# Patient Record
Sex: Female | Born: 1950
Health system: Southern US, Community
[De-identification: ages and names within clinical notes are randomized; demographics above are authoritative.]

## PROBLEM LIST (undated history)

## (undated) ENCOUNTER — Emergency Department (HOSPITAL_COMMUNITY): Admission: EM

## (undated) DIAGNOSIS — T7840XA Allergy, unspecified, initial encounter: Secondary | ICD-10-CM

## (undated) DIAGNOSIS — G43909 Migraine, unspecified, not intractable, without status migrainosus: Secondary | ICD-10-CM

## (undated) DIAGNOSIS — J45909 Unspecified asthma, uncomplicated: Secondary | ICD-10-CM

## (undated) DIAGNOSIS — I10 Essential (primary) hypertension: Secondary | ICD-10-CM

## (undated) DIAGNOSIS — M159 Polyosteoarthritis, unspecified: Secondary | ICD-10-CM

## (undated) DIAGNOSIS — H409 Unspecified glaucoma: Secondary | ICD-10-CM

## (undated) DIAGNOSIS — J479 Bronchiectasis, uncomplicated: Secondary | ICD-10-CM

## (undated) DIAGNOSIS — K219 Gastro-esophageal reflux disease without esophagitis: Secondary | ICD-10-CM

## (undated) DIAGNOSIS — D573 Sickle-cell trait: Secondary | ICD-10-CM

## (undated) DIAGNOSIS — H269 Unspecified cataract: Secondary | ICD-10-CM

## (undated) DIAGNOSIS — Z86010 Personal history of colonic polyps: Secondary | ICD-10-CM

## (undated) DIAGNOSIS — R809 Proteinuria, unspecified: Secondary | ICD-10-CM

## (undated) DIAGNOSIS — E663 Overweight: Secondary | ICD-10-CM

## (undated) DIAGNOSIS — J302 Other seasonal allergic rhinitis: Secondary | ICD-10-CM

## (undated) DIAGNOSIS — L509 Urticaria, unspecified: Secondary | ICD-10-CM

## (undated) DIAGNOSIS — N399 Disorder of urinary system, unspecified: Secondary | ICD-10-CM

## (undated) DIAGNOSIS — H35033 Hypertensive retinopathy, bilateral: Secondary | ICD-10-CM

## (undated) DIAGNOSIS — Z227 Latent tuberculosis: Secondary | ICD-10-CM

## (undated) DIAGNOSIS — E1129 Type 2 diabetes mellitus with other diabetic kidney complication: Secondary | ICD-10-CM

## (undated) DIAGNOSIS — L280 Lichen simplex chronicus: Secondary | ICD-10-CM

## (undated) DIAGNOSIS — F17200 Nicotine dependence, unspecified, uncomplicated: Secondary | ICD-10-CM

## (undated) DIAGNOSIS — Z720 Tobacco use: Secondary | ICD-10-CM

## (undated) DIAGNOSIS — D571 Sickle-cell disease without crisis: Secondary | ICD-10-CM

## (undated) DIAGNOSIS — J3089 Other allergic rhinitis: Secondary | ICD-10-CM

## (undated) DIAGNOSIS — D128 Benign neoplasm of rectum: Secondary | ICD-10-CM

## (undated) DIAGNOSIS — G5621 Lesion of ulnar nerve, right upper limb: Secondary | ICD-10-CM

## (undated) HISTORY — DX: Latent tuberculosis: Z22.7

## (undated) HISTORY — DX: Unspecified glaucoma: H40.9

## (undated) HISTORY — DX: Benign neoplasm of rectum: D12.8

## (undated) HISTORY — DX: Other allergic rhinitis: J30.89

## (undated) HISTORY — PX: CATARACT EXTRACTION, BILATERAL: SHX1313

## (undated) HISTORY — PX: TUBAL LIGATION: SHX77

## (undated) HISTORY — DX: Allergy, unspecified, initial encounter: T78.40XA

## (undated) HISTORY — PX: APPENDECTOMY: SHX54

## (undated) HISTORY — DX: Personal history of colonic polyps: Z86.010

## (undated) HISTORY — DX: Overweight: E66.3

## (undated) HISTORY — DX: Unspecified cataract: H26.9

## (undated) HISTORY — DX: Essential (primary) hypertension: I10

## (undated) HISTORY — DX: Urticaria, unspecified: L50.9

## (undated) HISTORY — DX: Sickle-cell disease without crisis: D57.1

## (undated) HISTORY — DX: Polyosteoarthritis, unspecified: M15.9

## (undated) HISTORY — DX: Unspecified asthma, uncomplicated: J45.909

## (undated) HISTORY — PX: TONSILLECTOMY: SUR1361

## (undated) HISTORY — DX: Gastro-esophageal reflux disease without esophagitis: K21.9

## (undated) HISTORY — DX: Hypertensive retinopathy, bilateral: H35.033

## (undated) HISTORY — DX: Lesion of ulnar nerve, right upper limb: G56.21

## (undated) HISTORY — DX: Bronchiectasis, uncomplicated: J47.9

## (undated) HISTORY — DX: Proteinuria, unspecified: R80.9

## (undated) HISTORY — DX: Tobacco use: Z72.0

## (undated) HISTORY — DX: Other seasonal allergic rhinitis: J30.2

## (undated) HISTORY — DX: Sickle-cell trait: D57.3

## (undated) HISTORY — DX: Type 2 diabetes mellitus with other diabetic kidney complication: E11.29

## (undated) HISTORY — DX: Disorder of urinary system, unspecified: N39.9

## (undated) HISTORY — DX: Migraine, unspecified, not intractable, without status migrainosus: G43.909

## (undated) HISTORY — DX: Lichen simplex chronicus: L28.0

---

## 1997-10-12 ENCOUNTER — Inpatient Hospital Stay (HOSPITAL_COMMUNITY): Admission: AD | Admit: 1997-10-12 | Discharge: 1997-10-12 | Payer: Self-pay | Admitting: Obstetrics & Gynecology

## 1998-08-28 ENCOUNTER — Ambulatory Visit (HOSPITAL_COMMUNITY): Admission: RE | Admit: 1998-08-28 | Discharge: 1998-08-28 | Payer: Self-pay | Admitting: Family Medicine

## 1998-10-11 ENCOUNTER — Ambulatory Visit (HOSPITAL_COMMUNITY): Admission: RE | Admit: 1998-10-11 | Discharge: 1998-10-11 | Payer: Self-pay | Admitting: Obstetrics and Gynecology

## 1999-04-11 ENCOUNTER — Emergency Department (HOSPITAL_COMMUNITY): Admission: EM | Admit: 1999-04-11 | Discharge: 1999-04-11 | Payer: Self-pay | Admitting: Emergency Medicine

## 1999-04-11 ENCOUNTER — Encounter: Payer: Self-pay | Admitting: Emergency Medicine

## 1999-10-10 ENCOUNTER — Emergency Department (HOSPITAL_COMMUNITY): Admission: EM | Admit: 1999-10-10 | Discharge: 1999-10-10 | Payer: Self-pay | Admitting: Emergency Medicine

## 1999-10-16 ENCOUNTER — Ambulatory Visit (HOSPITAL_COMMUNITY): Admission: RE | Admit: 1999-10-16 | Discharge: 1999-10-16 | Payer: Self-pay | Admitting: Family Medicine

## 1999-12-10 ENCOUNTER — Emergency Department (HOSPITAL_COMMUNITY): Admission: EM | Admit: 1999-12-10 | Discharge: 1999-12-10 | Payer: Self-pay

## 1999-12-10 ENCOUNTER — Encounter: Payer: Self-pay | Admitting: Emergency Medicine

## 2000-08-05 ENCOUNTER — Encounter: Payer: Self-pay | Admitting: Pulmonary Disease

## 2000-08-05 ENCOUNTER — Ambulatory Visit (HOSPITAL_COMMUNITY): Admission: RE | Admit: 2000-08-05 | Discharge: 2000-08-05 | Payer: Self-pay | Admitting: Pulmonary Disease

## 2001-02-02 ENCOUNTER — Emergency Department (HOSPITAL_COMMUNITY): Admission: EM | Admit: 2001-02-02 | Discharge: 2001-02-03 | Payer: Self-pay | Admitting: Emergency Medicine

## 2001-10-14 ENCOUNTER — Encounter: Admission: RE | Admit: 2001-10-14 | Discharge: 2001-10-14 | Payer: Self-pay | Admitting: Obstetrics and Gynecology

## 2001-10-14 ENCOUNTER — Encounter (INDEPENDENT_AMBULATORY_CARE_PROVIDER_SITE_OTHER): Payer: Self-pay | Admitting: *Deleted

## 2002-06-16 ENCOUNTER — Encounter: Admission: RE | Admit: 2002-06-16 | Discharge: 2002-06-16 | Payer: Self-pay | Admitting: Internal Medicine

## 2002-10-05 ENCOUNTER — Ambulatory Visit (HOSPITAL_COMMUNITY): Admission: RE | Admit: 2002-10-05 | Discharge: 2002-10-05 | Payer: Self-pay | Admitting: Internal Medicine

## 2002-10-05 ENCOUNTER — Encounter: Payer: Self-pay | Admitting: Internal Medicine

## 2002-10-05 ENCOUNTER — Encounter: Admission: RE | Admit: 2002-10-05 | Discharge: 2002-10-05 | Payer: Self-pay | Admitting: Internal Medicine

## 2002-10-17 ENCOUNTER — Encounter: Admission: RE | Admit: 2002-10-17 | Discharge: 2002-10-17 | Payer: Self-pay | Admitting: Internal Medicine

## 2002-10-31 ENCOUNTER — Encounter: Admission: RE | Admit: 2002-10-31 | Discharge: 2002-10-31 | Payer: Self-pay | Admitting: Internal Medicine

## 2003-01-04 ENCOUNTER — Encounter: Admission: RE | Admit: 2003-01-04 | Discharge: 2003-01-04 | Payer: Self-pay | Admitting: Infectious Diseases

## 2003-02-10 ENCOUNTER — Encounter: Admission: RE | Admit: 2003-02-10 | Discharge: 2003-02-10 | Payer: Self-pay | Admitting: Internal Medicine

## 2003-03-07 ENCOUNTER — Encounter: Admission: RE | Admit: 2003-03-07 | Discharge: 2003-06-05 | Payer: Self-pay | Admitting: Internal Medicine

## 2003-03-09 ENCOUNTER — Encounter: Admission: RE | Admit: 2003-03-09 | Discharge: 2003-03-09 | Payer: Self-pay | Admitting: Internal Medicine

## 2003-03-10 ENCOUNTER — Ambulatory Visit (HOSPITAL_COMMUNITY): Admission: RE | Admit: 2003-03-10 | Discharge: 2003-03-10 | Payer: Self-pay | Admitting: *Deleted

## 2003-03-10 ENCOUNTER — Encounter: Payer: Self-pay | Admitting: Nephrology

## 2003-04-11 ENCOUNTER — Emergency Department (HOSPITAL_COMMUNITY): Admission: EM | Admit: 2003-04-11 | Discharge: 2003-04-12 | Payer: Self-pay | Admitting: Emergency Medicine

## 2003-05-01 ENCOUNTER — Encounter: Admission: RE | Admit: 2003-05-01 | Discharge: 2003-05-01 | Payer: Self-pay | Admitting: Internal Medicine

## 2003-12-06 ENCOUNTER — Emergency Department (HOSPITAL_COMMUNITY): Admission: EM | Admit: 2003-12-06 | Discharge: 2003-12-06 | Payer: Self-pay | Admitting: Family Medicine

## 2003-12-14 ENCOUNTER — Encounter: Admission: RE | Admit: 2003-12-14 | Discharge: 2003-12-14 | Payer: Self-pay | Admitting: Internal Medicine

## 2003-12-22 ENCOUNTER — Encounter: Admission: RE | Admit: 2003-12-22 | Discharge: 2003-12-22 | Payer: Self-pay | Admitting: Internal Medicine

## 2004-01-01 ENCOUNTER — Emergency Department (HOSPITAL_COMMUNITY): Admission: EM | Admit: 2004-01-01 | Discharge: 2004-01-01 | Payer: Self-pay | Admitting: Family Medicine

## 2004-01-05 ENCOUNTER — Encounter: Admission: RE | Admit: 2004-01-05 | Discharge: 2004-01-05 | Payer: Self-pay | Admitting: Internal Medicine

## 2004-02-05 ENCOUNTER — Ambulatory Visit: Payer: Self-pay | Admitting: Internal Medicine

## 2004-02-21 ENCOUNTER — Ambulatory Visit: Payer: Self-pay | Admitting: Internal Medicine

## 2004-03-04 ENCOUNTER — Ambulatory Visit: Payer: Self-pay | Admitting: Internal Medicine

## 2004-03-11 ENCOUNTER — Ambulatory Visit (HOSPITAL_COMMUNITY): Admission: RE | Admit: 2004-03-11 | Discharge: 2004-03-11 | Payer: Self-pay | Admitting: Internal Medicine

## 2004-05-13 ENCOUNTER — Ambulatory Visit: Payer: Self-pay | Admitting: Internal Medicine

## 2004-07-11 ENCOUNTER — Ambulatory Visit: Payer: Self-pay | Admitting: Internal Medicine

## 2004-08-21 ENCOUNTER — Ambulatory Visit: Payer: Self-pay | Admitting: Internal Medicine

## 2004-08-23 ENCOUNTER — Ambulatory Visit (HOSPITAL_COMMUNITY): Admission: RE | Admit: 2004-08-23 | Discharge: 2004-08-23 | Payer: Self-pay | Admitting: Internal Medicine

## 2004-08-30 ENCOUNTER — Ambulatory Visit: Payer: Self-pay | Admitting: Sports Medicine

## 2004-09-11 ENCOUNTER — Ambulatory Visit: Payer: Self-pay | Admitting: Internal Medicine

## 2004-10-28 ENCOUNTER — Ambulatory Visit: Payer: Self-pay | Admitting: Internal Medicine

## 2004-11-07 ENCOUNTER — Ambulatory Visit: Payer: Self-pay | Admitting: Internal Medicine

## 2004-11-11 ENCOUNTER — Emergency Department (HOSPITAL_COMMUNITY): Admission: EM | Admit: 2004-11-11 | Discharge: 2004-11-11 | Payer: Self-pay | Admitting: Emergency Medicine

## 2005-02-20 ENCOUNTER — Ambulatory Visit: Payer: Self-pay | Admitting: Internal Medicine

## 2005-04-10 ENCOUNTER — Ambulatory Visit: Payer: Self-pay | Admitting: Internal Medicine

## 2005-06-16 ENCOUNTER — Ambulatory Visit: Payer: Self-pay | Admitting: Hospitalist

## 2005-06-16 ENCOUNTER — Encounter (INDEPENDENT_AMBULATORY_CARE_PROVIDER_SITE_OTHER): Payer: Self-pay | Admitting: Infectious Diseases

## 2005-06-23 ENCOUNTER — Ambulatory Visit: Payer: Self-pay | Admitting: Internal Medicine

## 2005-07-09 ENCOUNTER — Encounter: Admission: RE | Admit: 2005-07-09 | Discharge: 2005-08-01 | Payer: Self-pay | Admitting: Orthopedic Surgery

## 2005-07-21 ENCOUNTER — Ambulatory Visit: Payer: Self-pay | Admitting: Internal Medicine

## 2005-08-27 ENCOUNTER — Ambulatory Visit (HOSPITAL_COMMUNITY): Admission: RE | Admit: 2005-08-27 | Discharge: 2005-08-27 | Payer: Self-pay | Admitting: Emergency Medicine

## 2005-09-19 ENCOUNTER — Ambulatory Visit: Payer: Self-pay | Admitting: Internal Medicine

## 2005-09-19 ENCOUNTER — Ambulatory Visit (HOSPITAL_COMMUNITY): Admission: RE | Admit: 2005-09-19 | Discharge: 2005-09-19 | Payer: Self-pay | Admitting: Internal Medicine

## 2005-09-19 ENCOUNTER — Encounter (INDEPENDENT_AMBULATORY_CARE_PROVIDER_SITE_OTHER): Payer: Self-pay | Admitting: *Deleted

## 2005-11-07 ENCOUNTER — Ambulatory Visit: Payer: Self-pay | Admitting: Internal Medicine

## 2006-03-11 ENCOUNTER — Ambulatory Visit: Payer: Self-pay | Admitting: Internal Medicine

## 2006-03-18 ENCOUNTER — Ambulatory Visit: Payer: Self-pay | Admitting: Internal Medicine

## 2006-03-18 ENCOUNTER — Encounter (INDEPENDENT_AMBULATORY_CARE_PROVIDER_SITE_OTHER): Payer: Self-pay | Admitting: Infectious Diseases

## 2006-03-18 LAB — CONVERTED CEMR LAB
Chloride: 105 meq/L (ref 96–112)
Potassium: 4.2 meq/L (ref 3.5–5.3)
Sodium: 136 meq/L (ref 135–145)

## 2006-05-15 ENCOUNTER — Ambulatory Visit: Payer: Self-pay | Admitting: Internal Medicine

## 2006-05-21 ENCOUNTER — Encounter (INDEPENDENT_AMBULATORY_CARE_PROVIDER_SITE_OTHER): Payer: Self-pay | Admitting: Infectious Diseases

## 2006-05-21 DIAGNOSIS — E1129 Type 2 diabetes mellitus with other diabetic kidney complication: Secondary | ICD-10-CM

## 2006-05-21 DIAGNOSIS — M159 Polyosteoarthritis, unspecified: Secondary | ICD-10-CM

## 2006-05-21 DIAGNOSIS — IMO0002 Reserved for concepts with insufficient information to code with codable children: Secondary | ICD-10-CM | POA: Insufficient documentation

## 2006-05-21 DIAGNOSIS — R809 Proteinuria, unspecified: Secondary | ICD-10-CM

## 2006-05-21 DIAGNOSIS — M7710 Lateral epicondylitis, unspecified elbow: Secondary | ICD-10-CM | POA: Insufficient documentation

## 2006-05-21 DIAGNOSIS — E1165 Type 2 diabetes mellitus with hyperglycemia: Secondary | ICD-10-CM | POA: Insufficient documentation

## 2006-05-21 DIAGNOSIS — Z9189 Other specified personal risk factors, not elsewhere classified: Secondary | ICD-10-CM | POA: Insufficient documentation

## 2006-05-21 HISTORY — DX: Type 2 diabetes mellitus with other diabetic kidney complication: E11.29

## 2006-05-21 HISTORY — DX: Polyosteoarthritis, unspecified: M15.9

## 2006-06-09 DIAGNOSIS — J302 Other seasonal allergic rhinitis: Secondary | ICD-10-CM

## 2006-06-09 DIAGNOSIS — J45909 Unspecified asthma, uncomplicated: Secondary | ICD-10-CM

## 2006-06-09 DIAGNOSIS — Z720 Tobacco use: Secondary | ICD-10-CM | POA: Insufficient documentation

## 2006-06-09 DIAGNOSIS — J4520 Mild intermittent asthma, uncomplicated: Secondary | ICD-10-CM | POA: Insufficient documentation

## 2006-06-09 DIAGNOSIS — J3089 Other allergic rhinitis: Secondary | ICD-10-CM | POA: Insufficient documentation

## 2006-06-09 HISTORY — DX: Other allergic rhinitis: J30.2

## 2006-06-09 HISTORY — DX: Tobacco use: Z72.0

## 2006-06-09 HISTORY — DX: Unspecified asthma, uncomplicated: J45.909

## 2006-07-03 ENCOUNTER — Telehealth: Payer: Self-pay | Admitting: *Deleted

## 2006-07-22 ENCOUNTER — Telehealth: Payer: Self-pay | Admitting: *Deleted

## 2006-08-04 ENCOUNTER — Ambulatory Visit: Payer: Self-pay | Admitting: Internal Medicine

## 2006-08-04 ENCOUNTER — Encounter (INDEPENDENT_AMBULATORY_CARE_PROVIDER_SITE_OTHER): Payer: Self-pay | Admitting: Infectious Diseases

## 2006-08-04 LAB — CONVERTED CEMR LAB
ALT: 16 units/L (ref 0–35)
AST: 11 units/L (ref 0–37)
Alkaline Phosphatase: 45 units/L (ref 39–117)
Bilirubin, Direct: <0.1 mg/dL (ref 0.0–0.3)
Blood Glucose, Fingerstick: 193
Calcium: 9.5 mg/dL (ref 8.4–10.5)
Glucose, Bld: 173 mg/dL — ABNORMAL HIGH (ref 70–99)
Hgb A1c MFr Bld: 8.2 %
Potassium: 3.8 meq/L (ref 3.5–5.3)
Total Bilirubin: 0.6 mg/dL (ref 0.3–1.2)

## 2006-08-11 ENCOUNTER — Ambulatory Visit: Payer: Self-pay | Admitting: Internal Medicine

## 2007-01-11 ENCOUNTER — Encounter (INDEPENDENT_AMBULATORY_CARE_PROVIDER_SITE_OTHER): Payer: Self-pay | Admitting: Infectious Diseases

## 2007-01-11 ENCOUNTER — Ambulatory Visit: Payer: Self-pay | Admitting: Internal Medicine

## 2007-01-11 LAB — CONVERTED CEMR LAB: Hgb A1c MFr Bld: 7.2 %

## 2007-01-12 ENCOUNTER — Encounter (INDEPENDENT_AMBULATORY_CARE_PROVIDER_SITE_OTHER): Payer: Self-pay | Admitting: Infectious Diseases

## 2007-01-12 LAB — CONVERTED CEMR LAB
BUN: 14 mg/dL (ref 6–23)
CO2: 20 meq/L (ref 19–32)
Calcium: 9.9 mg/dL (ref 8.4–10.5)
Chloride: 106 meq/L (ref 96–112)
Creatinine, Ser: 0.56 mg/dL (ref 0.40–1.20)
Glucose, Bld: 112 mg/dL — ABNORMAL HIGH (ref 70–99)
Potassium: 4 meq/L (ref 3.5–5.3)
Sodium: 138 meq/L (ref 135–145)
Total Bilirubin: 0.3 mg/dL (ref 0.3–1.2)

## 2007-01-16 LAB — CONVERTED CEMR LAB: OCCULT 2: NEGATIVE

## 2007-01-18 ENCOUNTER — Encounter (INDEPENDENT_AMBULATORY_CARE_PROVIDER_SITE_OTHER): Payer: Self-pay | Admitting: Infectious Diseases

## 2007-01-18 ENCOUNTER — Ambulatory Visit: Payer: Self-pay | Admitting: Internal Medicine

## 2007-01-18 LAB — CONVERTED CEMR LAB
Cholesterol: 133 mg/dL (ref 0–200)
LDL Cholesterol: 71 mg/dL (ref 0–99)
Total CHOL/HDL Ratio: 3

## 2007-01-22 ENCOUNTER — Ambulatory Visit (HOSPITAL_COMMUNITY): Admission: RE | Admit: 2007-01-22 | Discharge: 2007-01-22 | Payer: Self-pay | Admitting: Infectious Diseases

## 2007-04-13 ENCOUNTER — Encounter (INDEPENDENT_AMBULATORY_CARE_PROVIDER_SITE_OTHER): Payer: Self-pay | Admitting: Infectious Diseases

## 2007-05-04 ENCOUNTER — Ambulatory Visit: Payer: Self-pay | Admitting: Internal Medicine

## 2007-05-04 LAB — CONVERTED CEMR LAB: Hgb A1c MFr Bld: 7 %

## 2007-05-13 ENCOUNTER — Telehealth (INDEPENDENT_AMBULATORY_CARE_PROVIDER_SITE_OTHER): Payer: Self-pay | Admitting: Infectious Diseases

## 2007-05-14 ENCOUNTER — Encounter (INDEPENDENT_AMBULATORY_CARE_PROVIDER_SITE_OTHER): Payer: Self-pay | Admitting: Infectious Diseases

## 2007-05-17 ENCOUNTER — Encounter (INDEPENDENT_AMBULATORY_CARE_PROVIDER_SITE_OTHER): Payer: Self-pay | Admitting: Infectious Diseases

## 2007-06-01 ENCOUNTER — Telehealth (INDEPENDENT_AMBULATORY_CARE_PROVIDER_SITE_OTHER): Payer: Self-pay | Admitting: Infectious Diseases

## 2007-06-01 ENCOUNTER — Encounter (INDEPENDENT_AMBULATORY_CARE_PROVIDER_SITE_OTHER): Payer: Self-pay | Admitting: Infectious Diseases

## 2007-06-10 ENCOUNTER — Encounter (INDEPENDENT_AMBULATORY_CARE_PROVIDER_SITE_OTHER): Payer: Self-pay | Admitting: Infectious Diseases

## 2007-07-07 ENCOUNTER — Ambulatory Visit: Payer: Self-pay | Admitting: Internal Medicine

## 2007-07-07 ENCOUNTER — Encounter (INDEPENDENT_AMBULATORY_CARE_PROVIDER_SITE_OTHER): Payer: Self-pay | Admitting: Infectious Diseases

## 2007-07-07 DIAGNOSIS — L989 Disorder of the skin and subcutaneous tissue, unspecified: Secondary | ICD-10-CM | POA: Insufficient documentation

## 2007-07-07 LAB — CONVERTED CEMR LAB
CO2: 21 meq/L (ref 19–32)
Chloride: 107 meq/L (ref 96–112)
Potassium: 4 meq/L (ref 3.5–5.3)

## 2007-07-14 ENCOUNTER — Encounter (INDEPENDENT_AMBULATORY_CARE_PROVIDER_SITE_OTHER): Payer: Self-pay | Admitting: Infectious Diseases

## 2008-01-13 ENCOUNTER — Encounter: Payer: Self-pay | Admitting: Internal Medicine

## 2008-01-13 ENCOUNTER — Telehealth (INDEPENDENT_AMBULATORY_CARE_PROVIDER_SITE_OTHER): Payer: Self-pay | Admitting: Pharmacy Technician

## 2008-01-13 ENCOUNTER — Ambulatory Visit: Payer: Self-pay | Admitting: *Deleted

## 2008-01-13 LAB — CONVERTED CEMR LAB
Anti Nuclear Antibody(ANA): NEGATIVE
Creatinine, Urine: 67.2 mg/dL
HDL: 47 mg/dL (ref >39)
Hgb A1c MFr Bld: 8.4 %
LDL Cholesterol: 60 mg/dL (ref 0–99)
Sed Rate: 14 mm/hr (ref 0–22)
VLDL: 26 mg/dL (ref 0–40)

## 2008-01-25 ENCOUNTER — Ambulatory Visit: Payer: Self-pay | Admitting: Internal Medicine

## 2008-01-25 ENCOUNTER — Encounter (INDEPENDENT_AMBULATORY_CARE_PROVIDER_SITE_OTHER): Payer: Self-pay | Admitting: *Deleted

## 2008-01-25 DIAGNOSIS — R079 Chest pain, unspecified: Secondary | ICD-10-CM | POA: Insufficient documentation

## 2008-01-25 DIAGNOSIS — N951 Menopausal and female climacteric states: Secondary | ICD-10-CM | POA: Insufficient documentation

## 2008-01-25 LAB — CONVERTED CEMR LAB
Blood Glucose, Fingerstick: 168
Chloride: 108 meq/L (ref 96–112)
Glucose, Bld: 91 mg/dL (ref 70–99)
Potassium: 4.4 meq/L (ref 3.5–5.3)

## 2008-01-28 ENCOUNTER — Ambulatory Visit: Payer: Self-pay | Admitting: Cardiology

## 2008-02-02 ENCOUNTER — Encounter (INDEPENDENT_AMBULATORY_CARE_PROVIDER_SITE_OTHER): Payer: Self-pay | Admitting: *Deleted

## 2008-02-03 ENCOUNTER — Ambulatory Visit (HOSPITAL_COMMUNITY): Admission: RE | Admit: 2008-02-03 | Discharge: 2008-02-03 | Payer: Self-pay | Admitting: *Deleted

## 2008-03-10 ENCOUNTER — Encounter (INDEPENDENT_AMBULATORY_CARE_PROVIDER_SITE_OTHER): Payer: Self-pay | Admitting: *Deleted

## 2008-03-14 ENCOUNTER — Ambulatory Visit: Payer: Self-pay | Admitting: Internal Medicine

## 2008-03-16 ENCOUNTER — Encounter (INDEPENDENT_AMBULATORY_CARE_PROVIDER_SITE_OTHER): Payer: Self-pay | Admitting: *Deleted

## 2008-03-17 ENCOUNTER — Ambulatory Visit (HOSPITAL_COMMUNITY): Admission: RE | Admit: 2008-03-17 | Discharge: 2008-03-17 | Payer: Self-pay | Admitting: Internal Medicine

## 2008-03-17 DIAGNOSIS — Z227 Latent tuberculosis: Secondary | ICD-10-CM | POA: Insufficient documentation

## 2008-03-17 DIAGNOSIS — A150 Tuberculosis of lung: Secondary | ICD-10-CM | POA: Insufficient documentation

## 2008-03-17 DIAGNOSIS — J984 Other disorders of lung: Secondary | ICD-10-CM | POA: Insufficient documentation

## 2008-03-20 ENCOUNTER — Encounter (INDEPENDENT_AMBULATORY_CARE_PROVIDER_SITE_OTHER): Payer: Self-pay | Admitting: *Deleted

## 2008-03-20 ENCOUNTER — Ambulatory Visit: Payer: Self-pay | Admitting: Internal Medicine

## 2008-03-21 ENCOUNTER — Ambulatory Visit (HOSPITAL_COMMUNITY): Admission: RE | Admit: 2008-03-21 | Discharge: 2008-03-21 | Payer: Self-pay | Admitting: Internal Medicine

## 2008-03-29 ENCOUNTER — Ambulatory Visit: Payer: Self-pay | Admitting: Infectious Diseases

## 2008-03-29 ENCOUNTER — Encounter (INDEPENDENT_AMBULATORY_CARE_PROVIDER_SITE_OTHER): Payer: Self-pay | Admitting: *Deleted

## 2008-03-29 DIAGNOSIS — D376 Neoplasm of uncertain behavior of liver, gallbladder and bile ducts: Secondary | ICD-10-CM | POA: Insufficient documentation

## 2008-03-29 LAB — CONVERTED CEMR LAB
AST: 13 units/L (ref 0–37)
Albumin: 4.3 g/dL (ref 3.5–5.2)
Blood Glucose, Fingerstick: 213
CO2: 24 meq/L (ref 19–32)
Calcium: 9.7 mg/dL (ref 8.4–10.5)
Creatinine, Ser: 0.57 mg/dL (ref 0.40–1.20)
Glucose, Bld: 105 mg/dL — ABNORMAL HIGH (ref 70–99)
Hep B S Ab: NEGATIVE
Hepatitis B Surface Ag: NEGATIVE
Hgb A1c MFr Bld: 7.6 %
Total Bilirubin: 0.3 mg/dL (ref 0.3–1.2)

## 2008-04-10 ENCOUNTER — Encounter (INDEPENDENT_AMBULATORY_CARE_PROVIDER_SITE_OTHER): Payer: Self-pay | Admitting: *Deleted

## 2008-04-10 ENCOUNTER — Ambulatory Visit: Payer: Self-pay

## 2008-04-10 ENCOUNTER — Encounter: Payer: Self-pay | Admitting: Cardiology

## 2008-05-10 ENCOUNTER — Encounter (INDEPENDENT_AMBULATORY_CARE_PROVIDER_SITE_OTHER): Payer: Self-pay | Admitting: *Deleted

## 2008-07-05 ENCOUNTER — Ambulatory Visit: Payer: Self-pay | Admitting: Internal Medicine

## 2008-07-12 ENCOUNTER — Emergency Department (HOSPITAL_COMMUNITY): Admission: EM | Admit: 2008-07-12 | Discharge: 2008-07-13 | Payer: Self-pay | Admitting: Emergency Medicine

## 2008-07-31 ENCOUNTER — Telehealth (INDEPENDENT_AMBULATORY_CARE_PROVIDER_SITE_OTHER): Payer: Self-pay | Admitting: *Deleted

## 2008-07-31 HISTORY — PX: FOOT SURGERY: SHX648

## 2008-08-01 ENCOUNTER — Encounter (INDEPENDENT_AMBULATORY_CARE_PROVIDER_SITE_OTHER): Payer: Self-pay | Admitting: *Deleted

## 2008-08-03 ENCOUNTER — Encounter (INDEPENDENT_AMBULATORY_CARE_PROVIDER_SITE_OTHER): Payer: Self-pay | Admitting: *Deleted

## 2008-09-11 ENCOUNTER — Telehealth: Payer: Self-pay | Admitting: Internal Medicine

## 2008-09-20 ENCOUNTER — Encounter (INDEPENDENT_AMBULATORY_CARE_PROVIDER_SITE_OTHER): Payer: Self-pay | Admitting: *Deleted

## 2008-09-20 ENCOUNTER — Ambulatory Visit (HOSPITAL_COMMUNITY): Admission: RE | Admit: 2008-09-20 | Discharge: 2008-09-20 | Payer: Self-pay | Admitting: Internal Medicine

## 2008-09-20 ENCOUNTER — Ambulatory Visit: Payer: Self-pay | Admitting: Internal Medicine

## 2008-09-22 DIAGNOSIS — E876 Hypokalemia: Secondary | ICD-10-CM | POA: Insufficient documentation

## 2008-09-22 LAB — CONVERTED CEMR LAB
Calcium: 9.2 mg/dL (ref 8.4–10.5)
Chloride: 106 meq/L (ref 96–112)
GFR calc non Af Amer: >60 mL/min (ref >60)
Potassium: 3.3 meq/L — ABNORMAL LOW (ref 3.5–5.3)

## 2008-10-11 ENCOUNTER — Encounter (INDEPENDENT_AMBULATORY_CARE_PROVIDER_SITE_OTHER): Payer: Self-pay | Admitting: *Deleted

## 2008-10-16 ENCOUNTER — Encounter (INDEPENDENT_AMBULATORY_CARE_PROVIDER_SITE_OTHER): Payer: Self-pay | Admitting: *Deleted

## 2008-10-16 ENCOUNTER — Ambulatory Visit: Payer: Self-pay | Admitting: *Deleted

## 2008-10-16 LAB — CONVERTED CEMR LAB: Hgb A1c MFr Bld: 7 %

## 2008-10-17 LAB — CONVERTED CEMR LAB
Barbiturate Quant, Ur: NEGATIVE
Cocaine Metabolites: NEGATIVE
Methadone: NEGATIVE
Phencyclidine (PCP): NEGATIVE
Propoxyphene: NEGATIVE

## 2008-11-01 ENCOUNTER — Encounter: Admission: RE | Admit: 2008-11-01 | Discharge: 2009-01-12 | Payer: Self-pay | Admitting: *Deleted

## 2008-11-03 ENCOUNTER — Ambulatory Visit: Payer: Self-pay | Admitting: Internal Medicine

## 2008-11-08 ENCOUNTER — Encounter (INDEPENDENT_AMBULATORY_CARE_PROVIDER_SITE_OTHER): Payer: Self-pay | Admitting: *Deleted

## 2008-11-13 ENCOUNTER — Encounter (INDEPENDENT_AMBULATORY_CARE_PROVIDER_SITE_OTHER): Payer: Self-pay | Admitting: *Deleted

## 2008-11-16 ENCOUNTER — Ambulatory Visit: Payer: Self-pay | Admitting: Internal Medicine

## 2008-11-16 ENCOUNTER — Encounter: Payer: Self-pay | Admitting: Internal Medicine

## 2008-11-16 DIAGNOSIS — D128 Benign neoplasm of rectum: Secondary | ICD-10-CM

## 2008-11-16 DIAGNOSIS — Z860101 Personal history of adenomatous and serrated colon polyps: Secondary | ICD-10-CM | POA: Insufficient documentation

## 2008-11-16 DIAGNOSIS — Z86010 Personal history of colonic polyps: Secondary | ICD-10-CM

## 2008-11-16 HISTORY — DX: Personal history of colonic polyps: Z86.010

## 2008-11-16 HISTORY — DX: Benign neoplasm of rectum: D12.8

## 2008-11-16 HISTORY — PX: COLONOSCOPY: SHX174

## 2008-11-16 HISTORY — DX: Personal history of adenomatous and serrated colon polyps: Z86.0101

## 2008-11-19 ENCOUNTER — Encounter: Payer: Self-pay | Admitting: Internal Medicine

## 2008-12-25 ENCOUNTER — Encounter: Payer: Self-pay | Admitting: Internal Medicine

## 2009-02-05 ENCOUNTER — Ambulatory Visit (HOSPITAL_COMMUNITY): Admission: RE | Admit: 2009-02-05 | Discharge: 2009-02-05 | Payer: Self-pay | Admitting: *Deleted

## 2009-03-01 ENCOUNTER — Telehealth: Payer: Self-pay | Admitting: Internal Medicine

## 2009-03-06 ENCOUNTER — Ambulatory Visit: Payer: Self-pay | Admitting: Internal Medicine

## 2009-03-06 DIAGNOSIS — B353 Tinea pedis: Secondary | ICD-10-CM | POA: Insufficient documentation

## 2009-03-06 LAB — CONVERTED CEMR LAB
Blood Glucose, Fingerstick: 224
Hgb A1c MFr Bld: 6.9 %

## 2009-03-15 ENCOUNTER — Encounter: Payer: Self-pay | Admitting: Internal Medicine

## 2009-07-04 ENCOUNTER — Telehealth (INDEPENDENT_AMBULATORY_CARE_PROVIDER_SITE_OTHER): Payer: Self-pay | Admitting: *Deleted

## 2009-07-13 ENCOUNTER — Encounter: Payer: Self-pay | Admitting: Internal Medicine

## 2009-08-02 ENCOUNTER — Telehealth: Payer: Self-pay | Admitting: Internal Medicine

## 2009-09-04 ENCOUNTER — Ambulatory Visit: Payer: Self-pay | Admitting: Internal Medicine

## 2009-09-04 LAB — CONVERTED CEMR LAB
ALT: 20 units/L (ref 0–35)
AST: 14 units/L (ref 0–37)
Alkaline Phosphatase: 55 units/L (ref 39–117)
CO2: 22 meq/L (ref 19–32)
Chloride: 106 meq/L (ref 96–112)
Cholesterol: 143 mg/dL (ref 0–200)
Glucose, Bld: 149 mg/dL — ABNORMAL HIGH (ref 70–99)
HDL: 49 mg/dL (ref >39)
Hgb A1c MFr Bld: 8 %
LDL Cholesterol: 77 mg/dL (ref 0–99)
Potassium: 4.1 meq/L (ref 3.5–5.3)
Total Bilirubin: 0.2 mg/dL — ABNORMAL LOW (ref 0.3–1.2)
Triglycerides: 84 mg/dL (ref <150)

## 2009-11-12 ENCOUNTER — Telehealth: Payer: Self-pay | Admitting: Internal Medicine

## 2010-02-28 ENCOUNTER — Telehealth: Payer: Self-pay | Admitting: Internal Medicine

## 2010-03-06 ENCOUNTER — Ambulatory Visit (HOSPITAL_COMMUNITY): Admission: RE | Admit: 2010-03-06 | Discharge: 2010-03-06 | Payer: Self-pay | Admitting: Family Medicine

## 2010-03-11 ENCOUNTER — Encounter: Payer: Self-pay | Admitting: Internal Medicine

## 2010-03-18 ENCOUNTER — Encounter: Payer: Self-pay | Admitting: Internal Medicine

## 2010-06-25 NOTE — Letter (Signed)
Summary: PHYSICIAN ORDER-DIABETIC SUPPLIES  PHYSICIAN ORDER-DIABETIC SUPPLIES   Imported By: Shon Hough 03/14/2010 14:44:36  _____________________________________________________________________  External Attachment:    Type:   Image     Comment:   External Document

## 2010-06-25 NOTE — Miscellaneous (Signed)
Summary: [facility]: Diabetic Testing [facility]: Diabetic Testing Supplies   Imported By: Florinda Marker 07/13/2009 14:47:31  _____________________________________________________________________  External Attachment:    Type:   Image     Comment:   External Document
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
## 2010-06-25 NOTE — Progress Notes (Signed)
Summary: refill/gg  Phone Note Refill Request  on August 02, 2009 3:51 PM  Refills Requested: Medication #1:  METFORMIN HCL 1000 MG TABS Take 1 tablet by mouth two times a day   Last Refilled: 12/23/2008  Method Requested: Electronic Initial call taken by: Merrie Roof RN,  August 02, 2009 3:51 PM  Follow-up for Phone Call       Follow-up by: Darnelle Maffucci MD,  August 03, 2009 6:31 AM    Prescriptions: METFORMIN HCL 1000 MG TABS (METFORMIN HCL) Take 1 tablet by mouth two times a day  #180 x 3   Entered and Authorized by:   Darnelle Maffucci MD   Signed by:   Darnelle Maffucci MD on 08/03/2009   Method used:   [facility]  [address] [id number]ail)       [address]       [place], [place]  [address]       Ph: [phone] or [phone]       Fax: [phone]   RxID:   [id number]

## 2010-06-25 NOTE — Letter (Signed)
Summary: Patient Notice- Polyp Results  [facility]  [address] [place], [place] [address]   Phone: [phone]  Fax: [phone]        November 19, 2008 MRN: [id number]    [name] [address] [place], [place]  [address]    Dear [name],  The polyp removed from your colon was adenomatous. This means that it was pre-cancerous or that  it had the potential to change into cancer over time.  I recommend that you have a repeat colonoscopy in 5 years to determine if you have developed any new polyps over time. If you develop any new rectal bleeding, abdominal pain or significant bowel habit changes, please contact us before then.     Sincerely,  Iva Boop MD  This letter has been electronically signed by your physician.

## 2010-06-25 NOTE — Progress Notes (Signed)
Summary: phone/gg  Phone Note Call from Patient   Caller: Patient Summary of Call: Pt called stating  she fell about 3 weeks ago , hurt left  leg and foot. [facility] went to [facility], told she had a sprained  ankle, and they informed her she needs to see her PCP for the leg injury. She is still having pain but is ambulatory. Our first appointment is   6/28. Please advise.  Pt # [id number] Initial call taken by: Merrie Roof RN,  November 12, 2009 5:01 PM  Follow-up for Phone Call        Can we get records?  It seems that a foot center would be able to handle a sprain.  I tried to call pt - got answering machine - to get further info.  Did they treat her?  Did they rec ice and NSAIDS and ACE bandage?  Did they want her seen emergently or just in a few weeks.  Is she improving?  Did they do X rays?   Follow-up by: Blanch Media MD,  November 12, 2009 5:04 PM  Additional Follow-up for Phone Call Additional follow up Details #1::        Pt called in today and states she saw Dr Duwaine Maxin @ [facility]. They  did treat her ankle, took xrays, she is using ice and taking IBU with some relief.    I called [facility] and asked for pt's record to be sent to [place].   Received a call from Dr Ralene Cork stating pt fell 3 weeks ago.  He treated her sprained ankle and gave her Rx for meloxicam, which he felt would also help her hip pain.  Pt was c/o hip pain with radiation to thigh and foot.  Pt is ambulatory and was told to f/u with [place] if not better. Will schedule appointment for 6/28  Additional Follow-up by: Merrie Roof RN,  November 13, 2009 9:43 AM    Additional Follow-up for Phone Call Additional follow up Details #2::    Thank you.  Appt 6/28 appropriate. Follow-up by: Blanch Media MD,  November 13, 2009 10:16 AM

## 2010-06-25 NOTE — Progress Notes (Signed)
Summary: refill/gg  Phone Note Refill Request  on February 28, 2010 4:47 PM  Refills Requested: Medication #1:  ACTOS 30 MG TABS 1 by mouth daily  Medication #2:  FLOVENT DISKUS 50 MCG/BLIST AEPB 1 puff inhaled two times a day  Medication #3:  FLUTICASONE PROPIONATE 50 MCG/ACT  SUSP 1-2 sprays each nostril once daily  Medication #4:  GLIPIZIDE XL 10 MG XR24H-TAB take 2 tablets by mouth daily  Method Requested: Electronic Initial call taken by: Merrie Roof RN,  February 28, 2010 4:47 PM  Follow-up for Phone Call       Follow-up by: Blanch Media MD,  March 01, 2010 10:52 AM    Prescriptions: FLOVENT DISKUS 50 MCG/BLIST AEPB (FLUTICASONE PROPIONATE (INHAL)) 1 puff inhaled two times a day  #1 MDI x 5   Entered and Authorized by:   Blanch Media MD   Signed by:   Blanch Media MD on 03/01/2010   Method used:   Faxed to ...       [facility] (mail-order)       [address]       [place], [place]  [address]       Ph: [phone]       Fax: [phone]   RxID:   [id number] FLUTICASONE PROPIONATE 50 MCG/ACT  SUSP (FLUTICASONE PROPIONATE) 1-2 sprays each nostril once daily  #1 vial x 5   Entered and Authorized by:   Blanch Media MD   Signed by:   Blanch Media MD on 03/01/2010   Method used:   Faxed to ...       [facility] (mail-order)       [address]       [place], [place]  [address]       Ph: [phone]       Fax: [phone]   RxID:   [id number] GLIPIZIDE XL 10 MG XR24H-TAB (GLIPIZIDE) take 2 tablets by mouth daily  #180 x 1   Entered and Authorized by:   Blanch Media MD   Signed by:   Blanch Media MD on 03/01/2010   Method used:   Faxed to ...       [facility] (mail-order)       [address]       [place], [place]  [address]       Ph: [phone]       Fax: [phone]   RxID:   [id number] ACTOS 30 MG TABS (PIOGLITAZONE HCL) 1 by mouth daily  #90 Tablet x 1  Entered and Authorized by:   Blanch Media MD   Signed by:   Blanch Media MD on 03/01/2010   Method used:   Faxed to ...       [facility] (mail-order)       [address]       [place], [place]  [address]       Ph: [phone]       Fax: [phone]   RxID:   [id number]

## 2010-06-25 NOTE — Progress Notes (Signed)
Summary: refill/gg  Phone Note Refill Request  on February 28, 2010 5:11 PM  Refills Requested: Medication #1:  METFORMIN HCL 1000 MG TABS Take 1 tablet by mouth two times a day  Medication #2:  PRODIGY AUTOCODE BLOOD GLUCOSE   STRP to test blood sugar once daily  Medication #3:  ALBUTEROL 90 MCG/ACT  AERS 1-2 puffs every 6 hrs as needed  Medication #4:  LANCETS   MISC to test blood sugar once daily  Method Requested: Electronic Initial call taken by: Merrie Roof RN,  February 28, 2010 5:12 PM  Follow-up for Phone Call        Last seen 4/11. Has appt later this month Follow-up by: Blanch Media MD,  March 01, 2010 9:51 AM    Prescriptions: PRODIGY AUTOCODE BLOOD GLUCOSE   STRP (GLUCOSE BLOOD) to test blood sugar once daily  #100 x 5   Entered and Authorized by:   Blanch Media MD   Signed by:   Blanch Media MD on 03/01/2010   Method used:   Faxed to ...       [facility] (mail-order)       [address]       [place], [place]  [address]       Ph: [phone]       Fax: [phone]   RxID:   [id number] ALBUTEROL 90 MCG/ACT  AERS (ALBUTEROL) 1-2 puffs every 6 hrs as needed  #1 MDI x 3   Entered and Authorized by:   Blanch Media MD   Signed by:   Blanch Media MD on 03/01/2010   Method used:   Faxed to ...       [facility] (mail-order)       [address]       [place], [place]  [address]       Ph: [phone]       Fax: [phone]   RxID:   [id number] LANCETS   MISC (LANCETS) to test blood sugar once daily  #100 x 5   Entered and Authorized by:   Blanch Media MD   Signed by:   Blanch Media MD on 03/01/2010   Method used:   Faxed to ...       [facility] (mail-order)       [address]       [place], [place]  [address]       Ph: [phone]       Fax: [phone]   RxID:   [id number] METFORMIN HCL 1000 MG TABS (METFORMIN HCL) Take 1 tablet by mouth two times a day   #180 x 1   Entered and Authorized by:   Blanch Media MD   Signed by:   Blanch Media MD on 03/01/2010   Method used:   Faxed to ...       [facility] (mail-order)       [address]       [place], [place]  [address]       Ph: [phone]       Fax: [phone]   RxID:   [id number]

## 2010-06-25 NOTE — Progress Notes (Signed)
Summary: diabetes testing supply company is [facility]-  Phone Note Outgoing Call   Call placed by: Jamison Neighbor RD,CDE,  July 04, 2009 2:04 PM Summary of Call: I spoke with Krista Sosa- she wants to use the [facility] for her diabetes testing supplies. She gave me verbal agreement to have [place] destroy other comnpanies' requests unless she notifies [place] that she wants to change mail order companies. Jamison Neighbor RD,CDE  July 04, 2009 2:04 PM

## 2010-06-25 NOTE — Letter (Signed)
Summary: [facility]   Imported By: Shon Hough 03/20/2010 11:02:48  _____________________________________________________________________  External Attachment:    Type:   Image     Comment:   External Document

## 2010-07-19 ENCOUNTER — Other Ambulatory Visit: Payer: Self-pay | Admitting: *Deleted

## 2010-07-19 DIAGNOSIS — J449 Chronic obstructive pulmonary disease, unspecified: Secondary | ICD-10-CM

## 2010-07-21 MED ORDER — ALBUTEROL SULFATE HFA 108 (90 BASE) MCG/ACT IN AERS: INHALATION_SPRAY | RESPIRATORY_TRACT | Status: DC

## 2010-07-30 ENCOUNTER — Encounter: Payer: Self-pay | Admitting: Internal Medicine

## 2010-08-14 LAB — GLUCOSE, CAPILLARY: Glucose-Capillary: 219 mg/dL — ABNORMAL HIGH (ref 70–99)

## 2010-08-15 ENCOUNTER — Other Ambulatory Visit: Payer: Self-pay | Admitting: *Deleted

## 2010-08-15 MED ORDER — METFORMIN HCL 1000 MG PO TABS: 1000.0000 mg | ORAL_TABLET | Freq: Two times a day (BID) | ORAL | Status: DC

## 2010-08-15 MED ORDER — GLIPIZIDE ER 10 MG PO TB24: 20.0000 mg | ORAL_TABLET | Freq: Every day | ORAL | Status: DC

## 2010-08-15 MED ORDER — PIOGLITAZONE HCL 30 MG PO TABS: 30.0000 mg | ORAL_TABLET | Freq: Every day | ORAL | Status: DC

## 2010-08-27 ENCOUNTER — Encounter: Payer: Self-pay | Admitting: Internal Medicine

## 2010-08-27 ENCOUNTER — Ambulatory Visit (INDEPENDENT_AMBULATORY_CARE_PROVIDER_SITE_OTHER): Payer: Medicaid Other | Admitting: Internal Medicine

## 2010-08-27 VITALS — BP 153/94 | HR 97 | Temp 98.7°F | Ht 67.0 in | Wt 174.9 lb

## 2010-08-27 DIAGNOSIS — G609 Hereditary and idiopathic neuropathy, unspecified: Secondary | ICD-10-CM

## 2010-08-27 DIAGNOSIS — F17200 Nicotine dependence, unspecified, uncomplicated: Secondary | ICD-10-CM

## 2010-08-27 DIAGNOSIS — E119 Type 2 diabetes mellitus without complications: Secondary | ICD-10-CM

## 2010-08-27 LAB — POCT GLYCOSYLATED HEMOGLOBIN (HGB A1C): Hemoglobin A1C: 10.4

## 2010-08-27 LAB — GLUCOSE, CAPILLARY: Glucose-Capillary: 311 mg/dL — ABNORMAL HIGH (ref 70–99)

## 2010-08-27 NOTE — Progress Notes (Signed)
  Subjective:    Patient ID: Krista Sosa, female    DOB: Oct 17, 1950, 60 y.o.   MRN: [id number]  HPI Patient is a 60 year old female with a past medical history listed below, presents to the outpatient clinic for diabetes followup and for forms related to her request to obtain power wheelchair. She claims that she is unable to walk for long distances and therefore would benefit from this power wheelchair. She was told about this device from the TV ad. Patient states that she has mild instability and she walks, and claims that last week she fell walking in her house. Patient is able to do daily activities by herself and is able to get up from the chair by herself does not require a walker.  Review of Systems  [all other systems reviewed and are negative       Objective:   Physical Exam  Constitutional: She is oriented to person, place, and time. She appears well-developed and well-nourished.  HENT:  Head: Normocephalic and atraumatic.  Eyes: EOM are normal. Pupils are equal, round, and reactive to light.  Neck: Normal range of motion. Neck supple. No JVD present. No thyromegaly present.  Cardiovascular: Normal rate, regular rhythm and normal heart sounds.  Exam reveals no gallop and no friction rub.   No murmur heard. Pulmonary/Chest: Effort normal and breath sounds normal. No respiratory distress. She has no wheezes. She has no rales.  Abdominal: Soft. Bowel sounds are normal. She exhibits no distension.  Musculoskeletal: Normal range of motion. She exhibits no edema and no tenderness.  Neurological: She is alert and oriented to person, place, and time. No cranial nerve deficit. Coordination normal.  Skin: No rash noted. No erythema.          Assessment & Plan:

## 2010-08-27 NOTE — Assessment & Plan Note (Signed)
Patient presents to the clinic today to fill out forms for power wheelchair, as she states her diabetic neuropathy is severe and sometimes she is barely able to walk. Based on my questioning and examination of this patient, I highly doubt that she would be eligible for powered wheelchair.

## 2010-08-27 NOTE — Assessment & Plan Note (Signed)
Today the patient was in her independence sufficient time to discuss her diabetes. Her A1c today is 10.4, this is a significant increase from last year were her A1c was 8. I'll see patient back in one week to discuss with her what we should do next to control her diabetes.

## 2010-08-27 NOTE — Assessment & Plan Note (Signed)
Counseling provided

## 2010-08-29 LAB — GLUCOSE, CAPILLARY: Glucose-Capillary: 224 mg/dL — ABNORMAL HIGH (ref 70–99)

## 2010-09-02 LAB — GLUCOSE, CAPILLARY
Glucose-Capillary: 132 mg/dL — ABNORMAL HIGH (ref 70–99)
Glucose-Capillary: 151 mg/dL — ABNORMAL HIGH (ref 70–99)

## 2010-09-06 ENCOUNTER — Ambulatory Visit: Payer: Medicare Other | Admitting: Internal Medicine

## 2010-09-12 ENCOUNTER — Other Ambulatory Visit (INDEPENDENT_AMBULATORY_CARE_PROVIDER_SITE_OTHER): Payer: Medicare Other | Admitting: *Deleted

## 2010-09-12 DIAGNOSIS — J309 Allergic rhinitis, unspecified: Secondary | ICD-10-CM

## 2010-09-12 DIAGNOSIS — J45909 Unspecified asthma, uncomplicated: Secondary | ICD-10-CM

## 2010-09-12 MED ORDER — FLUTICASONE PROPIONATE (INHAL) 50 MCG/BLIST IN AEPB: 1.0000 | INHALATION_SPRAY | Freq: Two times a day (BID) | RESPIRATORY_TRACT | Status: DC

## 2010-09-12 MED ORDER — FLUTICASONE PROPIONATE 50 MCG/ACT NA SUSP: 2.0000 | Freq: Every day | NASAL | Status: DC

## 2010-09-16 ENCOUNTER — Ambulatory Visit: Payer: Medicare Other | Admitting: Internal Medicine

## 2010-10-08 NOTE — Assessment & Plan Note (Signed)
[facility]                            CARDIOLOGY OFFICE NOTE   BRINLY, MAIETTA                    MRN:          [id number]  DATE:01/28/2008                            DOB:          1950-12-24    I was asked by Dr. Harriett Sine Phifer to evaluate Krista Sosa with chest  discomfort and shortness of breath.   Krista Sosa is a very pleasant 60 year old black female who has been  having sharp stabbing chest pain over her left breast particularly at  night associated with numbness and tingling in her left arm and down  into her hand.  She gets short of breath with it.  On one occasion, she  broke out into sweat.   Her history is significant for diabetes and tobacco use.  She smokes a  pack every 2 days.  She also drinks a fair amount of alcohol tin drinks  per day.   She denies any orthopnea, PND, or peripheral edema.  With exertion or  activity, she has no symptoms per se other than some shortness of  breath.   PAST MEDICAL HISTORY:  She has no history of dye allergy.  She has no  known drug allergies.   She is currently on glipizide 10 mg a day, hydroxyzine 10 mg 1-2  nightly, Actos 30 mg a day, Effexor XR 75 mg a day, metformin 1000 mg  p.o. b.i.d.   SURGICAL HISTORY:  She has had foot surgery in the past.   FAMILY HISTORY:  Negative for premature coronary artery disease.   SOCIAL HISTORY:  She has 5 children.  She has not listed marital status.  She lives in [place].   REVIEW OF SYSTEMS:  Other than some breathing problems, she is negative.  All review of systems were questioned.   PHYSICAL EXAMINATION:  GENERAL:  She is pleasant.  She looks older than  stated age.  VITAL SIGNS:  Her blood pressure is 126/70, her pulse 75 and regular,  her height is 5 feet 7 inches, she weighs 169 pounds.  HEENT:  Normocephalic, atraumatic.  PERRLA.  Extraocular movements  intact.  Sclerae are clear except injected.  Facial symmetry is normal.  Dentition is in need of repair.  NECK:  Carotid upstrokes were equal bilateral without bruits.  No JVD.  Thyroid is not enlarged.  Trachea is midline.  Thyroid is palpable, but  there is no nodules.  LUNGS:  Clear to auscultation and percussion.  HEART:  Nondisplaced PMI.  Normal S1 and S2.  ABDOMEN:  Soft, good bowel sounds.  No midline bruit.  No hepatomegaly.  EXTREMITIES:  No cyanosis, clubbing, or edema.  Pulses are intact.  NEURO:  Intact.   EKG is completely normal.   ASSESSMENT:  Krista Sosa symptoms are very atypical, although she  does have numbness in her arm, shortness of breath, and some  diaphoresis.  With her diabetes, tobacco use, and her age, I think she  needs to be screened for nonobstructive disease.  We will arrange for  her to have an exercise rest stress Myoview.  Of course, we talked about  secondary risk factor modification and following up with Dr. Harvie Junior.     Thomas C. Daleen Squibb, MD, [facility]  Electronically Signed    TCW/MedQ  DD: 01/28/2008  DT: 01/29/2008  Job #: [id number]

## 2010-10-10 ENCOUNTER — Other Ambulatory Visit (INDEPENDENT_AMBULATORY_CARE_PROVIDER_SITE_OTHER): Payer: Medicare Other | Admitting: *Deleted

## 2010-10-10 DIAGNOSIS — E119 Type 2 diabetes mellitus without complications: Secondary | ICD-10-CM

## 2010-10-10 NOTE — Telephone Encounter (Signed)
Please clarify dose.  Pt states she is only taking once a day.--per PPA

## 2010-10-11 MED ORDER — METFORMIN HCL 1000 MG PO TABS: 1000.0000 mg | ORAL_TABLET | Freq: Two times a day (BID) | ORAL | Status: DC

## 2010-10-14 NOTE — Telephone Encounter (Signed)
Pt called and she states her CBG's have been high, she did start taking metformin as directed and sees some improvement.  She will watch closely and call if any change in cbg's.

## 2010-11-07 ENCOUNTER — Other Ambulatory Visit: Payer: Self-pay | Admitting: Internal Medicine

## 2010-11-08 ENCOUNTER — Other Ambulatory Visit: Payer: Self-pay | Admitting: *Deleted

## 2010-11-08 NOTE — Telephone Encounter (Signed)
Needs Accu-chek multiclix (aviva)

## 2010-11-10 MED ORDER — ACCU-CHEK MULTICLIX LANCETS MISC: Status: DC

## 2010-12-06 ENCOUNTER — Other Ambulatory Visit: Payer: Self-pay | Admitting: Internal Medicine

## 2011-02-25 LAB — GLUCOSE, CAPILLARY: Glucose-Capillary: 213 — ABNORMAL HIGH

## 2011-03-19 ENCOUNTER — Other Ambulatory Visit: Payer: Self-pay | Admitting: Family Medicine

## 2011-03-19 DIAGNOSIS — Z1231 Encounter for screening mammogram for malignant neoplasm of breast: Secondary | ICD-10-CM

## 2011-03-25 ENCOUNTER — Encounter: Payer: Medicare Other | Admitting: Internal Medicine

## 2011-03-26 ENCOUNTER — Telehealth: Payer: Self-pay | Admitting: *Deleted

## 2011-03-26 ENCOUNTER — Other Ambulatory Visit: Payer: Self-pay | Admitting: Internal Medicine

## 2011-03-26 MED ORDER — GLUCOSE BLOOD VI STRP: ORAL_STRIP | Status: DC

## 2011-03-26 NOTE — Telephone Encounter (Signed)
ordered

## 2011-03-26 NOTE — Telephone Encounter (Signed)
Pt needs a refill on her Accu-chek Aviva Plus Strips.

## 2011-04-07 ENCOUNTER — Ambulatory Visit (HOSPITAL_COMMUNITY)
Admission: RE | Admit: 2011-04-07 | Discharge: 2011-04-07 | Disposition: A | Discharge status: Home / Self Care | Payer: Medicare Other | Source: Ambulatory Visit | Attending: Internal Medicine | Admitting: Internal Medicine

## 2011-04-07 ENCOUNTER — Ambulatory Visit (INDEPENDENT_AMBULATORY_CARE_PROVIDER_SITE_OTHER): Payer: Medicare Other | Admitting: Internal Medicine

## 2011-04-07 VITALS — BP 125/76 | HR 80 | Temp 97.0°F | Ht 67.0 in | Wt 176.0 lb

## 2011-04-07 DIAGNOSIS — M25539 Pain in unspecified wrist: Secondary | ICD-10-CM | POA: Insufficient documentation

## 2011-04-07 DIAGNOSIS — E119 Type 2 diabetes mellitus without complications: Secondary | ICD-10-CM

## 2011-04-07 DIAGNOSIS — M25532 Pain in left wrist: Secondary | ICD-10-CM

## 2011-04-07 DIAGNOSIS — Z23 Encounter for immunization: Secondary | ICD-10-CM

## 2011-04-07 DIAGNOSIS — F17200 Nicotine dependence, unspecified, uncomplicated: Secondary | ICD-10-CM

## 2011-04-07 LAB — BASIC METABOLIC PANEL
BUN: 12 mg/dL (ref 6–23)
Calcium: 9.5 mg/dL (ref 8.4–10.5)
Chloride: 106 mEq/L (ref 96–112)
Creat: 0.5 mg/dL (ref 0.50–1.10)
Glucose, Bld: 218 mg/dL — ABNORMAL HIGH (ref 70–99)
Potassium: 4.2 mEq/L (ref 3.5–5.3)
Sodium: 140 mEq/L (ref 135–145)

## 2011-04-07 LAB — LIPID PANEL
HDL: 51 mg/dL (ref >39)
LDL Cholesterol: 83 mg/dL (ref 0–99)
Total CHOL/HDL Ratio: 3.1 Ratio
Triglycerides: 125 mg/dL (ref <150)
VLDL: 25 mg/dL (ref 0–40)

## 2011-04-07 LAB — GLUCOSE, CAPILLARY: Glucose-Capillary: 228 mg/dL — ABNORMAL HIGH (ref 70–99)

## 2011-04-07 NOTE — Assessment & Plan Note (Signed)
Is been ongoing for several months post fall, range of motion is intact. We'll check a hand x-ray for signs of fracture. No other treatment at this time no sign of carpal tunnel syndrome given negative Tinel and no sign of muscle wasting

## 2011-04-07 NOTE — Progress Notes (Signed)
  Subjective:    Patient ID: Krista Sosa, female    DOB: Nov 09, 1950, 60 y.o.   MRN: [id number]  HPI  Patient is a 60 year old female with a past medical history listed below, presents to the outpatient clinic for diabetes followup, routine health maintenance and left wrist pain that has been ongoing for several months post fall.  Is otherwise feeling well denies any other complaints   Patient Active Problem List  Diagnoses  . LIVER MASS  . DIABETES MELLITUS, TYPE II  . TOBACCO ABUSE  . PERIPHERAL NEUROPATHY  . ALLERGIC RHINITIS  . ASTHMA  . OSTEOARTHRITIS  . SHOULDER PAIN, BILATERAL  . LOW BACK PAIN   Current Outpatient Prescriptions on File Prior to Visit  Medication Sig Dispense Refill  . ACTOS 30 MG tablet TAKE 1 TABLET BY MOUTH DAILY  30 tablet  PRN  . fluticasone (FLONASE) 50 MCG/ACT nasal spray 2 sprays by Nasal route daily.  48 g  2  . fluticasone (FLOVENT DISKUS) 50 MCG/BLIST diskus inhaler Inhale 1 puff into the lungs 2 (two) times daily.  3 Inhaler  2  . GLIPIZIDE XL 10 MG 24 hr tablet TAKE 2 TABLETS BY MOUTH EVERY DAY  60 tablet  PRN  . glucose blood test strip Use as instructed  100 each  11  . Lancets (ACCU-CHEK MULTICLIX) lancets Use as instructed  100 each  12  . metFORMIN (GLUCOPHAGE) 1000 MG tablet Take 1 tablet (1,000 mg total) by mouth 2 (two) times daily with a meal.  60 tablet  3  . metFORMIN (GLUMETZA) 1000 MG (MOD) 24 hr tablet Take 1,000 mg by mouth daily with breakfast.        . naproxen (NAPROSYN) 500 MG tablet Take 500 mg by mouth 4 (four) times daily as needed.        Marland Kitchen PROAIR HFA 108 (90 BASE) MCG/ACT inhaler INHALE ONE TO TWO PUFFS BY MOUTH EVERY SIX HOURS AS NEEDED  1 Inhaler  6  . venlafaxine (EFFEXOR-XR) 75 MG 24 hr capsule Take 75 mg by mouth daily.         No Known Allergies   Review of Systems  All other systems reviewed and are negative.       Objective:   Physical Exam  Nursing note and vitals reviewed. Constitutional: She is  oriented to person, place, and time. She appears well-developed and well-nourished.  HENT:  Head: Normocephalic and atraumatic.  Eyes: Pupils are equal, round, and reactive to light.  Neck: Normal range of motion. Neck supple. No JVD present. No thyromegaly present.  Cardiovascular: Normal rate, regular rhythm and normal heart sounds.   No murmur heard. Pulmonary/Chest: Effort normal and breath sounds normal. She has no wheezes. She has no rales.  Abdominal: Soft. Bowel sounds are normal.  Musculoskeletal: Normal range of motion. She exhibits no edema.  Neurological: She is alert and oriented to person, place, and time.  Skin: Skin is warm and dry.          Assessment & Plan:

## 2011-04-07 NOTE — Patient Instructions (Signed)
Please come back to see our diabetic educator to discuss starting insulin.

## 2011-04-07 NOTE — Assessment & Plan Note (Signed)
Her A1c today is 10, which is similar to her prior reading, however note that this is a significant increase from last year were her A1c was 8. I have discussed with the patient to start taking insulin today, she appears hesitant. I will bring the patient back to talk to our diabetic educator about starting insulin and have a simultaneous visits with physician to start insulin. She would likely need low-dose Lantus or insulin 7030 to start off with. Note also check the patient's urine microalbumin/creatinine ratio, if elevated will consider starting an ACE inhibitor. She's up-to-date on her other diabetic health maintenance.

## 2011-04-07 NOTE — Assessment & Plan Note (Signed)
Patient was counseled on smoking cessation strategies including medications and behavior modification options. Patient said she was not ready to stop smoking at this time.    

## 2011-04-08 LAB — MICROALBUMIN / CREATININE URINE RATIO: Microalb, Ur: 0.71 mg/dL (ref 0.00–1.89)

## 2011-04-09 ENCOUNTER — Ambulatory Visit (HOSPITAL_COMMUNITY)
Admission: RE | Admit: 2011-04-09 | Discharge: 2011-04-09 | Disposition: A | Discharge status: Home / Self Care | Payer: Medicare Other | Source: Ambulatory Visit | Attending: Family Medicine | Admitting: Family Medicine

## 2011-04-09 DIAGNOSIS — Z1231 Encounter for screening mammogram for malignant neoplasm of breast: Secondary | ICD-10-CM | POA: Insufficient documentation

## 2011-04-21 ENCOUNTER — Encounter: Payer: Self-pay | Admitting: Internal Medicine

## 2011-04-21 ENCOUNTER — Ambulatory Visit (INDEPENDENT_AMBULATORY_CARE_PROVIDER_SITE_OTHER): Payer: Medicare Other | Admitting: Internal Medicine

## 2011-04-21 ENCOUNTER — Other Ambulatory Visit: Payer: Self-pay | Admitting: Internal Medicine

## 2011-04-21 ENCOUNTER — Ambulatory Visit: Payer: Medicare Other | Admitting: Dietician

## 2011-04-21 VITALS — BP 130/82 | HR 102 | Temp 98.5°F | Ht 67.0 in | Wt 175.6 lb

## 2011-04-21 DIAGNOSIS — M25539 Pain in unspecified wrist: Secondary | ICD-10-CM

## 2011-04-21 DIAGNOSIS — M25532 Pain in left wrist: Secondary | ICD-10-CM

## 2011-04-21 DIAGNOSIS — E119 Type 2 diabetes mellitus without complications: Secondary | ICD-10-CM

## 2011-04-21 LAB — GLUCOSE, CAPILLARY: Glucose-Capillary: 202 mg/dL — ABNORMAL HIGH (ref 70–99)

## 2011-04-21 MED ORDER — MELOXICAM 7.5 MG PO TABS: 7.5000 mg | ORAL_TABLET | Freq: Every day | ORAL | Status: DC

## 2011-04-21 NOTE — Assessment & Plan Note (Signed)
Patient is ready to initiate insulin, will set up a meeting with Jamison Neighbor to discuss insulin usage, caution, and potential risks/benefits. Patient will likely need to be started on Lantus 10 units at bedtime or insulin 70/30 at 5 units twice a day.

## 2011-04-21 NOTE — Progress Notes (Signed)
E-prescribe not working; Mobic rx faxed to [facility].

## 2011-04-21 NOTE — Progress Notes (Signed)
  Subjective:    Patient ID: Krista Sosa, female    DOB: [birth year], 60 y.o.   MRN: [id number]  HPI  Patient is a 60 year old female with a past medical history listed below, presents to the outpatient clinic for diabetes followup, also today she complains of flulike symptoms, ongoing for the past several days not associated with coughing. In terms of her diabetes, last visit we discussed initiating insulin, however patient was hesitant. Today patient is ready to take the next step to start taking insulin. I have discussed with her what is needed for insulin and informed her that she would need to talk to a diabetic educator for more details. Denies any other complaints   Patient Active Problem List  Diagnoses  . LIVER MASS  . DIABETES MELLITUS, TYPE II  . TOBACCO ABUSE  . PERIPHERAL NEUROPATHY  . ALLERGIC RHINITIS  . ASTHMA  . OSTEOARTHRITIS  . SHOULDER PAIN, BILATERAL  . LOW BACK PAIN  . Left wrist pain   Current Outpatient Prescriptions on File Prior to Visit  Medication Sig Dispense Refill  . ACTOS 30 MG tablet TAKE 1 TABLET BY MOUTH DAILY  30 tablet  PRN  . fluticasone (FLONASE) 50 MCG/ACT nasal spray 2 sprays by Nasal route daily.  48 g  2  . fluticasone (FLOVENT DISKUS) 50 MCG/BLIST diskus inhaler Inhale 1 puff into the lungs 2 (two) times daily.  3 Inhaler  2  . GLIPIZIDE XL 10 MG 24 hr tablet TAKE 2 TABLETS BY MOUTH EVERY DAY  60 tablet  PRN  . glucose blood test strip Use as instructed  100 each  11  . Lancets (ACCU-CHEK MULTICLIX) lancets Use as instructed  100 each  12  . metFORMIN (GLUCOPHAGE) 1000 MG tablet Take 1 tablet (1,000 mg total) by mouth 2 (two) times daily with a meal.  60 tablet  3  . metFORMIN (GLUMETZA) 1000 MG (MOD) 24 hr tablet Take 1,000 mg by mouth daily with breakfast.        . PROAIR HFA 108 (90 BASE) MCG/ACT inhaler INHALE ONE TO TWO PUFFS BY MOUTH EVERY SIX HOURS AS NEEDED  1 Inhaler  6  . venlafaxine (EFFEXOR-XR) 75 MG 24 hr capsule Take 75 mg  by mouth daily.         No Known Allergies   Review of Systems  All other systems reviewed and are negative.       Objective:   Physical Exam  Vitals reviewed. Constitutional: She is oriented to person, place, and time. She appears well-developed and well-nourished.  HENT:  Head: Normocephalic and atraumatic.  Eyes: Pupils are equal, round, and reactive to light.  Neck: Normal range of motion. Neck supple. No JVD present. No thyromegaly present.  Cardiovascular: Normal rate, regular rhythm and normal heart sounds.   No murmur heard. Pulmonary/Chest: Effort normal and breath sounds normal. She has no wheezes. She has no rales.  Abdominal: Soft. Bowel sounds are normal.  Musculoskeletal: Normal range of motion. She exhibits no edema.  Neurological: She is alert and oriented to person, place, and time.  Skin: Skin is warm and dry.          Assessment & Plan:

## 2011-04-21 NOTE — Patient Instructions (Signed)
Please take mobic daily for your wrist pain.

## 2011-04-21 NOTE — Assessment & Plan Note (Signed)
X-ray obtained last visit was benign, gave short-term nonsteroidal anti-inflammatories for temporary pain relief also encouraged patient to use ice packs as needed for pain relief.

## 2011-04-22 NOTE — Progress Notes (Signed)
Addended by: Neomia Dear on: 04/22/2011 05:45 PM   Modules accepted: Orders

## 2011-05-16 ENCOUNTER — Encounter: Payer: Medicare Other | Admitting: Internal Medicine

## 2011-05-16 ENCOUNTER — Ambulatory Visit: Payer: Medicare Other | Admitting: Dietician

## 2011-05-29 ENCOUNTER — Other Ambulatory Visit: Payer: Self-pay | Admitting: Internal Medicine

## 2011-06-30 ENCOUNTER — Encounter: Payer: Medicare Other | Admitting: Internal Medicine

## 2011-07-24 ENCOUNTER — Other Ambulatory Visit: Payer: Self-pay | Admitting: Internal Medicine

## 2011-07-28 ENCOUNTER — Encounter: Payer: Medicare Other | Admitting: Internal Medicine

## 2011-08-04 ENCOUNTER — Telehealth: Payer: Self-pay | Admitting: Dietician

## 2011-08-04 NOTE — Telephone Encounter (Signed)
Called to follow up on no show- patient forgot about appointment. She was transferred to front office to reschedule it.

## 2011-08-08 ENCOUNTER — Encounter: Payer: Medicare Other | Admitting: Internal Medicine

## 2011-08-13 ENCOUNTER — Ambulatory Visit (INDEPENDENT_AMBULATORY_CARE_PROVIDER_SITE_OTHER): Payer: Medicare Other | Admitting: Internal Medicine

## 2011-08-13 ENCOUNTER — Encounter: Payer: Self-pay | Admitting: Internal Medicine

## 2011-08-13 VITALS — BP 120/76 | HR 91 | Temp 97.8°F | Ht 67.0 in | Wt 171.9 lb

## 2011-08-13 DIAGNOSIS — K219 Gastro-esophageal reflux disease without esophagitis: Secondary | ICD-10-CM

## 2011-08-13 DIAGNOSIS — F17200 Nicotine dependence, unspecified, uncomplicated: Secondary | ICD-10-CM

## 2011-08-13 DIAGNOSIS — R1310 Dysphagia, unspecified: Secondary | ICD-10-CM

## 2011-08-13 DIAGNOSIS — Z79899 Other long term (current) drug therapy: Secondary | ICD-10-CM

## 2011-08-13 DIAGNOSIS — E119 Type 2 diabetes mellitus without complications: Secondary | ICD-10-CM

## 2011-08-13 DIAGNOSIS — M1990 Unspecified osteoarthritis, unspecified site: Secondary | ICD-10-CM

## 2011-08-13 HISTORY — DX: Gastro-esophageal reflux disease without esophagitis: K21.9

## 2011-08-13 LAB — GLUCOSE, CAPILLARY: Glucose-Capillary: 104 mg/dL — ABNORMAL HIGH (ref 70–99)

## 2011-08-13 MED ORDER — GLIPIZIDE ER 10 MG PO TB24: 20.0000 mg | ORAL_TABLET | Freq: Every day | ORAL | Status: DC

## 2011-08-13 MED ORDER — ESOMEPRAZOLE MAGNESIUM 40 MG PO CPDR: 40.0000 mg | DELAYED_RELEASE_CAPSULE | Freq: Every day | ORAL | Status: DC

## 2011-08-13 MED ORDER — HYDROCODONE-ACETAMINOPHEN 5-500 MG PO TABS: 2.0000 | ORAL_TABLET | Freq: Four times a day (QID) | ORAL | Status: DC | PRN

## 2011-08-13 NOTE — Assessment & Plan Note (Signed)
Initiate proton pump inhibitors today and reassess at next visit
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
## 2011-08-13 NOTE — Assessment & Plan Note (Signed)
A1c today 7.8, this is improved from prior checks, increase glipizide to 20 mg daily and recheck again in 3 months. Patient is up-to-date on her health maintenance related to diabetes.

## 2011-08-13 NOTE — Patient Instructions (Addendum)
Increase your glipizide to 20mg  daily.  Please start taking Nexium daily for acid reflux

## 2011-08-13 NOTE — Assessment & Plan Note (Signed)
This is been ongoing for the past several weeks, dysphasias more related to solid foods rather than liquids, could be related to mass, stricture or diabetic gastroparesis. Will refer to GI for possible need of EGD. if negative will proceed with gastric emptying study to evaluate for gastroparesis .

## 2011-08-13 NOTE — Assessment & Plan Note (Signed)
Patient was counseled on smoking cessation strategies including medications and behavior modification options. Patient said she was not ready to stop smoking at this time.    

## 2011-08-13 NOTE — Assessment & Plan Note (Signed)
Pain is chronic and stable, prescribe Vicodin as needed for pain.

## 2011-08-14 ENCOUNTER — Encounter: Payer: Self-pay | Admitting: Internal Medicine

## 2011-08-14 ENCOUNTER — Telehealth: Payer: Self-pay | Admitting: Dietician

## 2011-08-14 DIAGNOSIS — E119 Type 2 diabetes mellitus without complications: Secondary | ICD-10-CM

## 2011-08-14 NOTE — Telephone Encounter (Signed)
Voicemail forwarded to CDE from triage nurse: from patient's case manager: Patient needs simple meter and she called [facility] and verified patient "due for new meter". Patient used Case manager's Accu check meter today with a fastclick with out problem.

## 2011-08-15 MED ORDER — ACCU-CHEK NANO SMARTVIEW W/DEVICE KIT: 1.0000 | PACK | Freq: Every morning | Status: DC

## 2011-08-15 MED ORDER — ACCU-CHEK FASTCLIX LANCETS MISC: 1.0000 | Freq: Every morning | Status: DC

## 2011-08-15 MED ORDER — GLUCOSE BLOOD VI STRP: ORAL_STRIP | Status: DC

## 2011-08-15 NOTE — Telephone Encounter (Signed)
approved

## 2011-08-21 ENCOUNTER — Other Ambulatory Visit: Payer: Self-pay | Admitting: Internal Medicine

## 2011-09-10 ENCOUNTER — Encounter: Payer: Self-pay | Admitting: *Deleted

## 2011-09-10 ENCOUNTER — Ambulatory Visit (INDEPENDENT_AMBULATORY_CARE_PROVIDER_SITE_OTHER): Payer: Medicare Other | Admitting: Dietician

## 2011-09-10 VITALS — Ht 67.0 in | Wt 173.2 lb

## 2011-09-10 DIAGNOSIS — F17200 Nicotine dependence, unspecified, uncomplicated: Secondary | ICD-10-CM

## 2011-09-10 DIAGNOSIS — E119 Type 2 diabetes mellitus without complications: Secondary | ICD-10-CM

## 2011-09-10 NOTE — Progress Notes (Signed)
Diabetes Self-Management Training (DSMT)  Follow-Up 1 Visit  09/10/2011 Ms. Laqueta Jean, identified by name and date of birth, is a 61 y.o. female with Type 2 Diabetes. Year of diabetes diagnosis: 2007 Other persons present: no  ASSESSMENT Patient concerns are Medication, Healthy Lifestyle and Glycemic control.  Height 5\' 7"  (1.702 m), weight 173 lb 3.2 oz (78.563 kg), last menstrual period 08/27/2007. Body mass index is 27.13 kg/(m^2). Lab Results  Component Value Date   LDLCALC 83 04/07/2011   Lab Results  Component Value Date   HGBA1C 7.8 08/13/2011    snapshot and  Labs reviewed.  DIABETES BUNDLE: A1C in past 6 months? Yes.  Less than 7%? No LDL in past year? Yes.  Less than 100 mg/dL? Yes Microalbumin ratio in past year? Yes. Blood pressure less than 130/80? Yes. Foot exam in last year? Yes. Eye exam in past year? Yes. Tobacco use? Yes.  Smoking cessation offered? Yes Pneumovax? Yes Flu vaccine? Yes Asprin? Yes  Family history of diabetes: Yes Support systems: friends Special needs: Simplified materials, Verbal instruction Prior DM Education: Yes   Medications See Medications list.  Not taking as prescribed  Patients belief/attitude about diabetes: Diabetes can be controlled. Self foot exams daily: Yes Diabetes Complications: Neuropathy   Exercise Plan Doing walking for 30 minutesa day.   Self-Monitoring Frequency of testing: 1-2 times/day- did not bring meter today Breakfast: 160-190 Hyperglycemia: No Hypoglycemia: No   Meal Planning Some knowledge and Interested in improving- eats moderate portions 3 times days, few snacks   Assessment comments: patient willing to begin insulin with insulin pens. Gave herself an injection with saline today and learned how to use insulin pen without problem.    INDIVIDUAL DIABETES EDUCATION PLAN:  Medication Goal  setting _______________________________________________________________________  Intervention TOPICS COVERED TODAY:  Medication  Taught/evaluated insulin injection, site rotation, insulin storage and needle disposal. Reviewed patients medication for diabetes, action, purpose, timing of dose and side effects. Chronic complications  importance of daily foot exams Goal setting  Review risk of smoking and offered smoking cessation Helped patient develop diabetes management plan for quitting smoking- she called quitline from office today  PATIENTS GOALS/PLAN (copy and paste in patient instructions so patient receives a copy): 1.  Learning Objective:       Know when to call office for questions 2.  Behavioral Objective:         Medications: To improve blood glucose levels, I will take my medication as prescribed Half of the time 75% Reducing Risk: To decrease the risk for complications, I will stop smoking  Sometimes 25%  Personalized Follow-Up Plan for Ongoing Self Management Support:  Doctor's Office, family and CDE visits ______________________________________________________________________   Outcomes Expected outcomes: Demonstrated interest in learning.Expect positive changes in lifestyle. Self-care Barriers: Lack of transportation Education material provided: yes on basic meal planning, insulin administration and  Patient to contact team via Phone if problems or questions. Time in: 1600     Time out: 1630 Future DSMT - 4-6 wks   Kip Cropp, Lupita Leash

## 2011-09-10 NOTE — Patient Instructions (Addendum)
Please try to get a medicine/pill box to help you remember to take all your medicines.  Check your blood sugar each day before breakfast for 7 days then before dinner for 7 days, then before bedtime for 7 days and write on sheet provided.  Please return in 3 weeks with sheet with blood sugar and meter.   Hope the SMOKING QUITLINE works for you! [phone]) Good job and good luck!!!!

## 2011-09-16 ENCOUNTER — Ambulatory Visit: Payer: Medicare Other | Admitting: Internal Medicine

## 2011-09-22 ENCOUNTER — Other Ambulatory Visit: Payer: Self-pay | Admitting: *Deleted

## 2011-09-22 DIAGNOSIS — M1990 Unspecified osteoarthritis, unspecified site: Secondary | ICD-10-CM

## 2011-09-22 MED ORDER — HYDROCODONE-ACETAMINOPHEN 5-500 MG PO TABS: 2.0000 | ORAL_TABLET | Freq: Four times a day (QID) | ORAL | Status: DC | PRN

## 2011-09-23 NOTE — Telephone Encounter (Signed)
Hydrocodone 5/500mg  rx called to [facility].

## 2011-10-01 ENCOUNTER — Encounter: Payer: Medicare Other | Admitting: Dietician

## 2011-10-14 ENCOUNTER — Encounter: Payer: Self-pay | Admitting: *Deleted

## 2011-10-14 ENCOUNTER — Encounter: Payer: Medicare Other | Admitting: Dietician

## 2011-10-15 ENCOUNTER — Ambulatory Visit: Payer: Medicare Other | Admitting: Internal Medicine

## 2011-10-15 NOTE — Progress Notes (Signed)
Addended by: Neomia Dear on: 10/15/2011 05:55 PM   Modules accepted: Orders

## 2011-11-14 ENCOUNTER — Other Ambulatory Visit: Payer: Self-pay | Admitting: Internal Medicine

## 2011-11-14 NOTE — Telephone Encounter (Signed)
Message sent to front desk to schedule pt an appt. 

## 2011-11-14 NOTE — Telephone Encounter (Signed)
Pls ask pt to make appt with new PCP within next 3 months. I have given her three months of med so that she has time to get in and be seen

## 2011-12-31 ENCOUNTER — Encounter: Payer: Medicare Other | Admitting: Internal Medicine

## 2012-02-11 ENCOUNTER — Other Ambulatory Visit: Payer: Self-pay | Admitting: Internal Medicine

## 2012-02-11 DIAGNOSIS — J45909 Unspecified asthma, uncomplicated: Secondary | ICD-10-CM

## 2012-02-13 ENCOUNTER — Ambulatory Visit (INDEPENDENT_AMBULATORY_CARE_PROVIDER_SITE_OTHER): Payer: Medicare Other | Admitting: Internal Medicine

## 2012-02-13 ENCOUNTER — Encounter: Payer: Self-pay | Admitting: Internal Medicine

## 2012-02-13 VITALS — BP 133/82 | HR 87 | Temp 98.6°F | Wt 175.1 lb

## 2012-02-13 DIAGNOSIS — E663 Overweight: Secondary | ICD-10-CM

## 2012-02-13 DIAGNOSIS — E1142 Type 2 diabetes mellitus with diabetic polyneuropathy: Secondary | ICD-10-CM

## 2012-02-13 DIAGNOSIS — D573 Sickle-cell trait: Secondary | ICD-10-CM

## 2012-02-13 DIAGNOSIS — E119 Type 2 diabetes mellitus without complications: Secondary | ICD-10-CM

## 2012-02-13 DIAGNOSIS — K219 Gastro-esophageal reflux disease without esophagitis: Secondary | ICD-10-CM

## 2012-02-13 DIAGNOSIS — J45909 Unspecified asthma, uncomplicated: Secondary | ICD-10-CM

## 2012-02-13 DIAGNOSIS — F17200 Nicotine dependence, unspecified, uncomplicated: Secondary | ICD-10-CM

## 2012-02-13 DIAGNOSIS — J309 Allergic rhinitis, unspecified: Secondary | ICD-10-CM

## 2012-02-13 DIAGNOSIS — E1140 Type 2 diabetes mellitus with diabetic neuropathy, unspecified: Secondary | ICD-10-CM

## 2012-02-13 DIAGNOSIS — L280 Lichen simplex chronicus: Secondary | ICD-10-CM

## 2012-02-13 DIAGNOSIS — Z23 Encounter for immunization: Secondary | ICD-10-CM

## 2012-02-13 DIAGNOSIS — M1990 Unspecified osteoarthritis, unspecified site: Secondary | ICD-10-CM

## 2012-02-13 DIAGNOSIS — E1149 Type 2 diabetes mellitus with other diabetic neurological complication: Secondary | ICD-10-CM

## 2012-02-13 DIAGNOSIS — D128 Benign neoplasm of rectum: Secondary | ICD-10-CM

## 2012-02-13 DIAGNOSIS — Z6825 Body mass index (BMI) 25.0-25.9, adult: Secondary | ICD-10-CM

## 2012-02-13 HISTORY — DX: Overweight: E66.3

## 2012-02-13 HISTORY — DX: Lichen simplex chronicus: L28.0

## 2012-02-13 HISTORY — DX: Sickle-cell trait: D57.3

## 2012-02-13 LAB — POCT GLYCOSYLATED HEMOGLOBIN (HGB A1C): Hemoglobin A1C: 10.1

## 2012-02-13 LAB — LIPID PANEL
Cholesterol: 176 mg/dL (ref 0–200)
Total CHOL/HDL Ratio: 4.3 Ratio

## 2012-02-13 MED ORDER — MELOXICAM 15 MG PO TABS: 15.0000 mg | ORAL_TABLET | Freq: Every day | ORAL | Status: DC

## 2012-02-13 NOTE — Patient Instructions (Addendum)
It was very nice to meet you.  I look forward to taking care of you for years to come.  1) Meloxicam 15 mg by mouth daily for the shoulder and neck pain  2) Work on exercise and weight loss in hopes of getting better control of your diabetes  3) Take the Actos regularly in hopes of improving your diabetes  4) Call me with the name of the cream for your skin itching\  5) Pap smear at next visit  6) I will call with lab results  I will see you in three months

## 2012-02-13 NOTE — Assessment & Plan Note (Signed)
Poorly controlled diabetes.  Several factors are playing a role.  She has not been as active lately and she has not been complaint with her rosiglitazone.  The issue of starting lantus 10 units QPM was brought up and she is not ready to start insulin as of yet.  She would like one more chance to get control of her diabetes with exercise, diet, and compliance with all oral medications.  She will continue the metformin 1000 mg PO BID, glipizide XL 20 mg PO QD, and restart the rosiglitizone 30 mg PO QD.  She will be referred to an Ophthalmologist for a diabetic eye examination.  Her lipids and microalbumin have been obtained.  She will return in 3 months for a repeat Hgb A1C.  If elevated at that time she would be willing to start insulin.

## 2012-02-13 NOTE — Assessment & Plan Note (Signed)
Well controlled on the Flonase.  Will continue.

## 2012-02-13 NOTE — Assessment & Plan Note (Signed)
Repeat colonoscopy is due in July 2015.

## 2012-02-13 NOTE — Assessment & Plan Note (Signed)
We reviewed the previous therapy she has tried for her OA.  She has not yet tried meloxicam even though it was previously prescribed.  She is willing to give it a try at this time.  She was also offered bilateral steroid injections as these were helpful in the past and exam is consistent with a bilateral bursitis.  She is not interested in this therapy at this time.  If the meloxicam 15 mg PO Q24H PRN pain is not helpful we will try sulindac/etodolac or tramadol in the future.

## 2012-02-13 NOTE — Assessment & Plan Note (Signed)
Well controlled on the nexium.  We will continue.

## 2012-02-13 NOTE — Progress Notes (Signed)
Subjective:     Patient ID: Krista Sosa, female   DOB: 1951/03/21, 61 y.o.   MRN: [id number]  Shoulder Pain  The pain is present in the neck, left shoulder and right shoulder. This is a chronic problem. The current episode started more than 1 year ago. There has been no history of extremity trauma. The problem occurs constantly. The problem has been gradually worsening. The quality of the pain is described as aching. The pain is at a severity of 8/10. The pain is severe. Associated symptoms include stiffness. Pertinent negatives include no fever, itching, joint locking, joint swelling, numbness or tingling. The symptoms are aggravated by activity and cold. She has tried acetaminophen, NSAIDS, oral narcotics and rest for the symptoms. The treatment provided no relief. Family history does not include gout or rheumatoid arthritis. Her past medical history is significant for diabetes and osteoarthritis. There is no history of gout or rheumatoid arthritis.   Diabetes Ms. Raymond admits to some non-compliance with the rosiglitazone secondary to the sensation that it makes her urinate frequently.  With the hot weather she has not been walking or riding her bike as often as she would like to.  She has been complaint with her metformin and glipizide.  Sugars have been in the 175 to 300 range at home.  Tobacco abuse Ms. Muller is not ready to quit.  When she becomes ready she plans on quitting "cold [place]".  Allergic rhinitis Ms. Bedwell continues to use [employer] with control of her symptoms.  Overweight BMI 27.  Ms. Heidler plans to do more biking and walking with the cooler weather.  She is not interested in a referral to a dietician at this time.  GERD Symptoms are well controlled on the nexium.  Asthma Symptoms are well controlled on the Flovent diskus and PRN albuterol.  She has no nocturnal cough or SOB.  Review of Systems  Constitutional: Negative for fever and unexpected  weight change.  HENT: Positive for neck pain and neck stiffness. Negative for facial swelling.   Respiratory: Negative for cough, chest tightness, shortness of breath and wheezing.   Cardiovascular: Negative for chest pain and leg swelling.  Gastrointestinal: Negative for abdominal distention.  Musculoskeletal: Positive for arthralgias and stiffness. Negative for back pain, joint swelling and gout.  Skin: Negative for itching.  Neurological: Negative for tingling and numbness.      Objective:   Physical Exam  Constitutional: She is oriented to person, place, and time. She appears well-developed and well-nourished. No distress.  HENT:  Head: Normocephalic and atraumatic.  Neck: Normal range of motion. Neck supple.  Cardiovascular: Normal rate and regular rhythm.  Exam reveals no gallop and no friction rub.   No murmur heard. Pulmonary/Chest: Effort normal and breath sounds normal. No stridor. No respiratory distress. She has no wheezes. She has no rales.  Abdominal: Soft. She exhibits no distension. There is no tenderness. There is no rebound and no guarding.  Musculoskeletal: Normal range of motion. She exhibits no edema and no tenderness.  Neurological: She is alert and oriented to person, place, and time.  Skin: Skin is warm. No rash noted. No erythema.  Psychiatric: She has a normal mood and affect. Her behavior is normal. Judgment and thought content normal.      Assessment/Plan:     Please see problem based charting

## 2012-02-13 NOTE — Assessment & Plan Note (Signed)
Ms. Mcdougall is not interested in dietary counseling at this time.  She plans on doing more exercise now that the weather is cooler and will do more walking and biking.  We will shoot for a goal of 10 pound weight loss over the next 3 months.

## 2012-02-13 NOTE — Assessment & Plan Note (Signed)
Well controlled on the fluticasone diskus and PRN albuterol.  Will continue.

## 2012-02-13 NOTE — Assessment & Plan Note (Signed)
Smoking cessation was discussed and its importance reviewed.  She is not mentally ready to quit smoking at this time.  When she is ready she plans on quitting cold [place].  She was reminded that we can provide some help if she would like.

## 2012-02-14 LAB — MICROALBUMIN / CREATININE URINE RATIO: Creatinine, Urine: 14.5 mg/dL

## 2012-02-18 ENCOUNTER — Other Ambulatory Visit: Payer: Self-pay | Admitting: Internal Medicine

## 2012-02-18 MED ORDER — HYDROCORTISONE 1 % EX CREA: TOPICAL_CREAM | CUTANEOUS | Status: DC

## 2012-02-18 NOTE — Addendum Note (Signed)
Addended by: Doneen Poisson D on: 02/18/2012 03:55 PM   Modules accepted: Orders

## 2012-02-18 NOTE — Progress Notes (Signed)
Spoke with Krista Sosa about her lab results.  Most concerning is the LDL of 114 which is above target for a diabetic.  We discussed diet, exercise and medications.  She wishes to try diet and exercise before starting any additional medications.  We will recheck her cholesterol at the next visit.  She was also able to give me the name of the medication that helped with her itching.  It was hydrocortisone 1%.  I will prescribe.

## 2012-02-19 ENCOUNTER — Telehealth: Payer: Self-pay | Admitting: *Deleted

## 2012-03-10 ENCOUNTER — Ambulatory Visit: Payer: Medicare Other | Admitting: Internal Medicine

## 2012-03-11 ENCOUNTER — Other Ambulatory Visit: Payer: Self-pay | Admitting: Internal Medicine

## 2012-03-11 DIAGNOSIS — E1140 Type 2 diabetes mellitus with diabetic neuropathy, unspecified: Secondary | ICD-10-CM

## 2012-05-14 ENCOUNTER — Encounter: Payer: Medicare Other | Admitting: Internal Medicine

## 2012-06-03 ENCOUNTER — Other Ambulatory Visit: Payer: Self-pay | Admitting: Internal Medicine

## 2012-06-03 DIAGNOSIS — J302 Other seasonal allergic rhinitis: Secondary | ICD-10-CM

## 2012-06-03 DIAGNOSIS — E1140 Type 2 diabetes mellitus with diabetic neuropathy, unspecified: Secondary | ICD-10-CM

## 2012-07-15 ENCOUNTER — Encounter: Payer: Self-pay | Admitting: Internal Medicine

## 2012-07-27 ENCOUNTER — Other Ambulatory Visit: Payer: Self-pay | Admitting: Internal Medicine

## 2012-07-27 DIAGNOSIS — K219 Gastro-esophageal reflux disease without esophagitis: Secondary | ICD-10-CM

## 2012-07-27 DIAGNOSIS — E1140 Type 2 diabetes mellitus with diabetic neuropathy, unspecified: Secondary | ICD-10-CM

## 2012-07-27 DIAGNOSIS — J45909 Unspecified asthma, uncomplicated: Secondary | ICD-10-CM

## 2012-09-03 ENCOUNTER — Encounter: Payer: Self-pay | Admitting: Internal Medicine

## 2012-09-03 ENCOUNTER — Ambulatory Visit (INDEPENDENT_AMBULATORY_CARE_PROVIDER_SITE_OTHER): Payer: Medicare Other | Admitting: Internal Medicine

## 2012-09-03 VITALS — BP 144/85 | HR 87 | Temp 97.0°F | Wt 172.1 lb

## 2012-09-03 DIAGNOSIS — Z0000 Encounter for general adult medical examination without abnormal findings: Secondary | ICD-10-CM

## 2012-09-03 DIAGNOSIS — Z720 Tobacco use: Secondary | ICD-10-CM

## 2012-09-03 DIAGNOSIS — E1165 Type 2 diabetes mellitus with hyperglycemia: Secondary | ICD-10-CM

## 2012-09-03 DIAGNOSIS — Z6825 Body mass index (BMI) 25.0-25.9, adult: Secondary | ICD-10-CM

## 2012-09-03 DIAGNOSIS — L280 Lichen simplex chronicus: Secondary | ICD-10-CM

## 2012-09-03 DIAGNOSIS — K219 Gastro-esophageal reflux disease without esophagitis: Secondary | ICD-10-CM

## 2012-09-03 DIAGNOSIS — J45909 Unspecified asthma, uncomplicated: Secondary | ICD-10-CM

## 2012-09-03 DIAGNOSIS — IMO0001 Reserved for inherently not codable concepts without codable children: Secondary | ICD-10-CM

## 2012-09-03 DIAGNOSIS — J3089 Other allergic rhinitis: Secondary | ICD-10-CM

## 2012-09-03 DIAGNOSIS — J302 Other seasonal allergic rhinitis: Secondary | ICD-10-CM

## 2012-09-03 DIAGNOSIS — IMO0002 Reserved for concepts with insufficient information to code with codable children: Secondary | ICD-10-CM

## 2012-09-03 DIAGNOSIS — J309 Allergic rhinitis, unspecified: Secondary | ICD-10-CM

## 2012-09-03 DIAGNOSIS — L259 Unspecified contact dermatitis, unspecified cause: Secondary | ICD-10-CM

## 2012-09-03 DIAGNOSIS — M1990 Unspecified osteoarthritis, unspecified site: Secondary | ICD-10-CM

## 2012-09-03 DIAGNOSIS — F17200 Nicotine dependence, unspecified, uncomplicated: Secondary | ICD-10-CM

## 2012-09-03 DIAGNOSIS — E663 Overweight: Secondary | ICD-10-CM

## 2012-09-03 MED ORDER — SITAGLIPTIN PHOSPHATE 50 MG PO TABS: 50.0000 mg | ORAL_TABLET | Freq: Every day | ORAL | Status: DC

## 2012-09-03 MED ORDER — ETODOLAC 500 MG PO TABS: 500.0000 mg | ORAL_TABLET | Freq: Two times a day (BID) | ORAL | Status: DC

## 2012-09-03 MED ORDER — TRIAMCINOLONE 0.1 % CREAM:EUCERIN CREAM 1:1: 1.0000 "application " | TOPICAL_CREAM | Freq: Two times a day (BID) | CUTANEOUS | Status: DC | PRN

## 2012-09-03 NOTE — Assessment & Plan Note (Signed)
The meloxicam was ineffective at controlling her pain from her osteoarthritis. She still has discomfort in her shoulders and knees bilaterally. She's failed many other classes of analgesics and anti-inflammatories. We therefore will try etodolac 500 mg by mouth twice a day as needed for her pain. If this is ineffective we may move to tramadol or Capsacian cream. We will reassess the control of her arthritic pain on the etodolac at the followup visit.

## 2012-09-03 NOTE — Assessment & Plan Note (Signed)
She's had a recurrence of her previously diagnosed lichenoid dermatitis. In the past she was treated with triamcinolone cream. We therefore decided to start triamcinolone 0.1% as needed every 12 hours to just the limbs and chest. She was warned to avoid the face as we did not want to thin the skin. We will reassess her response to this therapy at the followup visit.

## 2012-09-03 NOTE — Assessment & Plan Note (Signed)
We discussed the importance of tobacco cessation. She states she is not mentally ready to quit although she knows the importance of quitting. I told her we had medications that could help with the side effects associated with quitting and that we would bring this topic up at each visit.

## 2012-09-03 NOTE — Assessment & Plan Note (Signed)
She is due for a Pap smear. We will discuss this topic at the followup visit. If she is amenable, we will perform at that visit or reschedule a visit just for a Pap.

## 2012-09-03 NOTE — Patient Instructions (Signed)
It was good to see you again.  1) Please stop the actos and meloxicam as they were not helpful.  2) Start Triamcinolone 0.1% cream for your skin rash twice a day.  Do not put on your face.  3) Start Jenuvia (sitagliptan) 50 mg by mouth once daily for your diabetes.  4) Try to eat less sweets and breads/rice/pasta and be more active.  Your diabetes is very poorly controlled.  5) Start etodolac 500 mg by mouth twice a day as needed for arthritis pain.  I will see you back in 1-2 months to check up on your diabetes control and the pain control.

## 2012-09-03 NOTE — Assessment & Plan Note (Signed)
She was not interested in referral to our diabetic educator and nutritionist. She stated she will reflect upon what she needs to do in order to lose weight and improve her diabetic control. We will reassess her weight and diabetic control at the followup visit.

## 2012-09-03 NOTE — Progress Notes (Signed)
  Subjective:    Patient ID: Krista Sosa, female    DOB: 13-Sep-1950, 62 y.o.   MRN: [id number]  HPI  Please see the A&P for the status of the pt's chronic medical problems.  Review of Systems  Respiratory: Negative for cough, chest tightness, shortness of breath and wheezing.   Cardiovascular: Negative for chest pain and leg swelling.  Gastrointestinal: Negative for abdominal pain and abdominal distention.  Musculoskeletal: Positive for arthralgias. Negative for myalgias and joint swelling.  Skin: Positive for color change and rash. Negative for pallor and wound.  Allergic/Immunologic: Positive for environmental allergies.  Psychiatric/Behavioral: Negative for dysphoric mood. The patient is not nervous/anxious.       Objective:   Physical Exam  Nursing note and vitals reviewed. Constitutional: She is oriented to person, place, and time. She appears well-developed and well-nourished. No distress.  HENT:  Head: Normocephalic and atraumatic.  Eyes: Conjunctivae are normal. Right eye exhibits no discharge. Left eye exhibits no discharge. No scleral icterus.  Neck: Neck supple.  Cardiovascular: Normal rate, regular rhythm, normal heart sounds and intact distal pulses.  Exam reveals no gallop and no friction rub.   No murmur heard. Pulmonary/Chest: Effort normal and breath sounds normal. No respiratory distress. She has no wheezes. She has no rales.  Abdominal: Soft. Bowel sounds are normal. She exhibits no distension. There is no tenderness. There is no rebound and no guarding.  Musculoskeletal: Normal range of motion. She exhibits no edema and no tenderness.  Neurological: She is alert and oriented to person, place, and time.  Skin: Skin is warm and dry. Rash noted. She is not diaphoretic. No erythema. No pallor.  Psychiatric: She has a normal mood and affect. Her behavior is normal. Judgment and thought content normal.      Assessment & Plan:   Please see Problem Oriented  Charting.

## 2012-09-03 NOTE — Assessment & Plan Note (Signed)
She denied any problems with her allergic rhinitis on the fluticasone nasal spray. We will therefore continue this therapy.

## 2012-09-03 NOTE — Assessment & Plan Note (Signed)
Krista Sosa states that she's been compliant with her glipizide 20 mg by mouth daily and metformin 1000 mg by mouth twice a day. She admits to stopping the Actos after hearing possible side effects on a TV commercial. She is no longer interested in taking the Actos moving forward. Her hemoglobin A1c today was greater than 14 which suggests extremely poor control. Some of this may be related to the Actos discontinuance or there may be some inconsistent compliance with the other medications. I explained to her that it was unlikely that another oral tablet was going to get her into the target range for diabetic control. I stated it was time to begin insulin therapy if we were to gain control of her diabetes. She refused to start insulin at this time and was interested in starting sitagliptan. She also promised to self reflect upon her dietary and exercise habits in lieu of a referral to our diabetic educator, which she refused. Given the poor control of her diabetes and the addition of the sitagliptan we will bring her back for reassessment of diabetic control in one to 2 months and make further adjustments and continued education at that time. She is up-to-date on all her diabetic related screening.

## 2012-09-03 NOTE — Assessment & Plan Note (Signed)
She is without complaints on the use omeprazole 40 mg daily. We will therefore continue this therapy.

## 2012-09-03 NOTE — Assessment & Plan Note (Signed)
She states her asthma was well controlled on the Flovent discus one puff twice a day and the albuterol as needed. We will therefore continue this therapy, as well as aggressively treat her gastroesophageal reflux disease and allergic rhinitis in order to maintain control of her asthma.

## 2012-09-16 NOTE — Progress Notes (Signed)
At follow-up visit we will also discuss need for ACEI, ASA, and statin.

## 2012-11-12 ENCOUNTER — Ambulatory Visit: Payer: Medicare Other | Admitting: Internal Medicine

## 2012-12-24 ENCOUNTER — Other Ambulatory Visit: Payer: Self-pay | Admitting: Internal Medicine

## 2012-12-24 NOTE — Telephone Encounter (Signed)
Denied.She was to have seen Dr Josem Kaufmann in May or June to f/u her poorly controlled DM (A1C > 14 and new med started) but no showed the appt. He has no openings for sev months. So she will need [facility] Res appt long appt (skin issue and DM / continuity) ASAP and can get refill then.

## 2013-01-13 ENCOUNTER — Other Ambulatory Visit: Payer: Self-pay | Admitting: Internal Medicine

## 2013-01-13 DIAGNOSIS — L280 Lichen simplex chronicus: Secondary | ICD-10-CM

## 2013-01-20 ENCOUNTER — Other Ambulatory Visit: Payer: Self-pay | Admitting: Internal Medicine

## 2013-01-20 DIAGNOSIS — J45909 Unspecified asthma, uncomplicated: Secondary | ICD-10-CM

## 2013-02-25 ENCOUNTER — Encounter: Payer: Self-pay | Admitting: Internal Medicine

## 2013-02-25 ENCOUNTER — Ambulatory Visit (INDEPENDENT_AMBULATORY_CARE_PROVIDER_SITE_OTHER): Payer: Medicare Other | Admitting: Internal Medicine

## 2013-02-25 VITALS — BP 139/90 | HR 86 | Temp 98.2°F | Wt 171.3 lb

## 2013-02-25 DIAGNOSIS — IMO0001 Reserved for inherently not codable concepts without codable children: Secondary | ICD-10-CM

## 2013-02-25 DIAGNOSIS — Z720 Tobacco use: Secondary | ICD-10-CM

## 2013-02-25 DIAGNOSIS — M159 Polyosteoarthritis, unspecified: Secondary | ICD-10-CM

## 2013-02-25 DIAGNOSIS — F17200 Nicotine dependence, unspecified, uncomplicated: Secondary | ICD-10-CM

## 2013-02-25 DIAGNOSIS — IMO0002 Reserved for concepts with insufficient information to code with codable children: Secondary | ICD-10-CM

## 2013-02-25 DIAGNOSIS — Z0000 Encounter for general adult medical examination without abnormal findings: Secondary | ICD-10-CM

## 2013-02-25 DIAGNOSIS — E1165 Type 2 diabetes mellitus with hyperglycemia: Secondary | ICD-10-CM

## 2013-02-25 DIAGNOSIS — L280 Lichen simplex chronicus: Secondary | ICD-10-CM

## 2013-02-25 DIAGNOSIS — L259 Unspecified contact dermatitis, unspecified cause: Secondary | ICD-10-CM

## 2013-02-25 DIAGNOSIS — K219 Gastro-esophageal reflux disease without esophagitis: Secondary | ICD-10-CM

## 2013-02-25 DIAGNOSIS — J45909 Unspecified asthma, uncomplicated: Secondary | ICD-10-CM

## 2013-02-25 LAB — BASIC METABOLIC PANEL WITH GFR
BUN: 9 mg/dL (ref 6–23)
Chloride: 104 mEq/L (ref 96–112)
GFR, Est Non African American: >89 mL/min
Potassium: 4.6 mEq/L (ref 3.5–5.3)

## 2013-02-25 LAB — LIPID PANEL
Cholesterol: 165 mg/dL (ref 0–200)
VLDL: 23 mg/dL (ref 0–40)

## 2013-02-25 LAB — POCT GLYCOSYLATED HEMOGLOBIN (HGB A1C): Hemoglobin A1C: 8

## 2013-02-25 MED ORDER — ACCU-CHEK FASTCLIX LANCETS MISC: 1.0000 | Freq: Every morning | Status: DC

## 2013-02-25 MED ORDER — TRAMADOL HCL 50 MG PO TABS: 50.0000 mg | ORAL_TABLET | Freq: Four times a day (QID) | ORAL | Status: DC | PRN

## 2013-02-25 MED ORDER — GLUCOSE BLOOD VI STRP: ORAL_STRIP | Status: DC

## 2013-02-25 MED ORDER — ACCU-CHEK NANO SMARTVIEW W/DEVICE KIT: 1.0000 | PACK | Freq: Every morning | Status: DC

## 2013-02-25 MED ORDER — RANITIDINE HCL 300 MG PO TABS: 300.0000 mg | ORAL_TABLET | Freq: Every day | ORAL | Status: DC

## 2013-02-25 NOTE — Assessment & Plan Note (Signed)
Her gastroesophageal reflux disease symptoms have been well controlled on the Nexium. I received a notice from her insurance company to consider switching her to a much less expensive H2 blocker. I raised this issue with Krista Sosa and she was agreeable to switching to ranitidine 300 mg by mouth at night. The Nexium was discontinued and the ranitidine was written and sent to the pharmacy. We will assure that she remains asymptomatic at the followup visit.

## 2013-02-25 NOTE — Assessment & Plan Note (Addendum)
She states the etodolac was ineffective in managing her diffuse osteoarthritis. Having tried numerous other nonsteroidal anti-inflammatory classes we decided to give tramadol 50 mg by mouth every 6 hours as needed for pain a try to see if we can get better control of her diffuse osteoarthritic complaints. We will assess her symptomatic response at the return visit

## 2013-02-25 NOTE — Patient Instructions (Signed)
It was great to see you again.  You are doing much better with your diabetes and your weight!  You should be proud of yourself.  1) Remember to take the metformin twice a day.  2) Remember to only take the glipizide once a day.  3) Stop the nexium (esomeprazole) and start ranitidine (Zantac) 300 mg every night.  4) We are working on getting you a new meter today.  5) We gave you the flu shot today.  6) Tramadol 50 mg by mouth up to 4 times a day as needed for pain.  Stop the etodolac.  I will see you back on November 14th for a Pap Smear.

## 2013-02-25 NOTE — Assessment & Plan Note (Signed)
Her rash has bonded well to the triamcinolone cream. We will therefore continue this medication.

## 2013-02-25 NOTE — Assessment & Plan Note (Signed)
She states she is not mentally ready to stop smoking at this time. She feels that when she is ready she will be able to quit cold [place]. She's not interested in any pharmacologic agents to help her with her smoking cessation at this time. We will readdress this issue at the followup visit.

## 2013-02-25 NOTE — Assessment & Plan Note (Addendum)
She was willing to have the flu vaccination administered today and this was done. She is also willing to have her Pap smear done at the next visit and this was scheduled for November 14. She will think about whether or not she is interested in the zoster vaccination and I will re\re ask her at the followup visit.

## 2013-02-25 NOTE — Assessment & Plan Note (Signed)
She has been unable to check her blood sugars recently because her machine was broken. She states that she has been compliant with her medications and has been exercising daily by walking. Her weight is down 1 pound. She's been confused with her oral medications stating she's taken the glipizide twice a day and metformin only once a day. She is also only taking the Januvia every other day because it gives her headaches. Her hemoglobin A1c today was markedly improved from greater than 14 to 8.0. I congratulated her on her marked improvement in diabetic control over the last several months. I encouraged her to take the glipizide only once a day in the metformin twice a day. We will continue the Januvia every other day and treat her headaches, which I believe may also be musculoskeletal in nature. It is hoped if the headaches improve she may be able to take the Januvia on a daily basis. We will get her a new meter today and the lipids, BMP, and urine microalbumin are pending at the time of this dictation.

## 2013-02-25 NOTE — Progress Notes (Signed)
  Subjective:    Patient ID: Krista Sosa, female    DOB: Nov 26, 1950, 62 y.o.   MRN: [id number]  HPI Please see the A&P for the status of the pt's chronic medical problems.  Review of Systems  Constitutional: Positive for activity change. Negative for unexpected weight change.  HENT: Positive for neck pain and neck stiffness.   Musculoskeletal: Positive for myalgias, back pain and arthralgias. Negative for joint swelling.  Skin: Negative for rash.  Allergic/Immunologic: Negative for environmental allergies.  Neurological: Positive for headaches.      Objective:   Physical Exam  Nursing note and vitals reviewed. Constitutional: She is oriented to person, place, and time. She appears well-developed and well-nourished. No distress.  HENT:  Head: Normocephalic and atraumatic.  Eyes: Conjunctivae are normal. Right eye exhibits no discharge. Left eye exhibits no discharge. No scleral icterus.  Neck: Normal range of motion. Neck supple.  Cardiovascular: Normal rate, regular rhythm and normal heart sounds.  Exam reveals no gallop and no friction rub.   No murmur heard. Pulmonary/Chest: Effort normal and breath sounds normal. No respiratory distress. She has no wheezes. She has no rales.  Abdominal: Soft. Bowel sounds are normal. She exhibits no distension. There is no tenderness. There is no rebound and no guarding.  Musculoskeletal: Normal range of motion. She exhibits no edema and no tenderness.  Neurological: She is alert and oriented to person, place, and time. She exhibits normal muscle tone.  Skin: Skin is warm and dry. No rash noted. She is not diaphoretic. No erythema.  Psychiatric: She has a normal mood and affect. Her behavior is normal. Judgment and thought content normal.      Assessment & Plan:   Please see problem oriented charting.

## 2013-02-25 NOTE — Assessment & Plan Note (Addendum)
She states she is only required once daily albuterol recently. Her asthma is so well controlled that she has taken liberty to stop controler steroid inhaler. We will continue with the albuterol as needed and monitor if she should need to escalate the dose which would suggest she would require reinstitution of her inhaled steroid.

## 2013-02-26 LAB — MICROALBUMIN / CREATININE URINE RATIO
Microalb Creat Ratio: 45.3 mg/g — ABNORMAL HIGH (ref 0.0–30.0)
Microalb, Ur: 1.75 mg/dL (ref 0.00–1.89)

## 2013-02-28 ENCOUNTER — Encounter: Payer: Self-pay | Admitting: Internal Medicine

## 2013-02-28 NOTE — Progress Notes (Addendum)
Total cholesterol 165 Triglycerides 116 HDL 45 LDL 97  LDL is at goal.   eGFR > 89 Microalbumin/Creatinine 45.3  Persistent microalbuminuria that is unchanged from last year.  I called Ms. Page and we discussed the importance of blood pressure control to protect her kidneys.  We discussed the benefits of ACEI in diabetics with albumin in their urine.  She is not interested in ACEI therapy at this time because "she takes too much medication now", but is open to thinking about it and discussing it at the follow-up visit.

## 2013-03-23 NOTE — Telephone Encounter (Signed)
A user error has taken place: encounter opened in error, closed for administrative reasons.

## 2013-04-08 ENCOUNTER — Ambulatory Visit (INDEPENDENT_AMBULATORY_CARE_PROVIDER_SITE_OTHER): Payer: Medicare Other | Admitting: Internal Medicine

## 2013-04-08 ENCOUNTER — Encounter: Payer: Self-pay | Admitting: Internal Medicine

## 2013-04-08 ENCOUNTER — Other Ambulatory Visit (HOSPITAL_COMMUNITY)
Admission: RE | Admit: 2013-04-08 | Discharge: 2013-04-08 | Disposition: A | Discharge status: Home / Self Care | Payer: Medicare Other | Source: Ambulatory Visit | Attending: Internal Medicine | Admitting: Internal Medicine

## 2013-04-08 VITALS — BP 131/77 | HR 79 | Temp 98.1°F | Wt 176.6 lb

## 2013-04-08 DIAGNOSIS — L259 Unspecified contact dermatitis, unspecified cause: Secondary | ICD-10-CM

## 2013-04-08 DIAGNOSIS — L280 Lichen simplex chronicus: Secondary | ICD-10-CM

## 2013-04-08 DIAGNOSIS — Z124 Encounter for screening for malignant neoplasm of cervix: Secondary | ICD-10-CM | POA: Insufficient documentation

## 2013-04-08 DIAGNOSIS — E1129 Type 2 diabetes mellitus with other diabetic kidney complication: Secondary | ICD-10-CM

## 2013-04-08 DIAGNOSIS — K219 Gastro-esophageal reflux disease without esophagitis: Secondary | ICD-10-CM

## 2013-04-08 DIAGNOSIS — Z0000 Encounter for general adult medical examination without abnormal findings: Secondary | ICD-10-CM

## 2013-04-08 DIAGNOSIS — Z1151 Encounter for screening for human papillomavirus (HPV): Secondary | ICD-10-CM | POA: Insufficient documentation

## 2013-04-08 DIAGNOSIS — R809 Proteinuria, unspecified: Secondary | ICD-10-CM

## 2013-04-08 DIAGNOSIS — M159 Polyosteoarthritis, unspecified: Secondary | ICD-10-CM

## 2013-04-08 DIAGNOSIS — J45909 Unspecified asthma, uncomplicated: Secondary | ICD-10-CM

## 2013-04-08 DIAGNOSIS — F17200 Nicotine dependence, unspecified, uncomplicated: Secondary | ICD-10-CM

## 2013-04-08 DIAGNOSIS — Z720 Tobacco use: Secondary | ICD-10-CM

## 2013-04-08 MED ORDER — TRAMADOL HCL 50 MG PO TABS: 50.0000 mg | ORAL_TABLET | Freq: Four times a day (QID) | ORAL | Status: DC | PRN

## 2013-04-08 NOTE — Assessment & Plan Note (Signed)
A Pap smear was obtained today and is pending at the time of this dictation. She had a flu vaccination at the last visit. She is deferring the Zostavax at this time. A referral will be made to the [facility] for diabetic foot care.

## 2013-04-08 NOTE — Assessment & Plan Note (Signed)
Her lichenoid dermatitis is well-controlled on the triamcinolone 0.1% cream. She was asked to continue to apply this 2 times daily as necessary.

## 2013-04-08 NOTE — Assessment & Plan Note (Signed)
At the last visit her asthma was well controlled. She decided on her own to stop the steroid inhaler. We therefore continued the albuterol as needed. Since last clinic visit her breathing has markedly deteriorated. She states she has wheezing, shortness of breath, and chest tightness. She has restarted the inhaled steroid but not yet seen any improvement. She also mentions that she has found mold in her home. She is very concerned this may be contributing to her deterioration in her asthma. For this I have provided her a letter to give to the Housing Authority to ask that they address any mold in the apartment if possible given the concerns that this could be impacting upon her breathing, although I think stopping the inhaled steroid also contributed to her recent deterioration. She was encouraged to continue the Flovent one puff twice daily and the albuterol inhaler as needed.

## 2013-04-08 NOTE — Assessment & Plan Note (Signed)
She states she has been taking the medication as prescribed with the exception of the Januvia which she still takes every other day. This is an improvement over the last visit where there was some confusion over how she was to take the medication. Once this was clarified she has noticed a slight improvement in her blood sugars. We did not obtain a hemoglobin A1c at this visit as the last A1c was approximately one month ago. We will continue the glipizide XL 20 mg by mouth daily, metformin 1000 mg by mouth twice daily, and Januvia 50 mg by mouth every other day although I would prefer that she take it daily. She is up-to-date on all of her diabetic preventative care and we will recheck a hemoglobin A1c at the followup visit. As she does not have hypertension and only very mild microalbuminuria we will defer the addition of an ACE inhibitor at this time.

## 2013-04-08 NOTE — Assessment & Plan Note (Signed)
She states she's not mentally ready to quit smoking and is not interested in any pharmacologic aids. She understands this may be adversely affecting her breathing. I will readdress the issue of smoking cessation at the followup visit.

## 2013-04-08 NOTE — Patient Instructions (Signed)
It was good to see you again.  I am sorry your breathing has worsened.  1) Keep using your steroid inhaler and the as needed albuterol.  2) I wrote a letter for the housing authority to address the possible mold issue which could be worsening your asthma.  3) Please keep taking the other medications as prescribed.  4) I will place a referral to the [facility] for continuing foot care.  5) I will see you back in 3 months, sooner if necessary.

## 2013-04-08 NOTE — Progress Notes (Signed)
  Subjective:    Patient ID: Krista Sosa, female    DOB: September 18, 1950, 62 y.o.   MRN: [id number]  Gynecologic Exam The patient's pertinent negatives include no genital itching, genital lesions, genital odor, genital rash, missed menses, pelvic pain, vaginal bleeding or vaginal discharge. The patient is experiencing no pain. She is not pregnant. Associated symptoms include headaches and joint pain. Pertinent negatives include no abdominal pain, anorexia, back pain, chills, constipation, diarrhea, discolored urine, dysuria, fever, flank pain, frequency, hematuria, joint swelling, nausea, painful intercourse, rash, sore throat, urgency or vomiting. She is sexually active. It is unknown whether or not her partner has an STD. She uses tubal ligation for contraception. She is postmenopausal. There is no history of an abdominal surgery, a Cesarean section, an ectopic pregnancy, endometriosis, a gynecological surgery, menorrhagia, metrorrhagia, perineal abscess or PID.    Please see the A&P for the status of the pt's chronic medical problems.  Review of Systems  Constitutional: Negative for fever, chills, activity change, appetite change and unexpected weight change.  HENT: Positive for congestion and sinus pressure. Negative for sore throat.   Eyes: Negative for photophobia.  Respiratory: Positive for cough, chest tightness, shortness of breath and wheezing.   Cardiovascular: Positive for chest pain. Negative for leg swelling.       Sharp stabbing pain in sternal area that is intermittent, last for 1 second and occurs mainly at night  Gastrointestinal: Negative for nausea, vomiting, abdominal pain, diarrhea, constipation and anorexia.  Genitourinary: Negative for dysuria, urgency, frequency, hematuria, flank pain, vaginal discharge, pelvic pain, menorrhagia and missed menses.  Musculoskeletal: Positive for arthralgias, joint pain and myalgias. Negative for back pain, gait problem and joint swelling.   Skin: Negative for rash.  Allergic/Immunologic: Positive for environmental allergies.  Neurological: Positive for headaches.      Objective:   Physical Exam  Nursing note and vitals reviewed. Constitutional: She is oriented to person, place, and time. She appears well-developed and well-nourished. No distress.  HENT:  Head: Normocephalic and atraumatic.  Eyes: Conjunctivae are normal. No scleral icterus.  Abdominal: She exhibits no distension and no mass. There is no tenderness. There is no rebound and no guarding.  Genitourinary: Vagina normal and uterus normal. Pelvic exam was performed with patient supine. No labial fusion. There is no rash, tenderness, lesion or injury on the right labia. There is no rash, tenderness, lesion or injury on the left labia. Uterus is not deviated, not enlarged, not fixed and not tender. Cervix exhibits no motion tenderness. Right adnexum displays no mass, no tenderness and no fullness. Left adnexum displays no mass, no tenderness and no fullness. No erythema, tenderness or bleeding around the vagina. No foreign body around the vagina. No signs of injury around the vagina. No vaginal discharge found.  Musculoskeletal: Normal range of motion. She exhibits no edema and no tenderness.  Neurological: She is alert and oriented to person, place, and time. She exhibits normal muscle tone.  Skin: Skin is warm and dry. She is not diaphoretic. No erythema.  Psychiatric: She has a normal mood and affect. Her behavior is normal. Judgment and thought content normal.      Assessment & Plan:   Please see problem oriented charting.

## 2013-04-08 NOTE — Assessment & Plan Note (Signed)
She has yet to pick up the tramadol and requested that I rewrite them prescription. She was given another written prescription for tramadol 50 mg by mouth every 6 hours as needed. We will reassess the efficacy of this therapy at the followup visit.

## 2013-04-08 NOTE — Assessment & Plan Note (Signed)
At the last clinic visit she was switched from her PPI to Zantac 300 mg by mouth at night. She states this is done an excellent job at controlling her gastroesophageal reflux symptoms. We will therefore continue the Zantac at 300 mg by mouth each night.

## 2013-04-15 ENCOUNTER — Ambulatory Visit: Payer: Self-pay | Admitting: Podiatrist

## 2013-04-16 NOTE — Progress Notes (Signed)
Cervical Pap Smear: Endocervical/Transformation Zone Present.  Negative for intraepithelial lesions or malignancy.

## 2013-04-18 ENCOUNTER — Other Ambulatory Visit: Payer: Self-pay | Admitting: Internal Medicine

## 2013-04-18 DIAGNOSIS — L280 Lichen simplex chronicus: Secondary | ICD-10-CM

## 2013-04-25 ENCOUNTER — Other Ambulatory Visit: Payer: Self-pay | Admitting: *Deleted

## 2013-04-25 DIAGNOSIS — IMO0002 Reserved for concepts with insufficient information to code with codable children: Secondary | ICD-10-CM

## 2013-04-25 DIAGNOSIS — E1165 Type 2 diabetes mellitus with hyperglycemia: Secondary | ICD-10-CM

## 2013-04-25 DIAGNOSIS — E1129 Type 2 diabetes mellitus with other diabetic kidney complication: Secondary | ICD-10-CM

## 2013-04-25 NOTE — Telephone Encounter (Signed)
Rx were filled at [facility] but pt wants to get them at [facility].  Please send in new Rx including quantity, frequency and Dx. Code.

## 2013-04-26 MED ORDER — ACCU-CHEK FASTCLIX LANCETS MISC: 1.0000 | Freq: Every morning | Status: DC

## 2013-04-26 MED ORDER — GLUCOSE BLOOD VI STRP: ORAL_STRIP | Status: DC

## 2013-04-29 ENCOUNTER — Ambulatory Visit: Payer: Self-pay

## 2013-04-29 ENCOUNTER — Encounter: Payer: Self-pay | Admitting: Podiatrist

## 2013-04-29 ENCOUNTER — Ambulatory Visit (INDEPENDENT_AMBULATORY_CARE_PROVIDER_SITE_OTHER): Payer: Medicare Other | Admitting: Podiatrist

## 2013-04-29 VITALS — BP 136/79 | HR 83 | Resp 16 | Ht 67.0 in | Wt 172.0 lb

## 2013-04-29 DIAGNOSIS — R52 Pain, unspecified: Secondary | ICD-10-CM

## 2013-04-29 NOTE — Patient Instructions (Signed)
Pre-Operative Instructions  Congratulations, you have decided to take an important step to improving your quality of life.  You can be assured that the doctors of [facility] will be with you every step of the way.  1. Plan to be at the surgery center/hospital at least 1 (one) hour prior to your scheduled time unless otherwise directed by the surgical center/hospital staff.  You must have a responsible adult accompany you, remain during the surgery and drive you home.  Make sure you have directions to the surgical center/hospital and know how to get there on time. 2. For hospital based surgery you will need to obtain a history and physical form from your family physician within 1 month prior to the date of surgery- we will give you a form for you primary physician.  3. We make every effort to accommodate the date you request for surgery.  There are however, times where surgery dates or times have to be moved.  We will contact you as soon as possible if a change in schedule is required.   4. No Aspirin/Ibuprofen for one week before surgery.  If you are on aspirin, any non-steroidal anti-inflammatory medications (Mobic, Aleve, Ibuprofen) you should stop taking it 7 days prior to your surgery.  You make take Tylenol  For pain prior to surgery.  5. Medications- If you are taking daily heart and blood pressure medications, seizure, reflux, allergy, asthma, anxiety, pain or diabetes medications, make sure the surgery center/hospital is aware before the day of surgery so they may notify you which medications to take or avoid the day of surgery. 6. No food or drink after midnight the night before surgery unless directed otherwise by surgical center/hospital staff. 7. No alcoholic beverages 24 hours prior to surgery.  No smoking 24 hours prior to or 24 hours after surgery. 8. Wear loose pants or shorts- loose enough to fit over bandages, boots, and casts. 9. No slip on shoes, sneakers are best. 10. Bring  your boot with you to the surgery center/hospital.  Also bring crutches or a walker if your physician has prescribed it for you.  If you do not have this equipment, it will be provided for you after surgery. 11. If you have not been contracted by the surgery center/hospital by the day before your surgery, call to confirm the date and time of your surgery. 12. Leave-time from work may vary depending on the type of surgery you have.  Appropriate arrangements should be made prior to surgery with your employer. 13. Prescriptions will be provided immediately following surgery by your doctor.  Have these filled as soon as possible after surgery and take the medication as directed. 14. Remove nail polish on the operative foot. 15. Wash the night before surgery.  The night before surgery wash the foot and leg well with the antibacterial soap provided and water paying special attention to beneath the toenails and in between the toes.  Rinse thoroughly with water and dry well with a towel.  Perform this wash unless told not to do so by your physician.  Enclosed: 1 Ice pack (please put in freezer the night before surgery)   1 Hibiclens skin cleaner   Pre-op Instructions  If you have any questions regarding the instructions, do not hesitate to call our office.  [place]: [address] [place], [place] [address] [phone]  [place]: [address] [place], [place] [address] [phone]  [place]: [address]  [place], [place] [address] [phone]  Dr. Richard   Tuchman DPM, Dr. Norman Regal DPM Dr. Richard Sikora DPM, Dr. M. Todd Hyatt DPM, Dr. Phoenix Dresser DPM 

## 2013-04-29 NOTE — Progress Notes (Signed)
  Subjective: Sahalie presents today for a bump on the left great toe medial side. She states that it's uncomfortable and she's worried it's an ulcer. She denies any change in past medical history, medications or allergies. Previously I removed ingrown toenail from this toe several times in the toenail is finally gone however a cyst has returned it's place.  Objective: Neurovascular status is intact and unchanged. The patient has previously had symes amputation to the distal halluces to take care of ingrown toenails and they previously returned. I have cut them out and then finally not returned however there is a cyst present. X-rays are taken and reveal a bony exostosis has grown in this area as well.  Assessment: Exostosis with overlying cyst left great toe  Plan Recommended surgical excision and removal of the cyst and bony exostosis. This was discussed to be performed at [facility] on outpatient basis. The consent form was discussed with the patient and she had ample time to ask questions which I answered to the best of my ability. I will see her for the surgery and if she has any problems prior that visit she will call.  Marlowe Aschoff DPM

## 2013-05-04 ENCOUNTER — Encounter: Payer: Self-pay | Admitting: Podiatrist

## 2013-05-04 DIAGNOSIS — M898X9 Other specified disorders of bone, unspecified site: Secondary | ICD-10-CM

## 2013-05-12 ENCOUNTER — Ambulatory Visit (INDEPENDENT_AMBULATORY_CARE_PROVIDER_SITE_OTHER): Payer: Medicare Other | Admitting: Podiatrist

## 2013-05-12 ENCOUNTER — Encounter: Payer: Self-pay | Admitting: Podiatrist

## 2013-05-12 VITALS — BP 124/76 | HR 88 | Resp 12

## 2013-05-12 DIAGNOSIS — D492 Neoplasm of unspecified behavior of bone, soft tissue, and skin: Secondary | ICD-10-CM

## 2013-05-12 NOTE — Patient Instructions (Addendum)
Wear the bandaid on your toe to keep the tension off the skin across the tip where the incision was placed,  Call if you have any concerns over pain, redness or swelliing..  Today your toe looks great!

## 2013-05-12 NOTE — Progress Notes (Deleted)
   Subjective:    Patient ID: Krista Sosa, female    DOB: Jul 28, 1950, 62 y.o.   MRN: [id number]  HPI Comments: '' lt foot great toe feeling ok.''     Review of Systems     Objective:   Physical Exam        Assessment & Plan:

## 2013-05-16 ENCOUNTER — Other Ambulatory Visit: Payer: Self-pay | Admitting: Internal Medicine

## 2013-05-16 DIAGNOSIS — J302 Other seasonal allergic rhinitis: Secondary | ICD-10-CM

## 2013-05-16 DIAGNOSIS — E1129 Type 2 diabetes mellitus with other diabetic kidney complication: Secondary | ICD-10-CM

## 2013-05-17 NOTE — Progress Notes (Signed)
Subjective: Krista Sosa presents today for her 1 week followup status post excision of cyst and exostosis distal tip left hallux. Patient states it's been feeling okay. She has been keeping the dressing clean dry and intact and wearing the surgical shoe as instructed.  Objective: Excellent appearance of incision site is seen skin is well coapted with sutures in place. No redness, no swelling, no streaking, no signs of infection are present. Excellent healing noted. Cyst is excised.  Assessment: Status post excision of soft tissue cyst and bony exostosis distal tip left great toe  Plan: Sutures are removed at today's visit the patient is instructed on use of a Band-Aid on the tip of the toe. I will see her back for recheck and if she has any problems or concerns she will call. Otherwise she's able to wear whatever shoes she's comfortable.

## 2013-06-03 ENCOUNTER — Encounter: Payer: Medicare Other | Admitting: Podiatrist

## 2013-06-06 NOTE — Progress Notes (Signed)
1) Exostectomy with removal of cyst 1st toe left

## 2013-06-08 ENCOUNTER — Encounter: Payer: Medicare Other | Admitting: Podiatrist

## 2013-06-16 ENCOUNTER — Other Ambulatory Visit: Payer: Self-pay | Admitting: Internal Medicine

## 2013-06-16 ENCOUNTER — Encounter: Payer: Medicare Other | Admitting: Podiatrist

## 2013-06-16 DIAGNOSIS — R809 Proteinuria, unspecified: Principal | ICD-10-CM

## 2013-06-16 DIAGNOSIS — J45909 Unspecified asthma, uncomplicated: Secondary | ICD-10-CM

## 2013-06-16 DIAGNOSIS — E1129 Type 2 diabetes mellitus with other diabetic kidney complication: Secondary | ICD-10-CM

## 2013-06-23 ENCOUNTER — Telehealth: Payer: Self-pay | Admitting: *Deleted

## 2013-06-23 MED ORDER — KETOCONAZOLE 2 % EX CREA: 1.0000 "application " | TOPICAL_CREAM | Freq: Two times a day (BID) | CUTANEOUS | Status: DC

## 2013-06-23 NOTE — Telephone Encounter (Signed)
Refill request for Ketoconazole 2% Cream apply to itchy area of foot twice daily, okayed by Dr Valentina Lucks.  Sent electronically.

## 2013-06-24 ENCOUNTER — Encounter: Payer: Self-pay | Admitting: Podiatrist

## 2013-07-01 ENCOUNTER — Other Ambulatory Visit (HOSPITAL_COMMUNITY): Payer: Self-pay | Admitting: Internal Medicine

## 2013-07-01 DIAGNOSIS — Z1231 Encounter for screening mammogram for malignant neoplasm of breast: Secondary | ICD-10-CM

## 2013-07-05 ENCOUNTER — Ambulatory Visit (HOSPITAL_COMMUNITY): Payer: Medicare Other

## 2013-07-14 ENCOUNTER — Other Ambulatory Visit: Payer: Self-pay | Admitting: Internal Medicine

## 2013-07-14 DIAGNOSIS — E1129 Type 2 diabetes mellitus with other diabetic kidney complication: Secondary | ICD-10-CM

## 2013-07-14 DIAGNOSIS — R809 Proteinuria, unspecified: Principal | ICD-10-CM

## 2013-07-14 DIAGNOSIS — J45909 Unspecified asthma, uncomplicated: Secondary | ICD-10-CM

## 2013-07-29 ENCOUNTER — Ambulatory Visit: Payer: Medicare Other | Admitting: Internal Medicine

## 2013-08-22 ENCOUNTER — Encounter: Payer: Self-pay | Admitting: Internal Medicine

## 2013-08-22 ENCOUNTER — Ambulatory Visit (INDEPENDENT_AMBULATORY_CARE_PROVIDER_SITE_OTHER): Payer: Medicare Other | Admitting: Internal Medicine

## 2013-08-22 VITALS — BP 143/71 | HR 70 | Ht 67.0 in | Wt 166.7 lb

## 2013-08-22 DIAGNOSIS — E1129 Type 2 diabetes mellitus with other diabetic kidney complication: Secondary | ICD-10-CM

## 2013-08-22 DIAGNOSIS — R809 Proteinuria, unspecified: Secondary | ICD-10-CM

## 2013-08-22 DIAGNOSIS — J45909 Unspecified asthma, uncomplicated: Secondary | ICD-10-CM

## 2013-08-22 DIAGNOSIS — E119 Type 2 diabetes mellitus without complications: Secondary | ICD-10-CM

## 2013-08-22 LAB — POCT GLYCOSYLATED HEMOGLOBIN (HGB A1C): HEMOGLOBIN A1C: 9

## 2013-08-22 LAB — GLUCOSE, CAPILLARY: GLUCOSE-CAPILLARY: 116 mg/dL — AB (ref 70–99)

## 2013-08-22 NOTE — Patient Instructions (Signed)
Thank you for your visit today.    Please return to the internal medicine clinic in 3 month(s) or sooner if needed.      Your current medical regimen is effective;  continue present plan and take all medications as prescribed.     I have made the following additions/changes to your medications: Please make sure you take 1000mg  metformin twice daily with meals.     I have made the following referrals for you: None   Please be sure to bring all of your medications with you to every visit; this includes herbal supplements, vitamins, eye drops, and any over-the-counter medications.    Should you have any questions regarding your medications and/or any new or worsening symptoms, please be sure to call the clinic at [phone].    If you believe that you are suffering from a life threatening condition or one that may result in the loss of limb or function, then you should call 911 or proceed to the nearest Emergency Department.     A healthy lifestyle and preventative care can promote health and wellness.   Maintain regular health, dental, and eye exams.  Eat a healthy diet. Foods like vegetables, fruits, whole grains, low-fat dairy products, and lean protein foods contain the nutrients you need without too many calories. Decrease your intake of foods high in solid fats, added sugars, and salt. Get information about a proper diet from your caregiver, if necessary.  Regular physical exercise is one of the most important things you can do for your health. Most adults should get at least 150 minutes of moderate-intensity exercise (any activity that increases your heart rate and causes you to sweat) each week. In addition, most adults need muscle-strengthening exercises on 2 or more days a week.   Maintain a healthy weight. The body mass index (BMI) is a screening tool to identify possible weight problems. It provides an estimate of body fat based on height and weight. Your caregiver can  help determine your BMI, and can help you achieve or maintain a healthy weight. For adults 20 years and older:  A BMI below 18.5 is considered underweight.  A BMI of 18.5 to 24.9 is normal.  A BMI of 25 to 29.9 is considered overweight.  A BMI of 30 and above is considered obese.
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
## 2013-08-22 NOTE — Assessment & Plan Note (Addendum)
Pt reports using albuterol twice daily during allergy season and flovent twice daily.  She is not currently wheezing or SOB. -continue current regimen

## 2013-08-22 NOTE — Assessment & Plan Note (Addendum)
Lab Results  Component Value Date   HGBA1C 9.0 08/22/2013   HGBA1C 8.0 02/25/2013   HGBA1C >14.0 09/03/2012     Assessment: Diabetes control: fair control Progress toward A1C goal:  deteriorated Comments: pt was prescribed metformin 1000mg  twice daily but has only been taking 1000mg  daily due to misunderstanding that she was supposed to be taking 2000mg  total daily; she also reports eating more sweets recently and is willing to cut back and understands how to take her metformin correctly  Plan: Medications:  continue current medications: metformin 2000mg  daily, januvia 50mg  daily, glipizide XL 20mg  daily Instruction/counseling given: discussed diet Other plans: recheck HA1C in 3 months
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
## 2013-08-26 ENCOUNTER — Other Ambulatory Visit: Payer: Self-pay | Admitting: Internal Medicine

## 2013-08-29 NOTE — Progress Notes (Signed)
Case discussed with Dr. Gill at the time of the visit.  We reviewed the resident's history and exam and pertinent patient test results.  I agree with the assessment, diagnosis, and plan of care documented in the resident's note. 

## 2013-09-29 ENCOUNTER — Other Ambulatory Visit: Payer: Self-pay | Admitting: Internal Medicine

## 2013-09-29 DIAGNOSIS — J3089 Other allergic rhinitis: Principal | ICD-10-CM

## 2013-09-29 DIAGNOSIS — J302 Other seasonal allergic rhinitis: Secondary | ICD-10-CM

## 2013-10-07 ENCOUNTER — Encounter: Payer: Medicare Other | Admitting: Internal Medicine

## 2013-10-20 ENCOUNTER — Encounter: Payer: Self-pay | Admitting: Internal Medicine

## 2013-10-27 ENCOUNTER — Other Ambulatory Visit: Payer: Self-pay | Admitting: Internal Medicine

## 2013-10-27 DIAGNOSIS — L280 Lichen simplex chronicus: Secondary | ICD-10-CM

## 2013-10-28 ENCOUNTER — Encounter: Payer: Medicare Other | Admitting: Internal Medicine

## 2013-12-09 ENCOUNTER — Other Ambulatory Visit: Payer: Self-pay | Admitting: Internal Medicine

## 2013-12-09 DIAGNOSIS — E1129 Type 2 diabetes mellitus with other diabetic kidney complication: Secondary | ICD-10-CM

## 2013-12-09 DIAGNOSIS — R809 Proteinuria, unspecified: Principal | ICD-10-CM

## 2013-12-09 MED ORDER — GLIPIZIDE ER 10 MG PO TB24: 20.0000 mg | ORAL_TABLET | Freq: Every day | ORAL | Status: DC

## 2013-12-09 MED ORDER — SITAGLIPTIN PHOSPHATE 50 MG PO TABS: 50.0000 mg | ORAL_TABLET | Freq: Every day | ORAL | Status: DC

## 2013-12-09 MED ORDER — METFORMIN HCL 1000 MG PO TABS: 1000.0000 mg | ORAL_TABLET | Freq: Two times a day (BID) | ORAL | Status: DC

## 2013-12-31 ENCOUNTER — Other Ambulatory Visit: Payer: Self-pay | Admitting: Podiatrist

## 2013-12-31 ENCOUNTER — Other Ambulatory Visit: Payer: Self-pay | Admitting: Internal Medicine

## 2014-01-20 ENCOUNTER — Ambulatory Visit (INDEPENDENT_AMBULATORY_CARE_PROVIDER_SITE_OTHER): Payer: Medicare Other | Admitting: Internal Medicine

## 2014-01-20 ENCOUNTER — Encounter: Payer: Self-pay | Admitting: Internal Medicine

## 2014-01-20 VITALS — BP 137/74 | HR 76 | Temp 98.2°F | Wt 171.6 lb

## 2014-01-20 DIAGNOSIS — K219 Gastro-esophageal reflux disease without esophagitis: Secondary | ICD-10-CM

## 2014-01-20 DIAGNOSIS — J4530 Mild persistent asthma, uncomplicated: Secondary | ICD-10-CM

## 2014-01-20 DIAGNOSIS — R809 Proteinuria, unspecified: Secondary | ICD-10-CM

## 2014-01-20 DIAGNOSIS — M159 Polyosteoarthritis, unspecified: Secondary | ICD-10-CM

## 2014-01-20 DIAGNOSIS — L280 Lichen simplex chronicus: Secondary | ICD-10-CM

## 2014-01-20 DIAGNOSIS — J45909 Unspecified asthma, uncomplicated: Secondary | ICD-10-CM

## 2014-01-20 DIAGNOSIS — Z23 Encounter for immunization: Secondary | ICD-10-CM

## 2014-01-20 DIAGNOSIS — E1169 Type 2 diabetes mellitus with other specified complication: Secondary | ICD-10-CM

## 2014-01-20 DIAGNOSIS — Z1231 Encounter for screening mammogram for malignant neoplasm of breast: Secondary | ICD-10-CM

## 2014-01-20 DIAGNOSIS — E1129 Type 2 diabetes mellitus with other diabetic kidney complication: Secondary | ICD-10-CM

## 2014-01-20 DIAGNOSIS — F17200 Nicotine dependence, unspecified, uncomplicated: Secondary | ICD-10-CM

## 2014-01-20 DIAGNOSIS — L940 Localized scleroderma [morphea]: Secondary | ICD-10-CM

## 2014-01-20 DIAGNOSIS — Z720 Tobacco use: Secondary | ICD-10-CM

## 2014-01-20 LAB — GLUCOSE, CAPILLARY: Glucose-Capillary: 274 mg/dL — ABNORMAL HIGH (ref 70–99)

## 2014-01-20 LAB — POCT GLYCOSYLATED HEMOGLOBIN (HGB A1C): Hemoglobin A1C: 9.2

## 2014-01-20 MED ORDER — CYCLOBENZAPRINE HCL 5 MG PO TABS: 5.0000 mg | ORAL_TABLET | Freq: Three times a day (TID) | ORAL | Status: DC | PRN

## 2014-01-20 MED ORDER — TRAMADOL HCL 50 MG PO TABS: 50.0000 mg | ORAL_TABLET | Freq: Four times a day (QID) | ORAL | Status: DC | PRN

## 2014-01-20 NOTE — Assessment & Plan Note (Signed)
Her asthma control is much improved since the last clinic visit. She states she's been compliant with her steroid inhaler and is using the albuterol as needed. She also notes that she moved from her previous apartment, where she was concerned about mold, to another apartment. She feels that this move had more to do with the improvement in her asthma symptoms then did restarting the steroid inhaler. I encouraged her to continue with the steroid inhaler and as needed albuterol and to consider stopping smoking.

## 2014-01-20 NOTE — Assessment & Plan Note (Signed)
She is continuing the triamcinolone 0.1% cream every 12 hours as needed with good control of her dermatitis. We will therefore continue with this therapy.

## 2014-01-20 NOTE — Assessment & Plan Note (Signed)
Her hemoglobin A1c today was 9.2 which is above target and slightly higher than the 9.0 at the last visit. She admits to not following her diet and eating a lot of breads recently. She also admits to not taking her medications as prescribed. She currently is taking glipizide XL 20 mg by mouth daily, Januvia 50 mg by mouth daily, and metformin 1000 mg by mouth daily. I was glad to hear that she was taking the Januvia on a daily basis, but reminded her that it was important to take the metformin twice a day. She was therefore asked to take glipizide XL 20 mg by mouth daily, Januvia 50 mg by mouth daily, and metformin 1000 mg by mouth twice daily. She was also encouraged to decrease the breads in her diet and to exercise more now that the weather was cooling off. As her microalbuminuria is mild and she has no hypertension we decided to defer initiation of an ACE inhibitor, especially in light of her difficulty with compliance to those medications which she could truly benefit from. She is otherwise up-to-date on her preventative health maintenance with regards to her diabetes.

## 2014-01-20 NOTE — Assessment & Plan Note (Signed)
She states the tramadol has been effective in controlling some of her arthritic complaints. She does note some myalgias which she says are like muscle spasms and is interested in a muscle relaxant. She was therefore started on Flexeril 5 mg by mouth every 8 hours as needed for muscle spasms. She was warned about the sedative effects of the medication and asked to take the first dose at night in order to assess how she tolerates. We will continue the tramadol and reassess her myalgias and muscle spasms on the as needed Flexeril at the followup visit.

## 2014-01-20 NOTE — Assessment & Plan Note (Signed)
She is taking the ranitidine 300 mg by mouth on an as-needed basis with good control of her acid reflux symptoms and apparently good control of any secondary asthma it may be causing. The ranitidine will be continued at this dose.

## 2014-01-20 NOTE — Assessment & Plan Note (Signed)
She continues to smoke cigarettes despite her diabetes and asthma. We discussed the importance of smoking cessation but she states she is not mentally ready to do so at this time. I asked her to contact me when she is mentally ready as I may have interventions that may help the success of her quitting. This will be discussed at the followup visit.

## 2014-01-20 NOTE — Patient Instructions (Signed)
It was great to see you again!  1) I gave you a letter for social security to help get control of your finances back to you again.  2) Keep taking all of the medications like you have been except for the metformin (see below).  3) Your diabetes is still out of control.  Take the glipizide XL 20 mg once a day, januvia 50 mg once a day, and metformin 1000 mg twice a day.  Please note that you were only taking the metformin once a day so this is a change to twice a day.  4) Cut back some on the breads and use artificial sweetener for your coffee.  5) I gave you a flu shot today.  You will be due for a pneumonia shot when you are 67.  6) I started flexeril 5 mg every 8 hours as needed for muscle spasms.  Take the first dose at night as it may make you sleepy.  7) Keep walking.  This is good for your heart, sugar, and weight.  8) Keep thinking about quitting smoking.  When you are ready let me know as I may have some ways to help.  9) I ordered a mammogram today since it has been over 2 years and you are due.  I will see you back in 3 months, sooner if necessary.

## 2014-01-20 NOTE — Assessment & Plan Note (Signed)
She was given the seasonal influenza vaccination today and an order was placed for mammography now that she is due.

## 2014-01-20 NOTE — Progress Notes (Signed)
   Subjective:    Patient ID: Krista Sosa, female    DOB: 1951-03-23, 63 y.o.   MRN: [id number]  HPI  Please see the A&P for the status of the pt's chronic medical problems.  Review of Systems  Constitutional: Positive for activity change. Negative for appetite change.  Respiratory: Negative for cough, chest tightness, shortness of breath and wheezing.   Cardiovascular: Negative for chest pain, palpitations and leg swelling.  Gastrointestinal: Negative for nausea, vomiting, abdominal pain, diarrhea, constipation and abdominal distention.  Musculoskeletal: Positive for arthralgias and myalgias.  Skin: Negative for wound.      Objective:   Physical Exam  Nursing note and vitals reviewed. Constitutional: She is oriented to person, place, and time. She appears well-developed and well-nourished. No distress.  HENT:  Head: Normocephalic and atraumatic.  Eyes: Conjunctivae are normal. Right eye exhibits no discharge. Left eye exhibits no discharge. No scleral icterus.  Cardiovascular: Normal rate, regular rhythm and normal heart sounds.  Exam reveals no gallop and no friction rub.   No murmur heard. Pulmonary/Chest: Effort normal and breath sounds normal. No respiratory distress. She has no wheezes. She has no rales.  Abdominal: Soft. Bowel sounds are normal. She exhibits no distension. There is no tenderness. There is no rebound and no guarding.  Musculoskeletal: Normal range of motion. She exhibits no edema and no tenderness.  Neurological: She is alert and oriented to person, place, and time. She exhibits normal muscle tone.  Skin: Skin is warm and dry. No rash noted. She is not diaphoretic. No erythema.  Psychiatric: She has a normal mood and affect. Her behavior is normal. Judgment and thought content normal.      Assessment & Plan:   Please see problem oriented charting.

## 2014-01-31 ENCOUNTER — Other Ambulatory Visit: Payer: Self-pay | Admitting: Internal Medicine

## 2014-01-31 DIAGNOSIS — K219 Gastro-esophageal reflux disease without esophagitis: Secondary | ICD-10-CM

## 2014-02-08 ENCOUNTER — Ambulatory Visit (INDEPENDENT_AMBULATORY_CARE_PROVIDER_SITE_OTHER): Payer: Medicare Other | Admitting: Podiatrist

## 2014-02-08 ENCOUNTER — Ambulatory Visit (INDEPENDENT_AMBULATORY_CARE_PROVIDER_SITE_OTHER): Payer: Medicare Other

## 2014-02-08 ENCOUNTER — Encounter: Payer: Self-pay | Admitting: Podiatrist

## 2014-02-08 VITALS — BP 158/94 | HR 70 | Resp 12

## 2014-02-08 DIAGNOSIS — M898X9 Other specified disorders of bone, unspecified site: Secondary | ICD-10-CM

## 2014-02-08 DIAGNOSIS — D492 Neoplasm of unspecified behavior of bone, soft tissue, and skin: Secondary | ICD-10-CM

## 2014-02-08 DIAGNOSIS — R52 Pain, unspecified: Secondary | ICD-10-CM

## 2014-02-08 NOTE — Patient Instructions (Signed)
Pre-Operative Instructions  Congratulations, you have decided to take an important step to improving your quality of life.  You can be assured that the doctors of [facility] will be with you every step of the way.  1. Plan to be at the surgery center/hospital at least 1 (one) hour prior to your scheduled time unless otherwise directed by the surgical center/hospital staff.  You must have a responsible adult accompany you, remain during the surgery and drive you home.  Make sure you have directions to the surgical center/hospital and know how to get there on time. 2. For hospital based surgery you will need to obtain a history and physical form from your family physician within 1 month prior to the date of surgery- we will give you a form for you primary physician.  3. We make every effort to accommodate the date you request for surgery.  There are however, times where surgery dates or times have to be moved.  We will contact you as soon as possible if a change in schedule is required.   4. No Aspirin/Ibuprofen for one week before surgery.  If you are on aspirin, any non-steroidal anti-inflammatory medications (Mobic, Aleve, Ibuprofen) you should stop taking it 7 days prior to your surgery.  You make take Tylenol  For pain prior to surgery.  5. Medications- If you are taking daily heart and blood pressure medications, seizure, reflux, allergy, asthma, anxiety, pain or diabetes medications, make sure the surgery center/hospital is aware before the day of surgery so they may notify you which medications to take or avoid the day of surgery. 6. No food or drink after midnight the night before surgery unless directed otherwise by surgical center/hospital staff. 7. No alcoholic beverages 24 hours prior to surgery.  No smoking 24 hours prior to or 24 hours after surgery. 8. Wear loose pants or shorts- loose enough to fit over bandages, boots, and casts. 9. No slip on shoes, sneakers are best. 10. Bring  your boot with you to the surgery center/hospital.  Also bring crutches or a walker if your physician has prescribed it for you.  If you do not have this equipment, it will be provided for you after surgery. 11. If you have not been contracted by the surgery center/hospital by the day before your surgery, call to confirm the date and time of your surgery. 12. Leave-time from work may vary depending on the type of surgery you have.  Appropriate arrangements should be made prior to surgery with your employer. 13. Prescriptions will be provided immediately following surgery by your doctor.  Have these filled as soon as possible after surgery and take the medication as directed. 14. Remove nail polish on the operative foot. 15. Wash the night before surgery.  The night before surgery wash the foot and leg well with the antibacterial soap provided and water paying special attention to beneath the toenails and in between the toes.  Rinse thoroughly with water and dry well with a towel.  Perform this wash unless told not to do so by your physician.  Enclosed: 1 Ice pack (please put in freezer the night before surgery)   1 Hibiclens skin cleaner   Pre-op Instructions  If you have any questions regarding the instructions, do not hesitate to call our office.  [place]: [address] [place], [place] [address] [phone]  [place]: [address] [place], [place] [address] [phone]  [place]: [address]  [place], [place] [address] [phone]  Dr. Richard   Tuchman DPM, Dr. Norman Regal DPM Dr. Richard Sikora DPM, Dr. M. Todd Hyatt DPM, Dr. Tyeshia Cornforth DPM 

## 2014-02-10 ENCOUNTER — Ambulatory Visit (HOSPITAL_COMMUNITY): Payer: Medicare Other

## 2014-02-15 DIAGNOSIS — M898X9 Other specified disorders of bone, unspecified site: Secondary | ICD-10-CM

## 2014-02-17 ENCOUNTER — Telehealth: Payer: Self-pay

## 2014-02-17 NOTE — Telephone Encounter (Signed)
Spoke with pt regarding post operative status, she states that she is doing well and is managing her pain effectively. Advised to elevate and keep sterile dressing dry until her next appointment

## 2014-02-20 NOTE — Progress Notes (Addendum)
Subjective: Patient presents today for recurring cyst on the distal tip of the left second toe. We removed the cyst deeply in the office last visit and it has since returned. She states it severely painful and she wants to have this fixed.  Objective: Neurovascular status intact and unchanged. 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 Pain with direct pressure is noted the skin is very tense in this area as well.  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 The patient would like to have this done and she's been scheduled a [facility] specialty surgery center on outpatient basis. Risks benefits and alternatives to surgery were explained she accepts and the consent form was signed. She'll be seen again especially surgery center and if she has any problems or concerns she will call.

## 2014-02-22 ENCOUNTER — Ambulatory Visit (INDEPENDENT_AMBULATORY_CARE_PROVIDER_SITE_OTHER): Payer: Medicare Other | Admitting: Podiatrist

## 2014-02-22 ENCOUNTER — Ambulatory Visit (INDEPENDENT_AMBULATORY_CARE_PROVIDER_SITE_OTHER): Payer: Medicare Other

## 2014-02-22 ENCOUNTER — Encounter: Payer: Self-pay | Admitting: Podiatrist

## 2014-02-22 VITALS — BP 151/83 | HR 83 | Resp 16

## 2014-02-22 DIAGNOSIS — M898X9 Other specified disorders of bone, unspecified site: Secondary | ICD-10-CM

## 2014-02-22 MED ORDER — HYDROCODONE-ACETAMINOPHEN 5-325 MG PO TABS: 1.0000 | ORAL_TABLET | ORAL | Status: DC | PRN

## 2014-02-22 NOTE — Progress Notes (Signed)
Subjective: Patient presents today1 week status post foot surgery of the left foot. boney exostectomy and removal of cyst left distal great toe. Patient denies nausea, vomiting, fevers, chills or night sweats. Denies calf pain or tenderness to the operative side.  Objective: Neurovascular status is intact with palpable pedal pulses DP and PT bilateral at 2+ out of 4. Neurological sensation is intact and unchanged as per prior to surgery. Excellent appearance of the postoperative foot is noted. Sutures are in place. No redness, swelling or sign of infection present x-rays show removal of distal portion of the distal phalanx. Assessment: Status post exostectomy, cyst excision left great toe  Plan: Toe was redressed with a dry sterile dressing. She will be seen back in a week for suture removal and will call if any concerns or questions arise prior to that visit.

## 2014-03-02 ENCOUNTER — Encounter: Payer: Medicare Other | Admitting: Podiatrist

## 2014-03-02 ENCOUNTER — Encounter: Payer: Self-pay | Admitting: Podiatrist

## 2014-03-02 ENCOUNTER — Ambulatory Visit (INDEPENDENT_AMBULATORY_CARE_PROVIDER_SITE_OTHER): Payer: Medicare Other | Admitting: Podiatrist

## 2014-03-02 VITALS — BP 128/75 | HR 83 | Resp 14

## 2014-03-02 DIAGNOSIS — M898X9 Other specified disorders of bone, unspecified site: Secondary | ICD-10-CM

## 2014-03-02 DIAGNOSIS — Z9889 Other specified postprocedural states: Secondary | ICD-10-CM

## 2014-03-02 MED ORDER — HYDROCODONE-ACETAMINOPHEN 5-325 MG PO TABS: 1.0000 | ORAL_TABLET | ORAL | Status: DC | PRN

## 2014-03-02 NOTE — Progress Notes (Signed)
Subjective: Patient presents today 2 weeks status post foot surgery of the left foot. boney exostectomy and removal of cyst left distal great toe. Patient denies nausea, vomiting, fevers, chills or night sweats. Denies calf pain or tenderness to the operative side. States the sutures are starting the be uncomfortable.   Objective: Neurovascular status is intact with palpable pedal pulses DP and PT bilateral at 2+ out of 4. Neurological sensation is intact and unchanged as per prior to surgery. Excellent appearance of the postoperative foot is noted. Sutures are in place. No redness, swelling or sign of infection present x-rays show removal of distal portion of the distal phalanx.   Assessment: Status post exostectomy, cyst excision left great toe   Plan: removed sutures today. Toe was redressed with a bandaid and she was given instructions for aftercare. She may get the foot wet. She may wear a normal shoe.  She will be seen back in 1 months for her post op follow up.  rx for hydrocodone refilled today. She will be seen back earlier if requested.

## 2014-03-03 ENCOUNTER — Encounter: Payer: Self-pay | Admitting: Podiatrist

## 2014-03-13 ENCOUNTER — Other Ambulatory Visit: Payer: Self-pay | Admitting: Internal Medicine

## 2014-03-13 DIAGNOSIS — L280 Lichen simplex chronicus: Secondary | ICD-10-CM

## 2014-03-15 ENCOUNTER — Ambulatory Visit: Payer: Medicare Other | Admitting: Internal Medicine

## 2014-03-16 ENCOUNTER — Emergency Department (HOSPITAL_COMMUNITY)
Admission: EM | Admit: 2014-03-16 | Discharge: 2014-03-16 | Disposition: A | Discharge status: Home / Self Care | Payer: Medicare Other | Attending: Emergency Medicine | Admitting: Emergency Medicine

## 2014-03-16 ENCOUNTER — Encounter (HOSPITAL_COMMUNITY): Payer: Self-pay | Admitting: Emergency Medicine

## 2014-03-16 DIAGNOSIS — Z79899 Other long term (current) drug therapy: Secondary | ICD-10-CM | POA: Diagnosis not present

## 2014-03-16 DIAGNOSIS — Z720 Tobacco use: Secondary | ICD-10-CM | POA: Diagnosis not present

## 2014-03-16 DIAGNOSIS — J45909 Unspecified asthma, uncomplicated: Secondary | ICD-10-CM | POA: Diagnosis not present

## 2014-03-16 DIAGNOSIS — N12 Tubulo-interstitial nephritis, not specified as acute or chronic: Secondary | ICD-10-CM

## 2014-03-16 DIAGNOSIS — Z872 Personal history of diseases of the skin and subcutaneous tissue: Secondary | ICD-10-CM | POA: Insufficient documentation

## 2014-03-16 DIAGNOSIS — R110 Nausea: Secondary | ICD-10-CM | POA: Diagnosis not present

## 2014-03-16 DIAGNOSIS — Z87440 Personal history of urinary (tract) infections: Secondary | ICD-10-CM | POA: Diagnosis not present

## 2014-03-16 DIAGNOSIS — Z7951 Long term (current) use of inhaled steroids: Secondary | ICD-10-CM | POA: Insufficient documentation

## 2014-03-16 DIAGNOSIS — E663 Overweight: Secondary | ICD-10-CM | POA: Insufficient documentation

## 2014-03-16 DIAGNOSIS — Z8739 Personal history of other diseases of the musculoskeletal system and connective tissue: Secondary | ICD-10-CM | POA: Insufficient documentation

## 2014-03-16 DIAGNOSIS — Z862 Personal history of diseases of the blood and blood-forming organs and certain disorders involving the immune mechanism: Secondary | ICD-10-CM | POA: Diagnosis not present

## 2014-03-16 DIAGNOSIS — E119 Type 2 diabetes mellitus without complications: Secondary | ICD-10-CM | POA: Insufficient documentation

## 2014-03-16 DIAGNOSIS — K219 Gastro-esophageal reflux disease without esophagitis: Secondary | ICD-10-CM | POA: Diagnosis not present

## 2014-03-16 DIAGNOSIS — R109 Unspecified abdominal pain: Secondary | ICD-10-CM | POA: Diagnosis present

## 2014-03-16 DIAGNOSIS — Z8669 Personal history of other diseases of the nervous system and sense organs: Secondary | ICD-10-CM | POA: Diagnosis not present

## 2014-03-16 LAB — CBC WITH DIFFERENTIAL/PLATELET
Basophils Absolute: 0 10*3/uL (ref 0.0–0.1)
Basophils Relative: 0 % (ref 0–1)
Eosinophils Absolute: 0.2 10*3/uL (ref 0.0–0.7)
Eosinophils Relative: 2 % (ref 0–5)
HCT: 36.3 % (ref 36.0–46.0)
HEMOGLOBIN: 12.9 g/dL (ref 12.0–15.0)
LYMPHS PCT: 22 % (ref 12–46)
Lymphs Abs: 1.7 10*3/uL (ref 0.7–4.0)
MCH: 28.5 pg (ref 26.0–34.0)
MCHC: 35.5 g/dL (ref 30.0–36.0)
MCV: 80.1 fL (ref 78.0–100.0)
Monocytes Absolute: 0.6 10*3/uL (ref 0.1–1.0)
Monocytes Relative: 8 % (ref 3–12)
Neutro Abs: 5.4 10*3/uL (ref 1.7–7.7)
Neutrophils Relative %: 68 % (ref 43–77)
PLATELETS: 230 10*3/uL (ref 150–400)
RBC: 4.53 MIL/uL (ref 3.87–5.11)
RDW: 14.5 % (ref 11.5–15.5)
WBC: 7.9 10*3/uL (ref 4.0–10.5)

## 2014-03-16 LAB — URINALYSIS, ROUTINE W REFLEX MICROSCOPIC
BILIRUBIN URINE: NEGATIVE
Glucose, UA: 250 mg/dL — AB
KETONES UR: 15 mg/dL — AB
Nitrite: POSITIVE — AB
Protein, ur: 100 mg/dL — AB
SPECIFIC GRAVITY, URINE: 1.021 (ref 1.005–1.030)
UROBILINOGEN UA: 0.2 mg/dL (ref 0.0–1.0)
pH: 5.5 (ref 5.0–8.0)

## 2014-03-16 LAB — COMPREHENSIVE METABOLIC PANEL
ALK PHOS: 57 U/L (ref 39–117)
ALT: 11 U/L (ref 0–35)
AST: 10 U/L (ref 0–37)
Albumin: 3.5 g/dL (ref 3.5–5.2)
Anion gap: 14 (ref 5–15)
BILIRUBIN TOTAL: 0.4 mg/dL (ref 0.3–1.2)
BUN: 8 mg/dL (ref 6–23)
CHLORIDE: 99 meq/L (ref 96–112)
CO2: 24 meq/L (ref 19–32)
Calcium: 9.3 mg/dL (ref 8.4–10.5)
Creatinine, Ser: 0.54 mg/dL (ref 0.50–1.10)
GFR calc non Af Amer: >90 mL/min (ref >90)
GLUCOSE: 227 mg/dL — AB (ref 70–99)
POTASSIUM: 3.8 meq/L (ref 3.7–5.3)
SODIUM: 137 meq/L (ref 137–147)
Total Protein: 7.7 g/dL (ref 6.0–8.3)

## 2014-03-16 LAB — URINE MICROSCOPIC-ADD ON

## 2014-03-16 MED ORDER — CEPHALEXIN 250 MG PO CAPS
500.0000 mg | ORAL_CAPSULE | Freq: Once | ORAL | Status: AC
  Administered 2014-03-16: 500 mg via ORAL
  Filled 2014-03-16: qty 2

## 2014-03-16 MED ORDER — CEPHALEXIN 500 MG PO CAPS: 500.0000 mg | ORAL_CAPSULE | Freq: Three times a day (TID) | ORAL | Status: DC

## 2014-03-16 NOTE — Discharge Instructions (Signed)
Pyelonephritis, Adult °Pyelonephritis is a kidney infection. In general, there are 2 main types of pyelonephritis: °· Infections that come on quickly without any warning (acute pyelonephritis). °· Infections that persist for a long period of time (chronic pyelonephritis). °CAUSES  °Two main causes of pyelonephritis are: °· Bacteria traveling from the bladder to the kidney. This is a problem especially in pregnant women. The urine in the bladder can become filled with bacteria from multiple causes, including: °· Inflammation of the prostate gland (prostatitis). °· Sexual intercourse in females. °· Bladder infection (cystitis). °· Bacteria traveling from the bloodstream to the tissue part of the kidney. °Problems that may increase your risk of getting a kidney infection include: °· Diabetes. °· Kidney stones or bladder stones. °· Cancer. °· Catheters placed in the bladder. °· Other abnormalities of the kidney or ureter. °SYMPTOMS  °· Abdominal pain. °· Pain in the side or flank area. °· Fever. °· Chills. °· Upset stomach. °· Blood in the urine (dark urine). °· Frequent urination. °· Strong or persistent urge to urinate. °· Burning or stinging when urinating. °DIAGNOSIS  °Your caregiver may diagnose your kidney infection based on your symptoms. A urine sample may also be taken. °TREATMENT  °In general, treatment depends on how severe the infection is.  °· If the infection is mild and caught early, your caregiver may treat you with oral antibiotics and send you home. °· If the infection is more severe, the bacteria may have gotten into the bloodstream. This will require intravenous (IV) antibiotics and a hospital stay. Symptoms may include: °¨ High fever. °¨ Severe flank pain. °¨ Shaking chills. °· Even after a hospital stay, your caregiver may require you to be on oral antibiotics for a period of time. °· Other treatments may be required depending upon the cause of the infection. °HOME CARE INSTRUCTIONS  °· Take your  antibiotics as directed. Finish them even if you start to feel better. °· Make an appointment to have your urine checked to make sure the infection is gone. °· Drink enough fluids to keep your urine clear or pale yellow. °· Take medicines for the bladder if you have urgency and frequency of urination as directed by your caregiver. °SEEK IMMEDIATE MEDICAL CARE IF:  °· You have a fever or persistent symptoms for more than 2-3 days. °· You have a fever and your symptoms suddenly get worse. °· You are unable to take your antibiotics or fluids. °· You develop shaking chills. °· You experience extreme weakness or fainting. °· There is no improvement after 2 days of treatment. °MAKE SURE YOU: °· Understand these instructions. °· Will watch your condition. °· Will get help right away if you are not doing well or get worse. °Document Released: 05/12/2005 Document Revised: 11/11/2011 Document Reviewed: 10/16/2010 °ExitCare® Patient Information ©2015 [employer]. This information is not intended to replace advice given to you by your health care provider. Make sure you discuss any questions you have with your health care provider. ° °Urinary Tract Infection °Urinary tract infections (UTIs) can develop anywhere along your urinary tract. Your urinary tract is your body's drainage system for removing wastes and extra water. Your urinary tract includes two kidneys, two ureters, a bladder, and a urethra. Your kidneys are a pair of bean-shaped organs. Each kidney is about the size of your fist. They are located below your ribs, one on each side of your spine. °CAUSES °Infections are caused by microbes, which are microscopic organisms, including fungi, viruses, and bacteria. These   organisms are so small that they can only be seen through a microscope. Bacteria are the microbes that most commonly cause UTIs. °SYMPTOMS  °Symptoms of UTIs may vary by age and gender of the patient and by the location of the infection. Symptoms in young  women typically include a frequent and intense urge to urinate and a painful, burning feeling in the bladder or urethra during urination. Older women and men are more likely to be tired, shaky, and weak and have muscle aches and abdominal pain. A fever may mean the infection is in your kidneys. Other symptoms of a kidney infection include pain in your back or sides below the ribs, nausea, and vomiting. °DIAGNOSIS °To diagnose a UTI, your caregiver will ask you about your symptoms. Your caregiver also will ask to provide a urine sample. The urine sample will be tested for bacteria and white blood cells. White blood cells are made by your body to help fight infection. °TREATMENT  °Typically, UTIs can be treated with medication. Because most UTIs are caused by a bacterial infection, they usually can be treated with the use of antibiotics. The choice of antibiotic and length of treatment depend on your symptoms and the type of bacteria causing your infection. °HOME CARE INSTRUCTIONS °· If you were prescribed antibiotics, take them exactly as your caregiver instructs you. Finish the medication even if you feel better after you have only taken some of the medication. °· Drink enough water and fluids to keep your urine clear or pale yellow. °· Avoid caffeine, tea, and carbonated beverages. They tend to irritate your bladder. °· Empty your bladder often. Avoid holding urine for long periods of time. °· Empty your bladder before and after sexual intercourse. °· After a bowel movement, women should cleanse from front to back. Use each tissue only once. °SEEK MEDICAL CARE IF:  °· You have back pain. °· You develop a fever. °· Your symptoms do not begin to resolve within 3 days. °SEEK IMMEDIATE MEDICAL CARE IF:  °· You have severe back pain or lower abdominal pain. °· You develop chills. °· You have nausea or vomiting. °· You have continued burning or discomfort with urination. °MAKE SURE YOU:  °· Understand these  instructions. °· Will watch your condition. °· Will get help right away if you are not doing well or get worse. °Document Released: 02/19/2005 Document Revised: 11/11/2011 Document Reviewed: 06/20/2011 °ExitCare® Patient Information ©2015 [employer]. This information is not intended to replace advice given to you by your health care provider. Make sure you discuss any questions you have with your health care provider. ° °

## 2014-03-16 NOTE — ED Provider Notes (Signed)
CSN: [id number]     Arrival date & time 03/16/14  1252 History   First MD Initiated Contact with Patient 03/16/14 1551     Chief Complaint  Patient presents with  . Abdominal Pain  . Back Pain  . Vaginal Bleeding     (Consider location/radiation/quality/duration/timing/severity/associated sxs/prior Treatment) HPI 63 year old female presents with abdominal pain, dysuria, urinary frequency, and hematuria over the last couple days. She's also feeling pain in her back bilaterally. Denies a weakness or numbness. Has felt subjective fevers and chills but has not taken her temperature. Has felt intermittent nausea but no vomiting. Has been able to eat and drink. Patient rates the pain as moderate. She is taking oxycodone with good relief. After urination when she wipes she noticed some blood on the toilet paper. She's concerned as is coming from her vagina but she's not seen actual vaginal bleeding. No rectal bleeding or diarrhea. The patient has never had a UTI before.  Past Medical History  Diagnosis Date  . Persistent microalbuminuria associated with type II diabetes mellitus 05/21/2006  . Asthma, chronic 06/09/2006  . Perennial allergic rhinitis with seasonal variation 06/09/2006  . Tobacco abuse disorder 06/09/2006  . Gastroesophageal reflux disease 08/13/2011  . Osteoarthritis, generalized 05/21/2006    Diffuse: cervical spine, right shoulder, left wrist Ineffective medications: Tylenol, ASA, Naprosyn, Ibuprofen, Meloxicam    . Tubular adenoma of rectum 11/16/2008    5 mm, excised endoscopically 11/16/2008, repeat colonoscopy due July 2015   . Lichenoid dermatitis 02/13/2012    Previously treated with Triamcinolone 0.025% cream Q8-12H PRN   . Sickle cell trait 02/13/2012  . Overweight (BMI 25.0-29.9) 02/13/2012  . Latent tuberculosis     dx 03/17/08 - tx with INH X 9 months  . Urinary tract disease     JICP  . Glaucoma     Per patient report  . Cataract     Per patient report   Past  Surgical History  Procedure Laterality Date  . Foot surgery  07/31/2008    Exostectomy right hallus  . Colonoscopy  11/16/08  . Appendectomy    . Tubal ligation     Family History  Problem Relation Age of Onset  . Diabetes Mother   . Heart disease Mother 38  . Cancer Father   . Sickle cell anemia Sister   . Sickle cell anemia Brother   . Diabetes Sister   . Brain cancer Sister   . Lung cancer Brother   . Healthy Sister   . Healthy Sister   . Healthy Sister   . Healthy Daughter   . Healthy Son   . Healthy Daughter   . Healthy Daughter   . Healthy Son    History  Substance Use Topics  . Smoking status: Current Every Day Smoker -- 0.30 packs/day for 25 years    Types: Cigarettes  . Smokeless tobacco: Never Used  . Alcohol Use: Yes     Comment: occasionally   OB History   Grav Para Term Preterm Abortions TAB SAB Ect Mult Living                 Review of Systems  Constitutional: Positive for fever and chills.  Gastrointestinal: Positive for nausea and abdominal pain. Negative for vomiting, diarrhea, constipation and blood in stool.  Genitourinary: Positive for dysuria, frequency and hematuria. Negative for decreased urine volume.  Musculoskeletal: Positive for back pain.  All other systems reviewed and are negative.     Allergies  Review  of patient's allergies indicates no known allergies.  Home Medications   Prior to Admission medications   Medication Sig Start Date End Date Taking? Authorizing Provider  ACCU-CHEK FASTCLIX LANCETS MISC 1 each by Does not apply route every morning. 04/25/13   Karren Cobble, MD  albuterol (PROAIR HFA) 108 (90 BASE) MCG/ACT inhaler Inhale 2 puffs into the lungs every 6 (six) hours as needed for wheezing or shortness of breath. 07/14/13   Karren Cobble, MD  Blood Glucose Monitoring Suppl (ACCU-CHEK NANO SMARTVIEW) W/DEVICE KIT 1 each by Does not apply route every morning. 02/25/13   Karren Cobble, MD  cephALEXin (KEFLEX) 500  MG capsule Take 1 capsule (500 mg total) by mouth 3 (three) times daily. X 10 days 03/16/14   Ephraim Hamburger, MD  cyclobenzaprine (FLEXERIL) 5 MG tablet Take 1 tablet (5 mg total) by mouth every 8 (eight) hours as needed for muscle spasms. 01/20/14 01/20/15  Karren Cobble, MD  FLOVENT DISKUS 50 MCG/BLIST diskus inhaler TAKE 1 INHALATION BY MOUTH TWICE DAILY    Bartholomew Crews, MD  fluticasone (FLONASE) 50 MCG/ACT nasal spray Place 2 sprays into both nostrils daily.    Karren Cobble, MD  glipiZIDE (GLIPIZIDE XL) 10 MG 24 hr tablet Take 2 tablets (20 mg total) by mouth daily with breakfast. 12/09/13   Karren Cobble, MD  glucose blood (ACCU-CHEK SMARTVIEW) test strip Use to check blood sugar 1 time a day. 04/25/13   Karren Cobble, MD  HYDROcodone-acetaminophen (NORCO/VICODIN) 5-325 MG per tablet Take 1 tablet by mouth every 4 (four) hours as needed. 03/02/14   Bronson Ing, DPM  ketoconazole (NIZORAL) 2 % cream APPLY TO ITCHY AREA OF FOOT TWICE DAILY 01/04/14   Bronson Ing, DPM  metFORMIN (GLUCOPHAGE) 1000 MG tablet Take 1 tablet (1,000 mg total) by mouth 2 (two) times daily with a meal. 12/09/13   Karren Cobble, MD  ranitidine (ZANTAC) 300 MG tablet Take 1 tablet (300 mg total) by mouth daily as needed for heartburn. 01/31/14   Karren Cobble, MD  sitaGLIPtin (JANUVIA) 50 MG tablet Take 1 tablet (50 mg total) by mouth daily. 12/09/13   Karren Cobble, MD  traMADol (ULTRAM) 50 MG tablet Take 1 tablet (50 mg total) by mouth every 6 (six) hours as needed. 01/20/14 01/20/15  Karren Cobble, MD  triamcinolone cream (KENALOG) 0.1 % Apply topically 2 (two) times daily as needed. 03/13/14   Karren Cobble, MD   BP 135/64  Pulse 101  Temp(Src) 98.4 F (36.9 C) (Oral)  Resp 16  Ht '5\' 7"'  (1.702 m)  Wt 170 lb (77.111 kg)  BMI 26.62 kg/m2  SpO2 93%  LMP 08/27/2007 Physical Exam  Nursing note and vitals reviewed. Constitutional: She is oriented to person, place, and time. She  appears well-developed and well-nourished. No distress.  HENT:  Head: Normocephalic and atraumatic.  Right Ear: External ear normal.  Left Ear: External ear normal.  Nose: Nose normal.  Mouth/Throat: Oropharynx is clear and moist.  Eyes: Right eye exhibits no discharge. Left eye exhibits no discharge.  Cardiovascular: Normal rate, regular rhythm and normal heart sounds.   Pulmonary/Chest: Effort normal and breath sounds normal.  Abdominal: Soft. She exhibits no distension. There is no tenderness.  Mild bilateral CVA tenderness  Neurological: She is alert and oriented to person, place, and time.  Skin: Skin is warm and dry.    ED Course  Procedures (including critical care time)  Labs Review Labs Reviewed  COMPREHENSIVE METABOLIC PANEL - Abnormal; Notable for the following:    Glucose, Bld 227 (*)    All other components within normal limits  URINALYSIS, ROUTINE W REFLEX MICROSCOPIC - Abnormal; Notable for the following:    APPearance TURBID (*)    Glucose, UA 250 (*)    Hgb urine dipstick LARGE (*)    Ketones, ur 15 (*)    Protein, ur 100 (*)    Nitrite POSITIVE (*)    Leukocytes, UA MODERATE (*)    All other components within normal limits  URINE MICROSCOPIC-ADD ON - Abnormal; Notable for the following:    Squamous Epithelial / LPF MANY (*)    Bacteria, UA MANY (*)    All other components within normal limits  CBC WITH DIFFERENTIAL    Imaging Review No results found.   EKG Interpretation None      MDM   Final diagnoses:  Pyelonephritis    Patient with UTI, likely early pyelo given back pain and intermittent nausea. Here she appears well and has no elevated WBC, significant dehydration or vomiting. Her blood on toilet paper is likely from hematuria. I offered to do a pelvic exam but she declines. She at this time is stable and will try oral antibiotics and close PCP f/u. Discussed strict return precautions, including fevers, vomiting, worse pain or no improvement  in symptoms with antibiotics. She and family verbalized understanding. Able to take PO in ED prior to discharge.    Ephraim Hamburger, MD 03/16/14 [phone]

## 2014-03-16 NOTE — ED Notes (Signed)
Pt c/o lower abd pain and lower back pain with some urinary frequency and vaginal bleeding x 3 days

## 2014-03-17 ENCOUNTER — Ambulatory Visit: Payer: Medicare Other | Admitting: Internal Medicine

## 2014-03-17 ENCOUNTER — Encounter: Payer: Self-pay | Admitting: Internal Medicine

## 2014-03-21 ENCOUNTER — Encounter: Payer: Self-pay | Admitting: Podiatrist

## 2014-03-31 ENCOUNTER — Encounter: Payer: Self-pay | Admitting: Internal Medicine

## 2014-03-31 ENCOUNTER — Encounter: Payer: Medicare Other | Admitting: Podiatrist

## 2014-03-31 ENCOUNTER — Ambulatory Visit (INDEPENDENT_AMBULATORY_CARE_PROVIDER_SITE_OTHER): Payer: Medicare Other | Admitting: Internal Medicine

## 2014-03-31 VITALS — BP 142/70 | HR 86 | Temp 98.0°F | Wt 170.5 lb

## 2014-03-31 DIAGNOSIS — E119 Type 2 diabetes mellitus without complications: Secondary | ICD-10-CM

## 2014-03-31 DIAGNOSIS — E1169 Type 2 diabetes mellitus with other specified complication: Secondary | ICD-10-CM

## 2014-03-31 DIAGNOSIS — R809 Proteinuria, unspecified: Secondary | ICD-10-CM

## 2014-03-31 DIAGNOSIS — E1129 Type 2 diabetes mellitus with other diabetic kidney complication: Secondary | ICD-10-CM

## 2014-03-31 DIAGNOSIS — Z1231 Encounter for screening mammogram for malignant neoplasm of breast: Secondary | ICD-10-CM

## 2014-03-31 DIAGNOSIS — E663 Overweight: Secondary | ICD-10-CM

## 2014-03-31 DIAGNOSIS — Z0000 Encounter for general adult medical examination without abnormal findings: Secondary | ICD-10-CM

## 2014-03-31 DIAGNOSIS — N3001 Acute cystitis with hematuria: Secondary | ICD-10-CM

## 2014-03-31 DIAGNOSIS — K219 Gastro-esophageal reflux disease without esophagitis: Secondary | ICD-10-CM

## 2014-03-31 DIAGNOSIS — L280 Lichen simplex chronicus: Secondary | ICD-10-CM

## 2014-03-31 DIAGNOSIS — Z720 Tobacco use: Secondary | ICD-10-CM

## 2014-03-31 DIAGNOSIS — J4530 Mild persistent asthma, uncomplicated: Secondary | ICD-10-CM

## 2014-03-31 LAB — GLUCOSE, CAPILLARY: Glucose-Capillary: 226 mg/dL — ABNORMAL HIGH (ref 70–99)

## 2014-03-31 LAB — POCT GLYCOSYLATED HEMOGLOBIN (HGB A1C): Hemoglobin A1C: 9.8

## 2014-03-31 MED ORDER — SULFAMETHOXAZOLE-TRIMETHOPRIM 800-160 MG PO TABS: 1.0000 | ORAL_TABLET | Freq: Two times a day (BID) | ORAL | Status: DC

## 2014-03-31 NOTE — Assessment & Plan Note (Signed)
Despite continuing to smoke and intermittently taking her inhaled steroid her asthma is well controlled symptomatically. She notes only needing to occasionally take her rescue albuterol. We will continue the inhaled steroid and rescue albuterol. If her control remains excellent we may stop the inhaled steroid and go with as needed albuterol in an attempt to decrease her medication list and encourage her to be compliant with her diabetes medication as well as preventative ACE inhibition and statin as noted above. Smoking cessation was also addressed as above.

## 2014-03-31 NOTE — Assessment & Plan Note (Signed)
Her diabetes remains poorly controlled. In fact, her control is worse today than previously. Her hemoglobin A1c today was 9.8. This is up from 9.2 from the previous evaluation. Her current regimen is metformin 1000 mg once daily although it is prescribed twice daily, glipizide XL 20 mg by mouth daily, and Januvia 50 mg by mouth daily. At the last clinic visit she was encouraged to take the metformin twice a day but has failed to do so.  We had a long discussion about the risks of poorly controlled diabetes on her vision, kidneys, and heart. I am hopeful with better understanding of her disease she will be more compliant and take the metformin twice daily. I have also asked her to bring in her glucose meter logs for review as this may also help [place] manage her diabetes. She states that she checks her sugars before breakfast daily and gets readings in the 200-240 range. Given her hemoglobin A1c I am suspicious about the accuracy of this data. A referral was made for an eye examination as she missed the previous appointment. A urine microalbumin was ordered during this visit and is pending at this time. As she is a diabetic the recommendations suggest she should be on statin therapy. We discussed this and she is against adding any new medications at this time, feeling she is already taking too many. I also discussed the recommendations for ACE inhibition in diabetics in order to protect the kidney function. Her response was the same, deferring additional pharmacotherapy. At the follow-up visit we will take a close look at her medication list in hopes of removing medications that may have less indication than the statin and the ACE inhibitor. If I can pair this list down I may be able to convince her to start the statin and the ACE inhibitor. We will follow-up with a hemoglobin A1c in 3 months while she is hopefully on twice daily metformin.

## 2014-03-31 NOTE — Assessment & Plan Note (Signed)
She has not had any symptoms of heartburn or exacerbation of her asthma despite only intermittently taking her ranitidine. She was interested in a trial completely off the ranitidine and this was removed from her medication list. Her symptoms will be reassessed at the follow-up visit.

## 2014-03-31 NOTE — Assessment & Plan Note (Signed)
She has lost 1 pound since the last clinic visit. She was praised for this success and was encouraged to continue to be more active and watch her diet. I stressed the importance of weight loss and getting better control of her diabetes. We will reassess her weight at the follow-up visit.

## 2014-03-31 NOTE — Patient Instructions (Signed)
It was good to see you again.  I am sorry your urinary tract infection continues.  1) We started Bactrim DS for your urinary tract infection.  Take 1 tablet twice a day for 1 week to kill the urinary infection.  2) Try to take your metformin 1000 mg twice a day along with the daily glipizide XL and Januvia.  This is really important as your diabetes keeps getting worse and I am afraid you will start having eye, heart, and kidney complications if we do not get better control of it.  3) Bring your glucose meter in to the next appointment so we can review it.  It may help me make adjustments to your diabetes medications.  3) Take all of the other medications as you are doing.  4) I will make a referral for a mammogram.  5) Please don't forget to schedule the eye appointment.  6) When you are mentally ready to quit smoking please let me know as I may be able to help with medications for the cravings.  I will see you back in 3 months, sooner if necessary.  If the urinary symptoms do not get better after the new antibiotic, please give me a call to let me know.

## 2014-03-31 NOTE — Assessment & Plan Note (Signed)
In late October she developed frequency, gross hematuria, dysuria, and CVA tenderness. She was evaluated in the emergency department and felt to have a pyelonephritis. She was treated with Keflex for 10 days. She states that she took the full course of the Keflex but continues to have frequency and urgency. She denies CVA tenderness, dysuria, or fevers but does note continued fatigue and some headaches since developing the infection initially. We obtained a urine dip today and it demonstrated moderate blood with large leukocyte esterase consistent with persistent cystitis. She had no CVA tenderness on examination hence why do not believe it is pyelonephritis. She is a Keflex failure, therefore we started trimethoprim sulfamethoxazole double strength 1 tablet by mouth twice daily for 1 week. She was asked to call if her symptoms did not resolve. She may require a more extensive evaluation if her urinary tract infections fail to resolve or become recurrent, which is been unusual for her.

## 2014-03-31 NOTE — Assessment & Plan Note (Signed)
At the next clinic visit we will discuss her surveillance colonoscopy and order it if she has agreeable. She missed her last appointment for her mammogram because of the toe surgery she had received. This will be rescheduled. She is otherwise up-to-date on her preventative health maintenance.

## 2014-03-31 NOTE — Assessment & Plan Note (Signed)
I again asked her about smoking sensation and whether she was ready to quit. She remains in the pre-contemplative stage. I encouraged her to contact me as soon as she was ready to quit as I may be able to assist her with pharmacotherapy. I again stressed the importance of quitting with regards to her health. This will be readdressed at the follow-up visit.

## 2014-03-31 NOTE — Progress Notes (Signed)
   Subjective:    Patient ID: Krista Sosa, female    DOB: 24-Nov-1950, 63 y.o.   MRN: [id number]  HPI  Please see the A&P for the status of the pt's chronic medical problems.  Review of Systems  Constitutional: Positive for fatigue. Negative for fever, chills, diaphoresis, activity change, appetite change and unexpected weight change.  Eyes: Negative for photophobia.  Respiratory: Negative for cough, chest tightness, shortness of breath, wheezing and stridor.   Cardiovascular: Negative for chest pain, palpitations and leg swelling.  Gastrointestinal: Negative for nausea, vomiting, abdominal pain, diarrhea and constipation.  Genitourinary: Positive for urgency and frequency. Negative for dysuria, hematuria, flank pain, vaginal bleeding and difficulty urinating.  Musculoskeletal: Positive for myalgias. Negative for back pain, joint swelling, arthralgias, gait problem, neck pain and neck stiffness.  Skin: Negative for color change, rash and wound.  Neurological: Positive for headaches.      Objective:   Physical Exam  Constitutional: She is oriented to person, place, and time. She appears well-developed and well-nourished. No distress.  HENT:  Head: Normocephalic and atraumatic.  Eyes: Conjunctivae are normal. Right eye exhibits no discharge. Left eye exhibits no discharge. No scleral icterus.  Cardiovascular: Normal rate, regular rhythm and normal heart sounds.  Exam reveals no gallop and no friction rub.   No murmur heard. Pulmonary/Chest: Effort normal and breath sounds normal. No respiratory distress. She has no wheezes. She has no rales.  Abdominal: Soft. Bowel sounds are normal. She exhibits no distension. There is no tenderness. There is no rebound and no guarding.  Musculoskeletal: Normal range of motion. She exhibits no edema or tenderness.  Neurological: She is alert and oriented to person, place, and time. She exhibits normal muscle tone.  Skin: Skin is warm and dry. No  rash noted. She is not diaphoretic. No erythema.  Psychiatric: She has a normal mood and affect. Her behavior is normal. Judgment and thought content normal.  Nursing note and vitals reviewed.     Assessment & Plan:   Please see problem oriented charting.

## 2014-03-31 NOTE — Assessment & Plan Note (Signed)
Her lichenoid dermatitis is well controlled on the as needed triamcinolone cream. We will therefore continue this therapy.

## 2014-04-01 LAB — MICROALBUMIN / CREATININE URINE RATIO
Creatinine, Urine: 19.9 mg/dL
Microalb Creat Ratio: 346.7 mg/g — ABNORMAL HIGH (ref 0.0–30.0)
Microalb, Ur: 6.9 mg/dL — ABNORMAL HIGH (ref <2.0)

## 2014-04-10 MED ORDER — LISINOPRIL 10 MG PO TABS: 10.0000 mg | ORAL_TABLET | Freq: Every day | ORAL | Status: DC

## 2014-04-10 NOTE — Addendum Note (Signed)
Addended by: Oval Linsey D on: 04/10/2014 09:32 AM   Modules accepted: Orders

## 2014-04-10 NOTE — Progress Notes (Addendum)
Urine microalbumin 6.9 Urine microalbumin ratio 346.7  I notified Ms. Waren of the fact that her kidneys are starting to show some damage related to the diabetes.  I again recommended starting a medication to help protect the kidneys.  She was willing to start the lisinopril at 10 mg daily given this result.  A prescription was sent to [facility] on [address].  We will check a BMP and urine microalbumin at the follow-up visit.

## 2014-04-24 ENCOUNTER — Ambulatory Visit: Payer: Medicare Other | Admitting: Internal Medicine

## 2014-04-28 ENCOUNTER — Ambulatory Visit: Payer: Medicare Other | Admitting: Internal Medicine

## 2014-05-10 LAB — HM DIABETES EYE EXAM

## 2014-05-13 LAB — HM DIABETES EYE EXAM

## 2014-05-15 ENCOUNTER — Encounter: Payer: Self-pay | Admitting: *Deleted

## 2014-06-12 ENCOUNTER — Other Ambulatory Visit: Payer: Self-pay | Admitting: Internal Medicine

## 2014-06-14 ENCOUNTER — Other Ambulatory Visit: Payer: Self-pay | Admitting: Internal Medicine

## 2014-06-14 DIAGNOSIS — J4530 Mild persistent asthma, uncomplicated: Secondary | ICD-10-CM

## 2014-07-13 ENCOUNTER — Other Ambulatory Visit: Payer: Self-pay | Admitting: Internal Medicine

## 2014-07-13 DIAGNOSIS — L280 Lichen simplex chronicus: Secondary | ICD-10-CM

## 2014-07-25 ENCOUNTER — Ambulatory Visit (INDEPENDENT_AMBULATORY_CARE_PROVIDER_SITE_OTHER): Payer: Medicare Other | Admitting: *Deleted

## 2014-07-25 DIAGNOSIS — Z0000 Encounter for general adult medical examination without abnormal findings: Secondary | ICD-10-CM

## 2014-07-25 DIAGNOSIS — Z23 Encounter for immunization: Secondary | ICD-10-CM | POA: Diagnosis present

## 2014-07-25 DIAGNOSIS — Z111 Encounter for screening for respiratory tuberculosis: Secondary | ICD-10-CM | POA: Diagnosis not present

## 2014-07-27 LAB — TB SKIN TEST
INDURATION: 30 mm
TB SKIN TEST: POSITIVE

## 2014-07-28 ENCOUNTER — Encounter: Payer: Self-pay | Admitting: Internal Medicine

## 2014-07-28 ENCOUNTER — Ambulatory Visit (INDEPENDENT_AMBULATORY_CARE_PROVIDER_SITE_OTHER): Payer: Medicare Other | Admitting: Internal Medicine

## 2014-07-28 VITALS — BP 137/78 | HR 86 | Temp 98.7°F | Wt 166.8 lb

## 2014-07-28 DIAGNOSIS — Z23 Encounter for immunization: Secondary | ICD-10-CM | POA: Diagnosis not present

## 2014-07-28 DIAGNOSIS — J309 Allergic rhinitis, unspecified: Secondary | ICD-10-CM

## 2014-07-28 DIAGNOSIS — E119 Type 2 diabetes mellitus without complications: Secondary | ICD-10-CM

## 2014-07-28 DIAGNOSIS — E1129 Type 2 diabetes mellitus with other diabetic kidney complication: Secondary | ICD-10-CM

## 2014-07-28 DIAGNOSIS — L280 Lichen simplex chronicus: Secondary | ICD-10-CM | POA: Diagnosis not present

## 2014-07-28 DIAGNOSIS — K219 Gastro-esophageal reflux disease without esophagitis: Secondary | ICD-10-CM

## 2014-07-28 DIAGNOSIS — D128 Benign neoplasm of rectum: Secondary | ICD-10-CM

## 2014-07-28 DIAGNOSIS — R809 Proteinuria, unspecified: Secondary | ICD-10-CM

## 2014-07-28 DIAGNOSIS — J302 Other seasonal allergic rhinitis: Secondary | ICD-10-CM

## 2014-07-28 DIAGNOSIS — Z720 Tobacco use: Secondary | ICD-10-CM | POA: Diagnosis not present

## 2014-07-28 DIAGNOSIS — E663 Overweight: Secondary | ICD-10-CM

## 2014-07-28 DIAGNOSIS — J4530 Mild persistent asthma, uncomplicated: Secondary | ICD-10-CM

## 2014-07-28 DIAGNOSIS — Z0000 Encounter for general adult medical examination without abnormal findings: Secondary | ICD-10-CM

## 2014-07-28 DIAGNOSIS — J3089 Other allergic rhinitis: Secondary | ICD-10-CM

## 2014-07-28 LAB — POCT GLYCOSYLATED HEMOGLOBIN (HGB A1C): Hemoglobin A1C: 10

## 2014-07-28 LAB — GLUCOSE, CAPILLARY: Glucose-Capillary: 203 mg/dL — ABNORMAL HIGH (ref 70–99)

## 2014-07-28 MED ORDER — GLUCOSE BLOOD VI STRP: ORAL_STRIP | Status: DC

## 2014-07-28 MED ORDER — ALBUTEROL SULFATE (2.5 MG/3ML) 0.083% IN NEBU: 2.5000 mg | INHALATION_SOLUTION | Freq: Four times a day (QID) | RESPIRATORY_TRACT | Status: DC | PRN

## 2014-07-28 MED ORDER — SIMVASTATIN 20 MG PO TABS: 20.0000 mg | ORAL_TABLET | Freq: Every evening | ORAL | Status: DC

## 2014-07-28 MED ORDER — SITAGLIPTIN PHOSPHATE 100 MG PO TABS: 100.0000 mg | ORAL_TABLET | Freq: Every day | ORAL | Status: DC

## 2014-07-28 NOTE — Progress Notes (Signed)
   Subjective:    Patient ID: Krista Sosa, female    DOB: October 25, 1950, 64 y.o.   MRN: [id number]  HPI  Please see the A&P for the status of the pt's chronic medical problems.  Review of Systems  Constitutional: Negative for activity change, appetite change, fatigue and unexpected weight change.  Respiratory: Positive for cough. Negative for shortness of breath and wheezing.   Cardiovascular: Negative for leg swelling.  Gastrointestinal: Negative for nausea, vomiting, abdominal pain, diarrhea, constipation and abdominal distention.  Skin: Positive for rash.  Neurological: Negative for weakness.      Objective:   Physical Exam  Constitutional: She is oriented to person, place, and time. She appears well-developed and well-nourished. No distress.  HENT:  Head: Normocephalic and atraumatic.  Eyes: Conjunctivae are normal. Right eye exhibits no discharge. Left eye exhibits no discharge. No scleral icterus.  Cardiovascular: Normal rate, regular rhythm, normal heart sounds and intact distal pulses.  Exam reveals no gallop and no friction rub.   No murmur heard. Pulmonary/Chest: Effort normal and breath sounds normal. No respiratory distress. She has no wheezes. She has no rales. She exhibits no tenderness.  Abdominal: Soft. Bowel sounds are normal. She exhibits no distension. There is no tenderness. There is no rebound and no guarding.  Musculoskeletal: Normal range of motion. She exhibits no edema or tenderness.  Neurological: She is alert and oriented to person, place, and time. She exhibits normal muscle tone.  Skin: Skin is warm and dry. Rash noted. She is not diaphoretic. No erythema.  Psychiatric: She has a normal mood and affect. Her behavior is normal. Judgment and thought content normal.  Nursing note and vitals reviewed.     Assessment & Plan:   Please see problem oriented charting.

## 2014-07-28 NOTE — Patient Instructions (Signed)
It was good to see you again.  We still need to do some work on your diabetes.  You are doing a great job with your exercise and weight!  1) Keep taking the glipizide XL 20 mg daily and Metformin 1000 mg twice daily for your diabetes.  2) I increased your januvia to 100 mg daily to help with your sugars.  3) Keep staying active.  This will help you conitue to loose weight and get your sugars better.  4) We started simvastatin 20 mg each night for your cholesterol.  This will protect your heart.  5) We stopped your Advair today and gave you a machine for your asthma.  6) Keep taking all of the other medications like you are.  Your blood pressure looks great which will help protect your kidneys.  7) We gave you the pneumonia shot today.  8) We are setting you up for the mammogram again.  9) We will talk about the colonoscopy at the next visit.  I will see you back in 3 months for a blood sugar check.  Sooner if necessary.

## 2014-07-28 NOTE — Assessment & Plan Note (Signed)
Her lichenoid dermatitis has worsened slightly despite the triamcinolone 0.1% cream. We decided to watch this over the next 3 months before making any changes as we were concentrating on improving her diabetic control first and foremost.

## 2014-07-28 NOTE — Assessment & Plan Note (Signed)
She is not quite mentally ready to quit smoking at this time. We will readdress this issue at the follow-up visit.

## 2014-07-28 NOTE — Addendum Note (Signed)
Addended by: Marcelino Duster on: 07/28/2014 04:38 PM   Modules accepted: Orders

## 2014-07-28 NOTE — Assessment & Plan Note (Signed)
Her allergic rhinitis is well controlled on her nasal steroid therapy.

## 2014-07-28 NOTE — Assessment & Plan Note (Signed)
She received the pneumococcal 13 vaccination today. As noted above, colonoscopy will be readdressed at the follow-up visit. She missed her mammography appointment secondary to the snow and this will be rescheduled.

## 2014-07-28 NOTE — Assessment & Plan Note (Signed)
She continues to remain active and has lost an additional 4 pounds since last visit. This means that over the last 6 months she has lost a total of 5 pounds. She was praised on this excellent work and told this will benefit her overall health. She continues to seem motivated and states she we'll continue to be active.

## 2014-07-28 NOTE — Assessment & Plan Note (Signed)
She has stopped using her Advair and been using the albuterol MDI as needed. She did ask for a nebulizer machine for her treatments at home. This was ordered. She was also prescribed albuterol for the nebulizer. We will again monitor her symptoms off the maintenance steroid therapy. It was noted that should her asthma worsen she will require her maintenance inhaled steroids, which she has also stopped in the past unsuccessfully.

## 2014-07-28 NOTE — Assessment & Plan Note (Signed)
She is due for a surveillance colonoscopy to follow-up on the 5 mm tubular adenoma of the rectum excised in June 2010. I brought this issue up for discussion today and she would like to defer the decision on colonoscopy until the next visit.

## 2014-07-28 NOTE — Assessment & Plan Note (Signed)
At the last clinic visit she asked to stop the ranitidine. We did this, and despite the cessation of the H2 blocker she denies any recurrent signs or symptoms consistent with her acid reflux. We will therefore continue to monitor symptomatically.

## 2014-07-28 NOTE — Assessment & Plan Note (Signed)
Ms. Shimabukuro hemoglobin A1c was 10.0 today. This is up from 9.8 at the last visit. She states she's been compliant with the glipizide XL 20 mg by mouth daily, metformin 1000 mg by mouth twice daily, and Januvia 50 mg by mouth daily. Of note, I did receive a note from her insurance provider/pharmacy that they were concerned she was not being compliant with the glipizide XL. Nonetheless, she seemed much more interested in gathering control of her diabetes at this visit than in the previous several years. She even went as far as saying that she would now consider insulin should it be required. This was something she would have never agreed to previously. I stressed the importance of maintaining her compliance with the glipizide XL 20 mg by mouth daily and metformin 1000 mg by mouth twice daily. She is willing to increase the Januvia to 100 mg by mouth daily. She will also continue to watch her diet, remain active, and try to maintain the weight loss she is achieved over the last 6 months which is 5 pounds. If her hemoglobin A1c remains much above target despite compliance with her oral medications, diet, and activity we will likely initiate insulin therapy at the next visit. She is tolerating the lisinopril well for her microalbuminuria, she was willing to start a statin today and was placed on simvastatin 20 mg by mouth at night, and a diabetic foot examination was completed. She is otherwise up-to-date on her diabetes healthcare maintenance.

## 2014-08-08 ENCOUNTER — Telehealth: Payer: Self-pay | Admitting: Internal Medicine

## 2014-08-08 NOTE — Telephone Encounter (Signed)
Follow up with DME ORDER.  Nebulizer paperwork has already been forwarded to [facility].  If patient has any questions please have her to call [phone] and press the prompt for a Representative.

## 2014-08-14 ENCOUNTER — Other Ambulatory Visit: Payer: Self-pay | Admitting: Internal Medicine

## 2014-08-14 DIAGNOSIS — J4530 Mild persistent asthma, uncomplicated: Secondary | ICD-10-CM

## 2014-08-14 MED ORDER — ALBUTEROL SULFATE HFA 108 (90 BASE) MCG/ACT IN AERS: 1.0000 | INHALATION_SPRAY | Freq: Four times a day (QID) | RESPIRATORY_TRACT | Status: DC | PRN

## 2014-08-24 ENCOUNTER — Other Ambulatory Visit: Payer: Self-pay | Admitting: Internal Medicine

## 2014-08-24 DIAGNOSIS — Z1231 Encounter for screening mammogram for malignant neoplasm of breast: Secondary | ICD-10-CM

## 2014-08-31 ENCOUNTER — Ambulatory Visit (HOSPITAL_COMMUNITY): Payer: Medicare Other | Attending: Internal Medicine

## 2014-09-06 ENCOUNTER — Other Ambulatory Visit: Payer: Self-pay | Admitting: Internal Medicine

## 2014-09-06 DIAGNOSIS — J302 Other seasonal allergic rhinitis: Secondary | ICD-10-CM

## 2014-09-06 DIAGNOSIS — J3089 Other allergic rhinitis: Principal | ICD-10-CM

## 2014-09-08 ENCOUNTER — Ambulatory Visit (HOSPITAL_COMMUNITY): Payer: Medicare Other | Attending: Internal Medicine

## 2014-09-15 ENCOUNTER — Encounter: Payer: Self-pay | Admitting: Internal Medicine

## 2014-11-17 ENCOUNTER — Other Ambulatory Visit: Payer: Self-pay | Admitting: Internal Medicine

## 2014-11-17 DIAGNOSIS — E1129 Type 2 diabetes mellitus with other diabetic kidney complication: Secondary | ICD-10-CM

## 2014-11-17 DIAGNOSIS — R809 Proteinuria, unspecified: Principal | ICD-10-CM

## 2014-12-29 ENCOUNTER — Other Ambulatory Visit: Payer: Self-pay | Admitting: Internal Medicine

## 2014-12-29 DIAGNOSIS — J3089 Other allergic rhinitis: Secondary | ICD-10-CM

## 2014-12-29 DIAGNOSIS — J4530 Mild persistent asthma, uncomplicated: Secondary | ICD-10-CM

## 2014-12-29 DIAGNOSIS — E1129 Type 2 diabetes mellitus with other diabetic kidney complication: Secondary | ICD-10-CM

## 2014-12-29 DIAGNOSIS — R809 Proteinuria, unspecified: Principal | ICD-10-CM

## 2014-12-29 DIAGNOSIS — J302 Other seasonal allergic rhinitis: Secondary | ICD-10-CM

## 2015-01-01 ENCOUNTER — Other Ambulatory Visit: Payer: Self-pay | Admitting: Internal Medicine

## 2015-01-01 DIAGNOSIS — J4530 Mild persistent asthma, uncomplicated: Secondary | ICD-10-CM

## 2015-01-11 ENCOUNTER — Encounter: Payer: Self-pay | Admitting: Internal Medicine

## 2015-02-20 ENCOUNTER — Other Ambulatory Visit: Payer: Self-pay | Admitting: Internal Medicine

## 2015-03-30 ENCOUNTER — Other Ambulatory Visit: Payer: Self-pay | Admitting: Internal Medicine

## 2015-03-30 DIAGNOSIS — M159 Polyosteoarthritis, unspecified: Secondary | ICD-10-CM

## 2015-03-30 DIAGNOSIS — R809 Proteinuria, unspecified: Principal | ICD-10-CM

## 2015-03-30 DIAGNOSIS — L280 Lichen simplex chronicus: Secondary | ICD-10-CM

## 2015-03-30 DIAGNOSIS — E1129 Type 2 diabetes mellitus with other diabetic kidney complication: Secondary | ICD-10-CM

## 2015-04-10 ENCOUNTER — Encounter: Payer: Self-pay | Admitting: Student

## 2015-05-14 ENCOUNTER — Other Ambulatory Visit: Payer: Self-pay | Admitting: Podiatrist

## 2015-06-14 ENCOUNTER — Telehealth: Payer: Self-pay | Admitting: Internal Medicine

## 2015-06-14 NOTE — Telephone Encounter (Signed)
Call to patient to confirm appointment for 06/14/14 at 10:45 lmtcb

## 2015-06-15 ENCOUNTER — Ambulatory Visit: Payer: Medicare Other | Admitting: Internal Medicine

## 2015-07-06 ENCOUNTER — Other Ambulatory Visit: Payer: Self-pay | Admitting: Internal Medicine

## 2015-07-06 ENCOUNTER — Other Ambulatory Visit: Payer: Self-pay | Admitting: Podiatrist

## 2015-07-06 DIAGNOSIS — E1129 Type 2 diabetes mellitus with other diabetic kidney complication: Secondary | ICD-10-CM

## 2015-07-06 DIAGNOSIS — R809 Proteinuria, unspecified: Principal | ICD-10-CM

## 2015-07-09 NOTE — Telephone Encounter (Signed)
Last visit 07/28/14 Message sent to front office to schedule visit.

## 2015-07-13 ENCOUNTER — Encounter: Payer: Self-pay | Admitting: Internal Medicine

## 2015-08-03 ENCOUNTER — Other Ambulatory Visit: Payer: Self-pay | Admitting: Internal Medicine

## 2015-08-03 DIAGNOSIS — L280 Lichen simplex chronicus: Secondary | ICD-10-CM

## 2015-08-06 NOTE — Telephone Encounter (Signed)
Has not been seen in clinic in 1 year.  Needs to be seen before jar is refilled.  Prescribed an 80 gm tube to hold her until 10/19/15 appointment.  If shows at that time, and continued prescription appropriate, will represcribe jar at that time.

## 2015-09-07 ENCOUNTER — Other Ambulatory Visit: Payer: Self-pay | Admitting: Internal Medicine

## 2015-09-10 NOTE — Telephone Encounter (Signed)
Last office visit: 07/28/2014   Next appt: 10/19/2015 w/pcp

## 2015-09-25 ENCOUNTER — Other Ambulatory Visit: Payer: Self-pay | Admitting: Internal Medicine

## 2015-09-26 NOTE — Telephone Encounter (Signed)
Last appointment 07/28/2014. Future appointment 10/19/2015.

## 2015-10-19 ENCOUNTER — Other Ambulatory Visit: Payer: Self-pay | Admitting: Internal Medicine

## 2015-10-19 ENCOUNTER — Encounter: Payer: Self-pay | Admitting: Internal Medicine

## 2015-10-19 ENCOUNTER — Ambulatory Visit (INDEPENDENT_AMBULATORY_CARE_PROVIDER_SITE_OTHER): Payer: Medicare Other | Admitting: Internal Medicine

## 2015-10-19 VITALS — BP 152/70 | HR 81 | Temp 98.2°F | Wt 160.3 lb

## 2015-10-19 DIAGNOSIS — E1129 Type 2 diabetes mellitus with other diabetic kidney complication: Secondary | ICD-10-CM | POA: Diagnosis not present

## 2015-10-19 DIAGNOSIS — F17200 Nicotine dependence, unspecified, uncomplicated: Secondary | ICD-10-CM

## 2015-10-19 DIAGNOSIS — M19012 Primary osteoarthritis, left shoulder: Secondary | ICD-10-CM

## 2015-10-19 DIAGNOSIS — Z7984 Long term (current) use of oral hypoglycemic drugs: Secondary | ICD-10-CM

## 2015-10-19 DIAGNOSIS — E1165 Type 2 diabetes mellitus with hyperglycemia: Secondary | ICD-10-CM | POA: Diagnosis not present

## 2015-10-19 DIAGNOSIS — K219 Gastro-esophageal reflux disease without esophagitis: Secondary | ICD-10-CM | POA: Diagnosis not present

## 2015-10-19 DIAGNOSIS — Z1239 Encounter for other screening for malignant neoplasm of breast: Secondary | ICD-10-CM

## 2015-10-19 DIAGNOSIS — E663 Overweight: Secondary | ICD-10-CM | POA: Diagnosis not present

## 2015-10-19 DIAGNOSIS — Z0000 Encounter for general adult medical examination without abnormal findings: Secondary | ICD-10-CM

## 2015-10-19 DIAGNOSIS — M159 Polyosteoarthritis, unspecified: Secondary | ICD-10-CM

## 2015-10-19 DIAGNOSIS — Z720 Tobacco use: Secondary | ICD-10-CM

## 2015-10-19 DIAGNOSIS — Z6825 Body mass index (BMI) 25.0-25.9, adult: Secondary | ICD-10-CM

## 2015-10-19 DIAGNOSIS — Z86010 Personal history of colonic polyps: Secondary | ICD-10-CM | POA: Diagnosis not present

## 2015-10-19 DIAGNOSIS — R801 Persistent proteinuria, unspecified: Secondary | ICD-10-CM | POA: Diagnosis not present

## 2015-10-19 DIAGNOSIS — D128 Benign neoplasm of rectum: Secondary | ICD-10-CM

## 2015-10-19 DIAGNOSIS — Z23 Encounter for immunization: Secondary | ICD-10-CM | POA: Diagnosis not present

## 2015-10-19 DIAGNOSIS — M19011 Primary osteoarthritis, right shoulder: Secondary | ICD-10-CM

## 2015-10-19 DIAGNOSIS — J4530 Mild persistent asthma, uncomplicated: Secondary | ICD-10-CM | POA: Diagnosis not present

## 2015-10-19 DIAGNOSIS — Z1231 Encounter for screening mammogram for malignant neoplasm of breast: Secondary | ICD-10-CM

## 2015-10-19 DIAGNOSIS — L280 Lichen simplex chronicus: Secondary | ICD-10-CM | POA: Diagnosis not present

## 2015-10-19 DIAGNOSIS — R809 Proteinuria, unspecified: Principal | ICD-10-CM

## 2015-10-19 DIAGNOSIS — J3089 Other allergic rhinitis: Secondary | ICD-10-CM

## 2015-10-19 DIAGNOSIS — J302 Other seasonal allergic rhinitis: Secondary | ICD-10-CM

## 2015-10-19 LAB — POCT GLYCOSYLATED HEMOGLOBIN (HGB A1C): Hemoglobin A1C: 11

## 2015-10-19 LAB — GLUCOSE, CAPILLARY: GLUCOSE-CAPILLARY: 346 mg/dL — AB (ref 65–99)

## 2015-10-19 MED ORDER — ALBUTEROL SULFATE (2.5 MG/3ML) 0.083% IN NEBU: 2.5000 mg | INHALATION_SOLUTION | Freq: Four times a day (QID) | RESPIRATORY_TRACT | Status: DC | PRN

## 2015-10-19 MED ORDER — METFORMIN HCL 1000 MG PO TABS: 1000.0000 mg | ORAL_TABLET | Freq: Two times a day (BID) | ORAL | Status: DC

## 2015-10-19 MED ORDER — CYCLOBENZAPRINE HCL 5 MG PO TABS: 5.0000 mg | ORAL_TABLET | Freq: Three times a day (TID) | ORAL | Status: DC | PRN

## 2015-10-19 MED ORDER — LISINOPRIL 20 MG PO TABS: 20.0000 mg | ORAL_TABLET | Freq: Every day | ORAL | Status: DC

## 2015-10-19 MED ORDER — GLIPIZIDE ER 10 MG PO TB24: 20.0000 mg | ORAL_TABLET | Freq: Every day | ORAL | Status: DC

## 2015-10-19 MED ORDER — TRIAMCINOLONE ACETONIDE 0.025 % EX OINT: 1.0000 "application " | TOPICAL_OINTMENT | Freq: Two times a day (BID) | CUTANEOUS | Status: DC

## 2015-10-19 MED ORDER — GLUCOSE BLOOD VI STRP: ORAL_STRIP | Status: DC

## 2015-10-19 MED ORDER — SIMVASTATIN 20 MG PO TABS: 20.0000 mg | ORAL_TABLET | Freq: Every evening | ORAL | Status: DC

## 2015-10-19 MED ORDER — BECLOMETHASONE DIPROPIONATE 40 MCG/ACT IN AERS: 2.0000 | INHALATION_SPRAY | Freq: Two times a day (BID) | RESPIRATORY_TRACT | Status: DC

## 2015-10-19 MED ORDER — RANITIDINE HCL 300 MG PO TABS: 300.0000 mg | ORAL_TABLET | Freq: Every day | ORAL | Status: DC

## 2015-10-19 MED ORDER — ACCU-CHEK FASTCLIX LANCETS MISC: 1.0000 | Freq: Every morning | Status: DC

## 2015-10-19 MED ORDER — ALBUTEROL SULFATE HFA 108 (90 BASE) MCG/ACT IN AERS: 1.0000 | INHALATION_SPRAY | Freq: Four times a day (QID) | RESPIRATORY_TRACT | Status: DC | PRN

## 2015-10-19 MED ORDER — SITAGLIPTIN PHOSPHATE 100 MG PO TABS: 100.0000 mg | ORAL_TABLET | Freq: Every day | ORAL | Status: DC

## 2015-10-19 MED ORDER — FLUTICASONE PROPIONATE 50 MCG/ACT NA SUSP: 2.0000 | Freq: Every day | NASAL | Status: DC

## 2015-10-19 MED ORDER — ACETAMINOPHEN-CODEINE #3 300-30 MG PO TABS: 1.0000 | ORAL_TABLET | Freq: Three times a day (TID) | ORAL | Status: DC | PRN

## 2015-10-19 NOTE — Assessment & Plan Note (Signed)
Assessment  She has had a several month history of increased nonproductive cough and dyspnea. She also notes occasional wheezing. Over a year ago she stopped her inhaled steroid, but when seen last was doing well on as needed albuterol. She has followed this pattern before where she has stopped her inhaled steroid when her asthma is well-controlled and then subsequently re-exacerbated her asthma. I believe this is what is occurring at this time as well.   Plan  Once again I educated her on the importance of continuing with the inhaled steroid to keep her asthma under better control. She was also educated on the importance of continuing to manage her allergic rhinitis with the steroid nasal spray as this could exacerbate her asthma. She has noted acid reflux symptoms as of late, particularly with lying down and she was restarted on her ranitidine 300 mg by mouth daily which was effective in controlling her symptoms in the past. She was recently seen in urgent care clinic and was started on Qvar 40 g 2 puffs twice daily. This was renewed as was her albuterol rescue inhaler and albuterol per nebulizer. Finally, the importance of smoking cessation was discussed. We will reassess her asthma control on the inhaled steroid and as needed albuterol as well as continued therapy for her allergic rhinitis and reinitiation of therapy for her reflux.

## 2015-10-19 NOTE — Progress Notes (Signed)
   Subjective:    Patient ID: Krista Sosa, female    DOB: Feb 06, 1951, 65 y.o.   MRN: [id number]  HPI  Krista Sosa is here for follow-up of her diabetes, asthma, rhinitis, reflux, and tobacco abuse. Please see the A&P for the status of the pt's chronic medical problems.  Review of Systems  Constitutional: Negative for activity change and unexpected weight change.  HENT: Positive for congestion and postnasal drip.   Respiratory: Positive for cough, chest tightness, shortness of breath and wheezing.   Cardiovascular: Negative for chest pain, palpitations and leg swelling.  Gastrointestinal: Negative for nausea, vomiting, abdominal pain, diarrhea and constipation.  Musculoskeletal: Positive for arthralgias. Negative for myalgias and joint swelling.       Bilateral shoulder pain.  Skin: Negative for color change, rash and wound.  Allergic/Immunologic: Positive for environmental allergies.      Objective:   Physical Exam  Constitutional: She is oriented to person, place, and time. She appears well-developed and well-nourished. No distress.  HENT:  Head: Normocephalic and atraumatic.  Eyes: Conjunctivae are normal. Right eye exhibits no discharge. Left eye exhibits no discharge. No scleral icterus.  Cardiovascular: Normal rate, regular rhythm and normal heart sounds.  Exam reveals no gallop and no friction rub.   No murmur heard. Pulmonary/Chest: Effort normal and breath sounds normal. No respiratory distress. She has no wheezes. She has no rales.  Abdominal: Soft. Bowel sounds are normal. She exhibits no distension. There is no tenderness. There is no rebound and no guarding.  Musculoskeletal: Normal range of motion. She exhibits no edema or tenderness.  Neurological: She is alert and oriented to person, place, and time. She exhibits normal muscle tone.  Skin: Skin is warm and dry. No rash noted. She is not diaphoretic. No erythema.  Psychiatric: She has a normal mood and  affect. Her behavior is normal. Judgment and thought content normal.  Nursing note and vitals reviewed.     Assessment & Plan:   Please see problem oriented charting.

## 2015-10-19 NOTE — Assessment & Plan Note (Signed)
Assessment  Her diabetes continues to deteriorate. Today her hemoglobin A1c was 11.0. Her weight is down 6 pounds in the last 14 months. That being said, she admits to not watching what she eats. She also states she's been compliant with her medications including the glipizide XL 20 mg by mouth daily, metformin 1000 mg by mouth twice daily, and Januvia 100 mg by mouth daily. I have some suspicions she has not been completely compliant with these medications as she has not been seen in clinic for over a year and hinted that she has been out of town for that time.  Plan  I talked about the need to initiate basal insulin therapy given the degree of her glycemic control. She stated that she would try to do much better with her diet and oral therapy prior to starting the insulin. If at the follow-up visit her glycemic control remains out of control she stated she would consider the initiation of insulin therapy at that time but she would only take 1 shot a day. Therefore, we will continue the glipizide XL 20 mg by mouth daily, metformin 1000 mg by mouth twice daily, and Januvia 100 mg by mouth daily. She will become more active now that summer is here with walking and watch specifically what she is eating. I offered her an opportunity to meet with our diabetic nutrition educator but she states she has already done so in the past multiple times and is not interested in doing so at this time. We will reassess her diabetic control with a hemoglobin A1c at the follow-up visit in 3 months. A urine for microalbumin was obtained today and is pending at the time of this dictation. That being said, her lisinopril dose was increased to 20 mg by mouth daily.

## 2015-10-19 NOTE — Assessment & Plan Note (Signed)
Assessment  She is 2 years overdue for her surveillance colonoscopy to follow-up on a tubular adenoma which was excised from the rectum endoscopically in 2010. She is now ready to pursue surveillance colonoscopy once again.  Plan  A referral was placed for surveillance colonoscopy to follow-up on the tubular adenoma excised from the rectum in 2010.

## 2015-10-19 NOTE — Patient Instructions (Signed)
It is good to see you again!  We have a lot of work to do.  1) Keep taking the medications as you are.  2) I started ranitidine 300 mg daily for your acid reflux.  I would take this at night before you lay down.  3) I started triamcinolone ointment 0.025% to your rash twice daily.  Do not put on your face.  4) I will send a referral for a colonoscopy and mammogram.  5) Make an appointment with the eye doctor as you are due for an examination.  6) I started tylenol #3 1-2 tablets for your shoulder arthritis.  7) Your diabetes is out of control.  Work on [employer] and keep taking your diabetes medications.  8) Keep taking your other medications as you are.  9) We drew your blood and urine tests today.  10) We gave you a pneumonia shot today.  I will see you back in 3 months, sooner if necessary.

## 2015-10-19 NOTE — Assessment & Plan Note (Signed)
She was given her pneumococcal 23 vaccination today, and is interested in a screening mammography. This referral was placed. Finally, she is due for an eye exam and stated that she would get this scheduled with her ophthalmologist. Other than the Zostavax the rest of her health maintenance is up-to-date.

## 2015-10-19 NOTE — Assessment & Plan Note (Signed)
Assessment  She has lost another 6 pounds since the last visit 14 months ago and her BMI is now just over 25. She was congratulated on the work she is put into weight loss.  Plan  She was encouraged to modify her diet to avoid more carbohydrates and to continue to be active so that she can lower her BMI to less than 25. This should help with her diabetic control.

## 2015-10-19 NOTE — Assessment & Plan Note (Signed)
Assessment  She states that her lichenoid dermatitis is no longer responding to the triamcinolone 0.1% cream twice daily as needed.  Plan  We have switched to a slightly higher potency topical steroid. She will start taking triamcinolone 0.025% ointment twice daily as needed. We will reassess the efficacy of this intervention at the follow-up visit.

## 2015-10-19 NOTE — Assessment & Plan Note (Signed)
Assessment  She continues to smoke. The importance of smoking cessation was discussed in the setting of asthma for greater than 3 minutes. She states she is mentally not ready to quit smoking at this time.  Plan  We will reassess her readiness for smoking cessation at the follow-up visit and continue to offer her pharmacologic aids in smoking cessation at each visit.

## 2015-10-19 NOTE — Assessment & Plan Note (Signed)
Assessment  She states she's had some exacerbation of her allergic rhinitis. She does note that she has been using her nasal steroid although I have some concern this is not completely accurate.  Plan  The importance of continuing her Flonase 100 g daily was stressed and the allergic rhinitis was tied to her poorly controlled asthma so she can better understand the reason to control her allergic rhinitis.

## 2015-10-19 NOTE — Assessment & Plan Note (Signed)
Assessment  As noted above she has had a recurrence in her acid reflux symptoms that also coincide with the exacerbation of her asthma. She has not been on any antiacid therapy.  Plan  We will start ranitidine 300 mg by mouth each night which has been effective in controlling her acid reflux symptoms in the past. We will reassess the efficacy of this intervention at the follow-up visit.

## 2015-10-19 NOTE — Assessment & Plan Note (Signed)
Assessment  She continues to have bilateral shoulder pain. Several medications have been ineffective in the past. What was effective was hydrocodone.  Plan  We will begin Tylenol # 3 300-30 mg 1-2 tablets by mouth every 6 hours as needed for pain dispense #75 per month. We will reassess her pain control at the follow-up visit. A urine drug screen is a baseline was obtained today. If this medication is effective we will reinitiate a formal pain contract at the follow-up visit.

## 2015-10-20 LAB — BMP8+ANION GAP
Anion Gap: 18 mmol/L (ref 10.0–18.0)
BUN / CREAT RATIO: 20 (ref 12–28)
BUN: 12 mg/dL (ref 8–27)
CALCIUM: 9.6 mg/dL (ref 8.7–10.3)
CO2: 21 mmol/L (ref 18–29)
CREATININE: 0.61 mg/dL (ref 0.57–1.00)
Chloride: 97 mmol/L (ref 96–106)
GFR, EST AFRICAN AMERICAN: 112 mL/min/{1.73_m2} (ref >59)
GFR, EST NON AFRICAN AMERICAN: 97 mL/min/{1.73_m2} (ref >59)
GLUCOSE: 355 mg/dL — AB (ref 65–99)
Potassium: 4.5 mmol/L (ref 3.5–5.2)
Sodium: 136 mmol/L (ref 134–144)

## 2015-10-24 ENCOUNTER — Encounter: Payer: Self-pay | Admitting: Internal Medicine

## 2015-10-24 NOTE — Progress Notes (Signed)
BMP unremarkable except for the non-fasting glucose of 355  Urine creatinine 24.5 Urine microalbumin 28.8 Microalbumin/Creatinine ratio: 117.6  UDS pending  Persistent microalbuminuria, ACEI dose recently increased.  Will follow-up on UDS when resulted.

## 2015-10-27 LAB — MICROALBUMIN / CREATININE URINE RATIO
Creatinine, Urine: 24.5 mg/dL
MICROALB/CREAT RATIO: 117.6 mg/g{creat} — AB (ref 0.0–30.0)
Microalbumin, Urine: 28.8 ug/mL

## 2015-10-27 LAB — TOXASSURE SELECT,+ANTIDEPR,UR

## 2015-10-30 NOTE — Progress Notes (Signed)
UDS unremarkable

## 2015-11-06 ENCOUNTER — Ambulatory Visit
Admission: RE | Admit: 2015-11-06 | Discharge: 2015-11-06 | Disposition: A | Discharge status: Home / Self Care | Payer: Medicare Other | Source: Ambulatory Visit | Attending: Internal Medicine | Admitting: Internal Medicine

## 2015-11-06 DIAGNOSIS — Z1231 Encounter for screening mammogram for malignant neoplasm of breast: Secondary | ICD-10-CM

## 2015-12-06 ENCOUNTER — Ambulatory Visit (AMBULATORY_SURGERY_CENTER): Payer: Self-pay

## 2015-12-06 VITALS — Ht 65.0 in | Wt 156.4 lb

## 2015-12-06 DIAGNOSIS — Z860100 Personal history of colon polyps, unspecified: Secondary | ICD-10-CM

## 2015-12-06 NOTE — Progress Notes (Signed)
No allergies to eggs or soy No past problems with anesthesia No diet meds No home oxygen  No internet 

## 2015-12-20 ENCOUNTER — Ambulatory Visit (AMBULATORY_SURGERY_CENTER): Payer: Medicare Other | Admitting: Internal Medicine

## 2015-12-20 ENCOUNTER — Encounter: Payer: Self-pay | Admitting: Internal Medicine

## 2015-12-20 VITALS — BP 131/77 | HR 69 | Temp 96.9°F | Resp 16 | Ht 65.0 in | Wt 156.0 lb

## 2015-12-20 DIAGNOSIS — D124 Benign neoplasm of descending colon: Secondary | ICD-10-CM

## 2015-12-20 DIAGNOSIS — Z86010 Personal history of colonic polyps: Secondary | ICD-10-CM

## 2015-12-20 LAB — GLUCOSE, CAPILLARY
GLUCOSE-CAPILLARY: 246 mg/dL — AB (ref 65–99)
Glucose-Capillary: 250 mg/dL — ABNORMAL HIGH (ref 65–99)

## 2015-12-20 NOTE — Progress Notes (Signed)
Report to PACU, RN, vss, BBS= Clear.  

## 2015-12-20 NOTE — Op Note (Signed)
[facility] Patient Name: Krista Sosa Procedure Date: 12/20/2015 10:45 AM MRN: [id number] Endoscopist: Gatha Mayer , MD Age: 65 Referring MD:  Date of Birth: 1950/07/20 Gender: Female Account #: [id number] Procedure:                Colonoscopy Indications:              Surveillance: Personal history of adenomatous                            polyps on last colonoscopy 5 years ago Medicines:                Propofol per Anesthesia, Monitored Anesthesia Care Procedure:                Pre-Anesthesia Assessment:                           - Prior to the procedure, a History and Physical                            was performed, and patient medications and                            allergies were reviewed. The patient's tolerance of                            previous anesthesia was also reviewed. The risks                            and benefits of the procedure and the sedation                            options and risks were discussed with the patient.                            All questions were answered, and informed consent                            was obtained. Prior Anticoagulants: The patient has                            taken no previous anticoagulant or antiplatelet                            agents. ASA Grade Assessment: III - A patient with                            severe systemic disease. After reviewing the risks                            and benefits, the patient was deemed in                            satisfactory condition to undergo the procedure.  After obtaining informed consent, the colonoscope                            was passed under direct vision. Throughout the                            procedure, the patient's blood pressure, pulse, and                            oxygen saturations were monitored continuously. The                            Model PCF-H190L [id number]) scope was introduced        through the anus and advanced to the the cecum,                            identified by appendiceal orifice and ileocecal                            valve. The colonoscopy was performed without                            difficulty. The patient tolerated the procedure                            well. The quality of the bowel preparation was                            good. The bowel preparation used was Miralax. The                            ileocecal valve, appendiceal orifice, and rectum                            were photographed. Scope In: 10:49:13 AM Scope Out: 11:03:53 AM Scope Withdrawal Time: 0 hours 11 minutes 43 seconds  Total Procedure Duration: 0 hours 14 minutes 40 seconds  Findings:                 The perianal and digital rectal examinations were                            normal.                           Two sessile polyps were found in the descending                            colon. The polyps were 3 to 4 mm in size. These                            polyps were removed with a cold snare. Resection  and retrieval were complete. Verification of                            patient identification for the specimen was done.                            Estimated blood loss was minimal.                           Multiple diverticula were found in the sigmoid                            colon.                           The exam was otherwise without abnormality on                            direct and retroflexion views. Complications:            No immediate complications. Estimated Blood Loss:     Estimated blood loss was minimal. Impression:               - Two 3 to 4 mm polyps in the descending colon,                            removed with a cold snare. Resected and retrieved.                           - Moderate diverticulosis in the sigmoid colon.                           - The examination was otherwise normal on direct                             and retroflexion views.                           - Personal history of colonic polyps. Recommendation:           - Patient has a contact number available for                            emergencies. The signs and symptoms of potential                            delayed complications were discussed with the                            patient. Return to normal activities tomorrow.                            Written discharge instructions were provided to the                            patient.                           -  Resume previous diet.                           - Continue present medications.                           - Repeat colonoscopy is recommended. The                            colonoscopy date will be determined after pathology                            results from today's exam become available for                            review. Gatha Mayer, MD 12/20/2015 11:25:17 AM This report has been signed electronically.

## 2015-12-20 NOTE — Patient Instructions (Addendum)
I found and removed 2 tiny polyps. I will let you know pathology results and when to have another routine colonoscopy by mail.  I appreciate the opportunity to care for you. Gatha Mayer, MD, [facility]   Discharge instructions given. Handout on polyps and diverticulosis. Resume previous medications. YOU HAD AN ENDOSCOPIC PROCEDURE TODAY AT [facility]:   Refer to the procedure report that was given to you for any specific questions about what was found during the examination.  If the procedure report does not answer your questions, please call your gastroenterologist to clarify.  If you requested that your care partner not be given the details of your procedure findings, then the procedure report has been included in a sealed envelope for you to review at your convenience later.  YOU SHOULD EXPECT: Some feelings of bloating in the abdomen. Passage of more gas than usual.  Walking can help get rid of the air that was put into your GI tract during the procedure and reduce the bloating. If you had a lower endoscopy (such as a colonoscopy or flexible sigmoidoscopy) you may notice spotting of blood in your stool or on the toilet paper. If you underwent a bowel prep for your procedure, you may not have a normal bowel movement for a few days.  Please Note:  You might notice some irritation and congestion in your nose or some drainage.  This is from the oxygen used during your procedure.  There is no need for concern and it should clear up in a day or so.  SYMPTOMS TO REPORT IMMEDIATELY:   Following lower endoscopy (colonoscopy or flexible sigmoidoscopy):  Excessive amounts of blood in the stool  Significant tenderness or worsening of abdominal pains  Swelling of the abdomen that is new, acute  Fever of 100F or higher   For urgent or emergent issues, a gastroenterologist can be reached at any hour by calling [phone].   DIET: Your first meal following the procedure  should be a small meal and then it is ok to progress to your normal diet. Heavy or fried foods are harder to digest and may make you feel nauseous or bloated.  Likewise, meals heavy in dairy and vegetables can increase bloating.  Drink plenty of fluids but you should avoid alcoholic beverages for 24 hours.  ACTIVITY:  You should plan to take it easy for the rest of today and you should NOT DRIVE or use heavy machinery until tomorrow (because of the sedation medicines used during the test).    FOLLOW UP: Our staff will call the number listed on your records the next business day following your procedure to check on you and address any questions or concerns that you may have regarding the information given to you following your procedure. If we do not reach you, we will leave a message.  However, if you are feeling well and you are not experiencing any problems, there is no need to return our call.  We will assume that you have returned to your regular daily activities without incident.  If any biopsies were taken you will be contacted by phone or by letter within the next 1-3 weeks.  Please call us at [phone] if you have not heard about the biopsies in 3 weeks.    SIGNATURES/CONFIDENTIALITY: You and/or your care partner have signed paperwork which will be entered into your electronic medical record.  These signatures attest to the fact that that the information above on  your After Visit Summary has been reviewed and is understood.  Full responsibility of the confidentiality of this discharge information lies with you and/or your care-partner. 

## 2015-12-20 NOTE — Progress Notes (Signed)
Pt ate chicken noodle soup yesterday at 17:00 pt reported.  Josh Monday, CRNA was made aware of. maw

## 2015-12-20 NOTE — Progress Notes (Signed)
Called to room to assist during endoscopic procedure.  Patient ID and intended procedure confirmed with present staff. Received instructions for my participation in the procedure from the performing physician.  

## 2015-12-21 ENCOUNTER — Telehealth: Payer: Self-pay | Admitting: *Deleted

## 2015-12-21 NOTE — Telephone Encounter (Signed)
  Follow up Call-  Call back number 12/20/2015  Post procedure Call Back phone  # [phone] cell  Permission to leave phone message Yes  Some recent data might be hidden     Patient questions:  Do you have a fever, pain , or abdominal swelling? No. Pain Score  0 *  Have you tolerated food without any problems? Yes.    Have you been able to return to your normal activities? Yes.    Do you have any questions about your discharge instructions: Diet   No. Medications  No. Follow up visit  No.  Do you have questions or concerns about your Care? No.  Actions: * If pain score is 4 or above: No action needed, pain <4.

## 2015-12-28 ENCOUNTER — Encounter: Payer: Self-pay | Admitting: Internal Medicine

## 2015-12-28 DIAGNOSIS — Z86010 Personal history of colonic polyps: Secondary | ICD-10-CM

## 2015-12-28 NOTE — Progress Notes (Signed)
One dimin adenoma one mucosal polyp Recall colon 2022

## 2016-01-03 ENCOUNTER — Other Ambulatory Visit: Payer: Self-pay | Admitting: Podiatry

## 2016-01-03 ENCOUNTER — Other Ambulatory Visit: Payer: Self-pay | Admitting: Internal Medicine

## 2016-01-31 ENCOUNTER — Encounter: Payer: Medicare Other | Admitting: Internal Medicine

## 2016-02-14 ENCOUNTER — Other Ambulatory Visit: Payer: Self-pay | Admitting: Internal Medicine

## 2016-02-14 ENCOUNTER — Encounter: Payer: Self-pay | Admitting: Internal Medicine

## 2016-02-14 ENCOUNTER — Ambulatory Visit (INDEPENDENT_AMBULATORY_CARE_PROVIDER_SITE_OTHER): Payer: Commercial Managed Care - HMO | Admitting: Internal Medicine

## 2016-02-14 ENCOUNTER — Other Ambulatory Visit: Payer: Self-pay | Admitting: Podiatry

## 2016-02-14 VITALS — BP 130/67 | HR 73 | Temp 98.4°F | Wt 158.6 lb

## 2016-02-14 DIAGNOSIS — R809 Proteinuria, unspecified: Secondary | ICD-10-CM | POA: Diagnosis not present

## 2016-02-14 DIAGNOSIS — G5621 Lesion of ulnar nerve, right upper limb: Secondary | ICD-10-CM | POA: Insufficient documentation

## 2016-02-14 DIAGNOSIS — Z6826 Body mass index (BMI) 26.0-26.9, adult: Secondary | ICD-10-CM

## 2016-02-14 DIAGNOSIS — Z0000 Encounter for general adult medical examination without abnormal findings: Secondary | ICD-10-CM

## 2016-02-14 DIAGNOSIS — F17210 Nicotine dependence, cigarettes, uncomplicated: Secondary | ICD-10-CM

## 2016-02-14 DIAGNOSIS — Z720 Tobacco use: Secondary | ICD-10-CM

## 2016-02-14 DIAGNOSIS — Z23 Encounter for immunization: Secondary | ICD-10-CM

## 2016-02-14 DIAGNOSIS — J4530 Mild persistent asthma, uncomplicated: Secondary | ICD-10-CM

## 2016-02-14 DIAGNOSIS — L280 Lichen simplex chronicus: Secondary | ICD-10-CM

## 2016-02-14 DIAGNOSIS — E1129 Type 2 diabetes mellitus with other diabetic kidney complication: Secondary | ICD-10-CM

## 2016-02-14 DIAGNOSIS — Z7951 Long term (current) use of inhaled steroids: Secondary | ICD-10-CM

## 2016-02-14 DIAGNOSIS — Z7984 Long term (current) use of oral hypoglycemic drugs: Secondary | ICD-10-CM

## 2016-02-14 DIAGNOSIS — M159 Polyosteoarthritis, unspecified: Secondary | ICD-10-CM

## 2016-02-14 DIAGNOSIS — E1165 Type 2 diabetes mellitus with hyperglycemia: Secondary | ICD-10-CM

## 2016-02-14 DIAGNOSIS — K219 Gastro-esophageal reflux disease without esophagitis: Secondary | ICD-10-CM

## 2016-02-14 DIAGNOSIS — E663 Overweight: Secondary | ICD-10-CM

## 2016-02-14 HISTORY — DX: Lesion of ulnar nerve, right upper limb: G56.21

## 2016-02-14 LAB — GLUCOSE, CAPILLARY: GLUCOSE-CAPILLARY: 362 mg/dL — AB (ref 65–99)

## 2016-02-14 LAB — POCT GLYCOSYLATED HEMOGLOBIN (HGB A1C): HEMOGLOBIN A1C: 11

## 2016-02-14 MED ORDER — IBUPROFEN 800 MG PO TABS: 800.0000 mg | ORAL_TABLET | Freq: Three times a day (TID) | ORAL | 3 refills | Status: DC | PRN

## 2016-02-14 MED ORDER — CANAGLIFLOZIN 100 MG PO TABS: 100.0000 mg | ORAL_TABLET | Freq: Every day | ORAL | 11 refills | Status: DC

## 2016-02-14 MED ORDER — GABAPENTIN 300 MG PO CAPS: 300.0000 mg | ORAL_CAPSULE | Freq: Every day | ORAL | 3 refills | Status: DC

## 2016-02-14 NOTE — Assessment & Plan Note (Signed)
Assessment  She states her asthma is symptomatically well controlled on the Qvar 2 puffs twice daily, monteleukast 10 mg by mouth daily, and as needed albuterol.  Plan  We will continue the Qvar, monteleukast, and albuterol at the current doses. We will reassess her symptomatic control of her asthma at the follow-up visit.

## 2016-02-14 NOTE — Assessment & Plan Note (Signed)
Assessment  She notes over the last couple of months she is developing right sided hand pain that is characterized as a numbness and tingling and located along the pinky side of the hand. She denies any pain at the elbow or neck and states she's not had any recent trauma to these areas. The sensation is keeping her up at night and she is interested in some kind of therapy. My suspicion from the distribution of her complaints is that this is consistent with a right upper extremity ulnar neuropathy.  Plan  We will start gabapentin 300 mg by mouth at night and reassess her symptoms on this therapy at the follow-up visit. It is possible that if she has some response to this medication she may require titration upwards.

## 2016-02-14 NOTE — Progress Notes (Signed)
   Subjective:    Patient ID: Krista Sosa, female    DOB: Nov 22, 1950, 65 y.o.   MRN: [id number]  HPI  KELAIA INKS is here for follow-up of her diabetes, asthma, osteoarthritis, and tobacco use. Please see the A&P for the status of the pt's chronic medical problems.  Review of Systems  Constitutional: Negative for activity change, appetite change and unexpected weight change.  Respiratory: Negative for cough, chest tightness, shortness of breath and wheezing.   Cardiovascular: Negative for chest pain, palpitations and leg swelling.  Gastrointestinal: Negative for abdominal distention, abdominal pain, constipation and diarrhea.  Musculoskeletal: Positive for arthralgias. Negative for joint swelling and myalgias.  Skin: Negative for color change and wound.      Objective:   Physical Exam  Constitutional: She is oriented to person, place, and time. She appears well-developed and well-nourished. No distress.  HENT:  Head: Normocephalic and atraumatic.  Eyes: Conjunctivae are normal. Right eye exhibits no discharge. Left eye exhibits no discharge. No scleral icterus.  Cardiovascular: Normal rate, regular rhythm and normal heart sounds.  Exam reveals no gallop and no friction rub.   No murmur heard. Pulmonary/Chest: Effort normal and breath sounds normal. No respiratory distress. She has no wheezes. She has no rales.  Abdominal: Soft. Bowel sounds are normal. She exhibits no distension. There is no tenderness. There is no rebound and no guarding.  Musculoskeletal: Normal range of motion. She exhibits no edema, tenderness or deformity.  Neurological: She is alert and oriented to person, place, and time. She exhibits normal muscle tone.  Skin: Skin is warm and dry. No rash noted. She is not diaphoretic. No erythema.  Psychiatric: She has a normal mood and affect. Her behavior is normal. Judgment and thought content normal.  Nursing note and vitals reviewed.     Assessment & Plan:     Please see problem oriented charting.

## 2016-02-14 NOTE — Assessment & Plan Note (Signed)
Assessment  Her gastroesophageal reflux symptoms responded well to the ranitidine 300 mg by mouth at night. She stopped taking this medication and has not had a recurrence of her symptoms.  Plan  We discontinue the ranitidine from her medication list and will monitor her for recurrence of symptoms of her esophageal reflux disease. If it does recur at least we know she is responsive to ranitidine 300 milligrams by mouth at night.

## 2016-02-14 NOTE — Assessment & Plan Note (Signed)
Assessment  Her generalized osteoarthritis did not respond to the Tylenol 3 and she did not like the way it made her feel. She was interested in returning back to the ibuprofen 800 mg by mouth every 8 hours as needed for pain as this was effective in the past.  Plan  We started ibuprofen 800 mg by mouth every 8 hours as needed for pain for her osteoarthritis. We will reassess the efficacy of this therapy at the follow-up visit.

## 2016-02-14 NOTE — Assessment & Plan Note (Signed)
Assessment  She put on 3 pounds since the last visit. This did not help with her diabetic control. We discussed the importance of further weight loss given the diabetes that is poorly controlled at this time.  Plan  She will try to continue to watch her diet and hopefully lose some weight by the next visit. We will reassess her weight at follow-up.

## 2016-02-14 NOTE — Patient Instructions (Signed)
It was great to see you again.  1) Your diabetes is out of control.  This is likely because you are not taking metformin anymore.  2) We are starting canagliflozin 100 mg every morning.  Also keep taking the glipizide and the [place].  3) Start the gabapentin 300 mg nightly for the right hand pain.  4) Start the ibuprofen 800 mg every 8 hours as needed for pain.  Stop the tylenol #3 with codeine.  5) Do not forget to get your eye examination.  6) Keep taking your other medications as you are.  7) We gave you the flu shot today.  I will see you back in 3 months, sooner if necessary.

## 2016-02-14 NOTE — Assessment & Plan Note (Signed)
Assessment  Her diabetes remains very poorly controlled with a hemoglobin A1c today of 11.0 which is unchanged from 4 months ago. She states she is taking her glipizide XL 20 mg daily and Januvia 100 mg by mouth daily but stopped taking her metformin several weeks ago because she heard it could cause side effects. I asked her if she was experiencing any side effects, and what were the side effects that she heard about. She was unable to answer the question about the side effects she heard about and did not complain of any particular side effects from the metformin. We discussed the importance of diabetic control and the fact that metformin is a very safe medication with very few side effects and people who have been tolerating it well she had. I advised her that poor control of her diabetes was more likely to result in side effects than the metformin which she had previously been tolerating well. She wanted to try a different class of oral medication.  Plan  We will continue the glipizide XL 20 mg by mouth daily and Januvia 100 mg by mouth daily. We will add Canagliflozin 100 mg by mouth in the morning and reassess her diabetic control at the follow-up visit. I very much expect that we will have to titrate up the dose of the Canagliflozin at the follow-up visit. She was reminded to schedule an ophthalmologic examination for a diabetic retinal exam for which she is due. She stated she would do this. With regards to her persistent microalbuminuria we will continue the lisinopril at 20 mg by mouth daily. Her glycemic control will be reassessed by hemoglobin A1c at the follow-up visit.

## 2016-02-14 NOTE — Assessment & Plan Note (Signed)
Assessment  She continues to be uninterested in quitting smoking at this time. We spent at least 3 minutes discussing the importance of smoking cessation given her underlying diabetes and asthma.  Plan  I asked her to contact me if she changed her mind and was ready to quit as I may be able to provide her with some pharmacologic help. We will reassess her interest in smoking cessation at the follow-up visit.

## 2016-02-14 NOTE — Assessment & Plan Note (Signed)
Assessment  Her lichenoid dermatitis has improved with the switch to triamcinolone 0.025% ointment twice daily as needed.  Plan  We will continue the triamcinolone 0.025% ointment twice daily as needed and reassess her lichenoid dermatitis at the follow-up visit.

## 2016-02-14 NOTE — Assessment & Plan Note (Signed)
She received the flu vaccination today. We will discuss the Zostavax at the follow-up visit. She is otherwise up-to-date on her health care maintenance.

## 2016-02-22 ENCOUNTER — Emergency Department (HOSPITAL_COMMUNITY): Payer: Commercial Managed Care - HMO

## 2016-02-22 ENCOUNTER — Encounter (HOSPITAL_COMMUNITY): Payer: Self-pay | Admitting: Emergency Medicine

## 2016-02-22 ENCOUNTER — Emergency Department (HOSPITAL_COMMUNITY)
Admission: EM | Admit: 2016-02-22 | Discharge: 2016-02-23 | Disposition: A | Discharge status: Home / Self Care | Payer: Commercial Managed Care - HMO | Attending: Emergency Medicine | Admitting: Emergency Medicine

## 2016-02-22 DIAGNOSIS — I10 Essential (primary) hypertension: Secondary | ICD-10-CM | POA: Diagnosis not present

## 2016-02-22 DIAGNOSIS — W228XXA Striking against or struck by other objects, initial encounter: Secondary | ICD-10-CM | POA: Diagnosis not present

## 2016-02-22 DIAGNOSIS — M79672 Pain in left foot: Secondary | ICD-10-CM | POA: Diagnosis not present

## 2016-02-22 DIAGNOSIS — S99922A Unspecified injury of left foot, initial encounter: Secondary | ICD-10-CM | POA: Diagnosis present

## 2016-02-22 DIAGNOSIS — J45909 Unspecified asthma, uncomplicated: Secondary | ICD-10-CM | POA: Diagnosis not present

## 2016-02-22 DIAGNOSIS — Y929 Unspecified place or not applicable: Secondary | ICD-10-CM | POA: Insufficient documentation

## 2016-02-22 DIAGNOSIS — Y999 Unspecified external cause status: Secondary | ICD-10-CM | POA: Insufficient documentation

## 2016-02-22 DIAGNOSIS — F17210 Nicotine dependence, cigarettes, uncomplicated: Secondary | ICD-10-CM | POA: Diagnosis not present

## 2016-02-22 DIAGNOSIS — S9032XA Contusion of left foot, initial encounter: Secondary | ICD-10-CM | POA: Diagnosis not present

## 2016-02-22 DIAGNOSIS — Y939 Activity, unspecified: Secondary | ICD-10-CM | POA: Insufficient documentation

## 2016-02-22 MED ORDER — TRAMADOL HCL 50 MG PO TABS
50.0000 mg | ORAL_TABLET | Freq: Once | ORAL | Status: AC
  Administered 2016-02-22: 50 mg via ORAL
  Filled 2016-02-22: qty 1

## 2016-02-22 MED ORDER — TRAMADOL HCL 50 MG PO TABS: 50.0000 mg | ORAL_TABLET | Freq: Four times a day (QID) | ORAL | 0 refills | Status: DC | PRN

## 2016-02-22 MED ORDER — ACETAMINOPHEN 325 MG PO TABS
650.0000 mg | ORAL_TABLET | Freq: Once | ORAL | Status: DC
  Filled 2016-02-22: qty 2

## 2016-02-22 NOTE — ED Provider Notes (Signed)
[facility] DEPT Provider Note   CSN: [id number] Arrival date & time: 02/22/16  2200  By signing my name below, I, Higinio Plan, attest that this documentation has been prepared under the direction and in the presence of non-physician practitioner, Domenic Moras, PA-C. Electronically Signed: Higinio Plan, Scribe. 02/22/2016. 10:50 PM.  History   Chief Complaint Chief Complaint  Patient presents with  . Foot Pain   The history is provided by the patient. No language interpreter was used.   HPI Comments: Krista Sosa is a 65 y.o. female with PMHx of osteoarthritis, who presents to the Emergency Department complaining of gradually worsening, foot pain s/p an injury that occurred at ~6:00 PM this evening. Pt reports she was attempting to set up curtains in her home this evening when she laid a hammer down onto the curtains on the floor. She states she picked up the curtains but forgot that the hammer was lying on top and the hammer fell onto her left foot. She states she has been unable to ambulate since this incident due to pain. Pt states she has applied goodies powder with no relief.   Past Medical History:  Diagnosis Date  . Allergy   . Asthma, chronic 06/09/2006  . Cataract    bil eyes  . Gastroesophageal reflux disease 08/13/2011  . Glaucoma    Per patient report  . Hx of adenomatous colonic polyps 11/16/2008  . Hypertension    pt said she is supposed to take med but she is not taking it.  . Latent tuberculosis    dx 03/17/08 - tx with INH X 9 months  . Lichenoid dermatitis 02/13/2012   Previously treated with Triamcinolone 0.025% cream Q8-12H PRN   . Osteoarthritis, generalized 05/21/2006   Diffuse: cervical spine, right shoulder, left wrist Ineffective medications: Tylenol, ASA, Naprosyn, Ibuprofen, Meloxicam    . Overweight (BMI 25.0-29.9) 02/13/2012  . Perennial allergic rhinitis with seasonal variation 06/09/2006  . Persistent microalbuminuria associated with type II diabetes  mellitus ([place]) 05/21/2006  . Sickle cell anemia (HCC)    pt has sickle cell trait per pt  . Sickle cell trait ([place]) 02/13/2012  . Tobacco abuse disorder 06/09/2006  . Tubular adenoma of rectum 11/16/2008   5 mm, excised endoscopically 11/16/2008, repeat colonoscopy due July 2015   . Urinary tract disease    JICP    Patient Active Problem List   Diagnosis Date Noted  . Ulnar neuropathy of right upper extremity 02/14/2016  . Healthcare maintenance 09/03/2012  . Lichenoid dermatitis 02/13/2012  . Sickle cell trait ([place]) 02/13/2012  . Overweight (BMI 25.0-29.9) 02/13/2012  . Gastroesophageal reflux disease without esophagitis 08/13/2011  . Hx of adenomatous colonic polyps 11/16/2008  . Tobacco abuse disorder 06/09/2006  . Perennial allergic rhinitis with seasonal variation 06/09/2006  . Mild persistent chronic asthma without complication 02/54/2706  . Persistent microalbuminuria associated with type II diabetes mellitus ([place]) 05/21/2006  . Osteoarthritis, generalized 05/21/2006    Past Surgical History:  Procedure Laterality Date  . APPENDECTOMY    . COLONOSCOPY  11/16/08  . FOOT SURGERY  07/31/2008   Exostectomy right hallus  . TUBAL LIGATION      OB History    No data available       Home Medications    Prior to Admission medications   Medication Sig Start Date End Date Taking? Authorizing Provider  ACCU-CHEK FASTCLIX LANCETS MISC 1 each by Does not apply route every morning. 10/19/15   Oval Linsey, MD  albuterol (  PROVENTIL) (2.5 MG/3ML) 0.083% nebulizer solution Take 3 mLs (2.5 mg total) by nebulization every 6 (six) hours as needed for shortness of breath. 10/19/15   Oval Linsey, MD  albuterol (VENTOLIN HFA) 108 (90 Base) MCG/ACT inhaler Inhale 1-2 puffs into the lungs every 6 (six) hours as needed for shortness of breath. 10/19/15   Oval Linsey, MD  beclomethasone (QVAR) 40 MCG/ACT inhaler Inhale 2 puffs into the lungs 2 (two) times daily. 10/19/15   Oval Linsey,  MD  Blood Glucose Monitoring Suppl (ACCU-CHEK NANO SMARTVIEW) W/DEVICE KIT 1 each by Does not apply route every morning. 02/25/13   Oval Linsey, MD  canagliflozin [facility]) 100 MG TABS tablet Take 1 tablet (100 mg total) by mouth daily before breakfast. 02/14/16   Oval Linsey, MD  Cyanocobalamin (VITAMIN B 12 PO) Take 1 tablet by mouth daily.    Historical Provider, MD  cyclobenzaprine (FLEXERIL) 5 MG tablet Take 1 tablet (5 mg total) by mouth 3 (three) times daily as needed for muscle spasms. 10/19/15   Oval Linsey, MD  fluticasone (FLONASE) 50 MCG/ACT nasal spray Place 2 sprays into both nostrils daily. 10/19/15   Oval Linsey, MD  gabapentin (NEURONTIN) 300 MG capsule Take 1 capsule (300 mg total) by mouth at bedtime. 02/14/16   Oval Linsey, MD  glipiZIDE (GLUCOTROL XL) 10 MG 24 hr tablet Take 2 tablets (20 mg total) by mouth daily with breakfast. 10/19/15   Oval Linsey, MD  glucose blood (ONE TOUCH ULTRA TEST) test strip USE TO TEST BLOOD GLUCOSE 1-2 TIMES A DAY 10/19/15   Oval Linsey, MD  ibuprofen (ADVIL,MOTRIN) 800 MG tablet Take 1 tablet (800 mg total) by mouth every 8 (eight) hours as needed. 02/14/16   Oval Linsey, MD  ketoconazole (NIZORAL) 2 % cream APPLY TOPICALLY TWICE DAILY 02/14/16   Wallene Huh, DPM  lisinopril (PRINIVIL,ZESTRIL) 20 MG tablet Take 1 tablet (20 mg total) by mouth daily. 10/19/15   Oval Linsey, MD  montelukast (SINGULAIR) 10 MG tablet Take 10 mg by mouth daily. 10/10/15   Historical Provider, MD  PATANOL 0.1 % ophthalmic solution Place 1 drop into both eyes 2 (two) times daily. Reported on 10/19/2015 05/12/14   Historical Provider, MD  simvastatin (ZOCOR) 20 MG tablet Take 1 tablet (20 mg total) by mouth every evening. 10/19/15   Oval Linsey, MD  sitaGLIPtin (JANUVIA) 100 MG tablet Take 1 tablet (100 mg total) by mouth daily. 10/19/15   Oval Linsey, MD  triamcinolone (KENALOG) 0.025 % ointment Apply 1 application topically 2 (two) times daily.  10/19/15   Oval Linsey, MD    Family History Family History  Problem Relation Age of Onset  . Diabetes Mother   . Heart disease Mother 66  . Colon cancer Mother   . Cancer Father   . Sickle cell anemia Sister   . Sickle cell anemia Brother   . Diabetes Sister   . Brain cancer Sister   . Lung cancer Brother   . Healthy Sister   . Healthy Sister   . Healthy Sister   . Healthy Daughter   . Healthy Son   . Healthy Daughter   . Healthy Daughter   . Healthy Son   . Wilson's disease Maternal Uncle   . Esophageal cancer Neg Hx   . Rectal cancer Neg Hx   . Stomach cancer Neg Hx     Social History Social History  Substance Use Topics  . Smoking status: Current Every Day Smoker    Packs/day:  0.50    Years: 25.00    Types: Cigarettes  . Smokeless tobacco: Never Used  . Alcohol use 0.0 oz/week     Comment: occasionally     Allergies   Review of patient's allergies indicates no known allergies.   Review of Systems Review of Systems  Constitutional: Negative for fever.  Musculoskeletal: Positive for arthralgias.   Physical Exam Updated Vital Signs BP 141/86 (BP Location: Right Arm)   Pulse 96   Temp 98.5 F (36.9 C) (Oral)   Resp 16   Ht _0  (1.676 m)   Wt 153 lb (69.4 kg)   LMP 08/27/2007   SpO2 100%   BMI 24.69 kg/m   Physical Exam  Constitutional: She is oriented to person, place, and time. She appears well-developed and well-nourished.  HENT:  Head: Normocephalic.  Eyes: EOM are normal.  Neck: Normal range of motion.  Pulmonary/Chest: Effort normal.  Abdominal: She exhibits no distension.  Musculoskeletal: Normal range of motion. She exhibits tenderness.  Dorsum of the midfoot with faint bruising but no crepitus or deformity. Brisk cap refill. DP pulse is palpable.   Neurological: She is alert and oriented to person, place, and time.  Psychiatric: She has a normal mood and affect.  Nursing note and vitals reviewed.  ED Treatments / Results   Labs (all labs ordered are listed, but only abnormal results are displayed) Labs Reviewed - No data to display  EKG  EKG Interpretation None       Radiology Dg Foot Complete Left  Result Date: 02/22/2016 CLINICAL DATA:  Patient dropped a hammer onto the left foot today. Pain over the second and third metatarsals and ankle. 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 Mild degenerative changes of the first metatarsal-phalangeal joint. No evidence of acute fracture or dislocation. No focal bone lesion or bone destruction. Soft tissues are unremarkable. IMPRESSION: No acute bony abnormalities. Electronically Signed   By: Lucienne Capers M.D.   On: 02/22/2016 23:45    Procedures Procedures (including critical care time)  Medications Ordered in ED Medications - No data to display  DIAGNOSTIC STUDIES:  Oxygen Saturation is 100% on RA, normal by my interpretation.    COORDINATION OF CARE:  10:47 PM Discussed treatment plan with pt at bedside and pt agreed to plan.  Initial Impression / Assessment and Plan / ED Course  I have reviewed the triage vital signs and the nursing notes.  Pertinent labs & imaging results that were available during my care of the patient were reviewed by me and considered in my medical decision making (see chart for details).  Clinical Course    BP 141/86 (BP Location: Right Arm)   Pulse 96   Temp 98.5 F (36.9 C) (Oral)   Resp 16   Ht _1  (1.676 m)   Wt 69.4 kg   LMP 08/27/2007   SpO2 100%   BMI 24.69 kg/m    I personally performed the services described in this documentation, which was scribed in my presence. The recorded information has been reviewed and is accurate.   Final Clinical Impressions(s) / ED Diagnoses   Final diagnoses:  Foot contusion, left, initial encounter    New Prescriptions New Prescriptions   TRAMADOL  (ULTRAM) 50 MG TABLET    Take 1 tablet (50 mg total) by mouth every 6 (six) hours as needed.   11:58 PM  Hammer dropped on her L foot.  Xray negative. Likely a contusion, no break in skin.  RICE therapy discussed.  Tramadol as needed for pain.     Domenic Moras, PA-C 02/22/16 [place], DO 02/23/16 [phone]

## 2016-02-22 NOTE — ED Triage Notes (Signed)
Reports hitting her foot with a hammer tonight, states occurred about 30 minutes ago. CMS intact, able to wiggle toes.

## 2016-02-23 NOTE — ED Notes (Signed)
Pt verbalized understanding of d/c instructions and has no further questions. Pt stable and NAD. Pt d/c home with family driving.  

## 2016-04-21 DIAGNOSIS — H25043 Posterior subcapsular polar age-related cataract, bilateral: Secondary | ICD-10-CM | POA: Diagnosis not present

## 2016-04-21 DIAGNOSIS — H25013 Cortical age-related cataract, bilateral: Secondary | ICD-10-CM | POA: Diagnosis not present

## 2016-04-21 DIAGNOSIS — E119 Type 2 diabetes mellitus without complications: Secondary | ICD-10-CM | POA: Diagnosis not present

## 2016-04-21 DIAGNOSIS — H40023 Open angle with borderline findings, high risk, bilateral: Secondary | ICD-10-CM | POA: Diagnosis not present

## 2016-04-21 DIAGNOSIS — H2513 Age-related nuclear cataract, bilateral: Secondary | ICD-10-CM | POA: Diagnosis not present

## 2016-04-21 DIAGNOSIS — H35033 Hypertensive retinopathy, bilateral: Secondary | ICD-10-CM | POA: Diagnosis not present

## 2016-04-21 DIAGNOSIS — H04123 Dry eye syndrome of bilateral lacrimal glands: Secondary | ICD-10-CM | POA: Diagnosis not present

## 2016-04-21 DIAGNOSIS — H1045 Other chronic allergic conjunctivitis: Secondary | ICD-10-CM | POA: Diagnosis not present

## 2016-04-21 DIAGNOSIS — Z7984 Long term (current) use of oral hypoglycemic drugs: Secondary | ICD-10-CM | POA: Diagnosis not present

## 2016-04-21 LAB — HM DIABETES EYE EXAM

## 2016-05-02 DIAGNOSIS — E119 Type 2 diabetes mellitus without complications: Secondary | ICD-10-CM | POA: Diagnosis not present

## 2016-05-02 DIAGNOSIS — H25813 Combined forms of age-related cataract, bilateral: Secondary | ICD-10-CM | POA: Diagnosis not present

## 2016-05-02 LAB — HM DIABETES EYE EXAM

## 2016-05-06 ENCOUNTER — Encounter: Payer: Self-pay | Admitting: *Deleted

## 2016-05-07 ENCOUNTER — Encounter: Payer: Self-pay | Admitting: Internal Medicine

## 2016-05-07 DIAGNOSIS — H269 Unspecified cataract: Secondary | ICD-10-CM

## 2016-05-07 DIAGNOSIS — H35033 Hypertensive retinopathy, bilateral: Secondary | ICD-10-CM

## 2016-05-07 HISTORY — DX: Unspecified cataract: H26.9

## 2016-05-07 HISTORY — DX: Hypertensive retinopathy, bilateral: H35.033

## 2016-05-30 ENCOUNTER — Ambulatory Visit: Payer: Commercial Managed Care - HMO | Admitting: Internal Medicine

## 2016-05-30 ENCOUNTER — Encounter: Payer: Self-pay | Admitting: *Deleted

## 2016-06-13 ENCOUNTER — Ambulatory Visit: Payer: Commercial Managed Care - HMO | Admitting: Internal Medicine

## 2016-06-19 ENCOUNTER — Telehealth: Payer: Self-pay | Admitting: Internal Medicine

## 2016-06-19 NOTE — Telephone Encounter (Signed)
APT. REMINDER CALL, LMTCB °

## 2016-06-20 ENCOUNTER — Ambulatory Visit (INDEPENDENT_AMBULATORY_CARE_PROVIDER_SITE_OTHER): Payer: Medicare HMO | Admitting: Internal Medicine

## 2016-06-20 ENCOUNTER — Encounter: Payer: Self-pay | Admitting: Internal Medicine

## 2016-06-20 VITALS — BP 140/63 | HR 76 | Temp 98.2°F | Wt 158.6 lb

## 2016-06-20 DIAGNOSIS — E1136 Type 2 diabetes mellitus with diabetic cataract: Secondary | ICD-10-CM | POA: Diagnosis not present

## 2016-06-20 DIAGNOSIS — Z6825 Body mass index (BMI) 25.0-25.9, adult: Secondary | ICD-10-CM

## 2016-06-20 DIAGNOSIS — M159 Polyosteoarthritis, unspecified: Secondary | ICD-10-CM

## 2016-06-20 DIAGNOSIS — E1129 Type 2 diabetes mellitus with other diabetic kidney complication: Secondary | ICD-10-CM

## 2016-06-20 DIAGNOSIS — F17210 Nicotine dependence, cigarettes, uncomplicated: Secondary | ICD-10-CM | POA: Diagnosis not present

## 2016-06-20 DIAGNOSIS — R801 Persistent proteinuria, unspecified: Secondary | ICD-10-CM | POA: Diagnosis not present

## 2016-06-20 DIAGNOSIS — H269 Unspecified cataract: Secondary | ICD-10-CM

## 2016-06-20 DIAGNOSIS — Z7951 Long term (current) use of inhaled steroids: Secondary | ICD-10-CM

## 2016-06-20 DIAGNOSIS — L280 Lichen simplex chronicus: Secondary | ICD-10-CM

## 2016-06-20 DIAGNOSIS — Z0000 Encounter for general adult medical examination without abnormal findings: Secondary | ICD-10-CM

## 2016-06-20 DIAGNOSIS — G5621 Lesion of ulnar nerve, right upper limb: Secondary | ICD-10-CM

## 2016-06-20 DIAGNOSIS — Z7984 Long term (current) use of oral hypoglycemic drugs: Secondary | ICD-10-CM | POA: Diagnosis not present

## 2016-06-20 DIAGNOSIS — K219 Gastro-esophageal reflux disease without esophagitis: Secondary | ICD-10-CM | POA: Diagnosis not present

## 2016-06-20 DIAGNOSIS — E1165 Type 2 diabetes mellitus with hyperglycemia: Secondary | ICD-10-CM

## 2016-06-20 DIAGNOSIS — R809 Proteinuria, unspecified: Principal | ICD-10-CM

## 2016-06-20 DIAGNOSIS — E663 Overweight: Secondary | ICD-10-CM

## 2016-06-20 DIAGNOSIS — J302 Other seasonal allergic rhinitis: Secondary | ICD-10-CM

## 2016-06-20 DIAGNOSIS — J4530 Mild persistent asthma, uncomplicated: Secondary | ICD-10-CM

## 2016-06-20 DIAGNOSIS — J3089 Other allergic rhinitis: Secondary | ICD-10-CM

## 2016-06-20 DIAGNOSIS — Z720 Tobacco use: Secondary | ICD-10-CM

## 2016-06-20 DIAGNOSIS — Z79899 Other long term (current) drug therapy: Secondary | ICD-10-CM

## 2016-06-20 LAB — POCT GLYCOSYLATED HEMOGLOBIN (HGB A1C): HEMOGLOBIN A1C: 9

## 2016-06-20 LAB — GLUCOSE, CAPILLARY: GLUCOSE-CAPILLARY: 134 mg/dL — AB (ref 65–99)

## 2016-06-20 MED ORDER — ACETAMINOPHEN 500 MG PO TABS: 1000.0000 mg | ORAL_TABLET | Freq: Three times a day (TID) | ORAL | 3 refills | Status: DC | PRN

## 2016-06-20 MED ORDER — METFORMIN HCL 500 MG PO TABS: 1000.0000 mg | ORAL_TABLET | Freq: Two times a day (BID) | ORAL | Status: DC

## 2016-06-20 MED ORDER — METFORMIN HCL 1000 MG PO TABS: 1000.0000 mg | ORAL_TABLET | Freq: Two times a day (BID) | ORAL | 3 refills | Status: DC

## 2016-06-20 MED ORDER — GUAIFENESIN ER 600 MG PO TB12: 600.0000 mg | ORAL_TABLET | Freq: Two times a day (BID) | ORAL | 0 refills | Status: DC

## 2016-06-20 NOTE — Assessment & Plan Note (Signed)
There was no time during the visit to perform the Pap smear. She was interested in having this done at the follow-up visit. She will therefore be scheduled for a Pap smear at my next available slot. Otherwise she is up-to-date on her health care maintenance.

## 2016-06-20 NOTE — Assessment & Plan Note (Signed)
Assessment  She continues to have generalized osteoarthritis pain that is not responsive to the ibuprofen. She states she has never tried the acetaminophen before although it was in the Tylenol 3 and we had discussed this in the past as being ineffective. Nonetheless, she was interested in giving acetaminophen another try.  Plan  She was prescribed acetaminophen 1000 mg by mouth every 8 hours as needed for pain. She was advised not to take it more than 3 times daily. We will reassess the efficacy of this therapy for her generalized osteoarthritic pain at the follow-up visit.

## 2016-06-20 NOTE — Assessment & Plan Note (Signed)
Assessment  Her perennial allergic rhinitis with seasonal variation is well-controlled on the Flonase 2 puffs daily.  Plan  We will continue the Flonase at 2 puffs daily and reassess her symptoms at the follow-up visit.

## 2016-06-20 NOTE — Progress Notes (Signed)
   Subjective:    Patient ID: Krista Sosa, female    DOB: [birth year], 66 y.o.   MRN: [id number]  HPI  Krista Sosa is here for follow-up of her diabetes with persistent microalbuminuria, hypertension, hyperlipidemia, asthma, degenerative joint disease, and tobacco use disorder. Please see the A&P for the status of the pt's chronic medical problems.  Review of Systems  Constitutional: Negative for activity change, appetite change and unexpected weight change.  Respiratory: Positive for wheezing. Negative for chest tightness and shortness of breath.        Several weeks ago she had a viral like illness that was consistent with the flu.  At that time she had an increased cough, wheezing, and chest tightness.  Those symptoms have resolved although she feels she still has mucous in the chest that she is unable to mobilize.  Cardiovascular: Negative for chest pain, palpitations and leg swelling.  Gastrointestinal: Negative for abdominal distention, abdominal pain, constipation, diarrhea, nausea and vomiting.  Musculoskeletal: Positive for arthralgias and back pain. Negative for gait problem, joint swelling and myalgias.  Psychiatric/Behavioral: Positive for sleep disturbance.       Difficulty falling asleep.      Objective:   Physical Exam  Constitutional: She is oriented to person, place, and time. She appears well-developed and well-nourished. No distress.  HENT:  Head: Normocephalic and atraumatic.  Eyes: Conjunctivae are normal. Right eye exhibits no discharge. Left eye exhibits no discharge. No scleral icterus.  Cardiovascular: Normal rate, regular rhythm and normal heart sounds.  Exam reveals no gallop and no friction rub.   No murmur heard. Pulmonary/Chest: Effort normal and breath sounds normal. No respiratory distress. She has no wheezes. She has no rales.  Abdominal: Soft. Bowel sounds are normal. She exhibits no distension. There is no tenderness. There is no rebound and no  guarding.  Musculoskeletal: Normal range of motion. She exhibits no edema, tenderness or deformity.  Neurological: She is alert and oriented to person, place, and time. She exhibits normal muscle tone.  Skin: Skin is warm and dry. No rash noted. She is not diaphoretic. No erythema.  Psychiatric: She has a normal mood and affect. Her behavior is normal. Judgment and thought content normal.  Nursing note and vitals reviewed.     Assessment & Plan:   Please see problem based charting.

## 2016-06-20 NOTE — Assessment & Plan Note (Signed)
Assessment  Her lichenoid dermatitis remains well controlled on ttriamcinolone 0.025% ointment twice daily as needed.  Plan  We will continue triamcinolone 0.025% ointment twice daily as needed and reassess the efficacy of this therapy at the follow-up visit

## 2016-06-20 NOTE — Patient Instructions (Signed)
It was good to see you.  1)  Keep taking the medications as you are.  2)  I started guaifenesin 600 mg twice a day for your mucous.  3) I started the acetaminophen 1000 mg every 8 hours as needed for pain.  Stop the ibuprofen.  4) I sent the metformin prescription to [facility].  5) Keep taking the other medications as you are.  Please see the list below because I updated it.  6) Let me know when you are ready to quit smoking.  7) Stop drinking things with a lot of sugar in them.  This may help better control your diabetes.  8) I will schedule you for a Pap Smear.  9) I will send a referral for an eye appointment.

## 2016-06-20 NOTE — Assessment & Plan Note (Signed)
Assessment  Her bilateral cataracts are becoming more visually significant. She is very interested in having them extracted. She was scheduled to have surgery at the surgical center but this was canceled because of a outstanding bill from 12 years ago. She therefore cannot have surgery at that surgical center because of this outstanding bill which she cannot afford to pay at this time.  Plan  We will refer her to an ophthalmologist who does not perform surgery at the surgical center in hopes that they would perform the cataract surgery she requires.

## 2016-06-20 NOTE — Assessment & Plan Note (Signed)
Assessment  The symptoms associated with her ulnar neuropathy of the right upper extremity have resolved on the gabapentin 300 mg by mouth nightly.  Plan  We will continue the gabapentin at 300 mg by mouth nightly for her ulnar neuropathy of the right upper extremity and reassess for symptoms at the follow-up visit on this therapy.

## 2016-06-20 NOTE — Assessment & Plan Note (Signed)
Assessment  Unfortunately her weight is up 5 pounds since the last visit 4 months ago. This is likely contributing to the less than optimal control of her diabetes. We discussed the importance of avoiding sugary drinks.  Plan  She states she will adjust her diet to avoid sugary drinks. We will reassess the efficacy of stopping this therapy on both her diabetes and weight at the follow-up visit.

## 2016-06-20 NOTE — Assessment & Plan Note (Signed)
Assessment  She continues to smoke and remains in the pre-contemplative stage. We spent at least 3 minutes discussing the importance of smoking cessation, particularly given her underlying asthma. She was asked to notify me should she change her mind and want pharmacologic help in quitting.  Plan  We will continue to discuss the importance and the benefits of smoking cessation at each visit.

## 2016-06-20 NOTE — Assessment & Plan Note (Signed)
Assessment  Her diabetes remains poorly controlled with a hemoglobin A1c today of 9.0. That being said, it is improved compared to 4 months ago when it was 11.0. She saw another commercial on TV discussing warnings associated with Canagliflozin, and therefore stopped taking that medication as well. Unfortunately, a similar situation occurred prior to the last clinic visit regarding metformin and she requested that be stopped as well. Thankfully, she decided to go back to metformin. Therefore, her current medications include metformin 1000 mg by mouth twice daily, glipizide XL 20 mg by mouth daily, and Januvia 100 mg by mouth daily. We discussed the importance of getting better control of her diabetes. I mentioned that the next step was likely to begin insulin therapy. She asked that we give a another 3 months before taking that step.  Plan  The importance of limiting carbohydrates and sugars was discussed. I advised her to avoid any sugary drinks, this is in the setting of her drinking nondietary sodas. We will also continue the metformin at 1000 mg by mouth twice daily, glipizide XL 20 mg by mouth daily, and Januvia 100 mg by mouth daily. We will reassess the hemoglobin A1c with the above dietary changes and continuance of the oral regimen. If her hemoglobin A1c is not improved at the follow-up visit we will begin insulin therapy.

## 2016-06-20 NOTE — Assessment & Plan Note (Signed)
Assessment  She states she does not have any symptomatic gastroesophageal reflux disease and is on no H2 blocker at this time.  Plan  We will reassess for recurrence of her symptomatic gastroesophageal reflux disease at the follow-up visit. If present we will restart the ranitidine 300 mg by mouth nightly which was effective at controlling her symptoms previously.

## 2016-06-20 NOTE — Assessment & Plan Note (Signed)
Assessment  Her asthma is symptomatically well controlled on the Qvar 40 g 2 puffs twice daily and albuterol 2 puffs every 6 hours as needed or albuterol nebs every 6 hours as needed. When she had the flu 2 weeks ago she noted an increase in her wheezing and has persistent mucus that she is unable to mobilize. Also of note, she is no longer taking the Singulair.  Plan  We will continue the Qvar 40 g 2 puffs twice daily and albuterol every 6 hours as needed for her asthma. She was given a 2 week supply of guaifenesin 600 mg by mouth twice daily in hopes of loosening her mucus. The importance of smoking cessation in the setting of her asthma was again discussed. We will reassess the control of her asthma at the return visit.

## 2016-07-29 ENCOUNTER — Other Ambulatory Visit: Payer: Self-pay | Admitting: Podiatry

## 2016-07-29 ENCOUNTER — Other Ambulatory Visit: Payer: Self-pay | Admitting: Internal Medicine

## 2016-07-29 DIAGNOSIS — L280 Lichen simplex chronicus: Secondary | ICD-10-CM

## 2016-07-30 DIAGNOSIS — H2513 Age-related nuclear cataract, bilateral: Secondary | ICD-10-CM | POA: Diagnosis not present

## 2016-08-26 ENCOUNTER — Other Ambulatory Visit: Payer: Self-pay | Admitting: Podiatry

## 2016-08-27 NOTE — Telephone Encounter (Signed)
Pt needs an appt prior to future refills. 

## 2016-09-19 ENCOUNTER — Ambulatory Visit: Payer: Medicare HMO | Admitting: Internal Medicine

## 2016-09-23 DIAGNOSIS — Z79899 Other long term (current) drug therapy: Secondary | ICD-10-CM | POA: Diagnosis not present

## 2016-09-23 DIAGNOSIS — E1136 Type 2 diabetes mellitus with diabetic cataract: Secondary | ICD-10-CM | POA: Diagnosis not present

## 2016-09-23 DIAGNOSIS — H2512 Age-related nuclear cataract, left eye: Secondary | ICD-10-CM | POA: Diagnosis not present

## 2016-09-23 DIAGNOSIS — H2513 Age-related nuclear cataract, bilateral: Secondary | ICD-10-CM | POA: Diagnosis not present

## 2016-09-23 DIAGNOSIS — Z7951 Long term (current) use of inhaled steroids: Secondary | ICD-10-CM | POA: Diagnosis not present

## 2016-09-23 DIAGNOSIS — Z7984 Long term (current) use of oral hypoglycemic drugs: Secondary | ICD-10-CM | POA: Diagnosis not present

## 2016-09-23 DIAGNOSIS — F17200 Nicotine dependence, unspecified, uncomplicated: Secondary | ICD-10-CM | POA: Diagnosis not present

## 2016-09-23 DIAGNOSIS — J45909 Unspecified asthma, uncomplicated: Secondary | ICD-10-CM | POA: Diagnosis not present

## 2016-10-02 DIAGNOSIS — H25812 Combined forms of age-related cataract, left eye: Secondary | ICD-10-CM | POA: Diagnosis not present

## 2016-10-02 DIAGNOSIS — J45998 Other asthma: Secondary | ICD-10-CM | POA: Diagnosis not present

## 2016-10-02 DIAGNOSIS — H2511 Age-related nuclear cataract, right eye: Secondary | ICD-10-CM | POA: Diagnosis not present

## 2016-10-16 ENCOUNTER — Encounter: Payer: Self-pay | Admitting: Internal Medicine

## 2016-10-16 DIAGNOSIS — J45998 Other asthma: Secondary | ICD-10-CM | POA: Diagnosis not present

## 2016-10-16 DIAGNOSIS — H25811 Combined forms of age-related cataract, right eye: Secondary | ICD-10-CM | POA: Diagnosis not present

## 2016-10-17 ENCOUNTER — Ambulatory Visit: Payer: Medicare HMO | Admitting: Internal Medicine

## 2016-10-27 ENCOUNTER — Encounter: Payer: Self-pay | Admitting: *Deleted

## 2016-10-30 ENCOUNTER — Other Ambulatory Visit: Payer: Self-pay | Admitting: Internal Medicine

## 2016-10-30 ENCOUNTER — Other Ambulatory Visit: Payer: Self-pay | Admitting: Podiatry

## 2016-10-30 DIAGNOSIS — J4530 Mild persistent asthma, uncomplicated: Secondary | ICD-10-CM

## 2016-10-30 DIAGNOSIS — M159 Polyosteoarthritis, unspecified: Secondary | ICD-10-CM

## 2016-10-30 DIAGNOSIS — R809 Proteinuria, unspecified: Principal | ICD-10-CM

## 2016-10-30 DIAGNOSIS — J302 Other seasonal allergic rhinitis: Secondary | ICD-10-CM

## 2016-10-30 DIAGNOSIS — E1129 Type 2 diabetes mellitus with other diabetic kidney complication: Secondary | ICD-10-CM

## 2016-10-30 DIAGNOSIS — J3089 Other allergic rhinitis: Secondary | ICD-10-CM

## 2016-10-31 ENCOUNTER — Other Ambulatory Visit: Payer: Self-pay | Admitting: Internal Medicine

## 2016-11-05 ENCOUNTER — Telehealth: Payer: Self-pay | Admitting: Internal Medicine

## 2016-11-05 NOTE — Telephone Encounter (Signed)
Call from patient stating for medication refills.  The medication she was requesting were sent to [facility] (mail order) on 10/30/2016.  Pt instructed to give them a call-pt agreed and will contact [facility] if she has any additional problems.  No further action needed at this time, phone call complete.Regenia Skeeter, Darlene Cassady6/13/20189:50 AM

## 2016-11-07 ENCOUNTER — Ambulatory Visit: Payer: Medicare HMO | Admitting: Internal Medicine

## 2016-11-25 ENCOUNTER — Other Ambulatory Visit: Payer: Self-pay | Admitting: Podiatry

## 2017-01-06 ENCOUNTER — Other Ambulatory Visit: Payer: Self-pay | Admitting: Internal Medicine

## 2017-01-06 DIAGNOSIS — Z1231 Encounter for screening mammogram for malignant neoplasm of breast: Secondary | ICD-10-CM

## 2017-01-15 ENCOUNTER — Ambulatory Visit
Admission: RE | Admit: 2017-01-15 | Discharge: 2017-01-15 | Disposition: A | Discharge status: Home / Self Care | Payer: Medicare HMO | Source: Ambulatory Visit | Attending: Internal Medicine | Admitting: Internal Medicine

## 2017-01-15 DIAGNOSIS — Z1231 Encounter for screening mammogram for malignant neoplasm of breast: Secondary | ICD-10-CM | POA: Diagnosis not present

## 2017-01-16 ENCOUNTER — Other Ambulatory Visit: Payer: Self-pay | Admitting: Podiatry

## 2017-01-16 ENCOUNTER — Other Ambulatory Visit: Payer: Self-pay | Admitting: Internal Medicine

## 2017-01-16 DIAGNOSIS — G5621 Lesion of ulnar nerve, right upper limb: Secondary | ICD-10-CM

## 2017-01-19 ENCOUNTER — Other Ambulatory Visit: Payer: Self-pay | Admitting: Internal Medicine

## 2017-01-19 DIAGNOSIS — R928 Other abnormal and inconclusive findings on diagnostic imaging of breast: Secondary | ICD-10-CM

## 2017-01-21 ENCOUNTER — Ambulatory Visit
Admission: RE | Admit: 2017-01-21 | Discharge: 2017-01-21 | Disposition: A | Discharge status: Home / Self Care | Payer: Medicare HMO | Source: Ambulatory Visit | Attending: Internal Medicine | Admitting: Internal Medicine

## 2017-01-21 ENCOUNTER — Ambulatory Visit: Admission: RE | Admit: 2017-01-21 | Payer: Medicare HMO | Source: Ambulatory Visit

## 2017-01-21 DIAGNOSIS — R922 Inconclusive mammogram: Secondary | ICD-10-CM | POA: Diagnosis not present

## 2017-01-21 DIAGNOSIS — R928 Other abnormal and inconclusive findings on diagnostic imaging of breast: Secondary | ICD-10-CM

## 2017-01-29 ENCOUNTER — Encounter: Payer: Self-pay | Admitting: Internal Medicine

## 2017-01-29 ENCOUNTER — Ambulatory Visit (INDEPENDENT_AMBULATORY_CARE_PROVIDER_SITE_OTHER): Payer: Medicare HMO | Admitting: Internal Medicine

## 2017-01-29 VITALS — BP 127/64 | HR 77 | Temp 98.4°F | Wt 155.8 lb

## 2017-01-29 DIAGNOSIS — E663 Overweight: Secondary | ICD-10-CM | POA: Diagnosis not present

## 2017-01-29 DIAGNOSIS — E1129 Type 2 diabetes mellitus with other diabetic kidney complication: Secondary | ICD-10-CM | POA: Diagnosis not present

## 2017-01-29 DIAGNOSIS — F17210 Nicotine dependence, cigarettes, uncomplicated: Secondary | ICD-10-CM

## 2017-01-29 DIAGNOSIS — L280 Lichen simplex chronicus: Secondary | ICD-10-CM | POA: Diagnosis not present

## 2017-01-29 DIAGNOSIS — J4520 Mild intermittent asthma, uncomplicated: Secondary | ICD-10-CM | POA: Diagnosis not present

## 2017-01-29 DIAGNOSIS — Z23 Encounter for immunization: Secondary | ICD-10-CM

## 2017-01-29 DIAGNOSIS — J3089 Other allergic rhinitis: Secondary | ICD-10-CM | POA: Diagnosis not present

## 2017-01-29 DIAGNOSIS — D573 Sickle-cell trait: Secondary | ICD-10-CM | POA: Diagnosis not present

## 2017-01-29 DIAGNOSIS — Z6825 Body mass index (BMI) 25.0-25.9, adult: Secondary | ICD-10-CM

## 2017-01-29 DIAGNOSIS — M159 Polyosteoarthritis, unspecified: Secondary | ICD-10-CM

## 2017-01-29 DIAGNOSIS — R809 Proteinuria, unspecified: Secondary | ICD-10-CM | POA: Diagnosis not present

## 2017-01-29 DIAGNOSIS — Z720 Tobacco use: Secondary | ICD-10-CM

## 2017-01-29 DIAGNOSIS — J302 Other seasonal allergic rhinitis: Secondary | ICD-10-CM

## 2017-01-29 DIAGNOSIS — Z0000 Encounter for general adult medical examination without abnormal findings: Secondary | ICD-10-CM

## 2017-01-29 LAB — POCT GLYCOSYLATED HEMOGLOBIN (HGB A1C): HEMOGLOBIN A1C: 7.4

## 2017-01-29 LAB — GLUCOSE, CAPILLARY: Glucose-Capillary: 170 mg/dL — ABNORMAL HIGH (ref 65–99)

## 2017-01-29 MED ORDER — IBUPROFEN 800 MG PO TABS: 800.0000 mg | ORAL_TABLET | Freq: Three times a day (TID) | ORAL | 0 refills | Status: DC | PRN

## 2017-01-29 MED ORDER — ASPIRIN EC 81 MG PO TBEC: 81.0000 mg | DELAYED_RELEASE_TABLET | Freq: Every day | ORAL | 3 refills | Status: DC

## 2017-01-29 NOTE — Assessment & Plan Note (Signed)
She received the flu shot today. She is not interested in a Pap smear at this time and this will be readdressed at the follow-up visit. She is otherwise up-to-date on both her diabetic and general health maintenance.

## 2017-01-29 NOTE — Patient Instructions (Signed)
It was great to see you again!  It has been a long time.  You are doing a good job with your diabetes and blood pressure!   I am very proud of you.  1) Start taking a baby aspirin (81 mg) daily.  2) Stop the mucinex, flexeril, gabapentin, and ketoconozole.  Keep taking all of the other medications as you are.  3) We gave you the flu shot today.  4) I check the kidney and liver function today.  I will call you early next week with the results.  5) Keep working on weight loss!  6) Change back your soap as this new one is likely causing the itching.  7) Follow-up with the eye doctor for the laser therapy.  This should help protect your eyesight.  8) I referred you to the foot doctor for the left little toe callous.  9) Let me know if you change your mind and are ready to quit smoking.  I will see you back in 3 months, sooner if necessary.

## 2017-01-29 NOTE — Assessment & Plan Note (Signed)
Assessment  Her right upper extremity ulnar neuropathy has resolved. She stopped taking the gabapentin and has had no return of her previous symptoms of numbness. Therefore, I am currently dubious of the diagnosis of a right upper extremity ulnar neuropathy as she is asymptomatic on no medications.  Plan  The gabapentin has been discontinued. I will remove the ulnar neuropathy at the right upper extremity from her problem list.

## 2017-01-29 NOTE — Assessment & Plan Note (Signed)
Assessment  She is continue to do well with weight loss. She has lost an additional 3 pounds over the last 7 months and was praised for this success. Her BMI is now between 25 and 26 and is very close to our target of less than 25. I have little doubt this has contributed to the improved control of her diabetes.  Plan  She was encouraged to continue with her dietary and lifestyle modifications with the hopes of further weight loss and improvement in both her diabetic control and achieving the target weight with a BMI less than 25.

## 2017-01-29 NOTE — Progress Notes (Signed)
   Subjective:    Patient ID: Krista Sosa, female    DOB: February 01, 1951, 66 y.o.   MRN: [id number]  HPI  Krista Sosa is here for follow-up of her diabetes with persistent microalbuminuria, mild intermittent asthma, tobacco abuse, generalized osteoarthritis, overweight status, and sickle cell trait. Please see the A&P for the status of the pt's chronic medical problems.  ROS : per ROS section and in problem oriented charting. All other systems are negative.  Her only new complaint today is that she's had generalized itching without a rash for about one month. When I asked her if she had changed any soaps she said that she had at the time the itching and started. As she thought about it she feels this is the most likely cause of her itching. She denies any decreased appetite or jaundice. She is without other acute complaints.  Review of Systems  Constitutional: Negative for activity change, appetite change and unexpected weight change.  HENT: Negative for congestion, sinus pain, sinus pressure and sneezing.   Eyes: Positive for visual disturbance. Negative for photophobia, pain, discharge, redness and itching.       She feels her vision has not improved with the cataract surgery bilaterally.  She states she is scheduled for laser surgery in the near future.  Respiratory: Negative for cough, chest tightness, shortness of breath and wheezing.   Cardiovascular: Negative for chest pain, palpitations and leg swelling.  Musculoskeletal: Positive for arthralgias. Negative for gait problem, joint swelling and myalgias.       Her feet have been problematic lately and hurt to the point she has to stop after about 200 feet.  At rest they are not painful.  Her arthritis pain has responded well to ibuprofen PRN.  Skin: Negative for color change and rash.  Allergic/Immunologic: Positive for environmental allergies.       May have itching secondary to a change in soaps  Neurological: Negative for  dizziness, syncope, weakness and numbness.      Objective:   Physical Exam  Constitutional: She is oriented to person, place, and time. She appears well-developed and well-nourished. No distress.  HENT:  Head: Normocephalic and atraumatic.  Eyes: Conjunctivae are normal. Right eye exhibits no discharge. Left eye exhibits no discharge. No scleral icterus.  Pulmonary/Chest: Effort normal and breath sounds normal. No respiratory distress. She has no wheezes. She has no rales.  Musculoskeletal: Normal range of motion. She exhibits no edema, tenderness or deformity.  Neurological: She is alert and oriented to person, place, and time. She exhibits normal muscle tone.  Skin: Skin is warm and dry. No rash noted. She is not diaphoretic. No erythema.  Psychiatric: She has a normal mood and affect. Her behavior is normal. Judgment and thought content normal.  Nursing note and vitals reviewed.     Assessment & Plan:   Please see problem oriented charting.

## 2017-01-29 NOTE — Assessment & Plan Note (Signed)
Assessment  She states she has not been using the Flonase recently. She actually asked me to remove it from her medication list as she has not had symptoms requiring its use.  Plan  The Flonase has been removed from her medication list. I asked her to call should she have recurrent symptoms and require me to rewrite the prescription.

## 2017-01-29 NOTE — Assessment & Plan Note (Signed)
Assessment  Although she carries a diagnosis of sickle cell trait she continues to not have any difficulty with this not unsurprisingly.  Plan  We will continue to monitor periodically for symptoms suggesting symptomatic sickle cell.

## 2017-01-29 NOTE — Assessment & Plan Note (Signed)
Assessment  She continues to smoke and is not mentally ready to quit at this time. We again spent 3 minutes discussing the importance of smoking cessation given her diabetes and her asthma.  Plan  She was asked to contact me should she change her mind's eye could provide pharmacologic help to achieve tobacco cessation.

## 2017-01-29 NOTE — Assessment & Plan Note (Addendum)
Assessment  Krista Sosa has marked improvement in her diabetes over the last half a year. Her hemoglobin A1c today is 7.4. This is improved from 9.0 at the last visit. Her current medications include Glucotrol XL 20 mg by mouth daily, metformin 1000 mg by mouth twice daily, and Januvia 100 mg by mouth daily. She is tolerating these medications well. She is up-to-date on all of her diabetic screening, and a repeat urinalysis for microalbuminuria was obtained today and is pending at the time of this dictation. She tells me she is scheduled for laser therapy in the eye. I am assuming this is for a diabetic proliferative retinopathy although I do not currently have records confirming this.  Plan  We will continue the Glucotrol XL 20 mg by mouth daily, metformin 1000 mg by mouth twice daily, and Januvia 100 mg by mouth daily. We will try to obtain the ophthalmology records to get the specifics as to her current retinal disease requiring laser therapy. She was praised on her marked improvement in control and advised to continue with the lifestyle modifications that she has undertaken. This includes further weight loss as she is very near being less than a BMI of 25 which is our target. Given the diabetes, it was recommended that she start aspirin 81 mg by mouth daily. For her microalbuminuria, pending the results of today's exam, we well continue the lisinopril at 20 mg by mouth daily. We will repeat the hemoglobin A1c in 3 months. She was also referred to podiatry to address the left fifth toe lateral callus.

## 2017-01-29 NOTE — Assessment & Plan Note (Signed)
Assessment  Her generalized osteoarthritis is well-controlled with the initiation of ibuprofen 800 mg by mouth every 8 hours as needed. She is tolerating this medication well without side effects.  Plan  We will continue the ibuprofen 800 mg by mouth every 8 hours. The prescription was sent to her preferred pharmacy. We will assess the efficacy of this therapy at the follow-up visit.

## 2017-01-29 NOTE — Assessment & Plan Note (Signed)
Assessment  She has had excellent control of her asthma and has stopped taking the Qvar. She states she rarely needs the as needed albuterol, and when she does it is effective in managing her symptoms.  Plan  We will continue the as needed albuterol and reassess the use as well as the control of her asthma at the follow-up visit. I have removed Qvar from her medication list since she is not taking it. I am changing the diagnosis from mild persistent chronic asthma to mild intermittent chronic asthma.

## 2017-01-29 NOTE — Assessment & Plan Note (Signed)
Assessment  Her lichenoid dermatitis is well controlled on triamcinolone 0.025% ointment twice daily as needed.  Plan  We will continue the triamcinolone 0.025% ointment twice daily as needed and reassess the efficacy of this therapy at the follow-up visit.

## 2017-01-30 ENCOUNTER — Encounter: Payer: Medicare HMO | Admitting: Internal Medicine

## 2017-01-30 LAB — CMP14 + ANION GAP
A/G RATIO: 1.4 (ref 1.2–2.2)
ALT: 13 IU/L (ref 0–32)
ANION GAP: 13 mmol/L (ref 10.0–18.0)
AST: 11 IU/L (ref 0–40)
Albumin: 4.2 g/dL (ref 3.6–4.8)
Alkaline Phosphatase: 49 IU/L (ref 39–117)
BUN/Creatinine Ratio: 23 (ref 12–28)
BUN: 11 mg/dL (ref 8–27)
Bilirubin Total: 0.2 mg/dL (ref 0.0–1.2)
CALCIUM: 9.7 mg/dL (ref 8.7–10.3)
CO2: 22 mmol/L (ref 20–29)
CREATININE: 0.47 mg/dL — AB (ref 0.57–1.00)
Chloride: 105 mmol/L (ref 96–106)
GFR, EST AFRICAN AMERICAN: 121 mL/min/{1.73_m2} (ref >59)
GFR, EST NON AFRICAN AMERICAN: 105 mL/min/{1.73_m2} (ref >59)
Globulin, Total: 3.1 g/dL (ref 1.5–4.5)
Glucose: 113 mg/dL — ABNORMAL HIGH (ref 65–99)
POTASSIUM: 4.3 mmol/L (ref 3.5–5.2)
Sodium: 140 mmol/L (ref 134–144)
TOTAL PROTEIN: 7.3 g/dL (ref 6.0–8.5)

## 2017-01-30 LAB — MICROALBUMIN / CREATININE URINE RATIO
Creatinine, Urine: 44.3 mg/dL
MICROALB/CREAT RATIO: 104.1 mg/g{creat} — AB (ref 0.0–30.0)
MICROALBUM., U, RANDOM: 46.1 ug/mL

## 2017-02-03 NOTE — Progress Notes (Signed)
LFTs: Unremarkable  BMP K 4.3, Cr 0.47, eGFR 121  Renal function has remained stable on current regimen.  Urine creatinine 44.3 Urine albumin 46.1 Microalbumin/Creatinine 104.1  Ratio remains elevated but improved since starting lisinopril.  Will continue the lisinopril at this time and consider increasing the dose to 40 mg daily at the follow-up visit if the blood pressure would require it or tolerate it.  Krista Sosa was called with the results.

## 2017-02-20 ENCOUNTER — Other Ambulatory Visit: Payer: Self-pay | Admitting: Podiatry

## 2017-02-26 ENCOUNTER — Encounter: Payer: Self-pay | Admitting: Podiatry

## 2017-02-26 ENCOUNTER — Ambulatory Visit (INDEPENDENT_AMBULATORY_CARE_PROVIDER_SITE_OTHER): Payer: Medicare HMO | Admitting: Podiatry

## 2017-02-26 ENCOUNTER — Ambulatory Visit (INDEPENDENT_AMBULATORY_CARE_PROVIDER_SITE_OTHER): Payer: Medicare HMO

## 2017-02-26 DIAGNOSIS — M2042 Other hammer toe(s) (acquired), left foot: Secondary | ICD-10-CM | POA: Diagnosis not present

## 2017-02-26 NOTE — Patient Instructions (Signed)
Pre-Operative Instructions  Congratulations, you have decided to take an important step towards improving your quality of life.  You can be assured that the doctors and staff at [facility] will be with you every step of the way.  Here are some important things you should know:  1. Plan to be at the surgery center/hospital at least 1 (one) hour prior to your scheduled time, unless otherwise directed by the surgical center/hospital staff.  You must have a responsible adult accompany you, remain during the surgery and drive you home.  Make sure you have directions to the surgical center/hospital to ensure you arrive on time. 2. If you are having surgery at [facility] or [facility], you will need a copy of your medical history and physical form from your family physician within one month prior to the date of surgery. We will give you a form for your primary physician to complete.  3. We make every effort to accommodate the date you request for surgery.  However, there are times where surgery dates or times have to be moved.  We will contact you as soon as possible if a change in schedule is required.   4. No aspirin/ibuprofen for one week before surgery.  If you are on aspirin, any non-steroidal anti-inflammatory medications (Mobic, Aleve, Ibuprofen) should not be taken seven (7) days prior to your surgery.  You make take Tylenol for pain prior to surgery.  5. Medications - If you are taking daily heart and blood pressure medications, seizure, reflux, allergy, asthma, anxiety, pain or diabetes medications, make sure you notify the surgery center/hospital before the day of surgery so they can tell you which medications you should take or avoid the day of surgery. 6. No food or drink after midnight the night before surgery unless directed otherwise by surgical center/hospital staff. 7. No alcoholic beverages 24-hours prior to surgery.  No smoking 24-hours prior or 24-hours after  surgery. 8. Wear loose pants or shorts. They should be loose enough to fit over bandages, boots, and casts. 9. Don't wear slip-on shoes. Sneakers are preferred. 10. Bring your boot with you to the surgery center/hospital.  Also bring crutches or a walker if your physician has prescribed it for you.  If you do not have this equipment, it will be provided for you after surgery. 11. If you have not been contacted by the surgery center/hospital by the day before your surgery, call to confirm the date and time of your surgery. 12. Leave-time from work may vary depending on the type of surgery you have.  Appropriate arrangements should be made prior to surgery with your employer. 13. Prescriptions will be provided immediately following surgery by your doctor.  Fill these as soon as possible after surgery and take the medication as directed. Pain medications will not be refilled on weekends and must be approved by the doctor. 14. Remove nail polish on the operative foot and avoid getting pedicures prior to surgery. 15. Wash the night before surgery.  The night before surgery wash the foot and leg well with water and the antibacterial soap provided. Be sure to pay special attention to beneath the toenails and in between the toes.  Wash for at least three (3) minutes. Rinse thoroughly with water and dry well with a towel.  Perform this wash unless told not to do so by your physician.  Enclosed: 1 Ice pack (please put in freezer the night before surgery)   1 Hibiclens skin cleaner     Pre-op instructions  If you have any questions regarding the instructions, please do not hesitate to call our office.  [place]: [address], [place], [place] [address] -- [phone]  [place]: [address] [place], [place] [address] -- [phone]  [place]: [address]  [place], [place] [address] -- [phone]  [place]: [address] [place] [address], [address], [place], [place] [address] -- [phone]  Website:  [email or web address] 

## 2017-03-02 NOTE — Progress Notes (Addendum)
Subjective:  Patient ID: Krista Sosa, female    DOB: 1951-05-21,  MRN: [id number]  CC: L 5th toe pain.   66 y.o. female presents with the above complaint. Reports pain to left fifth toe. Reports painful callus left fifth toe. Has tried paring the lesion herself. Reports diabetes that is well-controlled. Reports lesion is so painful it hurts her ability to walk. Has been present for years.  Past Medical History:  Diagnosis Date  . Allergy   . Asthma, chronic 06/09/2006  . Bilateral cataracts 05/07/2016   s/p left cataract extraction 10/02/2016 and right cataract extraction 10/16/2016  . Cataract    bil eyes  . Gastroesophageal reflux disease 08/13/2011  . Glaucoma    Per patient report  . Hx of adenomatous colonic polyps 11/16/2008  . Hypertension    pt said she is supposed to take med but she is not taking it.  . Hypertensive retinopathy, grade 2, bilateral 05/07/2016  . Latent tuberculosis    dx 03/17/08 - tx with INH X 9 months  . Lichenoid dermatitis 02/13/2012   Previously treated with Triamcinolone 0.025% cream Q8-12H PRN   . Osteoarthritis, generalized 05/21/2006   Diffuse: cervical spine, right shoulder, left wrist Ineffective medications: Tylenol, ASA, Naprosyn, Ibuprofen, Meloxicam    . Overweight (BMI 25.0-29.9) 02/13/2012  . Perennial allergic rhinitis with seasonal variation 06/09/2006  . Persistent microalbuminuria associated with type II diabetes mellitus ([place]) 05/21/2006  . Sickle cell anemia ([facility])    pt has sickle cell trait per pt  . Sickle cell trait ([place]) 02/13/2012  . Tobacco abuse disorder 06/09/2006  . Tubular adenoma of rectum 11/16/2008   5 mm, excised endoscopically 11/16/2008, repeat colonoscopy due July 2015   . Ulnar neuropathy of right upper extremity 02/14/2016  . Urinary tract disease    JICP   Past Surgical History:  Procedure Laterality Date  . APPENDECTOMY    . COLONOSCOPY  11/16/08  . FOOT SURGERY  07/31/2008   Exostectomy right hallus  .  TUBAL LIGATION      Current Outpatient Prescriptions:  .  ACCU-CHEK FASTCLIX LANCETS MISC, 1 each by Does not apply route every morning., Disp: 102 each, Rfl: 11 .  albuterol (PROVENTIL) (2.5 MG/3ML) 0.083% nebulizer solution, Take 3 mLs (2.5 mg total) by nebulization every 6 (six) hours as needed for shortness of breath., Disp: 75 vial, Rfl: 11 .  albuterol (VENTOLIN HFA) 108 (90 Base) MCG/ACT inhaler, Inhale 1-2 puffs into the lungs every 6 (six) hours as needed for wheezing or shortness of breath., Disp: 18 g, Rfl: 11 .  aspirin EC 81 MG tablet, Take 1 tablet (81 mg total) by mouth daily., Disp: 90 tablet, Rfl: 3 .  Blood Glucose Monitoring Suppl (ACCU-CHEK NANO SMARTVIEW) W/DEVICE KIT, 1 each by Does not apply route every morning., Disp: 1 kit, Rfl: 0 .  Cyanocobalamin (VITAMIN B 12 PO), Take 1 tablet by mouth daily., Disp: , Rfl:  .  glipiZIDE (GLUCOTROL XL) 10 MG 24 hr tablet, Take 2 tablets (20 mg total) by mouth daily with breakfast., Disp: 180 tablet, Rfl: 3 .  glucose blood (ONE TOUCH ULTRA TEST) test strip, USE TO TEST BLOOD GLUCOSE 1-2 TIMES A DAY, Disp: 100 each, Rfl: 11 .  ibuprofen (ADVIL,MOTRIN) 800 MG tablet, Take 1 tablet (800 mg total) by mouth every 8 (eight) hours as needed., Disp: 270 tablet, Rfl: 0 .  lisinopril (PRINIVIL,ZESTRIL) 20 MG tablet, Take 1 tablet (20 mg total) by mouth daily., Disp: 90 tablet, Rfl:  3 .  metFORMIN (GLUCOPHAGE) 1000 MG tablet, Take 1 tablet (1,000 mg total) by mouth 2 (two) times daily with a meal., Disp: 180 tablet, Rfl: 3 .  simvastatin (ZOCOR) 20 MG tablet, Take 1 tablet (20 mg total) by mouth every evening., Disp: 90 tablet, Rfl: 3 .  sitaGLIPtin (JANUVIA) 100 MG tablet, Take 1 tablet (100 mg total) by mouth daily., Disp: 90 tablet, Rfl: 3 .  triamcinolone (KENALOG) 0.025 % ointment, APPLY TOPICALLY TWICE DAILY AS DIRECTED, Disp: 30 g, Rfl: 4  No Known Allergies Review of Systems   Objective:  There were no vitals filed for this  visit. General AA&O x3. Normal mood and affect.  Vascular Dorsalis pedis and posterior tibial pulses  present 2+ bilaterally  Capillary refill normal to all digits. Pedal hair growth normal.  Neurologic Epicritic sensation grossly present.  Dermatologic No open lesions. Interspaces clear of maceration. Nails well groomed and normal in appearance.  Orthopedic: MMT 5/5 in dorsiflexion, plantarflexion, inversion, and eversion. Normal joint ROM without pain or crepitus. L 5th adductovarus digit with pain to palpation and associated soft corn.   Radiographs: Three views of a skeletally mature L foot. No fractures or dislocations. Lesser digital contractures. Adductovarus left fifth toe deformity. Lateral bowing of the 5th metatarsal noted. Prior resection L distal phalanx of the hallux noted.  Assessment & Plan:  Patient was evaluated and treated and all questions answered.  L 5th Toe Hammertoe, Adductovarus -XR reviewed as above. See above report. -Patient has failed all conservative therapy and wishes to proceed with surgical intervention. -Discussed surgical correction of the painful 5th digit hammertoe. All r/b/a discussed with patient. -Post-op course discussed at length. Patient wishes to proceed.  25 minutes of face to face time were spent with the patient. >50% of this was spent on counseling and coordination of care. Specifically discussed with patient surgical plan postop course. Patient wishes to proceed with above surgery.   F/u after surgery

## 2017-03-04 DIAGNOSIS — M2042 Other hammer toe(s) (acquired), left foot: Secondary | ICD-10-CM | POA: Diagnosis not present

## 2017-03-04 DIAGNOSIS — J45909 Unspecified asthma, uncomplicated: Secondary | ICD-10-CM | POA: Diagnosis not present

## 2017-03-05 ENCOUNTER — Other Ambulatory Visit: Payer: Self-pay | Admitting: Podiatry

## 2017-03-11 ENCOUNTER — Encounter: Payer: Self-pay | Admitting: Podiatry

## 2017-03-11 ENCOUNTER — Ambulatory Visit (INDEPENDENT_AMBULATORY_CARE_PROVIDER_SITE_OTHER): Payer: Medicare HMO

## 2017-03-11 ENCOUNTER — Ambulatory Visit (INDEPENDENT_AMBULATORY_CARE_PROVIDER_SITE_OTHER): Payer: Medicare HMO | Admitting: Podiatry

## 2017-03-11 VITALS — BP 124/73 | HR 77 | Resp 18

## 2017-03-11 DIAGNOSIS — M2042 Other hammer toe(s) (acquired), left foot: Secondary | ICD-10-CM

## 2017-03-11 DIAGNOSIS — Z9889 Other specified postprocedural states: Secondary | ICD-10-CM

## 2017-03-11 MED ORDER — OXYCODONE-ACETAMINOPHEN 5-325 MG PO TABS: 1.0000 | ORAL_TABLET | ORAL | 0 refills | Status: DC | PRN

## 2017-03-11 NOTE — Progress Notes (Signed)
  Subjective:  Patient ID: Krista Sosa, female    DOB: April 06, 1951,  MRN: [id number]  Chief Complaint  Patient presents with  . Routine Post Op    surgery on 03/04/17 on my left 5th toe    DOS: 03/04/17 PROCEDURE: L 5th Toe Arthroplasty  66 y.o. female returns for the above complaint. Pain controlled with Percocet. No N/V/F/Ch. No issues today.  Objective:   Vitals:   03/11/17 0912  BP: 124/73  Pulse: 77  Resp: 18   General AA&O x3. Normal mood and affect.  Vascular Foot warm and well perfused.  Neurologic Gross sensation intact.  Dermatologic Skin healing well without signs of infection. Sutures intact.  Orthopedic: Tenderness to palpation noted about the surgical site.   Radiographs: Taken and reviewed. Interval resection of the proximal phalanx noted L 5th toe. Assessment & Plan:  Patient was evaluated and treated and all questions answered.  S/p L 5th Toe Arthroplasty -Surgical incision healing well.  -Percocet refilled. -F/u in 1 week for wound check, suture removal.

## 2017-03-13 DIAGNOSIS — H26492 Other secondary cataract, left eye: Secondary | ICD-10-CM | POA: Diagnosis not present

## 2017-03-18 ENCOUNTER — Ambulatory Visit (INDEPENDENT_AMBULATORY_CARE_PROVIDER_SITE_OTHER): Payer: Medicare HMO | Admitting: Podiatry

## 2017-03-18 DIAGNOSIS — Z9889 Other specified postprocedural states: Secondary | ICD-10-CM

## 2017-03-18 NOTE — Progress Notes (Signed)
  Subjective:  Patient ID: Krista Sosa, female    DOB: Sep 18, 1950,  MRN: [id number]  Chief Complaint  Patient presents with  . Routine Post Op    pov #2-  suture removal   DOS: 03/04/17 PROCEDURE: L 5th Toe Arthroplasty  66 y.o. female returns for the above complaint.  Reporting minimal pain.  Denies nausea vomiting fever chills.  No complaints.  Presents wearing her surgical shoe  Objective:   There were no vitals filed for this visit. General AA&O x3. Normal mood and affect.  Vascular Foot warm and well perfused.  Neurologic Gross sensation intact.  Dermatologic Skin well-healed with intact suture material  Orthopedic: Mild tenderness to palpation noted about the surgical site.   Assessment & Plan:  Patient was evaluated and treated and all questions answered.  S/p L 5th Toe Arthroplasty -Sutures removed. Steri strips applied . Advised it is okay to shower but not soak. -Continue surgical shoe for another week. Patient to then transition to regular shoe -Follow-up in 2 weeks

## 2017-04-01 ENCOUNTER — Encounter: Payer: Medicare HMO | Admitting: Podiatry

## 2017-04-08 ENCOUNTER — Encounter: Payer: Medicare HMO | Admitting: Podiatry

## 2017-04-15 ENCOUNTER — Telehealth: Payer: Self-pay | Admitting: Podiatry

## 2017-04-15 NOTE — Telephone Encounter (Signed)
This is Patent attorney. We are calling concerning the prescription we have been faxing over for pt's Tramadol 50 mg tablet. If you could please fax that back or call it in so we can get this to the pt as quickly as possible. Our phone number is [phone] and our fax number is [phone]. Thanks and have a great day.

## 2017-04-22 ENCOUNTER — Ambulatory Visit (INDEPENDENT_AMBULATORY_CARE_PROVIDER_SITE_OTHER): Payer: Medicare HMO

## 2017-04-22 ENCOUNTER — Ambulatory Visit (INDEPENDENT_AMBULATORY_CARE_PROVIDER_SITE_OTHER): Payer: Medicare HMO | Admitting: Podiatry

## 2017-04-22 DIAGNOSIS — M2042 Other hammer toe(s) (acquired), left foot: Secondary | ICD-10-CM | POA: Diagnosis not present

## 2017-04-22 DIAGNOSIS — Z9889 Other specified postprocedural states: Secondary | ICD-10-CM

## 2017-04-23 NOTE — Progress Notes (Signed)
  Subjective:  Patient ID: Krista Sosa, female    DOB: 1950-12-28,  MRN: [id number]  No chief complaint on file.   DOS: 03/04/2017 Procedure: Left fifth toe arthroplasty  66 y.o. female returns for post-op check.   Patient is doing well.  Ambulating in normal shoe gear without pain.  States the area is doing well.  Objective:   General AA&O x3. Normal mood and affect.  Vascular Foot warm and well perfused.  Neurologic Gross sensation intact.  Dermatologic Skin well-healed with overlying scab  Orthopedic:  No tenderness to palpation noted about the surgical site.   Assessment & Plan:  Patient was evaluated and treated and all questions answered.  S/p left fifth toe arthroplasty -Doing well without issue.  Inability normal shoe gear.  Pleased with results.   -Small scab over incision noted advised to let it fall off on its own  Return in about 2 months (around 06/22/2017).

## 2017-05-08 ENCOUNTER — Ambulatory Visit: Payer: Medicare HMO | Admitting: Internal Medicine

## 2017-05-29 ENCOUNTER — Other Ambulatory Visit: Payer: Self-pay | Admitting: Internal Medicine

## 2017-05-29 DIAGNOSIS — R809 Proteinuria, unspecified: Principal | ICD-10-CM

## 2017-05-29 DIAGNOSIS — L280 Lichen simplex chronicus: Secondary | ICD-10-CM

## 2017-05-29 DIAGNOSIS — E1129 Type 2 diabetes mellitus with other diabetic kidney complication: Secondary | ICD-10-CM

## 2017-06-05 ENCOUNTER — Ambulatory Visit (INDEPENDENT_AMBULATORY_CARE_PROVIDER_SITE_OTHER): Payer: Medicare HMO | Admitting: Internal Medicine

## 2017-06-05 ENCOUNTER — Encounter: Payer: Self-pay | Admitting: Internal Medicine

## 2017-06-05 VITALS — BP 122/64 | HR 82 | Temp 98.2°F | Wt 158.7 lb

## 2017-06-05 DIAGNOSIS — G4700 Insomnia, unspecified: Secondary | ICD-10-CM | POA: Diagnosis not present

## 2017-06-05 DIAGNOSIS — M159 Polyosteoarthritis, unspecified: Secondary | ICD-10-CM | POA: Diagnosis not present

## 2017-06-05 DIAGNOSIS — F17210 Nicotine dependence, cigarettes, uncomplicated: Secondary | ICD-10-CM | POA: Diagnosis not present

## 2017-06-05 DIAGNOSIS — E1169 Type 2 diabetes mellitus with other specified complication: Secondary | ICD-10-CM

## 2017-06-05 DIAGNOSIS — J4520 Mild intermittent asthma, uncomplicated: Secondary | ICD-10-CM

## 2017-06-05 DIAGNOSIS — F4329 Adjustment disorder with other symptoms: Secondary | ICD-10-CM

## 2017-06-05 DIAGNOSIS — L280 Lichen simplex chronicus: Secondary | ICD-10-CM | POA: Diagnosis not present

## 2017-06-05 DIAGNOSIS — Z720 Tobacco use: Secondary | ICD-10-CM

## 2017-06-05 DIAGNOSIS — Z7984 Long term (current) use of oral hypoglycemic drugs: Secondary | ICD-10-CM

## 2017-06-05 DIAGNOSIS — F3289 Other specified depressive episodes: Secondary | ICD-10-CM | POA: Diagnosis not present

## 2017-06-05 DIAGNOSIS — R809 Proteinuria, unspecified: Principal | ICD-10-CM

## 2017-06-05 DIAGNOSIS — R801 Persistent proteinuria, unspecified: Secondary | ICD-10-CM

## 2017-06-05 DIAGNOSIS — J302 Other seasonal allergic rhinitis: Secondary | ICD-10-CM

## 2017-06-05 DIAGNOSIS — F329 Major depressive disorder, single episode, unspecified: Secondary | ICD-10-CM

## 2017-06-05 DIAGNOSIS — E1129 Type 2 diabetes mellitus with other diabetic kidney complication: Secondary | ICD-10-CM

## 2017-06-05 DIAGNOSIS — Z79899 Other long term (current) drug therapy: Secondary | ICD-10-CM

## 2017-06-05 DIAGNOSIS — F330 Major depressive disorder, recurrent, mild: Secondary | ICD-10-CM | POA: Insufficient documentation

## 2017-06-05 DIAGNOSIS — Z791 Long term (current) use of non-steroidal anti-inflammatories (NSAID): Secondary | ICD-10-CM

## 2017-06-05 DIAGNOSIS — H538 Other visual disturbances: Secondary | ICD-10-CM | POA: Diagnosis not present

## 2017-06-05 DIAGNOSIS — J3089 Other allergic rhinitis: Secondary | ICD-10-CM

## 2017-06-05 LAB — GLUCOSE, CAPILLARY: GLUCOSE-CAPILLARY: 148 mg/dL — AB (ref 65–99)

## 2017-06-05 LAB — POCT GLYCOSYLATED HEMOGLOBIN (HGB A1C): Hemoglobin A1C: 8.5

## 2017-06-05 MED ORDER — SERTRALINE HCL 50 MG PO TABS: 50.0000 mg | ORAL_TABLET | Freq: Every day | ORAL | 3 refills | Status: DC

## 2017-06-05 MED ORDER — TRAZODONE HCL 100 MG PO TABS: 100.0000 mg | ORAL_TABLET | Freq: Every evening | ORAL | 3 refills | Status: DC | PRN

## 2017-06-05 NOTE — Progress Notes (Signed)
   Subjective:    Patient ID: Krista Sosa, female    DOB: [birth year], 67 y.o.   MRN: [id number]  HPI  Krista Sosa is here for follow-up of her type 2 diabetes with persistent microalbuminuria, mild intermittent asthma, generalized osteoarthritis, lichenoid dermatitis, and tobacco abuse. Please see the A&P for the status of the pt's chronic medical problems.  Her only new complaint today was insomnia. She states she has difficulty falling asleep but once asleep was able to maintain sleep. When I asked her why she may have difficulty falling asleep she was unsure. As I pressed further she did admit to some depression and associated anxiety. She was interested in treatment for her depression as well as receiving a sleep aid. She was without other acute complaints.  Review of Systems  Constitutional: Negative for activity change, appetite change and unexpected weight change.  Eyes: Positive for visual disturbance.       Vision was not improved post-operatively  Respiratory: Negative for cough, chest tightness, shortness of breath and wheezing.   Cardiovascular: Negative for chest pain, palpitations and leg swelling.  Gastrointestinal: Negative for abdominal pain, constipation, diarrhea, nausea and vomiting.  Genitourinary: Negative for difficulty urinating.  Musculoskeletal: Positive for arthralgias. Negative for joint swelling and myalgias.  Skin:       Dry skin  Psychiatric/Behavioral: Positive for dysphoric mood and sleep disturbance. The patient is nervous/anxious.       Objective:   Physical Exam  Constitutional: She is oriented to person, place, and time. She appears well-developed and well-nourished. No distress.  HENT:  Head: Normocephalic and atraumatic.  Eyes: Conjunctivae are normal. Right eye exhibits no discharge. Left eye exhibits no discharge. No scleral icterus.  Cardiovascular: Normal rate, regular rhythm and normal heart sounds. Exam reveals no gallop and no  friction rub.  No murmur heard. Pulmonary/Chest: Effort normal and breath sounds normal. No respiratory distress. She has no wheezes. She has no rales.  Abdominal: Soft. Bowel sounds are normal. She exhibits no distension. There is no tenderness. There is no rebound and no guarding.  Musculoskeletal: Normal range of motion. She exhibits no edema, tenderness or deformity.  Neurological: She is alert and oriented to person, place, and time. She exhibits normal muscle tone.  Skin: Skin is warm and dry. No rash noted. She is not diaphoretic. No erythema.  Psychiatric: She has a normal mood and affect. Her behavior is normal. Judgment and thought content normal.  Nursing note and vitals reviewed.     Assessment & Plan:   Please see problem oriented charting.

## 2017-06-05 NOTE — Progress Notes (Signed)
85

## 2017-06-05 NOTE — Assessment & Plan Note (Signed)
Assessment  Her generalized osteoarthritis pain has been well controlled on the as needed ibuprofen. She is tolerating this medicine well.  Plan  We will continue the ibuprofen 800 mg by mouth every 8 hours as needed for pain. We will reassess the efficacy of this therapy in controlling her pain at the follow-up visit.

## 2017-06-05 NOTE — Assessment & Plan Note (Addendum)
Assessment  Her hemoglobin A1c today was elevated at 8.5. This is up from 7.4 four months ago. This is on glipizide 20 mg by mouth twice daily, Januvia 100 mg by mouth daily, and metformin 1000 mg by mouth twice daily. She relates this deterioration to drinking more coffee with regular sugar in it. Her weight is up 3 pounds but unlikely to cause this degree of increase in her hemoglobin A1c. In the past there has been some noncompliance with her oral hypoglycemics and this may be playing a role as well. We did not assess for persistent microalbuminuria at this visit.  Plan  We discussed the importance of limiting sugar including that in coffee and sweets. She would like to try to modify her diet prior to making any additional changes to her regimen. Therefore, we will continue glipizide 20 mg by mouth twice daily, Januvia 100 mg by mouth daily, metformin 1000 mg by mouth twice daily. We will reassess the efficacy of this regimen in addition to her lifestyle modifications with a repeat hemoglobin A1c at the follow-up visit. For her persistent microalbuminuria we will continue the lisinopril 20 mg by mouth daily.

## 2017-06-05 NOTE — Assessment & Plan Note (Signed)
Assessment  We spent at least 4 minutes discussing the importance of smoking cessation given her asthma. At this point she states that she is not ready to quit mentally.  Plan  I asked her to call us if she changed her mind, especially if she wanted some pharmacologic assistance in quitting smoking given her underlying asthma. At the follow-up visit we will reexplore her readiness to quit smoking and provide any pharmacologic help we can.

## 2017-06-05 NOTE — Assessment & Plan Note (Signed)
She is due for a Pap smear and was offered one today. She declined stating she would get it at the next visit. She is otherwise up-to-date on her health care maintenance.

## 2017-06-05 NOTE — Patient Instructions (Addendum)
It was good to see you again.  I am sorry you are having trouble sleeping.  1) I started sertraline 50 mg daily for feeling down.  This should kick in in 3 weeks.  2) I started trazadone 100 mg at night as needed for sleep.  This should work quicker.  3) Keep taking the medications as you are.  4) I will see you back in 3 months, sooner if necessary.

## 2017-06-05 NOTE — Assessment & Plan Note (Signed)
Assessment  She states that over the last couple of months she has become progressively more dysphoric and has had difficulty falling asleep. She also notes some anxiety. The holidays have been difficult for her as she has been alone. She is interested in pharmacotherapy for her depression and insomnia.  Plan  We will start sertraline 50 mg by mouth daily. She was told it may take up to 3 weeks for this medication to kick in. She was also started on trazodone 100 mg by mouth daily at bedtime as needed for insomnia. We will reassess the efficacy of this therapy with a repeat PHQ 9, as well as assessment of improvement in her associated insomnia.

## 2017-06-05 NOTE — Assessment & Plan Note (Signed)
Assessment  Her lichenoid dermatitis is well controlled on the triamcinolone 0.025% ointment twice daily as needed.  Plan  We will continue the triamcinolone 0.025% ointment twice daily as needed and reassess the efficacy of this therapy at the follow-up visit.

## 2017-06-05 NOTE — Assessment & Plan Note (Signed)
Assessment  She states that her asthma has been reasonably well-controlled on the as needed albuterol. She's not interested in returning back to the inhaled steroid. The albuterol is controlling her symptoms at twice a day as needed. She is aware that the cold dry weather associated with winter has been a precipitant of her asthma in the past and will prophylactically use the albuterol when she knows she'll be out in the cold.  Plan  We will continue the as needed albuterol and provide prophylactic albuterol when she knows she'll be out in the cold which has been a previous precipitator of her disease. She is not interested in smoking cessation at this time.

## 2017-06-24 ENCOUNTER — Ambulatory Visit (INDEPENDENT_AMBULATORY_CARE_PROVIDER_SITE_OTHER): Payer: Medicare HMO | Admitting: Podiatry

## 2017-06-24 ENCOUNTER — Ambulatory Visit (INDEPENDENT_AMBULATORY_CARE_PROVIDER_SITE_OTHER): Payer: Medicaid Other

## 2017-06-24 ENCOUNTER — Encounter: Payer: Self-pay | Admitting: Podiatry

## 2017-06-24 DIAGNOSIS — E1151 Type 2 diabetes mellitus with diabetic peripheral angiopathy without gangrene: Secondary | ICD-10-CM

## 2017-06-24 DIAGNOSIS — M2042 Other hammer toe(s) (acquired), left foot: Secondary | ICD-10-CM | POA: Diagnosis not present

## 2017-06-24 DIAGNOSIS — L84 Corns and callosities: Secondary | ICD-10-CM

## 2017-06-24 NOTE — Progress Notes (Signed)
  Subjective:  Patient ID: Krista Sosa, female    DOB: July 05, 1950,  MRN: [id number]  Chief Complaint  Patient presents with  . Routine Post Op    i have a hard spot on the 5th toe on my left foot    67 y.o. female returns for the above complaint.  Underwent left fifth toe arthroplasty 03/04/2017.  States that she has pain at the fifth toe due to significant callus.  Pain to palpation  Objective:  There were no vitals filed for this visit. General AA&O x3. Normal mood and affect.  Vascular  DP pulse 1 out of 4 bilateral PT pulse nonpalpable will make appointment with orthotist for possible diabetic shoes fabrication.  Neurologic Epicritic sensation grossly intact.  Dermatologic No open lesions. Skin normal texture and turgor.  Hyperkeratotic tissue overlying the proximal interphalangeal joint left fifth toe  Orthopedic:  Pain to palpation left fifth toe.  No pain on attempted range of motion of the fifth toe PIPJ   Assessment & Plan:  Patient was evaluated and treated and all questions answered.  Left fifth toe hammertoe -Lesion palliatively debrided see below. -X-rays reviewed suggestive of incomplete arthroplasty of the proximal phalangeal joint -Recommended diabetic shoes for patient to help to offload pre-ulcerative lesion the area which is technically pre-ulcerative due to her diabetes. -We will make appointment for possible diabetic shoe fabrication -Recommend urea cream for softening of the callus.  Return in about 6 weeks (around 08/05/2017) for hammertoe, DM Footcare.

## 2017-07-02 ENCOUNTER — Other Ambulatory Visit: Payer: Self-pay | Admitting: Internal Medicine

## 2017-07-02 DIAGNOSIS — M159 Polyosteoarthritis, unspecified: Secondary | ICD-10-CM

## 2017-08-06 ENCOUNTER — Ambulatory Visit: Payer: Medicaid Other | Admitting: Podiatry

## 2017-08-06 ENCOUNTER — Other Ambulatory Visit: Payer: Medicaid Other | Admitting: Orthotics

## 2017-08-20 ENCOUNTER — Ambulatory Visit (INDEPENDENT_AMBULATORY_CARE_PROVIDER_SITE_OTHER): Payer: Medicare HMO | Admitting: Podiatry

## 2017-08-20 ENCOUNTER — Ambulatory Visit: Payer: Medicaid Other | Admitting: Orthotics

## 2017-08-20 DIAGNOSIS — M7750 Other enthesopathy of unspecified foot: Secondary | ICD-10-CM

## 2017-08-20 DIAGNOSIS — E1151 Type 2 diabetes mellitus with diabetic peripheral angiopathy without gangrene: Secondary | ICD-10-CM

## 2017-08-20 DIAGNOSIS — M2042 Other hammer toe(s) (acquired), left foot: Secondary | ICD-10-CM

## 2017-08-20 DIAGNOSIS — L84 Corns and callosities: Secondary | ICD-10-CM

## 2017-08-20 DIAGNOSIS — M779 Enthesopathy, unspecified: Secondary | ICD-10-CM

## 2017-08-20 NOTE — Progress Notes (Signed)
Patient seen and measured for diabetic shoes under care of Dr. March Rummage.  Patient PCP managing DM2 is Lurena Joiner has DM2, PN, and Hammertoes.  Patient chose ORthoceet shoe we had in stock..8.5 M

## 2017-08-20 NOTE — Progress Notes (Signed)
  Subjective:  Patient ID: Krista Sosa, female    DOB: 24-May-1951,  MRN: [id number]  No chief complaint on file.  67 y.o. female returns for the above complaint.  Denies nausea vomiting fever chills.  Still significant pain at the left fifth toe.  Objective:  There were no vitals filed for this visit. General AA&O x3. Normal mood and affect.  Vascular  DP pulse 1 out of 4 bilateral PT pulse nonpalpable will make appointment with orthotist for possible diabetic shoes fabrication.  Neurologic Epicritic sensation grossly intact.  Dermatologic No open lesions. Skin normal texture and turgor.  Hyperkeratotic tissue overlying the proximal interphalangeal joint left fifth toe  Orthopedic: Pain to palpation left fifth toe.  No pain on attempted range of motion of the fifth toe PIPJ   Assessment & Plan:  Patient was evaluated and treated and all questions answered.  Left fifth toe hammertoe -Lesion palliatively debrided. -Continue diabetic shoes -Offered injection to the left fifth toe.  Patient agreed.  Patient withdrew her foot during injection he did not want to further proceed. -Should pain persist would consider possible further resection of the PIPJ.  Procedure: Joint Injection Location: Left 5th PIPJ joint Skin Prep: Alcohol. Injectate: 0.5 cc 1% lidocaine plain, 0.5 cc dexamethasone phosphate. Disposition: Patient tolerated procedure well. Injection site dressed with a band-aid.  Return in about 6 weeks (around 10/01/2017) for Hammertoe f/u.

## 2017-09-04 ENCOUNTER — Ambulatory Visit (INDEPENDENT_AMBULATORY_CARE_PROVIDER_SITE_OTHER): Payer: Medicare HMO | Admitting: Internal Medicine

## 2017-09-04 ENCOUNTER — Encounter: Payer: Self-pay | Admitting: Internal Medicine

## 2017-09-04 VITALS — BP 138/70 | HR 80 | Temp 98.6°F | Wt 156.7 lb

## 2017-09-04 DIAGNOSIS — F329 Major depressive disorder, single episode, unspecified: Secondary | ICD-10-CM

## 2017-09-04 DIAGNOSIS — E663 Overweight: Secondary | ICD-10-CM | POA: Diagnosis not present

## 2017-09-04 DIAGNOSIS — Z791 Long term (current) use of non-steroidal anti-inflammatories (NSAID): Secondary | ICD-10-CM | POA: Diagnosis not present

## 2017-09-04 DIAGNOSIS — L280 Lichen simplex chronicus: Secondary | ICD-10-CM | POA: Diagnosis not present

## 2017-09-04 DIAGNOSIS — R809 Proteinuria, unspecified: Secondary | ICD-10-CM

## 2017-09-04 DIAGNOSIS — J4520 Mild intermittent asthma, uncomplicated: Secondary | ICD-10-CM

## 2017-09-04 DIAGNOSIS — Z79899 Other long term (current) drug therapy: Secondary | ICD-10-CM | POA: Diagnosis not present

## 2017-09-04 DIAGNOSIS — Z7984 Long term (current) use of oral hypoglycemic drugs: Secondary | ICD-10-CM | POA: Diagnosis not present

## 2017-09-04 DIAGNOSIS — Z0000 Encounter for general adult medical examination without abnormal findings: Secondary | ICD-10-CM

## 2017-09-04 DIAGNOSIS — M159 Polyosteoarthritis, unspecified: Secondary | ICD-10-CM

## 2017-09-04 DIAGNOSIS — F17200 Nicotine dependence, unspecified, uncomplicated: Secondary | ICD-10-CM

## 2017-09-04 DIAGNOSIS — Z6825 Body mass index (BMI) 25.0-25.9, adult: Secondary | ICD-10-CM

## 2017-09-04 DIAGNOSIS — J302 Other seasonal allergic rhinitis: Secondary | ICD-10-CM

## 2017-09-04 DIAGNOSIS — E1129 Type 2 diabetes mellitus with other diabetic kidney complication: Secondary | ICD-10-CM | POA: Diagnosis not present

## 2017-09-04 DIAGNOSIS — J3089 Other allergic rhinitis: Secondary | ICD-10-CM

## 2017-09-04 DIAGNOSIS — Z720 Tobacco use: Secondary | ICD-10-CM

## 2017-09-04 LAB — GLUCOSE, CAPILLARY: GLUCOSE-CAPILLARY: 125 mg/dL — AB (ref 65–99)

## 2017-09-04 LAB — POCT GLYCOSYLATED HEMOGLOBIN (HGB A1C): Hemoglobin A1C: 8.2

## 2017-09-04 MED ORDER — SERTRALINE HCL 100 MG PO TABS
100.0000 mg | ORAL_TABLET | Freq: Every day | ORAL | 3 refills | Status: DC
Start: 2017-09-04 — End: 2017-10-09

## 2017-09-04 MED ORDER — VITAMIN B-12 1000 MCG PO TABS: 1000.0000 ug | ORAL_TABLET | Freq: Every day | ORAL | 3 refills | Status: DC

## 2017-09-04 NOTE — Assessment & Plan Note (Signed)
Assessment  Her dermatitis is well controlled on the triamcinolone 0.025% ointment twice daily as needed.  Plan  We will continue with the triamcinolone 0.025% ointment twice daily as needed and reassess the efficacy of this therapy at the follow-up visit.

## 2017-09-04 NOTE — Assessment & Plan Note (Signed)
Assessment  Although her diabetes is not optimally controlled she did have an improvement in her hemoglobin A1c over the last 3 months from 8.5 to 8.2.  She states she's been compliant with her regimen, which includes metformin 1000 mg by mouth twice daily, glipizide XL 20 mg by mouth daily, and Januvia 100 mg by mouth daily.  Unfortunately, she did not make the switch to artificial sweeteners in her coffee and has continued to use sugar in her coffee. She has lost 2 pounds over the last 3 months and this likely has helped with the improvement in her glycemic control.  We obtained a urine for microalbumin and it is pending at the time of this dictation.  Plan  We will continue with the metformin at 1000 mg by mouth twice daily, glipizide XL 20 mg by mouth daily, and Januvia 100 mg by mouth daily. We again discussed the importance of switching out sugar in her coffee with a sugar substitute and I gave her some ideas on where to purchase it. I encouraged her to continue to work on weight loss as she has done in order to further improve her sugar control. We will reassess her glycemic control at the follow-up visit.we will follow-up on the results of the urine microalbumin and continue the lisinopril 20 mg by mouth daily for the persistent microalbuminuria.

## 2017-09-04 NOTE — Progress Notes (Signed)
   Subjective:    Patient ID: Krista Sosa, female    DOB: [birth year], 67 y.o.   MRN: [id number]  HPI  Krista Sosa is here for follow-up of her type 2 diabetes with microalbuminuria, tobacco use disorder, allergic rhinitis, mild intermittent asthma, generalized osteoarthritis, lichenoid dermatitis, major depressive disorder, and overweight status. Please see the A&P for the status of the pt's chronic medical problems.  Review of Systems  Constitutional: Positive for fatigue. Negative for activity change, appetite change and unexpected weight change.  HENT: Positive for congestion and rhinorrhea.   Respiratory: Negative for chest tightness, shortness of breath and wheezing.   Cardiovascular: Negative for chest pain, palpitations and leg swelling.  Gastrointestinal: Negative for abdominal distention, abdominal pain, constipation, diarrhea, nausea and vomiting.  Genitourinary: Negative for difficulty urinating.  Musculoskeletal: Positive for arthralgias. Negative for joint swelling and myalgias.  Skin: Negative for rash.  Allergic/Immunologic: Positive for environmental allergies.  Psychiatric/Behavioral: Positive for dysphoric mood and sleep disturbance.      Objective:   Physical Exam  Constitutional: She is oriented to person, place, and time. She appears well-developed and well-nourished. No distress.  HENT:  Head: Normocephalic and atraumatic.  Eyes: Conjunctivae are normal. Right eye exhibits no discharge. Left eye exhibits no discharge. No scleral icterus.  Cardiovascular: Normal rate, regular rhythm and normal heart sounds. Exam reveals no gallop and no friction rub.  No murmur heard. Pulmonary/Chest: Effort normal and breath sounds normal. No respiratory distress. She has no wheezes. She has no rales.  Abdominal: Soft. Bowel sounds are normal. She exhibits no distension. There is no tenderness. There is no rebound and no guarding.  Musculoskeletal: Normal range of  motion. She exhibits no edema, tenderness or deformity.  Neurological: She is alert and oriented to person, place, and time. She exhibits normal muscle tone.  Skin: Skin is warm and dry. No rash noted. She is not diaphoretic.  Psychiatric: She has a normal mood and affect. Her behavior is normal. Judgment and thought content normal.  Nursing note and vitals reviewed.     Assessment & Plan:   Please see problem oriented charting.

## 2017-09-04 NOTE — Assessment & Plan Note (Signed)
Assessment  Her arthritic pain is well controlled on ibuprofen 800 mg by mouth every 8 hours as needed.  Plan  We will continue the ibuprofen at 800 mg by mouth every 8 hours and reassess the efficacy of this therapy in managing her symptoms at the follow-up visit.

## 2017-09-04 NOTE — Assessment & Plan Note (Signed)
Assessment  She lost another 2 pounds since the last clinic visit and her BMI is 25.2. She was praised on her weight loss and it is approaching a normal BMI.  Plan  We talked about sugar in her coffee and I am hopeful that if we can eliminate this we may be able to slowly reduce her weight further and achieve a normal BMI. We will reassess her weight at the follow-up visit.

## 2017-09-04 NOTE — Assessment & Plan Note (Signed)
She was again offered a opportunity to receive a Pap smear today and continues to defer. She leads me to believe she's no longer interested in Pap smears from this point onward. She is otherwise up-to-date on her health care maintenance.

## 2017-09-04 NOTE — Patient Instructions (Signed)
It was good to see you again.  1) Keep trying to avoid sugar in the coffee.  Invest in some artifical sugar.  Splenda substitute (Sucralfate).  2) I increased the sertraline to 100 mg daily.  You can use 2 pills a day until you run out.  The new bottle will be just 1 pill a day again.  3) Keep taking your other medications as you are.  I will see you back in about 3 months, sooner if necessary.

## 2017-09-04 NOTE — Assessment & Plan Note (Signed)
Assessment  She remains in the pre-contemplative stage. We discussed the health benefits in her particular situation given her asthma, and she is well aware of this.  Plan  We will continue to reassess her readiness for smoking cessation at each follow-up visit and offer pharmacotherapy to assist were she to progress to the stage where she becomes ready to quit.

## 2017-09-04 NOTE — Assessment & Plan Note (Signed)
Assessment  Although she noted some improvement in her depressive symptoms on the sertraline 50 mg by mouth daily her PHQ-9 today was 4 and she continues to have difficulty staying asleep.  I'm encouraged with the improvement in her symptoms and believe that she may benefit from an increase in the sertraline dose. She is willing to give this a try.  Plan  We will increase the sertraline to 100 mg by mouth daily and reassess the efficacy of this therapy in managing her depression in improving her sleep at the follow-up visit.

## 2017-09-04 NOTE — Assessment & Plan Note (Signed)
Assessment  She's had a particularly difficult time with the elevated pollen counts at this time of the year. That said, she states that the Flonase is helpful in managing her symptoms.  Plan  We will continue the Flonase and I encouraged her to consider an antihistamine over-the-counter should she require additional therapy. We will reassess her needs for allergic rhinitis therapy once we get through the tree pollen season.

## 2017-09-04 NOTE — Assessment & Plan Note (Signed)
Assessment  Surprisingly her asthma is well-controlled despite the continued smoking and exacerbation of her allergic rhinitis. She is able to manage her symptoms with infrequent albuterol therapy.  Plan  We will continue the albuterol as needed either via an MDI or nebulizer. We will reassess the efficacy of this in controlling her asthma at the follow-up visit. We will also continue to encourage smoking cessation at each visit.

## 2017-09-05 LAB — MICROALBUMIN / CREATININE URINE RATIO
Creatinine, Urine: 127.2 mg/dL
Microalb/Creat Ratio: 147.4 mg/g creat — ABNORMAL HIGH (ref 0.0–30.0)
Microalbumin, Urine: 187.5 ug/mL

## 2017-09-06 ENCOUNTER — Other Ambulatory Visit: Payer: Self-pay | Admitting: Internal Medicine

## 2017-09-06 DIAGNOSIS — F329 Major depressive disorder, single episode, unspecified: Secondary | ICD-10-CM

## 2017-09-10 ENCOUNTER — Other Ambulatory Visit: Payer: Medicare HMO | Admitting: Orthotics

## 2017-09-17 NOTE — Progress Notes (Signed)
Patient ID: Krista Sosa, female   DOB: 06-12-1950, 67 y.o.   MRN: [id number]  Urine creatinine 127.2 Urine microalbumin 187.5 Microalbumin/creatinine 147.4  If blood pressure can tolerate, will increase lisinopril to 40 mg daily at follow-up visit.

## 2017-09-23 ENCOUNTER — Telehealth: Payer: Self-pay | Admitting: Podiatry

## 2017-09-23 NOTE — Telephone Encounter (Signed)
Pt called and left message for me checking on diabetic shoes.  I returned call and left a message that the inserts are scheduled to be delivered tomorrow and she is scheduled to see Dr March Rummage 5.9 and she can pick them up at that time or I can schedule her to see Betha on 5.7.19. What ever works best for her.Marland Kitchen

## 2017-10-01 ENCOUNTER — Ambulatory Visit (INDEPENDENT_AMBULATORY_CARE_PROVIDER_SITE_OTHER): Payer: Medicare HMO | Admitting: Podiatry

## 2017-10-01 DIAGNOSIS — L84 Corns and callosities: Secondary | ICD-10-CM | POA: Diagnosis not present

## 2017-10-01 DIAGNOSIS — M2042 Other hammer toe(s) (acquired), left foot: Secondary | ICD-10-CM

## 2017-10-01 DIAGNOSIS — E1151 Type 2 diabetes mellitus with diabetic peripheral angiopathy without gangrene: Secondary | ICD-10-CM | POA: Diagnosis not present

## 2017-10-01 DIAGNOSIS — M7750 Other enthesopathy of unspecified foot: Secondary | ICD-10-CM | POA: Diagnosis not present

## 2017-10-02 DIAGNOSIS — H26491 Other secondary cataract, right eye: Secondary | ICD-10-CM | POA: Diagnosis not present

## 2017-10-07 ENCOUNTER — Telehealth: Payer: Self-pay | Admitting: Pharmacist

## 2017-10-07 NOTE — Patient Outreach (Signed)
[facility]) Care Management  [place]   10/07/2017  Krista Sosa 10-16-1950 [id number]  67 y.o. year old female referred to [place] for Medication Adherence ([place] outreach) Referred through internal audit of proportion days covered or oral antihyperglycemics, antihypertensives, and antihyperlipidemics as patient's pharmacy fill history suggests she is noncompliant.   PMH s/f: T2DM, HTN, HLD, asthma, GERD, persistent microalbuminuria, OA, tobacco abuse disorder, overweight, major depressive disorder.  Patient with [facility] medicare advantage plan.    Patient confirms identity with HIPAA-identifiers x2 and gives verbal consent to speak over the phone about medications.    SUBJECTIVE:   Medication Adherence: Patient reports she takes her medications as directed but does have to go without medications at times 2/2 cost. Out of aspirin at this time. Reports she has all other medications.    Medication Assistance:  "Sometimes I don't have the money when they call to refill it." Fills some meds at [facility] on [address] - allergy meds and acute meds. Gets most meds delivered from [employer]. Patient interested in getting meds through mail order. Understands she may be able to get $300 of OTC meds/quarter free of charge.    OBJECTIVE: Last office visit with primary care provider, Dr. Eppie Gibson on 4/12 notes that patient is to continue on metformin 1000 mg BID, glipizide XL 20 mg daily, Januvia 100 mg daily, lisinopril 20 mg daily, and no mention of change for simvastatin noted, assuming patient is to continue on simvastatin 20 mg daily.  Lipid Panel     Component Value Date/Time   CHOL 165 02/25/2013 1139   TRIG 116 02/25/2013 1139   HDL 45 02/25/2013 1139   CHOLHDL 3.7 02/25/2013 1139   VLDL 23 02/25/2013 1139   LDLCALC 97 02/25/2013 1139   BMET    Component Value Date/Time   NA 140 01/29/2017 1112   K 4.3 01/29/2017 1112   CL 105  01/29/2017 1112   CO2 22 01/29/2017 1112   GLUCOSE 113 (H) 01/29/2017 1112   GLUCOSE 227 (H) 03/16/2014 1328   BUN 11 01/29/2017 1112   CREATININE 0.47 (L) 01/29/2017 1112   CREATININE 0.61 02/25/2013 1139   CALCIUM 9.7 01/29/2017 1112   GFRNONAA 105 01/29/2017 1112   GFRNONAA >89 02/25/2013 1139   GFRAA 121 01/29/2017 1112   GFRAA >89 02/25/2013 1139   MICROALBUMIN 187.5 (09/04/2017)   Encounter Medications: Outpatient Encounter Medications as of 10/07/2017  Medication Sig  . ACCU-CHEK FASTCLIX LANCETS MISC 1 each by Does not apply route every morning.  Marland Kitchen albuterol (PROVENTIL) (2.5 MG/3ML) 0.083% nebulizer solution Take 3 mLs (2.5 mg total) by nebulization every 6 (six) hours as needed for shortness of breath.  Marland Kitchen albuterol (VENTOLIN HFA) 108 (90 Base) MCG/ACT inhaler Inhale 1-2 puffs into the lungs every 6 (six) hours as needed for wheezing or shortness of breath.  . Blood Glucose Monitoring Suppl (ACCU-CHEK NANO SMARTVIEW) W/DEVICE KIT 1 each by Does not apply route every morning.  Marland Kitchen glipiZIDE (GLUCOTROL XL) 10 MG 24 hr tablet Take 2 tablets (20 mg total) by mouth daily with breakfast.  . glucose blood (ONE TOUCH ULTRA TEST) test strip USE TO TEST BLOOD GLUCOSE 1-2 TIMES A DAY  . ibuprofen (ADVIL,MOTRIN) 800 MG tablet Take 1 tablet (800 mg total) by mouth every 8 (eight) hours as needed for moderate pain.  Marland Kitchen lisinopril (PRINIVIL,ZESTRIL) 20 MG tablet Take 1 tablet (20 mg total) by mouth daily.  . metFORMIN (GLUCOPHAGE) 1000 MG tablet Take 1  tablet (1,000 mg total) by mouth 2 (two) times daily with a meal.  . sertraline (ZOLOFT) 100 MG tablet Take 1 tablet (100 mg total) by mouth daily.  . simvastatin (ZOCOR) 20 MG tablet Take 1 tablet (20 mg total) by mouth every evening.  . sitaGLIPtin (JANUVIA) 100 MG tablet Take 1 tablet (100 mg total) by mouth daily.  . traZODone (DESYREL) 100 MG tablet Take 1 tablet (100 mg total) by mouth at bedtime as needed for sleep.  Marland Kitchen triamcinolone  (KENALOG) 0.025 % ointment APPLY TOPICALLY TWICE DAILY AS DIRECTED  . triamcinolone (NASACORT ALLERGY 24HR) 55 MCG/ACT AERO nasal inhaler Place 2 sprays into the nose daily as needed.  . vitamin B-12 (CYANOCOBALAMIN) 1000 MCG tablet Take 1 tablet (1,000 mcg total) by mouth daily.  Marland Kitchen aspirin EC 81 MG tablet Take 1 tablet (81 mg total) by mouth daily. (Patient not taking: Reported on 10/07/2017)  . [DISCONTINUED] fluticasone (FLONASE) 50 MCG/ACT nasal spray Place 2 sprays into the nose daily as needed.  . [DISCONTINUED] Triamcinolone Acetonide (TRIAMCINOLONE 0.1 % CREAM : EUCERIN) CREA Apply 1 application topically 2 (two) times daily as needed.   No facility-administered encounter medications on file as of 10/07/2017.     Functional Status: In your present state of health, do you have any difficulty performing the following activities: 09/04/2017 06/05/2017  Hearing? N -  Vision? Y Y  Difficulty concentrating or making decisions? N -  Walking or climbing stairs? N -  Dressing or bathing? N -  Doing errands, shopping? N -  Some recent data might be hidden    Fall/Depression Screening: Fall Risk  09/04/2017 06/05/2017 01/29/2017  Falls in the past year? No No No  Number falls in past yr: - - -  Injury with Fall? - - -  Risk for fall due to : - Impaired vision -  Risk for fall due to: Comment - - -   PHQ 2/9 Scores 09/04/2017 06/05/2017 01/29/2017 06/20/2016 02/14/2016 10/19/2015 07/28/2014  PHQ - 2 Score 1 1 0 0 0 0 0  PHQ- 9 Score 4 - - - - - -   ASSESSMENT: Drugs sorted by system:  Neurologic/Psychologic:sertraline, trazodone  Cardiovascular:simvastatin, aspirin 81 mg, lisinopril  Pulmonary/Allergy:nasacort nasal spray, albuterol inhaler (ventolin) and nebs  Endocrine:glipizide XL, metformin, Januvia  Topical:triamcinolone cream  Pain:ibuprofen   CHENIDPO/EUMPNTIR:W43   Duplications in therapy: none Gaps in therapy: none Medications to avoid in the elderly: none Drug interactions:  none clinically significnat Other issues noted: adherence 2/2 cost   PLAN: #Medication assistance - patient with [employer] Medicare Advantage HMO plan not in coverage gap, interested in OTC benefit included in insurance and interested in switching to mail order pharmacy if this will reduce cost.  -[employer] called patient's insurance company with her on the line: requested catalog for OTC benefit items to be mailed to her home (updated address), ordered aspirin 81 mg, b12 1000 mcg, and Nasacort 24hr OTC (patient prefers to d/c flonase and try this instead as it is $0).  -Also requested that mail order pharmacy fill the following medications: ventolin, albuterol nebs, glipizide XL, metformin, januvia, lisinopril, ibuprofen, sertraline, simvastatin, trazodone, triamcinolone ointment for 90DS. They reports all meds will be $0 except januvia and ventolin which will cost $3.80 each for a 90DS.  -Patient placed order for new glucometer and test strips (BID testing) at $0 to the patient  Call had to be cut short by the patient and mail order pharmacy was unable to obtain payment information  for the $7.60 owed. They report they will fax Rx order requests to Dr. Eppie Gibson for 90DS of the above medications and that they will send an invoice to the patient's home for payment.   Will follow up with patient in 2 business days to explain further. Will route this note to Dr. Eppie Gibson.    Carlean Jews, Pharm.D., BCPS PGY2 Ambulatory Care Pharmacy Resident Phone: [phone]

## 2017-10-08 ENCOUNTER — Telehealth: Payer: Self-pay | Admitting: *Deleted

## 2017-10-08 ENCOUNTER — Other Ambulatory Visit: Payer: Self-pay | Admitting: Internal Medicine

## 2017-10-08 DIAGNOSIS — J4530 Mild persistent asthma, uncomplicated: Secondary | ICD-10-CM

## 2017-10-08 DIAGNOSIS — J3089 Other allergic rhinitis: Secondary | ICD-10-CM

## 2017-10-08 DIAGNOSIS — E1129 Type 2 diabetes mellitus with other diabetic kidney complication: Secondary | ICD-10-CM

## 2017-10-08 DIAGNOSIS — R809 Proteinuria, unspecified: Principal | ICD-10-CM

## 2017-10-08 DIAGNOSIS — F329 Major depressive disorder, single episode, unspecified: Secondary | ICD-10-CM

## 2017-10-08 DIAGNOSIS — J302 Other seasonal allergic rhinitis: Secondary | ICD-10-CM

## 2017-10-08 DIAGNOSIS — L280 Lichen simplex chronicus: Secondary | ICD-10-CM

## 2017-10-08 DIAGNOSIS — M159 Polyosteoarthritis, unspecified: Secondary | ICD-10-CM

## 2017-10-08 NOTE — Telephone Encounter (Signed)
Received fax from [facility] for refills. I called pt - stated she switched over to [facility] yesterday; unable to afford meds from [facility] and she does not need any refills at this time. Informed her, when she close for refills, to call us  - stated she will. I will change her pharmacy on her chart.

## 2017-10-09 MED ORDER — VITAMIN B-12 1000 MCG PO TABS: 1000.0000 ug | ORAL_TABLET | Freq: Every day | ORAL | 3 refills | Status: DC

## 2017-10-09 MED ORDER — TRUE METRIX AIR GLUCOSE METER W/DEVICE KIT: PACK | 0 refills | Status: DC

## 2017-10-09 MED ORDER — GLUCOSE BLOOD VI STRP: ORAL_STRIP | 3 refills | Status: DC

## 2017-10-09 MED ORDER — LISINOPRIL 20 MG PO TABS: 20.0000 mg | ORAL_TABLET | Freq: Every day | ORAL | 3 refills | Status: DC

## 2017-10-09 MED ORDER — TRIAMCINOLONE ACETONIDE 0.025 % EX OINT: TOPICAL_OINTMENT | CUTANEOUS | 3 refills | Status: DC

## 2017-10-09 MED ORDER — TRUEPLUS LANCETS 33G MISC: 3 refills | Status: DC

## 2017-10-09 MED ORDER — SIMVASTATIN 20 MG PO TABS: 20.0000 mg | ORAL_TABLET | Freq: Every evening | ORAL | 3 refills | Status: DC

## 2017-10-09 MED ORDER — GLIPIZIDE ER 10 MG PO TB24: 20.0000 mg | ORAL_TABLET | Freq: Every day | ORAL | 3 refills | Status: DC

## 2017-10-09 MED ORDER — ALBUTEROL SULFATE HFA 108 (90 BASE) MCG/ACT IN AERS: 1.0000 | INHALATION_SPRAY | Freq: Four times a day (QID) | RESPIRATORY_TRACT | 3 refills | Status: DC | PRN

## 2017-10-09 MED ORDER — SERTRALINE HCL 100 MG PO TABS: 100.0000 mg | ORAL_TABLET | Freq: Every day | ORAL | 3 refills | Status: DC

## 2017-10-09 MED ORDER — SITAGLIPTIN PHOSPHATE 100 MG PO TABS: 100.0000 mg | ORAL_TABLET | Freq: Every day | ORAL | 3 refills | Status: DC

## 2017-10-09 MED ORDER — ALBUTEROL SULFATE (2.5 MG/3ML) 0.083% IN NEBU: 2.5000 mg | INHALATION_SOLUTION | Freq: Four times a day (QID) | RESPIRATORY_TRACT | 3 refills | Status: DC | PRN

## 2017-10-09 MED ORDER — IBUPROFEN 800 MG PO TABS: 800.0000 mg | ORAL_TABLET | Freq: Three times a day (TID) | ORAL | 3 refills | Status: DC | PRN

## 2017-10-09 MED ORDER — TRAZODONE HCL 100 MG PO TABS: 100.0000 mg | ORAL_TABLET | Freq: Every evening | ORAL | 3 refills | Status: DC | PRN

## 2017-10-14 ENCOUNTER — Telehealth: Payer: Self-pay | Admitting: Pharmacist

## 2017-10-14 NOTE — Patient Outreach (Signed)
[facility] [facility]) Care Management  10/14/2017  Krista Sosa 01/17/1951 [id number]   67 y.o. year old female referred to [place] for Medication Assistance (Pharmacy telephone outreach)  Called patient to f/u and ensure she got all of her medications. She reports she has not received meds from [facility] yet. Explained that patient needs to call them to set up payment for Januvia and ventolin. Patient verbalized understanding and agrees to follow up call on 09/29/2017. Patient expresses thanks for my help.   Provided patient phone number to [facility] - mail order. Patient reports she has already informed previous pharmacy that she won't be filling meds with them anymore. Will follow up 09/29/17 with patient.   Carlean Jews, Pharm.D., BCPS PGY2 Ambulatory Care Pharmacy Resident Phone: [phone]

## 2017-10-30 ENCOUNTER — Telehealth: Payer: Self-pay | Admitting: Pharmacist

## 2017-10-30 NOTE — Telephone Encounter (Signed)
Open in error under wrong context

## 2017-10-30 NOTE — Patient Outreach (Signed)
[place] [facility]) Care Management  10/30/2017  Krista Sosa 1951-01-20 [id number]   Called patient to follow up on previous conversation. Patient reports that she has all medications at this time including Januvia and albuterol inhaler. Patient asks for assistance ordering more OTC supplies. [facility]way called [place] to order the following items together: digital blood pressure cuff, toothbrush and toothpaste, diabetic socks, incontinence underwear, alcohol pads, colace, vitamin b12 and bandaids. Exhausted her allowed $300/quarter with this order. Next quarter starts 11/23/17 and she will have another $300 to spend on OTC products at no cost to the patient.   Patient thanks me for my help and requests instructions on how to order these products in the future. States that she already has the order catalog. Otherwise, denies further pharmacy need. Will send letter to patient with instructions on how to order and will otherwise close pharmacy case.   Carlean Jews, Pharm.D., BCPS PGY2 Ambulatory Care Pharmacy Resident Phone: [phone]

## 2017-11-12 ENCOUNTER — Encounter: Payer: Self-pay | Admitting: Podiatry

## 2017-11-12 ENCOUNTER — Ambulatory Visit (INDEPENDENT_AMBULATORY_CARE_PROVIDER_SITE_OTHER): Payer: Medicare HMO | Admitting: Podiatry

## 2017-11-12 DIAGNOSIS — M2042 Other hammer toe(s) (acquired), left foot: Secondary | ICD-10-CM | POA: Diagnosis not present

## 2017-11-12 NOTE — Patient Instructions (Signed)
Pre-Operative Instructions  Congratulations, you have decided to take an important step towards improving your quality of life.  You can be assured that the doctors and staff at [facility] will be with you every step of the way.  Here are some important things you should know:  1. Plan to be at the surgery center/hospital at least 1 (one) hour prior to your scheduled time, unless otherwise directed by the surgical center/hospital staff.  You must have a responsible adult accompany you, remain during the surgery and drive you home.  Make sure you have directions to the surgical center/hospital to ensure you arrive on time. 2. If you are having surgery at [facility] or [facility] hospitals, you will need a copy of your medical history and physical form from your family physician within one month prior to the date of surgery. We will give you a form for your primary physician to complete.  3. We make every effort to accommodate the date you request for surgery.  However, there are times where surgery dates or times have to be moved.  We will contact you as soon as possible if a change in schedule is required.   4. No aspirin/ibuprofen for one week before surgery.  If you are on aspirin, any non-steroidal anti-inflammatory medications (Mobic, Aleve, Ibuprofen) should not be taken seven (7) days prior to your surgery.  You make take Tylenol for pain prior to surgery.  5. Medications - If you are taking daily heart and blood pressure medications, seizure, reflux, allergy, asthma, anxiety, pain or diabetes medications, make sure you notify the surgery center/hospital before the day of surgery so they can tell you which medications you should take or avoid the day of surgery. 6. No food or drink after midnight the night before surgery unless directed otherwise by surgical center/hospital staff. 7. No alcoholic beverages 24-hours prior to surgery.  No smoking 24-hours prior or 24-hours after  surgery. 8. Wear loose pants or shorts. They should be loose enough to fit over bandages, boots, and casts. 9. Don't wear slip-on shoes. Sneakers are preferred. 10. Bring your boot with you to the surgery center/hospital.  Also bring crutches or a walker if your physician has prescribed it for you.  If you do not have this equipment, it will be provided for you after surgery. 11. If you have not been contacted by the surgery center/hospital by the day before your surgery, call to confirm the date and time of your surgery. 12. Leave-time from work may vary depending on the type of surgery you have.  Appropriate arrangements should be made prior to surgery with your employer. 13. Prescriptions will be provided immediately following surgery by your doctor.  Fill these as soon as possible after surgery and take the medication as directed. Pain medications will not be refilled on weekends and must be approved by the doctor. 14. Remove nail polish on the operative foot and avoid getting pedicures prior to surgery. 15. Wash the night before surgery.  The night before surgery wash the foot and leg well with water and the antibacterial soap provided. Be sure to pay special attention to beneath the toenails and in between the toes.  Wash for at least three (3) minutes. Rinse thoroughly with water and dry well with a towel.  Perform this wash unless told not to do so by your physician.  Enclosed: 1 Ice pack (please put in freezer the night before surgery)   1 Hibiclens skin cleaner     Pre-op instructions  If you have any questions regarding the instructions, please do not hesitate to call our office.  [place]: [address], [place], [place] [address] -- [phone]  [place]: [address] [place], [place] [address] -- [phone]  [place]: [address]  [place], [place] [address] -- [phone]  [place]: [address], [place], [place] [address] -- [phone]  Website:  [email or web address] 

## 2017-11-15 NOTE — Progress Notes (Signed)
  Subjective:  Patient ID: EGYPT WELCOME, female    DOB: Dec 18, 1950,  MRN: [id number]  Chief Complaint  Patient presents with  . Hammer Toe    left foot, 5th toe is still painful but not requesting any injections   67 y.o. female returns for the above complaint.  States the left foot is still painful and she is getting tired of it.  Denies change symptoms.  Denies wanting an injection today  Objective:  There were no vitals filed for this visit. General AA&O x3. Normal mood and affect.  Vascular  DP pulse 1 out of 4 bilateral PT pulse nonpalpable will make appointment with orthotist for possible diabetic shoes fabrication.  Neurologic Epicritic sensation grossly intact.  Dermatologic No open lesions. Skin normal texture and turgor.  Hyperkeratotic tissue overlying the proximal interphalangeal joint left fifth toe  Orthopedic: Pain to palpation left fifth toe.  No pain on attempted range of motion of the fifth toe PIPJ   Assessment & Plan:  Patient was evaluated and treated and all questions answered.  Left fifth toe hammertoe -Discussed benefit of revision of the left fifth hammertoe with more bone resection.  Patient understands and wishes to proceed.  All risk benefits are discussed with patient.  Consent reviewed.  Return for post op.

## 2017-11-20 LAB — HM DIABETES EYE EXAM

## 2017-12-02 ENCOUNTER — Other Ambulatory Visit: Payer: Self-pay | Admitting: Podiatry

## 2017-12-02 DIAGNOSIS — J45909 Unspecified asthma, uncomplicated: Secondary | ICD-10-CM | POA: Diagnosis not present

## 2017-12-02 DIAGNOSIS — M2042 Other hammer toe(s) (acquired), left foot: Secondary | ICD-10-CM | POA: Diagnosis not present

## 2017-12-02 MED ORDER — PROMETHAZINE HCL 25 MG PO TABS: 25.0000 mg | ORAL_TABLET | Freq: Three times a day (TID) | ORAL | 0 refills | Status: DC | PRN

## 2017-12-02 MED ORDER — OXYCODONE-ACETAMINOPHEN 10-325 MG PO TABS: 1.0000 | ORAL_TABLET | ORAL | 0 refills | Status: DC | PRN

## 2017-12-02 MED ORDER — CEPHALEXIN 500 MG PO CAPS: 500.0000 mg | ORAL_CAPSULE | Freq: Two times a day (BID) | ORAL | 0 refills | Status: DC

## 2017-12-04 ENCOUNTER — Encounter: Payer: Self-pay | Admitting: Podiatry

## 2017-12-04 ENCOUNTER — Ambulatory Visit (INDEPENDENT_AMBULATORY_CARE_PROVIDER_SITE_OTHER): Payer: Medicare HMO | Admitting: Podiatry

## 2017-12-04 DIAGNOSIS — M2042 Other hammer toe(s) (acquired), left foot: Secondary | ICD-10-CM

## 2017-12-04 DIAGNOSIS — Z9889 Other specified postprocedural states: Secondary | ICD-10-CM

## 2017-12-04 MED ORDER — OXYCODONE-ACETAMINOPHEN 10-325 MG PO TABS: 1.0000 | ORAL_TABLET | ORAL | 0 refills | Status: DC | PRN

## 2017-12-04 NOTE — Progress Notes (Signed)
  Subjective:  Patient ID: Krista Sosa, female    DOB: 1950/12/23,  MRN: [id number]  Chief Complaint  Patient presents with  . Routine Post Op    1 dos 07.10.2019 Hammertoe Repair 56th Lt " my little toe hurts"     DOS: 12/02/17 Procedure: Revision hammertoe L 5th toe  67 y.o. female returns for post-op check. Denies N/V/F/Ch. Pain is controlled with current medications.  Objective:   General AA&O x3. Normal mood and affect.  Vascular Foot warm and well perfused.  Neurologic Gross sensation intact.  Dermatologic Skin healing well without signs of infection. Skin edges well coapted without signs of infection.  Orthopedic: Tenderness to palpation noted about the surgical site.    Assessment & Plan:  Patient was evaluated and treated and all questions answered.  S/p Hammertoe revision L 5th toe -Progressing as expected post-operatively. -Sutures: intact. -Medications refilled: Percocet -Foot redressed.  Return in about 1 week (around 12/11/2017).

## 2017-12-08 ENCOUNTER — Encounter: Payer: Self-pay | Admitting: *Deleted

## 2017-12-11 ENCOUNTER — Ambulatory Visit (INDEPENDENT_AMBULATORY_CARE_PROVIDER_SITE_OTHER): Payer: Self-pay | Admitting: Podiatry

## 2017-12-11 DIAGNOSIS — Z9889 Other specified postprocedural states: Secondary | ICD-10-CM

## 2017-12-11 DIAGNOSIS — M2042 Other hammer toe(s) (acquired), left foot: Secondary | ICD-10-CM

## 2017-12-11 NOTE — Progress Notes (Signed)
  Subjective:  Patient ID: Krista Sosa, female    DOB: 05-10-1951,  MRN: [id number]  Chief Complaint  Patient presents with  . Routine [employer] 07.10.2019 Hammertoe Repair 5th Lt     DOS: 12/02/17 Procedure: Revision hammertoe L 5th toe  67 y.o. female returns for post-op check. Denies N/V/F/Ch. Pain improving. Sore spot present in between the toes.  Objective:   General AA&O x3. Normal mood and affect.  Vascular Foot warm and well perfused.  Neurologic Gross sensation intact.  Dermatologic Skin healing well without signs of infection. Skin edges well coapted without signs of infection. Maceration present medial aspect  L 5th toe.  Orthopedic: Tenderness to palpation noted about the surgical site.    Assessment & Plan:  Patient was evaluated and treated and all questions answered.  S/p Hammertoe revision L 5th toe -Progressing as expected post-operatively. -Sutures: intact. -Castellani's paint applied for Interdigital maceration  -Medications refilled: none -Foot redressed.  Return in about 1 week (around 12/18/2017) for post op suture removal.

## 2017-12-13 NOTE — Progress Notes (Signed)
  Subjective:  Patient ID: Krista Sosa, female    DOB: [birth year],  MRN: [id number]  No chief complaint on file.  67 y.o. female returns for the above complaint.  Still having pain in the toe.  Reports significant callus. Objective:  There were no vitals filed for this visit. General AA&O x3. Normal mood and affect.  Vascular  DP pulse 1 out of 4 bilateral PT pulse nonpalpable will make appointment with orthotist for possible diabetic shoes fabrication.  Neurologic Epicritic sensation grossly intact.  Dermatologic No open lesions. Skin normal texture and turgor.  Hyperkeratotic tissue overlying the proximal interphalangeal joint left fifth toe  Orthopedic: Pain to palpation left fifth toe.  No pain on attempted range of motion of the fifth toe PIPJ   Assessment & Plan:  Patient was evaluated and treated and all questions answered.  Left fifth toe hammertoe -Lesion palliatively debrided. -Continue diabetic shoes -Should pain persist would consider possible further resection of the PIPJ.  Return in about 6 weeks (around 11/12/2017) for Hammertoe F/u.

## 2017-12-18 ENCOUNTER — Ambulatory Visit (INDEPENDENT_AMBULATORY_CARE_PROVIDER_SITE_OTHER): Payer: Medicare HMO | Admitting: Podiatry

## 2017-12-18 ENCOUNTER — Ambulatory Visit (INDEPENDENT_AMBULATORY_CARE_PROVIDER_SITE_OTHER): Payer: Medicare HMO

## 2017-12-18 DIAGNOSIS — M2042 Other hammer toe(s) (acquired), left foot: Secondary | ICD-10-CM

## 2017-12-22 NOTE — Progress Notes (Signed)
Patient presents status post fifth hammertoe repair left foot.  Date of surgery 12/02/2017.  She currently denies any pain.  She denies fever, chills, nausea, vomiting.  Noted well-healing surgical areas.  No swelling, no redness, no erythema, no drainage.  All sutures were removed wound edges well coapted with no gapping.  Interdigital maceration resolved.  Patient's vital signs are stable she is afebrile, with a temperature of 97.1  She is to continue wearing her surgical shoe for 1 week then she can try to wear a comfortable athletic shoe that has space in the toes.  She is to follow-up with Dr. March Rummage in 2 weeks or sooner if any problems arise.

## 2017-12-25 ENCOUNTER — Encounter: Payer: Self-pay | Admitting: Internal Medicine

## 2017-12-31 ENCOUNTER — Other Ambulatory Visit: Payer: Self-pay | Admitting: Internal Medicine

## 2017-12-31 DIAGNOSIS — Z1231 Encounter for screening mammogram for malignant neoplasm of breast: Secondary | ICD-10-CM

## 2018-01-01 ENCOUNTER — Ambulatory Visit (INDEPENDENT_AMBULATORY_CARE_PROVIDER_SITE_OTHER): Payer: Medicare HMO | Admitting: Podiatry

## 2018-01-01 ENCOUNTER — Encounter: Payer: Self-pay | Admitting: Podiatry

## 2018-01-01 DIAGNOSIS — Z9889 Other specified postprocedural states: Secondary | ICD-10-CM

## 2018-01-01 NOTE — Progress Notes (Signed)
  Subjective:  Patient ID: Krista Sosa, female    DOB: 1951/02/17,  MRN: [id number]  DOS: 12/02/17 Procedure: Revision hammertoe L 5th toe  67 y.o. female returns for post-op check. Denies N/V/F/Ch. No pain today. Ambulating in normal shoes without issue.  Objective:   General AA&O x3. Normal mood and affect.  Vascular Foot warm and well perfused.  Neurologic Gross sensation intact.  Dermatologic Skin well healed with slight hyperkeratosis along incision.  Orthopedic: No tenderness to palpation noted about the surgical site.    Assessment & Plan:  Patient was evaluated and treated and all questions answered.  S/p Hammertoe revision L 5th toe -Progressing as expected post-operatively. -Sutures:out -No pain today. -Continue normal shoes.  No follow-ups on file.

## 2018-01-28 ENCOUNTER — Ambulatory Visit: Payer: Medicare HMO

## 2018-02-11 ENCOUNTER — Ambulatory Visit (INDEPENDENT_AMBULATORY_CARE_PROVIDER_SITE_OTHER): Payer: Self-pay | Admitting: Podiatry

## 2018-02-11 DIAGNOSIS — Z9889 Other specified postprocedural states: Secondary | ICD-10-CM

## 2018-02-11 NOTE — Progress Notes (Signed)
  Subjective:  Patient ID: Krista Sosa, female    DOB: 1951-03-19,  MRN: [id number]  DOS: 12/02/17 Procedure: Revision hammertoe L 5th toe  67 y.o. female returns for post-op check. Denies N/V/F/Ch. No pain today. Doing well ambulating in normal shoes without issues pleased with result.  Objective:   General AA&O x3. Normal mood and affect.  Vascular Foot warm and well perfused.  Neurologic Gross sensation intact.  Dermatologic Skin well healed with slight hyperkeratosis along incision.  Orthopedic: No tenderness to palpation noted about the surgical site.    Assessment & Plan:  Patient was evaluated and treated and all questions answered.  S/p Hammertoe revision L 5th toe -Progressing as expected post-operatively. -Skin healed. -Will discharge patient at she is doing well without post-op issues, f/u as needed.  Return if symptoms worsen or fail to improve.

## 2018-02-12 ENCOUNTER — Ambulatory Visit (INDEPENDENT_AMBULATORY_CARE_PROVIDER_SITE_OTHER): Payer: Medicare HMO | Admitting: Internal Medicine

## 2018-02-12 ENCOUNTER — Encounter: Payer: Self-pay | Admitting: Internal Medicine

## 2018-02-12 VITALS — BP 147/71 | HR 81 | Temp 98.6°F | Wt 164.6 lb

## 2018-02-12 DIAGNOSIS — J4520 Mild intermittent asthma, uncomplicated: Secondary | ICD-10-CM | POA: Diagnosis not present

## 2018-02-12 DIAGNOSIS — E663 Overweight: Secondary | ICD-10-CM | POA: Diagnosis not present

## 2018-02-12 DIAGNOSIS — Z6826 Body mass index (BMI) 26.0-26.9, adult: Secondary | ICD-10-CM

## 2018-02-12 DIAGNOSIS — J3089 Other allergic rhinitis: Secondary | ICD-10-CM | POA: Diagnosis not present

## 2018-02-12 DIAGNOSIS — Z720 Tobacco use: Secondary | ICD-10-CM | POA: Diagnosis not present

## 2018-02-12 DIAGNOSIS — I10 Essential (primary) hypertension: Secondary | ICD-10-CM | POA: Diagnosis not present

## 2018-02-12 DIAGNOSIS — Z7984 Long term (current) use of oral hypoglycemic drugs: Secondary | ICD-10-CM

## 2018-02-12 DIAGNOSIS — Z0000 Encounter for general adult medical examination without abnormal findings: Secondary | ICD-10-CM

## 2018-02-12 DIAGNOSIS — Z79899 Other long term (current) drug therapy: Secondary | ICD-10-CM

## 2018-02-12 DIAGNOSIS — R809 Proteinuria, unspecified: Secondary | ICD-10-CM

## 2018-02-12 DIAGNOSIS — J302 Other seasonal allergic rhinitis: Secondary | ICD-10-CM

## 2018-02-12 DIAGNOSIS — E1129 Type 2 diabetes mellitus with other diabetic kidney complication: Secondary | ICD-10-CM | POA: Diagnosis not present

## 2018-02-12 DIAGNOSIS — F330 Major depressive disorder, recurrent, mild: Secondary | ICD-10-CM

## 2018-02-12 HISTORY — DX: Essential (primary) hypertension: I10

## 2018-02-12 LAB — POCT GLYCOSYLATED HEMOGLOBIN (HGB A1C): Hemoglobin A1C: 9.1 % — AB (ref 4.0–5.6)

## 2018-02-12 LAB — GLUCOSE, CAPILLARY: GLUCOSE-CAPILLARY: 270 mg/dL — AB (ref 70–99)

## 2018-02-12 MED ORDER — SERTRALINE HCL 100 MG PO TABS: 200.0000 mg | ORAL_TABLET | Freq: Every day | ORAL | 3 refills | Status: DC

## 2018-02-12 MED ORDER — LISINOPRIL 40 MG PO TABS: 40.0000 mg | ORAL_TABLET | Freq: Every day | ORAL | 3 refills | Status: DC

## 2018-02-12 MED ORDER — CETIRIZINE HCL 5 MG/5ML PO SOLN: 10.0000 mg | Freq: Every day | ORAL | 1 refills | Status: DC

## 2018-02-12 NOTE — Assessment & Plan Note (Signed)
Assessment  She continues to smoke and remains in the pre-contemplative stage of cessation.  Plan  She was reminded to call us should she change her mind and wish to quit smoking as we may be able to provide her with pharmacologic aids.  We will reassess her readiness for tobacco cessation at the follow-up visit.
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
## 2018-02-12 NOTE — Assessment & Plan Note (Signed)
Assessment  Her perennial allergic rhinitis has continued to bother her.  She does not particularly care for the [facility] as she does not take it on a regular basis.  She is interested in oral antihistamine therapy.  Unfortunately, her insurance coverage for nonsedating antihistamines is quite limited.  Plan  We have started loratadine syrup 10 mg by mouth daily as needed for her perennial allergic rhinitis.  The reason for this is that is the only formulation in which her insurance will cover.  We will reassess the efficacy of this therapy in controlling her perennial allergic rhinitis symptoms at the follow-up visit.

## 2018-02-12 NOTE — Progress Notes (Signed)
   Subjective:    Patient ID: Krista Sosa, female    DOB: 11-24-1950, 67 y.o.   MRN: [id number]  HPI  Krista Sosa is here for follow-up of her type 2 diabetes with microalbuminuria, major depressive disorder, perennial allergic rhinitis, mild intermittent asthma, tobacco abuse, essential hypertension, and overweight status. Please see the A&P for the status of the pt's chronic medical problems.  Review of Systems  Constitutional: Negative for activity change, appetite change and unexpected weight change.  HENT: Positive for congestion, postnasal drip, rhinorrhea, sinus pressure and sinus pain.   Respiratory: Negative for chest tightness, shortness of breath and wheezing.   Cardiovascular: Negative for chest pain, palpitations and leg swelling.  Gastrointestinal: Negative for abdominal distention, abdominal pain, constipation, diarrhea, nausea and vomiting.  Musculoskeletal: Positive for arthralgias. Negative for joint swelling and myalgias.  Skin: Negative for rash and wound.  Psychiatric/Behavioral: Positive for dysphoric mood and sleep disturbance. The patient is nervous/anxious.       Objective:   Physical Exam  Constitutional: She is oriented to person, place, and time. She appears well-developed and well-nourished. No distress.  HENT:  Head: Normocephalic and atraumatic.  Eyes: Conjunctivae are normal. Right eye exhibits no discharge. Left eye exhibits no discharge. No scleral icterus.  Cardiovascular: Normal rate, regular rhythm and normal heart sounds. Exam reveals no gallop and no friction rub.  No murmur heard. Pulmonary/Chest: Effort normal and breath sounds normal. No stridor. No respiratory distress. She has no wheezes. She has no rales.  Musculoskeletal: Normal range of motion. She exhibits no edema, tenderness or deformity.  Neurological: She is alert and oriented to person, place, and time. She exhibits normal muscle tone.  Skin: Skin is warm and dry. No rash  noted. She is not diaphoretic. No erythema.  Psychiatric: She has a normal mood and affect. Her behavior is normal. Judgment and thought content normal.  Nursing note and vitals reviewed.     Assessment & Plan:   Please see problem based charting.

## 2018-02-12 NOTE — Assessment & Plan Note (Signed)
Assessment  Her glycemic control has worsened since the last visit approximately 5 months ago.  Her hemoglobin A1c has increased from 8.2 to 9.1.  She states she has not been active and her eating patterns have been very poor with regards to diabetes.  This has resulted in an 8 pound weight gain.  In addition, 2 of her nephews were murdered by gun violence 3 weeks ago.  Obviously she has been upset by this and has not been compliant with her diabetic medications.  She is aware of what she needs to do and would like to give lifestyle modifications and her medications a true trial as she had been doing much better prior to this tragedy.  With regards to the microalbuminuria we have room from a blood pressure standpoint to increase the ACE inhibition.  Plan  We will continue the metformin 1000 mg by mouth twice daily, glipizide XL 20 mg by mouth daily, and Januvia 100 mg by mouth daily.  In addition, she will work on portion control, increased exercise, all with the goal of losing weight once again.  We will reassess the efficacy of this regimen when taken consistently as well as her lifestyle modifications at the follow-up visit with a repeat hemoglobin A1c.  For the microalbuminuria we will increase the lisinopril from 20 mg daily to 40 mg daily.

## 2018-02-12 NOTE — Assessment & Plan Note (Signed)
Assessment  Her mild intermittent asthma has been well controlled on as needed albuterol.  This is despite the exacerbation of her perennial allergic rhinitis as well as continued smoking.  Plan  We will continue the as needed beta agonist for her mild intermittent asthma and reassess the efficacy of this therapy at the follow-up visit.

## 2018-02-12 NOTE — Assessment & Plan Note (Signed)
Assessment  She put on 8 pounds since the last clinic visit, likely related to the stress associated with the murder of her 2 nephews 3 weeks ago.  Plan  She intends to make some lifestyle modifications and order to better control her diabetes.  This includes increased exercise as well as a modification in her food choices as well as portion moderation.  She was encouraged to follow through with these changes as they will likely also impact her weight.  We will reassess the efficacy of these lifestyle changes with regards to weight loss at the follow-up visit.

## 2018-02-12 NOTE — Assessment & Plan Note (Signed)
Assessment  Not surprisingly, her depression has worsened with the recent tragedy within her family.  Surprisingly, her PHQ 9 had increased from 4 to 6.  I would have expected to see a and higher increase in her score, but it is possible the sertraline 100 mg by mouth daily had blunted this.  Nonetheless, despite the PHQ 9 of only 6, she is interested in escalating her anti-depressive dose in hopes of getting better control of her depressive symptoms.  Plan  We will increase the sertraline to 200 mg by mouth daily and reassess the efficacy of this therapy in controlling her major depression at the follow-up visit.  I also suspect that with time her grief reaction will also improve.  A PHQ 9 will be obtained at the follow-up visit to objectively look for an improvement with these changes.

## 2018-02-12 NOTE — Assessment & Plan Note (Signed)
Assessment  Her blood pressure today was elevated above target at 147/71.  This is on lisinopril 20 mg by mouth daily.  Plan  We will increase the lisinopril to 40 mg by mouth daily and reassess the blood pressure at the follow-up visit.  This should help with both the essential hypertension as well as the diabetes associated microalbuminuria.

## 2018-02-12 NOTE — Assessment & Plan Note (Signed)
She received a flu vaccination today and is still not interested in Pap smears.

## 2018-02-23 ENCOUNTER — Ambulatory Visit
Admission: RE | Admit: 2018-02-23 | Discharge: 2018-02-23 | Disposition: A | Discharge status: Home / Self Care | Payer: Medicare HMO | Source: Ambulatory Visit | Attending: Internal Medicine | Admitting: Internal Medicine

## 2018-02-23 DIAGNOSIS — Z1231 Encounter for screening mammogram for malignant neoplasm of breast: Secondary | ICD-10-CM

## 2018-04-21 ENCOUNTER — Emergency Department (HOSPITAL_COMMUNITY)
Admission: EM | Admit: 2018-04-21 | Discharge: 2018-04-21 | Disposition: A | Discharge status: Home / Self Care | Payer: Medicare HMO | Attending: Emergency Medicine | Admitting: Emergency Medicine

## 2018-04-21 ENCOUNTER — Encounter (HOSPITAL_COMMUNITY): Payer: Self-pay | Admitting: Emergency Medicine

## 2018-04-21 ENCOUNTER — Telehealth (HOSPITAL_COMMUNITY): Payer: Self-pay | Admitting: Physician Assistant

## 2018-04-21 ENCOUNTER — Emergency Department (HOSPITAL_COMMUNITY): Payer: Medicare HMO

## 2018-04-21 DIAGNOSIS — R911 Solitary pulmonary nodule: Secondary | ICD-10-CM | POA: Diagnosis not present

## 2018-04-21 DIAGNOSIS — F17210 Nicotine dependence, cigarettes, uncomplicated: Secondary | ICD-10-CM | POA: Diagnosis not present

## 2018-04-21 DIAGNOSIS — R0602 Shortness of breath: Secondary | ICD-10-CM | POA: Diagnosis not present

## 2018-04-21 DIAGNOSIS — J069 Acute upper respiratory infection, unspecified: Secondary | ICD-10-CM | POA: Diagnosis not present

## 2018-04-21 DIAGNOSIS — Z7984 Long term (current) use of oral hypoglycemic drugs: Secondary | ICD-10-CM | POA: Diagnosis not present

## 2018-04-21 DIAGNOSIS — I10 Essential (primary) hypertension: Secondary | ICD-10-CM | POA: Diagnosis not present

## 2018-04-21 DIAGNOSIS — Z7982 Long term (current) use of aspirin: Secondary | ICD-10-CM | POA: Diagnosis not present

## 2018-04-21 DIAGNOSIS — J45909 Unspecified asthma, uncomplicated: Secondary | ICD-10-CM | POA: Diagnosis not present

## 2018-04-21 DIAGNOSIS — Z79899 Other long term (current) drug therapy: Secondary | ICD-10-CM | POA: Insufficient documentation

## 2018-04-21 DIAGNOSIS — R05 Cough: Secondary | ICD-10-CM | POA: Diagnosis not present

## 2018-04-21 DIAGNOSIS — E119 Type 2 diabetes mellitus without complications: Secondary | ICD-10-CM | POA: Insufficient documentation

## 2018-04-21 MED ORDER — BENZONATATE 100 MG PO CAPS: 100.0000 mg | ORAL_CAPSULE | Freq: Three times a day (TID) | ORAL | 0 refills | Status: DC

## 2018-04-21 MED ORDER — DOXYCYCLINE HYCLATE 100 MG PO CAPS: 100.0000 mg | ORAL_CAPSULE | Freq: Two times a day (BID) | ORAL | 0 refills | Status: DC

## 2018-04-21 MED ORDER — ALBUTEROL SULFATE HFA 108 (90 BASE) MCG/ACT IN AERS: 1.0000 | INHALATION_SPRAY | Freq: Four times a day (QID) | RESPIRATORY_TRACT | 0 refills | Status: DC | PRN

## 2018-04-21 MED ORDER — IPRATROPIUM-ALBUTEROL 0.5-2.5 (3) MG/3ML IN SOLN
3.0000 mL | Freq: Once | RESPIRATORY_TRACT | Status: AC
Start: 2018-04-21 — End: 2018-04-21
  Administered 2018-04-21: 3 mL via RESPIRATORY_TRACT
  Filled 2018-04-21: qty 3

## 2018-04-21 NOTE — ED Provider Notes (Signed)
[facility] EMERGENCY DEPARTMENT Provider Note   CSN: [id number] Arrival date & time: 04/21/18  1129     History   Chief Complaint Chief Complaint  Patient presents with  . Cough    HPI Krista Sosa is a 67 y.o. female.  HPI   67 year old female presents today with 2-week history of upper respiratory infection.  Patient is symptoms started with nasal congestion rhinorrhea and productive cough.  She notes initially she felt feverish, no longer having fever.  She notes she continues to feel sick and fatigued with productive cough and minor shortness of breath.  Patient notes using over-the-counter cough medication without symptomatic improvement.  She reports she continues to smoke.  She reports she received the influenza vaccine this year.  She has a history of asthma and does have albuterol at home which she has been using. Past Medical History:  Diagnosis Date  . Allergy   . Asthma, chronic 06/09/2006  . Bilateral cataracts 05/07/2016   s/p left cataract extraction 10/02/2016 and right cataract extraction 10/16/2016  . Cataract    bil eyes  . Essential hypertension 02/12/2018  . Gastroesophageal reflux disease 08/13/2011  . Glaucoma    Per patient report  . Hx of adenomatous colonic polyps 11/16/2008  . Hypertension    pt said she is supposed to take med but she is not taking it.  . Hypertensive retinopathy, grade 2, bilateral 05/07/2016  . Latent tuberculosis    dx 03/17/08 - tx with INH X 9 months  . Lichenoid dermatitis 02/13/2012   Previously treated with Triamcinolone 0.025% cream Q8-12H PRN   . Osteoarthritis, generalized 05/21/2006   Diffuse: cervical spine, right shoulder, left wrist Ineffective medications: Tylenol, ASA, Naprosyn, Ibuprofen, Meloxicam    . Overweight (BMI 25.0-29.9) 02/13/2012  . Perennial allergic rhinitis with seasonal variation 06/09/2006  . Persistent microalbuminuria associated with type II diabetes mellitus ([place]) 05/21/2006   . Sickle cell anemia (HCC)    pt has sickle cell trait per pt  . Sickle cell trait ([place]) 02/13/2012  . Tobacco abuse disorder 06/09/2006  . Tubular adenoma of rectum 11/16/2008   5 mm, excised endoscopically 11/16/2008, repeat colonoscopy due July 2015   . Ulnar neuropathy of right upper extremity 02/14/2016  . Urinary tract disease    JICP    Patient Active Problem List   Diagnosis Date Noted  . Essential hypertension 02/12/2018  . Major depressive disorder, recurrent episode, mild with anxious distress ([place]) 06/05/2017  . Hypertensive retinopathy, grade 2, bilateral 05/07/2016  . Healthcare maintenance 09/03/2012  . Lichenoid dermatitis 02/13/2012  . Sickle cell trait ([place]) 02/13/2012  . Overweight (BMI 25.0-29.9) 02/13/2012  . Gastroesophageal reflux disease without esophagitis 08/13/2011  . Hx of adenomatous colonic polyps 11/16/2008  . Tobacco abuse disorder 06/09/2006  . Perennial allergic rhinitis with seasonal variation 06/09/2006  . Mild intermittent asthma 06/09/2006  . Type 2 diabetes mellitus with microalbuminuria, without long-term current use of insulin ([place]) 05/21/2006  . Osteoarthritis, generalized 05/21/2006    Past Surgical History:  Procedure Laterality Date  . APPENDECTOMY    . CATARACT EXTRACTION, BILATERAL Bilateral    OS 10/10/2016, OD 10/16/2016  . COLONOSCOPY  11/16/08  . FOOT SURGERY  07/31/2008   Exostectomy right hallus  . TUBAL LIGATION       OB History   None      Home Medications    Prior to Admission medications   Medication Sig Start Date End Date Taking? Authorizing Provider  albuterol (PROVENTIL HFA;VENTOLIN HFA) 108 (90 Base) MCG/ACT inhaler Inhale 1-2 puffs into the lungs every 6 (six) hours as needed for wheezing or shortness of breath. 04/21/18   Tanette Chauca, Dellis Filbert, PA-C  albuterol (VENTOLIN HFA) 108 (90 Base) MCG/ACT inhaler Inhale 1-2 puffs into the lungs every 6 (six) hours as needed for shortness of breath. 10/09/17   Oval Linsey, MD  aspirin EC 81 MG tablet Take 1 tablet (81 mg total) by mouth daily. Patient not taking: Reported on 10/07/2017 01/29/17 01/29/18  Oval Linsey, MD  benzonatate (TESSALON) 100 MG capsule Take 1 capsule (100 mg total) by mouth every 8 (eight) hours. 04/21/18   Carmencita Cusic, Dellis Filbert, PA-C  Blood Glucose Monitoring Suppl (TRUE METRIX AIR GLUCOSE METER) w/Device KIT 1 Device by Does not apply route daily AND 1 strip daily. Use to test blood glucose 1-2 times daily. Dx: E11.29. 10/09/17   Oval Linsey, MD  cetirizine HCl (ZYRTEC) 5 MG/5ML SOLN Take 10 mLs (10 mg total) by mouth daily. 02/12/18   Oval Linsey, MD  doxycycline (VIBRAMYCIN) 100 MG capsule Take 1 capsule (100 mg total) by mouth 2 (two) times daily. 04/21/18   Renu Asby, Dellis Filbert, PA-C  glipiZIDE (GLUCOTROL XL) 10 MG 24 hr tablet Take 2 tablets (20 mg total) by mouth daily with breakfast. 10/09/17   Oval Linsey, MD  glucose blood (TRUE METRIX BLOOD GLUCOSE TEST) test strip Use to test blood glucose 1-2 times daily. Dx: E11.29 10/09/17   Oval Linsey, MD  ibuprofen (ADVIL,MOTRIN) 800 MG tablet Take 1 tablet (800 mg total) by mouth every 8 (eight) hours as needed for moderate pain. 10/09/17   Oval Linsey, MD  lisinopril (PRINIVIL,ZESTRIL) 40 MG tablet Take 1 tablet (40 mg total) by mouth daily. 02/12/18   Oval Linsey, MD  metFORMIN (GLUCOPHAGE) 1000 MG tablet Take 1 tablet (1,000 mg total) by mouth 2 (two) times daily with a meal. 06/01/17   Oval Linsey, MD  sertraline (ZOLOFT) 100 MG tablet Take 2 tablets (200 mg total) by mouth daily. 02/12/18   Oval Linsey, MD  simvastatin (ZOCOR) 20 MG tablet Take 1 tablet (20 mg total) by mouth every evening. 10/09/17   Oval Linsey, MD  sitaGLIPtin (JANUVIA) 100 MG tablet Take 1 tablet (100 mg total) by mouth daily. 10/09/17   Oval Linsey, MD  traZODone (DESYREL) 100 MG tablet Take 1 tablet (100 mg total) by mouth at bedtime as needed for sleep. 10/09/17   Oval Linsey, MD    triamcinolone (KENALOG) 0.025 % ointment APPLY TOPICALLY TWICE DAILY AS DIRECTED 10/09/17   Oval Linsey, MD  triamcinolone (NASACORT ALLERGY 24HR) 55 MCG/ACT AERO nasal inhaler Place 2 sprays into the nose daily as needed.    [provider]  TRUEPLUS LANCETS 33G MISC Use to test blood glucose 1-2 daily. E11.29 10/09/17   Oval Linsey, MD  vitamin B-12 (CYANOCOBALAMIN) 1000 MCG tablet Take 1 tablet (1,000 mcg total) by mouth daily. 10/09/17   Oval Linsey, MD  Triamcinolone Acetonide (TRIAMCINOLONE 0.1 % CREAM : EUCERIN) CREA Apply 1 application topically 2 (two) times daily as needed. 01/13/13 04/18/13  Oval Linsey, MD    Family History Family History  Problem Relation Age of Onset  . Diabetes Mother   . Heart disease Mother 83  . Colon cancer Mother   . Cancer Father   . Sickle cell anemia Sister   . Sickle cell anemia Brother   . Diabetes Sister   . Brain cancer Sister   . Lung cancer Brother   .  Healthy Sister   . Healthy Sister   . Healthy Sister   . Healthy Daughter   . Healthy Son   . Healthy Daughter   . Healthy Daughter   . Healthy Son   . Wilson's disease Maternal Uncle   . Breast cancer Maternal Aunt   . Breast cancer Other   . Esophageal cancer Neg Hx   . Rectal cancer Neg Hx   . Stomach cancer Neg Hx     Social History Social History   Tobacco Use  . Smoking status: Current Every Day Smoker    Packs/day: 0.50    Years: 25.00    Pack years: 12.50    Types: Cigarettes  . Smokeless tobacco: Never Used  Substance Use Topics  . Alcohol use: Yes    Alcohol/week: 0.0 standard drinks    Comment: occasionally  . Drug use: No    Comment: positive marijuana metabolite in 2010     Allergies   Patient has no known allergies.   Review of Systems Review of Systems   Physical Exam Updated Vital Signs BP (!) 147/73   Pulse (!) 108   Temp 99.9 F (37.7 C) (Oral)   Resp 20   Ht _0  (1.676 m)   Wt 65.8 kg   LMP 08/27/2007   SpO2  97%   BMI 23.40 kg/m   Physical Exam Physical Exam  Constitutional: She is oriented to person, place, and time. She appears well-developed and well-nourished.  HENT:  Head: Normocephalic and atraumatic.  Cough, rhinorrhea, oropharynx clear no swelling or edema, bilateral TMs normal  Eyes: Pupils are equal, round, and reactive to light. Conjunctivae are normal. Right eye exhibits no discharge. Left eye exhibits no discharge. No scleral icterus.  Neck: Normal range of motion. No JVD present. No tracheal deviation present.  Cardiovascular: Regular rhythm and normal heart sounds.  Pulmonary/Chest: Effort normal. No stridor.  Left upper expiratory wheeze, no respiratory distress  Neurological: She is alert and oriented to person, place, and time. Coordination normal.  Psychiatric: She has a normal mood and affect. Her behavior is normal. Judgment and thought content normal.  Nursing note and vitals reviewed.  ED Treatments / Results  Labs (all labs ordered are listed, but only abnormal results are displayed) Labs Reviewed - No data to display  EKG None  Radiology Dg Chest 2 View  Result Date: 04/21/2018 CLINICAL DATA:  Cough with headaches and shortness of breath for 2 weeks. History of diabetes and asthma. EXAM: CHEST - 2 VIEW COMPARISON:  Radiographs 07/13/2008.  Chest CT 09/20/2008. FINDINGS: The heart size and mediastinal contours are stable. Today's frontal examination demonstrates increased density projecting over the left suprahilar region, at the confluence of the 1st and 6th ribs. There is no clear corresponding abnormality on the lateral view. However, a pulmonary nodule cannot be excluded. The lungs are otherwise clear. There is no pleural effusion or pneumothorax. There are mild degenerative changes in the spine associated with a convex right thoracic scoliosis. IMPRESSION: Increased density in the left suprahilar compared with prior studies, potentially osseous overlap,  although cannot exclude a pulmonary nodule. Further evaluation with chest CT recommended. Solely to evaluate this finding, intravenous contrast is not necessary. Electronically Signed   By: Richardean Sale M.D.   On: 04/21/2018 12:44    Procedures Procedures (including critical care time)  Medications Ordered in ED Medications  ipratropium-albuterol (DUONEB) 0.5-2.5 (3) MG/3ML nebulizer solution 3 mL (3 mLs Nebulization Given 04/21/18 1242)  Initial Impression / Assessment and Plan / ED Course  I have reviewed the triage vital signs and the nursing notes.  Pertinent labs & imaging results that were available during my care of the patient were reviewed by me and considered in my medical decision making (see chart for details).    67 year old female presents today with 2 weeks of productive cough and fatigue.  Given duration of symptoms I do feel antibiotics would be appropriate in this patient.  She received a breathing treatment which improved her respiratory status, no signs of respiratory distress.  She is stable for outpatient follow-up.  Her chest x-ray was significant for pulmonary nodule patient will follow up with the primary care provider for outpatient imaging.  Should return immediately if she develops any new or worsening signs or symptoms.  She verbalized understanding and agreement to today's plan and no further questions or concerns.    Final Clinical Impressions(s) / ED Diagnoses   Final diagnoses:  Pulmonary nodule  Upper respiratory tract infection, unspecified type    ED Discharge Orders         Ordered    doxycycline (VIBRAMYCIN) 100 MG capsule  2 times daily     04/21/18 1310    albuterol (PROVENTIL HFA;VENTOLIN HFA) 108 (90 Base) MCG/ACT inhaler  Every 6 hours PRN     04/21/18 1310    benzonatate (TESSALON) 100 MG capsule  Every 8 hours,   Status:  Discontinued     04/21/18 1310           Kasyn Rolph, Dellis Filbert, PA-C 04/21/18 [place], [place],  DO 04/21/18 1641

## 2018-04-21 NOTE — ED Triage Notes (Signed)
Pt states she has had a cough for several weeks. Occasionally coughing up white sputum. Fevers off and on. Denies nasal congestion.

## 2018-04-21 NOTE — Discharge Instructions (Addendum)
Please read attached information. If you experience any new or worsening signs or symptoms please return to the emergency room for evaluation. Please follow-up with your primary care provider or specialist as discussed.  Please inform them of your pulmonary nodule and need for CT scan.  Please use medication prescribed only as directed and discontinue taking if you have any concerning signs or symptoms.

## 2018-04-26 ENCOUNTER — Encounter: Payer: Self-pay | Admitting: Internal Medicine

## 2018-04-26 ENCOUNTER — Other Ambulatory Visit: Payer: Self-pay | Admitting: Internal Medicine

## 2018-04-26 DIAGNOSIS — R911 Solitary pulmonary nodule: Secondary | ICD-10-CM | POA: Insufficient documentation

## 2018-05-04 ENCOUNTER — Telehealth: Payer: Self-pay

## 2018-05-04 NOTE — Telephone Encounter (Signed)
Requesting to speak with a nurse about not feeling well. Please call pt back.  

## 2018-05-04 NOTE — Telephone Encounter (Signed)
Agree with [facility] appointment given she feels no better.  CT scan of the chest was ordered to evaluate a pulmonary nodule seen on CXR in this women with a smoking history.  I would appreciate it if this could be completed before my scheduled continuity visit so that we may discuss the results and next steps if necessary.  Thank You.

## 2018-05-04 NOTE — Telephone Encounter (Signed)
Pt calls and states she feels no better from her visit to ED 11/27. She states that dr Eppie Gibson called her and said he wanted her in "for something before my appointment on 12/20 but I cant remember what he said" triage viewed order for CT/chest order by dr Eppie Gibson and will send this note to dr Eppie Gibson and chilonb. Pt desires to be seen asap, [facility] 12/11 at 1500 (her choosing of time and date). She also states she took all abx as directed.

## 2018-05-05 ENCOUNTER — Encounter: Payer: Self-pay | Admitting: Internal Medicine

## 2018-05-05 ENCOUNTER — Other Ambulatory Visit: Payer: Self-pay

## 2018-05-05 ENCOUNTER — Ambulatory Visit (INDEPENDENT_AMBULATORY_CARE_PROVIDER_SITE_OTHER): Payer: Medicare HMO | Admitting: Internal Medicine

## 2018-05-05 VITALS — BP 140/69 | HR 76 | Temp 97.8°F | Ht 66.0 in | Wt 160.5 lb

## 2018-05-05 DIAGNOSIS — Z79899 Other long term (current) drug therapy: Secondary | ICD-10-CM | POA: Diagnosis not present

## 2018-05-05 DIAGNOSIS — J45909 Unspecified asthma, uncomplicated: Secondary | ICD-10-CM | POA: Diagnosis not present

## 2018-05-05 DIAGNOSIS — K219 Gastro-esophageal reflux disease without esophagitis: Secondary | ICD-10-CM

## 2018-05-05 DIAGNOSIS — R05 Cough: Secondary | ICD-10-CM

## 2018-05-05 DIAGNOSIS — R918 Other nonspecific abnormal finding of lung field: Secondary | ICD-10-CM

## 2018-05-05 DIAGNOSIS — I10 Essential (primary) hypertension: Secondary | ICD-10-CM

## 2018-05-05 DIAGNOSIS — R053 Chronic cough: Secondary | ICD-10-CM | POA: Insufficient documentation

## 2018-05-05 MED ORDER — LOSARTAN POTASSIUM 50 MG PO TABS: 50.0000 mg | ORAL_TABLET | Freq: Every day | ORAL | 1 refills | Status: DC

## 2018-05-05 MED ORDER — PROMETHAZINE-CODEINE 6.25-10 MG/5ML PO SOLN: 5.0000 mL | Freq: Every day | ORAL | 0 refills | Status: DC

## 2018-05-05 NOTE — Telephone Encounter (Signed)
Ct is 12/17

## 2018-05-05 NOTE — Progress Notes (Signed)
   CC: cough  HPI:  [name] is a 67 y.o. female who presented to the clinic for evaluation and management of her chronic cough. For a detailed assessment and plan please refer to problem based charting below.   Past Medical History:  Diagnosis Date  . Allergy   . Asthma, chronic 06/09/2006  . Bilateral cataracts 05/07/2016   s/p left cataract extraction 10/02/2016 and right cataract extraction 10/16/2016  . Cataract    bil eyes  . Essential hypertension 02/12/2018  . Gastroesophageal reflux disease 08/13/2011  . Glaucoma    Per patient report  . Hx of adenomatous colonic polyps 11/16/2008  . Hypertension    pt said she is supposed to take med but she is not taking it.  . Hypertensive retinopathy, grade 2, bilateral 05/07/2016  . Latent tuberculosis    dx 03/17/08 - tx with INH X 9 months  . Lichenoid dermatitis 02/13/2012   Previously treated with Triamcinolone 0.025% cream Q8-12H PRN   . Osteoarthritis, generalized 05/21/2006   Diffuse: cervical spine, right shoulder, left wrist Ineffective medications: Tylenol, ASA, Naprosyn, Ibuprofen, Meloxicam    . Overweight (BMI 25.0-29.9) 02/13/2012  . Perennial allergic rhinitis with seasonal variation 06/09/2006  . Persistent microalbuminuria associated with type II diabetes mellitus (Krista Sosa) 05/21/2006  . Sickle cell anemia (HCC)    pt has sickle cell trait per pt  . Sickle cell trait (Krista Sosa) 02/13/2012  . Tobacco abuse disorder 06/09/2006  . Tubular adenoma of rectum 11/16/2008   5 mm, excised endoscopically 11/16/2008, repeat colonoscopy due July 2015   . Ulnar neuropathy of right upper extremity 02/14/2016  . Urinary tract disease    JICP   Review of Systems:  12 point ROS preformed. All negative aside from those mentioned in the HPI.  Physical Exam: Vitals:   05/05/18 1446  BP: 140/69  Pulse: 76  Temp: 97.8 F (36.6 C)  TempSrc: Oral  SpO2: 100%  Weight: 160 lb 8 oz (72.8 kg)  Height: 5\' 6"  (1.676 m)   General: Well  nourished female in no acute distress HENT: Normocephalic, atraumatic, moist mucus membranes, TM clear without erythema, nares are erythematous without discharge, oropharynx clear without exudates, no LAD, ?nontender thyromegaly Pulm: Limited by coughing but no apparent wheezing, rhonchi, crackles   CV: RRR, no murmurs, no rubs, no JVD  Abdomen: Active bowel sounds, soft, non-distended, no tenderness to palpation  Extremities: Pulses palpable in all extremities, no LE edema  Skin: Warm and dry  Neuro: Alert and oriented x 3  Assessment & Plan:   See Encounters Tab for problem based charting.  Patient discussed with Dr. Lynnae January

## 2018-05-05 NOTE — Patient Instructions (Signed)
Thank you for allowing [place] to provide your care. Today we are switching your blood pressure medication in case that is contributing to her cough. I have also sent out some cough syrup. Please take this at night as it can make you drowsy. Please follow-up failure chest CT so we can ensure that the spot on your lungs is not causing your cough.

## 2018-05-05 NOTE — Assessment & Plan Note (Addendum)
Patient's blood pressure is borderline today at 140/69. She is on lisinopril 40 mg once daily. She has a chronic cough so we will therefore switch from lisinopril to losartan 50 mg once daily. Her last BMP was over one year ago follow-up visit I would recommend checking him BMP to ensure she has stable renal function and potassium.

## 2018-05-05 NOTE — Assessment & Plan Note (Addendum)
Patient presents with cough of reported four weeks duration. States that it occurred after she got her flu shot. Initially was dry; however, is now productive of yellow sputum. It is worse in the morning. Endorses bilateral ear pressure and previous fever/chills that have since resolved. She denies any rhinorrhea, sinus congestion, changes in bowel habits, changes in urinary habits, rash, or other systemic symptoms. She has tried multiple over-the-counter medications including Mucinex and other cough suppressants without relief. She uses her albuterol inhaler at least once daily and does not feel that this provides any relief. She was evaluated the emergency department on 11/27 chest x-ray illustrated left upper lobe nodule. He was otherwise stable for discharge home with doxycycline and tessalon. She completed both courses of medication and states that she had no improvement in symptoms.  On physical exam, pulmonary auscultation was limited by coughing with deep inspiration; however, she did not appear to have any inspiratory or expiratory wheezing, crackles, or rhonchi. Cardiovascular exam was unremarkable with regular rate and rhythm. She had no JVD or lower extremity edema. She had no abdominal distention or tenderness to palpation.  Review of patient's records indicates that she actually received the influenza vaccine back in September so there's questions as to the duration of her actual symptoms. If this has been going on since she received the influenza vaccine it would appear that this is been going on for over three months and therefore classify as a chronic cough. She received a chest x-ray on 11/27 that illustrated a questionable left upper lobe nodule and her primary care provider has already ordered a CT of the chest to further evaluate this lesion. She already has a known history of asthma; however, I do not suspect an acute exacerbation and therefore do not feel she would benefit from steroids.  She also has a known history of GERD and is not on acid suppression. I do not see any signs or symptoms of heart failure to make me think that this cough is cardiogenic.  Initial thoughts were that this was a post viral bronchitis however given the discrepancy the duration of symptoms we will approach this more as a chronic cough. I have switched her lisinopril to losartan even though her symptoms are not classic for an ACE inhibitor induced cough. She will follow-up on 12/17 for a CT of her chest. I have prescribed her some cough syrup while we pursue further workup. If the change in her ace inhibitor and the CT of her chest are unrevealing I would recommend starting acid suppression medication and continuing workup for chronic cough.

## 2018-05-06 ENCOUNTER — Other Ambulatory Visit: Payer: Self-pay | Admitting: *Deleted

## 2018-05-06 DIAGNOSIS — R05 Cough: Secondary | ICD-10-CM

## 2018-05-06 DIAGNOSIS — R053 Chronic cough: Secondary | ICD-10-CM

## 2018-05-06 MED ORDER — PROMETHAZINE-CODEINE 6.25-10 MG/5ML PO SOLN: 5.0000 mL | Freq: Every day | ORAL | 0 refills | Status: DC

## 2018-05-06 MED FILL — PROMETHAZINE W/COD SYRUP: 6.25-10 | 23 days supply | Qty: 118 | Fill #0

## 2018-05-06 NOTE — Telephone Encounter (Signed)
Dr Tarri Abernethy, [facility] does not have the promethazine/ codeine syrup in stock, can you send a new script to [facility]?

## 2018-05-06 NOTE — Progress Notes (Signed)
Internal Medicine Clinic Attending  Case discussed with Dr. Helberg at the time of the visit.  We reviewed the resident's history and exam and pertinent patient test results.  I agree with the assessment, diagnosis, and plan of care documented in the resident's note.    

## 2018-05-11 ENCOUNTER — Ambulatory Visit (HOSPITAL_COMMUNITY)
Admission: RE | Admit: 2018-05-11 | Discharge: 2018-05-11 | Disposition: A | Discharge status: Home / Self Care | Payer: Medicare HMO | Source: Ambulatory Visit | Attending: Internal Medicine | Admitting: Internal Medicine

## 2018-05-11 DIAGNOSIS — R911 Solitary pulmonary nodule: Secondary | ICD-10-CM | POA: Insufficient documentation

## 2018-05-11 DIAGNOSIS — J439 Emphysema, unspecified: Secondary | ICD-10-CM | POA: Diagnosis not present

## 2018-05-13 ENCOUNTER — Encounter: Payer: Self-pay | Admitting: Internal Medicine

## 2018-05-13 DIAGNOSIS — J479 Bronchiectasis, uncomplicated: Secondary | ICD-10-CM | POA: Insufficient documentation

## 2018-05-13 HISTORY — DX: Bronchiectasis, uncomplicated: J47.9

## 2018-05-14 ENCOUNTER — Encounter: Payer: Self-pay | Admitting: Internal Medicine

## 2018-05-14 ENCOUNTER — Ambulatory Visit (INDEPENDENT_AMBULATORY_CARE_PROVIDER_SITE_OTHER): Payer: Medicare HMO | Admitting: Internal Medicine

## 2018-05-14 VITALS — BP 142/60 | HR 84 | Temp 97.9°F | Wt 159.3 lb

## 2018-05-14 DIAGNOSIS — J3089 Other allergic rhinitis: Secondary | ICD-10-CM

## 2018-05-14 DIAGNOSIS — F330 Major depressive disorder, recurrent, mild: Secondary | ICD-10-CM

## 2018-05-14 DIAGNOSIS — J4521 Mild intermittent asthma with (acute) exacerbation: Secondary | ICD-10-CM

## 2018-05-14 DIAGNOSIS — R05 Cough: Secondary | ICD-10-CM

## 2018-05-14 DIAGNOSIS — Z720 Tobacco use: Secondary | ICD-10-CM

## 2018-05-14 DIAGNOSIS — E538 Deficiency of other specified B group vitamins: Secondary | ICD-10-CM | POA: Diagnosis not present

## 2018-05-14 DIAGNOSIS — Z7984 Long term (current) use of oral hypoglycemic drugs: Secondary | ICD-10-CM

## 2018-05-14 DIAGNOSIS — E539 Vitamin B deficiency, unspecified: Secondary | ICD-10-CM | POA: Diagnosis not present

## 2018-05-14 DIAGNOSIS — J302 Other seasonal allergic rhinitis: Secondary | ICD-10-CM

## 2018-05-14 DIAGNOSIS — K219 Gastro-esophageal reflux disease without esophagitis: Secondary | ICD-10-CM

## 2018-05-14 DIAGNOSIS — J479 Bronchiectasis, uncomplicated: Secondary | ICD-10-CM | POA: Diagnosis not present

## 2018-05-14 DIAGNOSIS — E1129 Type 2 diabetes mellitus with other diabetic kidney complication: Secondary | ICD-10-CM

## 2018-05-14 DIAGNOSIS — J4520 Mild intermittent asthma, uncomplicated: Secondary | ICD-10-CM

## 2018-05-14 DIAGNOSIS — R809 Proteinuria, unspecified: Secondary | ICD-10-CM | POA: Diagnosis not present

## 2018-05-14 DIAGNOSIS — Z0000 Encounter for general adult medical examination without abnormal findings: Secondary | ICD-10-CM

## 2018-05-14 DIAGNOSIS — R053 Chronic cough: Secondary | ICD-10-CM

## 2018-05-14 DIAGNOSIS — Z77011 Contact with and (suspected) exposure to lead: Secondary | ICD-10-CM | POA: Diagnosis not present

## 2018-05-14 DIAGNOSIS — I10 Essential (primary) hypertension: Secondary | ICD-10-CM | POA: Diagnosis not present

## 2018-05-14 DIAGNOSIS — Z79899 Other long term (current) drug therapy: Secondary | ICD-10-CM

## 2018-05-14 DIAGNOSIS — F17201 Nicotine dependence, unspecified, in remission: Secondary | ICD-10-CM

## 2018-05-14 LAB — GLUCOSE, CAPILLARY: GLUCOSE-CAPILLARY: 361 mg/dL — AB (ref 70–99)

## 2018-05-14 LAB — POCT GLYCOSYLATED HEMOGLOBIN (HGB A1C): Hemoglobin A1C: 10.6 % — AB (ref 4.0–5.6)

## 2018-05-14 MED ORDER — CHLORPHENIRAMINE MALEATE 4 MG PO TABS: 4.0000 mg | ORAL_TABLET | Freq: Two times a day (BID) | ORAL | 0 refills | Status: DC | PRN

## 2018-05-14 MED ORDER — FLUTICASONE-SALMETEROL 250-50 MCG/DOSE IN AEPB: 1.0000 | INHALATION_SPRAY | Freq: Two times a day (BID) | RESPIRATORY_TRACT | 11 refills | Status: DC

## 2018-05-14 MED ORDER — CIPROFLOXACIN HCL 750 MG PO TABS: 750.0000 mg | ORAL_TABLET | Freq: Two times a day (BID) | ORAL | 0 refills | Status: DC

## 2018-05-14 MED ORDER — OMEPRAZOLE 40 MG PO CPDR: 40.0000 mg | DELAYED_RELEASE_CAPSULE | Freq: Every day | ORAL | 3 refills | Status: DC

## 2018-05-14 NOTE — Patient Instructions (Addendum)
I am sorry you are feeling so poorly.  I think it is likely a combination of many things.  1) Great job with quitting smoking.  This is the best thing you could do for your health, lungs, and cough.  2) I started Advair 250-50 1 puff twice a day for your asthma.  3) I started chlorpheniramine 4 mg by mouth twice a day as needed for congestion and cough.  Take this twice a day for at least 1 week than as needed.  It will make you sleepy but will take care of the stuffiness.  4) I started ciprofloxacin 750 mg by mouth twice daily for 1 week.  This is for the bronchiectasis (infection of the bronchial tubes that we talked about).  5) I started omeprazole 40 mg daily for your acid reflux.  This is likely playing a role for your cough.  6) We check some blood work today including a lead level given your exposure.  7) Keep working on taking your diabetes medicine.  I know if has been hard lately, but if we can make you feel better I hope it will be easier to take care of your diabetes.

## 2018-05-14 NOTE — Assessment & Plan Note (Signed)
Assessment  On the recent CT scan of the thorax which ruled out a left upper lobe nodule she was found to have left lower lobe bronchiectasis that was felt to be mild.  She has had a persistent productive cough and has several potential etiologies including her upper airway cough syndrome with postnasal drip.  That said, she also may be experiencing an acute exacerbation of her bronchiectasis.  It is unlikely that the prior course of doxycycline would adequately address the colonization that occurs in bronchiectatic segments of the lung.  Plan  She was given a one-week course of ciprofloxacin 750 mg by mouth twice daily to treat her underlying bronchiectasis which may be exacerbated and contributing to her symptoms.  We will reassess her sputum volume and cough at the follow-up visit.

## 2018-05-14 NOTE — Assessment & Plan Note (Signed)
Assessment  Although recently her asthma has been reasonably well controlled symptomatically on as needed albuterol the worsening in her cough may reflect a worsening in her underlying asthma.  Clearly, it is being exacerbated by the postnasal drip and this is being addressed as outlined elsewhere.  It is also helpful that she has quit smoking, albeit because she is not feeling well, not because she had contemplated this was the best thing for her.  That said it can only help her underlying asthma.  With the recent symptoms of gastroesophageal reflux disease I suspect this too may be playing a role in her chronic cough as well as a possible asthma exacerbation.  Taking all of these things into consideration it is not unreasonable to restart an inhaled steroid with long-acting beta agonist.  This may be especially true if the reports of mold are true.  Plan  We have started Advair 250-50 mcg 1 puff twice daily we are continuing the as needed albuterol.  As noted elsewhere we are aggressively addressing the allergic rhinitis with postnasal drip as well as the gastroesophageal reflux disease as these may be exacerbating her asthma.

## 2018-05-14 NOTE — Assessment & Plan Note (Signed)
Krista Sosa remains uninterested in Pap smears at this point.  I will discuss the issue of a DEXA scan screen for osteoporosis at the follow-up visit.  She is otherwise up-to-date on healthcare maintenance.
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
## 2018-05-14 NOTE — Assessment & Plan Note (Signed)
Assessment  I believe she is having asymptomatic gastroesophageal reflux disease that may be manifesting as a cough rather than burning.  This is especially true after her recent symptoms of heartburn.  Plan  She was started on omeprazole 40 mg by mouth daily and this will be continued for at least 3 months to assess whether or not her gastroesophageal reflux disease may be playing a role in her chronic cough as well as in her asthma exacerbation.  We will reassess the efficacy of this PPI therapy at the follow-up visit with regards to both the acid reflux heartburn symptoms, cough, and asthma control.

## 2018-05-14 NOTE — Assessment & Plan Note (Signed)
Assessment  Her blood pressure today was elevated at 142/60.  This is on losartan 50 mg by mouth daily.  Plan  We have decided not to escalate her antihypertensive regimen given the mild elevation in her blood pressure in the setting of an acute illness.  If her cough persists despite all of the changes that we made today we will have to discontinue the losartan to be assured this is not contributing to her chronic cough.  We will reassess the blood pressure at the follow-up visit.

## 2018-05-14 NOTE — Assessment & Plan Note (Signed)
Assessment  I had inadvertently thought that she had a vitamin B12 deficiency although I saw after the visit this has never been confirmed.  Plan  Because of this inadvertent believe she had a vitamin B12 deficiency I ordered a vitamin B12 level to see if she was replete.  This has been drawn and is pending at the time of this dictation.
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
## 2018-05-14 NOTE — Assessment & Plan Note (Signed)
Assessment  Given the letter she received from the housing department about exposure to lead within the complex she was concerned about lead poisoning and requested a lead level.  Plan  A lead level was drawn today and is pending at this time.  We will follow-up once resulted.

## 2018-05-14 NOTE — Assessment & Plan Note (Signed)
Assessment  Her hemoglobin A1c is 10.6 up from 9.1 three months ago.  This suggests even worse glycemic control than previously.  Her current regimen includes Glucotrol XL 20 mg by mouth daily, metformin 1000 mg by mouth twice daily, and Januvia 100 mg by mouth daily.  She admits that since she has not been feeling well with regards to her cough and congestion she has not been consistently taking her Glucotrol XL, metformin, or Januvia.  She has been taking her ARB for her microalbuminuria.  Plan  The importance of compliance with her oral glycemic regimen was discussed.  She is fully aware of this and will try to be much better with regards to both her medication and diet.  We will therefore continue the Glucotrol XL 20 mg by mouth daily, metformin 1000 mg by mouth twice daily, and Januvia 100 mg by mouth daily.  We will also continue the losartan for her microalbuminuria.  A basic metabolic panel was obtained today and is pending at the time of this dictation.  We will reassess glycemic control with what I hope his improved compliance at the follow-up visit with a hemoglobin A1c.

## 2018-05-14 NOTE — Assessment & Plan Note (Signed)
Assessment  Since she is had her cough for the last 3 months she has not been as interested in smoking.  Although she has had some nicotine withdrawal she has been able to avoid smoking altogether.  Serendipitously, this appears to be one positive outcome of her chronic cough.  Plan  She was praised for her success at smoking cessation and was encouraged to remain tobacco free given her underlying pulmonary and cardiovascular problems.  She was not interested in nicotine replacement therapy as she is felt that has not worked before.  We will reassess her success in remaining tobacco free at the follow-up visit.

## 2018-05-14 NOTE — Assessment & Plan Note (Signed)
Assessment  Her perennial allergic rhinitis is currently acting up as she feels congested and has the sensation of a postnasal drip.  The second-generation antihistamine syrup was ineffective at managing her symptoms.  At this point, I believe very strongly that part of her chronic cough is related to an upper airway cough syndrome from a postnasal drip.  Given that she is tiring of this chronic cough she may benefit from an antihistamine that also has anticholinergic properties.  Plan  We started chlorpheniramine 4 mg by mouth twice daily for 1 week and then every 12 hours as needed thereafter.  It is hoped that this therapy will include drying of her congestion and relief of her postnasal drip likely resulting in a chronic cough.  If she has a component of asthma as a cause of the chronic cough this may be improved by managing the postnasal drip with the chlorpheniramine.  We will reassess her symptoms of allergic rhinitis and likely postnasal drip at the follow-up visit.

## 2018-05-14 NOTE — Assessment & Plan Note (Signed)
Assessment  She has had a cough for the last 3 months and by definition has a chronic cough.  Her asthma has been inadequately controlled as she has not been on an inhaled steroid.  This was begun today.  She continues to smoke and this too can be contributing to a chronic cough.  Given she has been feeling poorly lately and has stopped smoking this should improve over the next several weeks if it is related to her smoking.  She had symptomatic heartburn related to her gastroesophageal reflux disease and this requires therapy whether or not it may also be exacerbating her chronic cough.  Finally, she clearly has nasal congestion with a postnasal drip and this would result in an upper airway cough syndrome.  This has been addressed with a first generation antihistamine to dry up the congestion.  Therefore, each of the potential causes of the chronic cough have required individual intervention for their underlying etiologies and thus the chronic cough may resolve as we address these underlying potentially causative agents.  Plan  We will see if the interventions related to each of the possible components of the chronic cough, which required being addressed given her symptoms, end up resulting in a resolution of her chronic cough.

## 2018-05-17 LAB — LEAD, BLOOD (ADULT >= 16 YRS): Lead-Whole Blood: NOT DETECTED ug/dL (ref 0–4)

## 2018-05-17 LAB — BMP8+ANION GAP
Anion Gap: 15 mmol/L (ref 10.0–18.0)
BUN / CREAT RATIO: 23 (ref 12–28)
BUN: 12 mg/dL (ref 8–27)
CHLORIDE: 98 mmol/L (ref 96–106)
CO2: 21 mmol/L (ref 20–29)
Calcium: 9.5 mg/dL (ref 8.7–10.3)
Creatinine, Ser: 0.52 mg/dL — ABNORMAL LOW (ref 0.57–1.00)
GFR calc Af Amer: 116 mL/min/{1.73_m2} (ref >59)
GFR calc non Af Amer: 101 mL/min/{1.73_m2} (ref >59)
GLUCOSE: 338 mg/dL — AB (ref 65–99)
Potassium: 4.5 mmol/L (ref 3.5–5.2)
SODIUM: 134 mmol/L (ref 134–144)

## 2018-05-17 LAB — METHYLMALONIC ACID, SERUM: METHYLMALONIC ACID: 63 nmol/L (ref 0–378)

## 2018-05-17 LAB — VITAMIN B12: Vitamin B-12: >2000 pg/mL — ABNORMAL HIGH (ref 232–1245)

## 2018-05-31 NOTE — Progress Notes (Signed)
Patient ID: Krista Sosa, female   DOB: Nov 26, 1950, 68 y.o.   MRN: [id number]  Lead level: Undetectable  BMP Glucose 338, K 4.5, Cr 0.52, eGFR 116  No adverse sequelae from the lisinopril  Vitamin B12 > 2000, MMA 63  My suspicion is that she does not have vitamin B12 deficiency.  She was told she does not require vitamin B12 supplementation.  While on the phone she ask that I resend a letter asking for permission to have a dog for a companion.  This was reprinted and will be mailed to her current address which I confirmed remains accurate in the EMR.

## 2018-06-17 ENCOUNTER — Emergency Department (HOSPITAL_COMMUNITY): Payer: Medicare HMO

## 2018-06-17 ENCOUNTER — Emergency Department (HOSPITAL_COMMUNITY)
Admission: EM | Admit: 2018-06-17 | Discharge: 2018-06-17 | Disposition: A | Discharge status: Home / Self Care | Payer: Medicare HMO | Attending: Emergency Medicine | Admitting: Emergency Medicine

## 2018-06-17 ENCOUNTER — Telehealth: Payer: Self-pay | Admitting: Internal Medicine

## 2018-06-17 ENCOUNTER — Other Ambulatory Visit: Payer: Self-pay

## 2018-06-17 ENCOUNTER — Encounter (HOSPITAL_COMMUNITY): Payer: Self-pay | Admitting: *Deleted

## 2018-06-17 DIAGNOSIS — F17210 Nicotine dependence, cigarettes, uncomplicated: Secondary | ICD-10-CM | POA: Insufficient documentation

## 2018-06-17 DIAGNOSIS — Z79899 Other long term (current) drug therapy: Secondary | ICD-10-CM | POA: Diagnosis not present

## 2018-06-17 DIAGNOSIS — R05 Cough: Secondary | ICD-10-CM | POA: Insufficient documentation

## 2018-06-17 DIAGNOSIS — E1129 Type 2 diabetes mellitus with other diabetic kidney complication: Secondary | ICD-10-CM | POA: Diagnosis not present

## 2018-06-17 DIAGNOSIS — J4520 Mild intermittent asthma, uncomplicated: Secondary | ICD-10-CM | POA: Insufficient documentation

## 2018-06-17 DIAGNOSIS — R053 Chronic cough: Secondary | ICD-10-CM

## 2018-06-17 DIAGNOSIS — Z7984 Long term (current) use of oral hypoglycemic drugs: Secondary | ICD-10-CM | POA: Diagnosis not present

## 2018-06-17 DIAGNOSIS — I10 Essential (primary) hypertension: Secondary | ICD-10-CM | POA: Diagnosis not present

## 2018-06-17 NOTE — Telephone Encounter (Signed)
Return pt's call - stated everything that was ordered at her last visit has not helped. She's getting "sicker and sicker". She took the full course of abx; she has taken "acid reflux medication" and "allergy medication" and even OTC meds. "I need a strong antibiotic".  Stated she will schedule another appt if she has to.

## 2018-06-17 NOTE — Telephone Encounter (Signed)
Pt state the medicine isn't working, pls contact [phone]

## 2018-06-17 NOTE — Telephone Encounter (Signed)
Pt is calling back regarding medicine, pls contact [phone]

## 2018-06-17 NOTE — ED Notes (Signed)
Pt reports continuing cough, rhinorrhea, and fullness feeling in her ears bilaterally.  She reports sxs onset was 2 months ago and has been in the ED and to her PCP.  She reports she is feeling better is still having cough.  She is wondering why she was prescribed famotidine for cough without relief.  She reports some of her co-workers had the same cough and was put on an abx and are now feeling better.  She finished a course of abx therapy for her sxs without relief.  She is a smoker of more than 20 years.

## 2018-06-17 NOTE — ED Triage Notes (Signed)
Pt reports cough for 2 months with productive yellow and white sputum. Pt reports that nothing is working.

## 2018-06-17 NOTE — Discharge Instructions (Signed)
We recommend daily Zyrtec for sinus congestion with the use of a saline sinus rinse such as the [employer]. Continue your daily medications. Follow up with your primary care doctor.

## 2018-06-17 NOTE — Telephone Encounter (Signed)
Pt stated the medicine isn't working, she would like a nurse or physician to call her [phone]

## 2018-06-17 NOTE — ED Provider Notes (Signed)
[name] Provider Note   CSN: [id number] Arrival date & time: 06/17/18  1932     History   Chief Complaint Chief Complaint  Patient presents with  . Cough    HPI Krista Sosa is a 68 y.o. female.  68 year old female with a history of asthma, esophageal reflux, hypertension presents to the emergency department for evaluation of persistent cough.  She states that she has had a cough since September when she was given a flu shot.  She was seen in the emergency department in November and started on Tessalon as well as a course of doxycycline.  Has since followed up with her primary care doctor x2 with outpatient CT scan in December showing chronic changes from her smoking as well as suspicion for MAC.  She was put on a subsequent course of ciprofloxacin which she has completed.  The patient has also been using famotidine as well as an inhaled corticosteroid.  States that her wheezing and chest congestion feel improved, but she continues to experience nasal congestion and bilateral ear fullness.  Has been using Flonase without relief.  No fevers, hemoptysis.  Continues to smoke approximately 4 cigarettes/day.  Does not use an ACE inhibitor.  The history is provided by the patient. No language interpreter was used.  Cough    Past Medical History:  Diagnosis Date  . Allergy   . Asthma, chronic 06/09/2006  . Bilateral cataracts 05/07/2016   s/p left cataract extraction 10/02/2016 and right cataract extraction 10/16/2016  . Bronchiectasis without complication ([place]) 60/63/0160   Mild left lower lobe bronchiectasis seen on CT scan of the thorax in December 2019  . Cataract    bil eyes  . Essential hypertension 02/12/2018  . Gastroesophageal reflux disease 08/13/2011  . Glaucoma    Per patient report  . Hx of adenomatous colonic polyps 11/16/2008  . Hypertension    pt said she is supposed to take med but she is not taking it.  . Hypertensive  retinopathy, grade 2, bilateral 05/07/2016  . Latent tuberculosis    dx 03/17/08 - tx with INH X 9 months  . Lichenoid dermatitis 02/13/2012   Previously treated with Triamcinolone 0.025% cream Q8-12H PRN   . Osteoarthritis, generalized 05/21/2006   Diffuse: cervical spine, right shoulder, left wrist Ineffective medications: Tylenol, ASA, Naprosyn, Ibuprofen, Meloxicam    . Overweight (BMI 25.0-29.9) 02/13/2012  . Perennial allergic rhinitis with seasonal variation 06/09/2006  . Persistent microalbuminuria associated with type II diabetes mellitus ([place]) 05/21/2006  . Sickle cell anemia (HCC)    pt has sickle cell trait per pt  . Sickle cell trait ([place]) 02/13/2012  . Tobacco abuse disorder 06/09/2006  . Tubular adenoma of rectum 11/16/2008   5 mm, excised endoscopically 11/16/2008, repeat colonoscopy due July 2015   . Ulnar neuropathy of right upper extremity 02/14/2016  . Urinary tract disease    JICP    Patient Active Problem List   Diagnosis Date Noted  . Vitamin B12 deficiency 05/14/2018  . Lead exposure 05/14/2018  . Bronchiectasis without complication ([place]) 10/93/2355  . Chronic cough 05/05/2018  . Essential hypertension 02/12/2018  . Major depressive disorder, recurrent episode, mild with anxious distress ([place]) 06/05/2017  . Hypertensive retinopathy, grade 2, bilateral 05/07/2016  . Healthcare maintenance 09/03/2012  . Lichenoid dermatitis 02/13/2012  . Sickle cell trait ([place]) 02/13/2012  . Overweight (BMI 25.0-29.9) 02/13/2012  . Gastroesophageal reflux disease without esophagitis 08/13/2011  . Hx of adenomatous colonic polyps 11/16/2008  .  Tobacco abuse disorder 06/09/2006  . Perennial allergic rhinitis with seasonal variation 06/09/2006  . Mild intermittent asthma 06/09/2006  . Type 2 diabetes mellitus with microalbuminuria, without long-term current use of insulin ([place]) 05/21/2006  . Osteoarthritis, generalized 05/21/2006    Past Surgical History:  Procedure Laterality  Date  . APPENDECTOMY    . CATARACT EXTRACTION, BILATERAL Bilateral    OS 10/10/2016, OD 10/16/2016  . COLONOSCOPY  11/16/08  . FOOT SURGERY  07/31/2008   Exostectomy right hallus  . TUBAL LIGATION       OB History   No obstetric history on file.      Home Medications    Prior to Admission medications   Medication Sig Start Date End Date Taking? Authorizing Provider  albuterol (PROVENTIL HFA;VENTOLIN HFA) 108 (90 Base) MCG/ACT inhaler Inhale 1-2 puffs into the lungs every 6 (six) hours as needed for wheezing or shortness of breath. 04/21/18   Hedges, Dellis Filbert, PA-C  albuterol (VENTOLIN HFA) 108 (90 Base) MCG/ACT inhaler Inhale 1-2 puffs into the lungs every 6 (six) hours as needed for shortness of breath. 10/09/17   Oval Linsey, MD  aspirin EC 81 MG tablet Take 1 tablet (81 mg total) by mouth daily. Patient not taking: Reported on 10/07/2017 01/29/17 01/29/18  Oval Linsey, MD  Blood Glucose Monitoring Suppl (TRUE METRIX AIR GLUCOSE METER) w/Device KIT 1 Device by Does not apply route daily AND 1 strip daily. Use to test blood glucose 1-2 times daily. Dx: E11.29. 10/09/17   Oval Linsey, MD  chlorpheniramine (CHLOR-TRIMETON) 4 MG tablet Take 1 tablet (4 mg total) by mouth 2 (two) times daily as needed for allergies. 05/14/18   Oval Linsey, MD  ciprofloxacin (CIPRO) 750 MG tablet Take 1 tablet (750 mg total) by mouth 2 (two) times daily. 05/14/18   Oval Linsey, MD  Fluticasone-Salmeterol (ADVAIR) 250-50 MCG/DOSE AEPB Inhale 1 puff into the lungs 2 (two) times daily. 05/14/18   Oval Linsey, MD  glipiZIDE (GLUCOTROL XL) 10 MG 24 hr tablet Take 2 tablets (20 mg total) by mouth daily with breakfast. Patient not taking: Reported on 05/14/2018 10/09/17   Oval Linsey, MD  glucose blood (TRUE METRIX BLOOD GLUCOSE TEST) test strip Use to test blood glucose 1-2 times daily. Dx: E11.29 10/09/17   Oval Linsey, MD  ibuprofen (ADVIL,MOTRIN) 800 MG tablet Take 1 tablet (800 mg total)  by mouth every 8 (eight) hours as needed for moderate pain. 10/09/17   Oval Linsey, MD  losartan (COZAAR) 50 MG tablet Take 1 tablet (50 mg total) by mouth daily. 05/05/18   Ina Homes, MD  metFORMIN (GLUCOPHAGE) 1000 MG tablet Take 1 tablet (1,000 mg total) by mouth 2 (two) times daily with a meal. Patient not taking: Reported on 05/14/2018 06/01/17   Oval Linsey, MD  omeprazole (PRILOSEC) 40 MG capsule Take 1 capsule (40 mg total) by mouth daily. 05/14/18   Oval Linsey, MD  sertraline (ZOLOFT) 100 MG tablet Take 2 tablets (200 mg total) by mouth daily. 02/12/18   Oval Linsey, MD  simvastatin (ZOCOR) 20 MG tablet Take 1 tablet (20 mg total) by mouth every evening. 10/09/17   Oval Linsey, MD  sitaGLIPtin (JANUVIA) 100 MG tablet Take 1 tablet (100 mg total) by mouth daily. Patient not taking: Reported on 05/14/2018 10/09/17   Oval Linsey, MD  traZODone (DESYREL) 100 MG tablet Take 1 tablet (100 mg total) by mouth at bedtime as needed for sleep. 10/09/17   Oval Linsey, MD  triamcinolone (KENALOG) 0.025 % ointment  APPLY TOPICALLY TWICE DAILY AS DIRECTED 10/09/17   Oval Linsey, MD  triamcinolone (NASACORT ALLERGY 24HR) 55 MCG/ACT AERO nasal inhaler Place 2 sprays into the nose daily as needed.    [provider]  TRUEPLUS LANCETS 33G MISC Use to test blood glucose 1-2 daily. E11.29 10/09/17   Oval Linsey, MD  vitamin B-12 (CYANOCOBALAMIN) 1000 MCG tablet Take 1 tablet (1,000 mcg total) by mouth daily. 10/09/17   Oval Linsey, MD  Triamcinolone Acetonide (TRIAMCINOLONE 0.1 % CREAM : EUCERIN) CREA Apply 1 application topically 2 (two) times daily as needed. 01/13/13 04/18/13  Oval Linsey, MD    Family History Family History  Problem Relation Age of Onset  . Diabetes Mother   . Heart disease Mother 62  . Colon cancer Mother   . Cancer Father   . Sickle cell anemia Sister   . Sickle cell anemia Brother   . Diabetes Sister   . Brain cancer Sister   .  Lung cancer Brother   . Healthy Sister   . Healthy Sister   . Healthy Sister   . Healthy Daughter   . Healthy Son   . Healthy Daughter   . Healthy Daughter   . Healthy Son   . Wilson's disease Maternal Uncle   . Breast cancer Maternal Aunt   . Breast cancer Other   . Esophageal cancer Neg Hx   . Rectal cancer Neg Hx   . Stomach cancer Neg Hx     Social History Social History   Tobacco Use  . Smoking status: Current Every Day Smoker    Packs/day: 0.50    Years: 25.00    Pack years: 12.50    Types: Cigarettes  . Smokeless tobacco: Never Used  Substance Use Topics  . Alcohol use: Yes    Alcohol/week: 0.0 standard drinks    Comment: occasionally  . Drug use: No    Comment: positive marijuana metabolite in 2010     Allergies   Patient has no known allergies.   Review of Systems Review of Systems  Respiratory: Positive for cough.   Ten systems reviewed and are negative for acute change, except as noted in the HPI.    Physical Exam Updated Vital Signs BP (!) 153/73 (BP Location: Right Arm)   Pulse 91   Temp 98.2 F (36.8 C) (Oral)   Resp 17   LMP 08/27/2007   SpO2 97%   Physical Exam Vitals signs and nursing note reviewed.  Constitutional:      General: She is not in acute distress.    Appearance: She is well-developed. She is not diaphoretic.     Comments: Nontoxic-appearing and in no distress  HENT:     Head: Normocephalic and atraumatic.     Right Ear: Ear canal and external ear normal.     Left Ear: Ear canal and external ear normal.     Ears:     Comments: Mild serous effusion bilaterally.  No injected or erythematous TM bilaterally.    Nose: Congestion present.     Mouth/Throat:     Mouth: Mucous membranes are moist.  Eyes:     General: No scleral icterus.    Conjunctiva/sclera: Conjunctivae normal.  Neck:     Musculoskeletal: Normal range of motion.     Comments: No meningismus Cardiovascular:     Rate and Rhythm: Normal rate and regular  rhythm.     Pulses: Normal pulses.  Pulmonary:     Effort: Pulmonary effort is normal.  No respiratory distress.     Breath sounds: No stridor. No wheezing or rales.     Comments: Lungs grossly clear.  There is a sporadic, dry, nonproductive cough appreciated. Musculoskeletal: Normal range of motion.  Skin:    General: Skin is warm and dry.     Coloration: Skin is not pale.     Findings: No erythema or rash.  Neurological:     Mental Status: She is alert and oriented to person, place, and time.  Psychiatric:        Behavior: Behavior normal.      ED Treatments / Results  Labs (all labs ordered are listed, but only abnormal results are displayed) Labs Reviewed - No data to display  EKG None  Radiology Dg Chest 2 View  Result Date: 06/17/2018 CLINICAL DATA:  68 year old female with cough. EXAM: CHEST - 2 VIEW COMPARISON:  Chest CT dated 05/11/2018 FINDINGS: There is mild emphysematous changes of the lungs. No focal consolidation, pleural effusion, or pneumothorax. The cardiac silhouette is within normal limits. Osteopenia with degenerative changes of the spine and mild chronic midthoracic compression deformity. No acute osseous pathology. IMPRESSION: No active cardiopulmonary disease. Electronically Signed   By: Anner Crete M.D.   On: 06/17/2018 20:10    Procedures Procedures (including critical care time)  Medications Ordered in ED Medications - No data to display   Initial Impression / Assessment and Plan / ED Course  I have reviewed the triage vital signs and the nursing notes.  Pertinent labs & imaging results that were available during my care of the patient were reviewed by me and considered in my medical decision making (see chart for details).     68 year old female presents to the emergency department for evaluation of a chronic cough.  She has clear lung sounds bilaterally.  No fever or hypoxia.  Denies any preceding hemoptysis.  Also noted to have a low  Wells score.  Repeat x-ray today without evidence of pleural effusion, pneumothorax, pneumonia.  She is noting ear fullness with serous effusion bilaterally.  Suspect a component of postnasal drip aggravating cough.  She is already on Flonase.  Will start on daily Zyrtec.  I have encouraged the use of saline sinus rinses.  No indication for further antibiotic therapy.  Counseled on smoking cessation.  Return precautions discussed and provided. Patient discharged in stable condition with no unaddressed concerns.   Final Clinical Impressions(s) / ED Diagnoses   Final diagnoses:  Chronic cough    ED Discharge Orders    None       Beverely Pace 06/17/18 2226    Davonna Belling, MD 06/17/18 [phone]

## 2018-06-18 NOTE — Telephone Encounter (Signed)
Per Epic, pt went to ED yesterday.

## 2018-08-02 ENCOUNTER — Telehealth: Payer: Self-pay | Admitting: *Deleted

## 2018-08-02 DIAGNOSIS — I10 Essential (primary) hypertension: Secondary | ICD-10-CM

## 2018-08-02 DIAGNOSIS — E1129 Type 2 diabetes mellitus with other diabetic kidney complication: Secondary | ICD-10-CM

## 2018-08-02 DIAGNOSIS — R809 Proteinuria, unspecified: Principal | ICD-10-CM

## 2018-08-02 MED ORDER — LISINOPRIL 40 MG PO TABS: 40.0000 mg | ORAL_TABLET | Freq: Every day | ORAL | 3 refills | Status: DC

## 2018-08-02 NOTE — Telephone Encounter (Signed)
LOSARTAN 50mg  is on backorder, pharmacy request pt's losartan be changed to something comparable. Please advise, they have no idea when the backorder will end.

## 2018-08-02 NOTE — Telephone Encounter (Signed)
It looks like she was switched from lisinopril to losartan for a chronic cough attributed to the lisinopril.  She had other reasons for a chronic cough including asthma and tobacco use.  Given the back order of the losartan I would like to re-challenge with lisinopril because I am not convinced this was the cause of her cough and ACEI would be ideal given her diabetes with microalbuminuria.  If her cough returns we can consider a different ARB.  If her cough does not return, continuing the lisinopril for the long term will be the plan.  This prescription was sent to her pharmacy.

## 2018-08-04 NOTE — Telephone Encounter (Addendum)
Call made to patient to make her aware of the change.  Pt verbalized understanding, but also requests an appt to be seen for cold-like symptoms that she has had since getting influenza vaccine in Sept 2019.  Pt given an appt for 3/13 with her pcp.Krista Hidden Cassady3/11/20204:24 PM

## 2018-08-06 ENCOUNTER — Encounter: Payer: Self-pay | Admitting: Internal Medicine

## 2018-08-06 ENCOUNTER — Ambulatory Visit (INDEPENDENT_AMBULATORY_CARE_PROVIDER_SITE_OTHER): Payer: Medicare HMO | Admitting: Internal Medicine

## 2018-08-06 ENCOUNTER — Other Ambulatory Visit: Payer: Self-pay

## 2018-08-06 VITALS — BP 134/56 | HR 92 | Temp 98.1°F | Ht 66.0 in | Wt 158.8 lb

## 2018-08-06 DIAGNOSIS — Z7951 Long term (current) use of inhaled steroids: Secondary | ICD-10-CM

## 2018-08-06 DIAGNOSIS — J3089 Other allergic rhinitis: Secondary | ICD-10-CM

## 2018-08-06 DIAGNOSIS — J4520 Mild intermittent asthma, uncomplicated: Secondary | ICD-10-CM

## 2018-08-06 DIAGNOSIS — G43909 Migraine, unspecified, not intractable, without status migrainosus: Secondary | ICD-10-CM | POA: Diagnosis not present

## 2018-08-06 DIAGNOSIS — I10 Essential (primary) hypertension: Secondary | ICD-10-CM | POA: Diagnosis not present

## 2018-08-06 DIAGNOSIS — R809 Proteinuria, unspecified: Secondary | ICD-10-CM | POA: Diagnosis not present

## 2018-08-06 DIAGNOSIS — R05 Cough: Secondary | ICD-10-CM | POA: Diagnosis not present

## 2018-08-06 DIAGNOSIS — Z7984 Long term (current) use of oral hypoglycemic drugs: Secondary | ICD-10-CM

## 2018-08-06 DIAGNOSIS — G43709 Chronic migraine without aura, not intractable, without status migrainosus: Secondary | ICD-10-CM

## 2018-08-06 DIAGNOSIS — J302 Other seasonal allergic rhinitis: Secondary | ICD-10-CM

## 2018-08-06 DIAGNOSIS — E1129 Type 2 diabetes mellitus with other diabetic kidney complication: Secondary | ICD-10-CM

## 2018-08-06 DIAGNOSIS — Z79899 Other long term (current) drug therapy: Secondary | ICD-10-CM

## 2018-08-06 DIAGNOSIS — Z792 Long term (current) use of antibiotics: Secondary | ICD-10-CM

## 2018-08-06 DIAGNOSIS — L853 Xerosis cutis: Secondary | ICD-10-CM

## 2018-08-06 DIAGNOSIS — J329 Chronic sinusitis, unspecified: Secondary | ICD-10-CM

## 2018-08-06 DIAGNOSIS — F17201 Nicotine dependence, unspecified, in remission: Secondary | ICD-10-CM

## 2018-08-06 DIAGNOSIS — Z720 Tobacco use: Secondary | ICD-10-CM

## 2018-08-06 DIAGNOSIS — J479 Bronchiectasis, uncomplicated: Secondary | ICD-10-CM

## 2018-08-06 DIAGNOSIS — R053 Chronic cough: Secondary | ICD-10-CM

## 2018-08-06 DIAGNOSIS — F419 Anxiety disorder, unspecified: Secondary | ICD-10-CM

## 2018-08-06 DIAGNOSIS — F330 Major depressive disorder, recurrent, mild: Secondary | ICD-10-CM

## 2018-08-06 HISTORY — DX: Migraine, unspecified, not intractable, without status migrainosus: G43.909

## 2018-08-06 LAB — POCT GLYCOSYLATED HEMOGLOBIN (HGB A1C): Hemoglobin A1C: 10.5 % — AB (ref 4.0–5.6)

## 2018-08-06 LAB — GLUCOSE, CAPILLARY: Glucose-Capillary: 243 mg/dL — ABNORMAL HIGH (ref 70–99)

## 2018-08-06 MED ORDER — EUCERIN EX CREA: TOPICAL_CREAM | CUTANEOUS | 1 refills | Status: AC | PRN

## 2018-08-06 MED ORDER — TRIAMCINOLONE ACETONIDE 55 MCG/ACT NA AERO: 2.0000 | INHALATION_SPRAY | Freq: Every day | NASAL | 3 refills | Status: DC | PRN

## 2018-08-06 MED ORDER — AMOXICILLIN-POT CLAVULANATE 500-125 MG PO TABS: 1.0000 | ORAL_TABLET | Freq: Three times a day (TID) | ORAL | 0 refills | Status: DC

## 2018-08-06 MED ORDER — PSEUDOEPHEDRINE HCL 30 MG PO TABS: 30.0000 mg | ORAL_TABLET | ORAL | 1 refills | Status: DC | PRN

## 2018-08-06 NOTE — Assessment & Plan Note (Signed)
Assessment  Symptomatically she did not respond to the course of ciprofloxacin for her presumed exacerbation of her bronchiectasis.  She continues to have a cough productive of a yellowish-green sputum.  This could very well be related to her bronchiectasis.  Plan  She is being treated with amoxicillin-clavulanic acid 500 mg by mouth 3 times daily for 3 weeks in order to treat her presumed chronic sinusitis.  I am hopeful that this course of antibiotics will also treat her exacerbation of her bronchiectasis, which also may be playing a role in her productive cough.  We will reassess her response to this antibiotic course at the follow-up visit.

## 2018-08-06 NOTE — Assessment & Plan Note (Signed)
Assessment  Despite switching to a first generation antihistamine and a one-week course of ciprofloxacin for another indication she continues to experience significant congestion and sinus pressure that are unchanged since September.  I am concerned she may have a chronic sinusitis or other sinus issue that has yet to be identified given her lack of response to aggressive pharmacotherapy for her allergic rhinitis.  Plan  We will continue with the chlorpheniramine as a first generation antihistamine.  I have added pseudoephedrine 30 mg by mouth every 4 hours as needed for congestion in hopes of providing her with a decongestant in relieving some of her sinus pressure.  We have started amoxicillin-clavulanic acid 500 mg by mouth 3 times daily for 21 days to treat a presumed chronic sinusitis.  We are getting a CT scan of the sinuses to get a better assessment of the current anatomic state.  Her diabetes also remains poorly controlled and puts her at risk for fungal infections although it is unlikely that a fungal infection would be this chronic without further clinical deterioration.  Depending upon her response to the antibiotics and the results of the CT scan of the sinuses she will likely require referral to an ear nose and throat physician for further evaluation and intervention.  We will reassess her response to the above intervention and follow-up in 1 month.

## 2018-08-06 NOTE — Assessment & Plan Note (Signed)
Assessment  Her blood pressure today on lisinopril 40 mg by mouth daily was 134/56.  This is within target.  Unfortunately, she has a chronic cough and the ACE inhibitor/ARB class has not been completely eliminated as a potential cause.  Plan  She was asked to discontinue the lisinopril 40 mg by mouth daily and we will reassess both her blood pressure and her chronic cough at her return visit in 1 month.

## 2018-08-06 NOTE — Patient Instructions (Addendum)
It was good to see you today.  I am sorry you are feeling so bad!  1) I started pseudoephedrine 30 mg every 4 hours as needed for nasal congestion.  This is to try to drain your sinuses.  2) I started augmentin 500 mg by mouth three times a day for three weeks to see if we can treat your chronic sinusitis.  3) I renewed your nasal spray.  4) I started eucerin cream for your dry skin.  5) I ordered a scan of your sinuses to make sure we are not missing something, especially if the new medications do not work and I need to send you to a sinus Psychologist, sport and exercise.  6) Keep taking the excedrin for your cough.  7) Start taking all of your medications as prescribed.  If we do not get control of your diabetes we may have trouble treating your sinus infection.  8) Keep gargling with the salt water for your throat.  I will see you back in 1 month to see how these medications are working on your sinus congestion.

## 2018-08-06 NOTE — Assessment & Plan Note (Signed)
Assessment  She states that since she has been feeling so poorly for the last 6 months she has not had the desire to smoke and has therefore stopped smoking.  Plan  She was congratulated for tobacco cessation and was encouraged to continue to avoid smoking, even if she were to begin feeling better from her sinus congestion.  We will reassess her success in avoiding further tobacco product use at the follow-up visit.

## 2018-08-06 NOTE — Assessment & Plan Note (Signed)
Assessment  Her major depressive disorder is well controlled with a PHQ 9 score of 5 which is been stable on her sertraline 200 mg by mouth daily.  Plan  We will continue the sertraline at 200 mg by mouth daily and reassess the efficacy of this therapy with a repeat PHQ 9 at the follow-up visit.

## 2018-08-06 NOTE — Assessment & Plan Note (Signed)
Assessment  Approximately 1 month ago she had a migraine headache which she described as a pounding left-sided pain behind the eye that was severe.  It was associated with photophobia and phonophobia and required her to lie in a dark room.  She was essentially disabled from the headache for 1-1/2 days.  She was given Excedrin which helped with the headache.  Of note, she has a history of migraine headaches but these have been relatively unusual recently.  She has not had an additional migraine headache since the one approximately 1 month ago.  Plan  We will continue to monitor for recurrence of her migraine headaches.  She was encouraged to continue with the Excedrin as needed should they recur.

## 2018-08-06 NOTE — Assessment & Plan Note (Signed)
Assessment  She has a persistent cough that is chronic by definition.  She has not responded to high-dose PPI therapy, her usual asthma therapy, or a first generation antihistamine for an upper airway cough syndrome.  Adherence to her regimen has been challenging in the past and may explain some of the lack of response that she has had although she is quite congested on examination and I remain concerned about a postnasal drip secondary to an upper airway cough syndrome.  She has been switched from lisinopril to losartan and back to the lisinopril when losartan was on national back order.  This too could be contributing to her chronic cough.  She states that as she has been feeling very poorly so she has not been smoking, and finally she has had a recent chest x-ray that has been without anatomic abnormalities.  Plan  We have stopped the lisinopril to see if this is contributing to her cough.  I have encouraged her to take her asthma therapy, PPI therapy, and antihistamine with decongestant regularly.  We will evaluate her sinuses with a CT scan of the sinuses to make sure we are not missing an upper airway anatomic explanation for a continued upper airway cough syndrome.  We have also started a 3-week course of amoxicillin-clavulanic acid to treat a presumed chronic sinusitis which could also be contributing to the chronic cough.  We will reassess the efficacy of the above intervention in managing her cough in 1 month.

## 2018-08-06 NOTE — Progress Notes (Signed)
   Subjective:    Patient ID: Krista Sosa, female    DOB: 1950/07/31, 68 y.o.   MRN: [id number]  HPI  Krista Sosa is here for follow-up of her perennial allergic rhinitis that has evolved into a chronic sinusitis, chronic cough, type 2 diabetes with microalbuminuria, bronchiectasis without complication, tobacco use disorder, mild intermittent asthma, major depressive disorder, migraine headaches, and essential hypertension. Please see the A&P for the status of the pt's chronic medical problems.  She continues to have nasal congestion, cough, and headaches despite the changes made at the last visit with me.  Her cough is productive of green sputum, her migraine headaches do respond to OTC Excedrin, but her nasal congestion has persisted and did not respond to the course of ciprofloxacin given to her for what was presumed to be an exacerbation of her bronchiectasis.  She was without acute complaints.  Review of Systems  Constitutional: Negative for activity change, fever and unexpected weight change.  HENT: Positive for congestion, sinus pressure, sinus pain, sore throat and voice change. Negative for nosebleeds.   Respiratory: Positive for cough, chest tightness and shortness of breath. Negative for stridor.   Cardiovascular: Negative for chest pain and leg swelling.  Musculoskeletal: Positive for arthralgias. Negative for gait problem and joint swelling.  Allergic/Immunologic: Positive for environmental allergies.  Neurological: Positive for headaches.  Psychiatric/Behavioral: Positive for dysphoric mood and sleep disturbance.      Objective:   Physical Exam Vitals signs and nursing note reviewed.  Constitutional:      General: She is not in acute distress.    Appearance: She is normal weight. She is ill-appearing. She is not toxic-appearing or diaphoretic.  HENT:     Head: Normocephalic and atraumatic.     Nose: Congestion present. No rhinorrhea.  Eyes:     General: No  scleral icterus.       Right eye: No discharge.        Left eye: No discharge.  Pulmonary:     Effort: Pulmonary effort is normal. No respiratory distress.     Breath sounds: No stridor.  Musculoskeletal: Normal range of motion.        General: No swelling or deformity.     Right lower leg: No edema.     Left lower leg: No edema.  Skin:    General: Skin is warm and dry.     Coloration: Skin is not jaundiced.  Neurological:     Mental Status: She is alert and oriented to person, place, and time. Mental status is at baseline.     Coordination: Coordination normal.     Gait: Gait normal.  Psychiatric:        Mood and Affect: Mood normal.        Behavior: Behavior normal.        Thought Content: Thought content normal.        Judgment: Judgment normal.       Assessment & Plan:   Please see problem oriented charting.

## 2018-08-06 NOTE — Assessment & Plan Note (Signed)
Assessment  She continues to have dryness of the skin and is asking for a moisturizing cream.  Plan  She was prescribed Eucerin cream and we will reassess the efficacy of this intervention on her xerosis at the follow-up visit.

## 2018-08-06 NOTE — Assessment & Plan Note (Signed)
Assessment  Her diabetes remains poorly controlled with hemoglobin A1c of 10.5.  She freely admits that she has not been taking her oral anti-hyperglycemics nor has she been following any particular diet as she has been feeling so poorly from her cough and sinus congestion.  We discussed the possible implications of poorly controlled diabetes on managing any infection including her sinusitis.  Her ACE inhibitor has been discontinued to assess its role in her chronic cough.  Plan  I encouraged her to take her Glucotrol XL 20 mg by mouth daily, metformin 1000 mg by mouth twice daily, and Januvia at 100 mg by mouth daily in hopes of getting better glycemic control and avoiding injection therapy.  We will reassess her glycemic control in 3 months with a repeat hemoglobin A1c if she has been able to maintain adherence with her oral diabetic regimen.  We will reassess the need for an ACE inhibitor or ARB depending upon her response to withholding these agents and her chronic cough.

## 2018-08-06 NOTE — Assessment & Plan Note (Signed)
Assessment  She states her asthma symptoms are well controlled on the Advair 250-50 mcg 1 puff 2 times daily and as needed albuterol despite her chronic sinusitis.  Plan  We will continue the Advair 250-50 mcg 1 puff 2 times daily as and as needed albuterol and reassess the efficacy of this maintenance therapy at the follow-up visit.

## 2018-08-09 ENCOUNTER — Telehealth: Payer: Self-pay | Admitting: Internal Medicine

## 2018-08-09 ENCOUNTER — Other Ambulatory Visit: Payer: Self-pay | Admitting: Internal Medicine

## 2018-08-09 ENCOUNTER — Other Ambulatory Visit: Payer: Self-pay | Admitting: *Deleted

## 2018-08-09 DIAGNOSIS — E1129 Type 2 diabetes mellitus with other diabetic kidney complication: Secondary | ICD-10-CM

## 2018-08-09 DIAGNOSIS — J302 Other seasonal allergic rhinitis: Secondary | ICD-10-CM

## 2018-08-09 DIAGNOSIS — M159 Polyosteoarthritis, unspecified: Secondary | ICD-10-CM

## 2018-08-09 DIAGNOSIS — R809 Proteinuria, unspecified: Secondary | ICD-10-CM

## 2018-08-09 DIAGNOSIS — J3089 Other allergic rhinitis: Principal | ICD-10-CM

## 2018-08-09 DIAGNOSIS — F329 Major depressive disorder, single episode, unspecified: Secondary | ICD-10-CM

## 2018-08-10 NOTE — Telephone Encounter (Signed)
Called made to [employer]-states pt's Augmentin rx was shipped out yesterday, delivery takes 3-5 days. Sudafed, eucerin, and Nasacort are available otc and was not filled.  Pt was made aware to pick up otc meds and wait for delivery of Augmentin.Regenia Skeeter, Nicki Gracy Cassady3/17/20209:31 AM   All maintenance meds will continue to be filled by [facility], while medications for acute problems will be sent to [facility].  No further action needed in this encounter, phone call complete.Regenia Skeeter, Jihan Mellette Cassady3/17/20209:39 AM

## 2018-08-10 NOTE — Telephone Encounter (Signed)
Called made to [employer]-states pt's Augmentin rx was shipped out yesterday, delivery takes 3-5 days. Sudafed, eucerin, and Nasacort are available otc and was not filled.  Pt was made aware to pick up otc meds and wait for delivery of Augmentin.Regenia Skeeter, Darlene Cassady3/17/20209:31 AM   All maintenance meds will continue to be filled by [facility], while medications for acute problems will be [facility]. Will send refill request to pcp.Regenia Skeeter, Darlene Cassady3/17/20209:33 AM

## 2018-08-22 ENCOUNTER — Encounter: Payer: Self-pay | Admitting: *Deleted

## 2018-08-31 ENCOUNTER — Telehealth: Payer: Self-pay | Admitting: Dietician

## 2018-08-31 NOTE — Telephone Encounter (Signed)
Reaching out to Ms. Umble to assess her diabetes needs. She agreed to a phone visit tomorrow at [place], RD 08/31/2018 2:22 PM.

## 2018-09-01 ENCOUNTER — Ambulatory Visit: Payer: Medicare HMO | Admitting: Dietician

## 2018-09-01 ENCOUNTER — Other Ambulatory Visit: Payer: Self-pay

## 2018-09-01 NOTE — Progress Notes (Signed)
Telephone visit due to COVID-19.  Krista Sosa is a 68 y.o. female who was contacted on behalf of [facility] nutrition and diabetes services  Thinks her diabetes is doing "Pretty good". Thinks sickness in September after flu shot ran up her blood sugars. No appetite, so stopped her diabetes meds. She feels her A1C will be better the next time it is checked because she is better now and has been back on all her diabetes medicine. Dislikes sticking her finger. She asked about the [facility]. I informed her of medicare's coverage qualifications. I briefly educated her about some of the newer diabetes medicines not requiring self monitoring. Suggest professional CGM when able and appropriate.   Diet:  what meal plan do you follow? Eats moderate portions.  What times do you eat?9am, 2-3 pm, 7-8 PM, beverages- water, tea- buys already sweet, drinks every now and then, orange juice 1x/week, gatorade, green tea 1-2x/week, coffee- 1 cupeveryday  Monitoring: has a problem sticking her finger. Would lik eot keep better check of it.  Are you checking blood sugars at home? Maybe 1-2x/week What are the results? [id number] symptoms of high andf low blood sugar: no  Medications:  what medications are you taking for diabetes? Met, glipizide, januvia,  Are you getting your medicines filled on time without skip[ping any doses? yes "Are you having any problems getting/taking your meds (cost, timing, transportation)?" no Do you need any meds refilled? no  Exercise:  What type of activity are you doing? Works part-time as a custodian 4 hours/day 5 days/week How often- how many days a week and how many minutes each time?  Goes Fishing   Sleep: What time do you wake up? 600 am What time do you go to sleep? 10-11 pm How often to wake up during that time?sleeps well.  Labs/Anthropometrics:  Lab Results  Component Value Date   HGBA1C 10.5 (A) 08/06/2018   HGBA1C 10.6 (A) 05/14/2018   HGBA1C 9.1 (A)  02/12/2018    Wt Readings from Last 5 Encounters:  08/06/18 158 lb 12.8 oz (72 kg)  05/14/18 159 lb 4.8 oz (72.3 kg)  05/05/18 160 lb 8 oz (72.8 kg)  04/21/18 145 lb (65.8 kg)  02/12/18 164 lb 9.6 oz (74.7 kg)   Plan:  Date of follow-up visit scheduled- none- told we'd be in touch.  Any other questions or concerns?" no  [name], RD 09/01/2018 2:09 PM.

## 2018-09-01 NOTE — Patient Instructions (Signed)
Hi [name],  It was great talking to you on the phone today!   I forgot to mention that we have a doctor's office version of the Freestyle Libre Continuous glucose monitor. Your insurance pays for you to wear that for 2 weeks up to 4 times a year.   Ask [place] about it the next time you are here.   Keep fishing and take care!  Butch Penny  Diabetes Educator [phone]

## 2018-09-09 ENCOUNTER — Ambulatory Visit: Payer: Medicare HMO | Admitting: Internal Medicine

## 2018-09-29 ENCOUNTER — Ambulatory Visit (INDEPENDENT_AMBULATORY_CARE_PROVIDER_SITE_OTHER): Payer: Medicare HMO | Admitting: Internal Medicine

## 2018-09-29 ENCOUNTER — Other Ambulatory Visit: Payer: Self-pay

## 2018-09-29 VITALS — BP 147/67 | HR 96 | Temp 98.4°F | Wt 162.1 lb

## 2018-09-29 DIAGNOSIS — R809 Proteinuria, unspecified: Secondary | ICD-10-CM

## 2018-09-29 DIAGNOSIS — Z79899 Other long term (current) drug therapy: Secondary | ICD-10-CM

## 2018-09-29 DIAGNOSIS — Z7984 Long term (current) use of oral hypoglycemic drugs: Secondary | ICD-10-CM

## 2018-09-29 DIAGNOSIS — J3089 Other allergic rhinitis: Secondary | ICD-10-CM

## 2018-09-29 DIAGNOSIS — R053 Chronic cough: Secondary | ICD-10-CM

## 2018-09-29 DIAGNOSIS — R05 Cough: Secondary | ICD-10-CM

## 2018-09-29 DIAGNOSIS — J4520 Mild intermittent asthma, uncomplicated: Secondary | ICD-10-CM | POA: Diagnosis not present

## 2018-09-29 DIAGNOSIS — Z7951 Long term (current) use of inhaled steroids: Secondary | ICD-10-CM

## 2018-09-29 DIAGNOSIS — E1129 Type 2 diabetes mellitus with other diabetic kidney complication: Secondary | ICD-10-CM | POA: Diagnosis not present

## 2018-09-29 DIAGNOSIS — I10 Essential (primary) hypertension: Secondary | ICD-10-CM | POA: Diagnosis not present

## 2018-09-29 DIAGNOSIS — J302 Other seasonal allergic rhinitis: Secondary | ICD-10-CM

## 2018-09-29 LAB — POCT GLYCOSYLATED HEMOGLOBIN (HGB A1C): Hemoglobin A1C: 8 % — AB (ref 4.0–5.6)

## 2018-09-29 LAB — GLUCOSE, CAPILLARY: Glucose-Capillary: 85 mg/dL (ref 70–99)

## 2018-09-29 MED ORDER — CETIRIZINE HCL 10 MG PO TABS: 10.0000 mg | ORAL_TABLET | Freq: Every day | ORAL | 0 refills | Status: DC

## 2018-09-29 MED ORDER — TRIAMCINOLONE ACETONIDE 55 MCG/ACT NA AERO: 2.0000 | INHALATION_SPRAY | Freq: Every day | NASAL | 3 refills | Status: DC | PRN

## 2018-09-29 NOTE — Progress Notes (Signed)
   CC: Follow up of HTN, allergies and companion support paperwork  HPI:  Krista Sosa is a 68 y.o.    Past Medical History:  Diagnosis Date  . Allergy   . Asthma, chronic 06/09/2006  . Bilateral cataracts 05/07/2016   s/p left cataract extraction 10/02/2016 and right cataract extraction 10/16/2016  . Bronchiectasis without complication ([place]) 11/23/1599   Mild left lower lobe bronchiectasis seen on CT scan of the thorax in December 2019  . Cataract    bil eyes  . Essential hypertension 02/12/2018  . Gastroesophageal reflux disease 08/13/2011  . Glaucoma    Per patient report  . Hx of adenomatous colonic polyps 11/16/2008  . Hypertension    pt said she is supposed to take med but she is not taking it.  . Hypertensive retinopathy, grade 2, bilateral 05/07/2016  . Latent tuberculosis    dx 03/17/08 - tx with INH X 9 months  . Lichenoid dermatitis 02/13/2012   Previously treated with Triamcinolone 0.025% cream Q8-12H PRN   . Migraine headache 08/06/2018  . Osteoarthritis, generalized 05/21/2006   Diffuse: cervical spine, right shoulder, left wrist Ineffective medications: Tylenol, ASA, Naprosyn, Ibuprofen, Meloxicam    . Overweight (BMI 25.0-29.9) 02/13/2012  . Perennial allergic rhinitis with seasonal variation 06/09/2006  . Persistent microalbuminuria associated with type II diabetes mellitus ([place]) 05/21/2006  . Sickle cell anemia (HCC)    pt has sickle cell trait per pt  . Sickle cell trait ([place]) 02/13/2012  . Tobacco abuse disorder 06/09/2006  . Tubular adenoma of rectum 11/16/2008   5 mm, excised endoscopically 11/16/2008, repeat colonoscopy due July 2015   . Ulnar neuropathy of right upper extremity 02/14/2016  . Urinary tract disease    JICP   Review of Systems:  Reports sinus pressure, sneezing, runny nose, watery eyes, and red eyes. Denies fevers, chills, nausea, vomiting, headaches, chest pain, shortness of breath, or other symptoms  Physical Exam:  Vitals:   09/29/18 1443  BP: (!) 147/67  Pulse: 96  Temp: 98.4 F (36.9 C)  TempSrc: Oral  SpO2: 98%  Weight: 162 lb 1.6 oz (73.5 kg)   Physical Exam  Constitutional: She is well-developed, well-nourished, and in no distress.  HENT:  Head: Normocephalic and atraumatic.  Eyes:  Mildly watery eyes, no discharge, non-tender  Neck: Normal range of motion. Neck supple.  Cardiovascular: Normal rate, regular rhythm and normal heart sounds.  Pulmonary/Chest: Effort normal and breath sounds normal. No respiratory distress.  Abdominal: Soft. Bowel sounds are normal. She exhibits no distension.     Assessment & Plan:   See Encounters Tab for problem based charting.  Patient discussed with Dr. Dareen Piano

## 2018-09-29 NOTE — Patient Instructions (Addendum)
Ms. TANAIA HAWKEY,  It was a pleasure to see you today. Thank you for coming in.   Today we discussed your diabetes, hypertension, and allergies. In regards to this please start taking zyrtec, I have sent in a prescription but you can get this over the counter if you need to, and the nasocort.    Please return to clinic in 3 months or sooner if needed.   Thank you again for coming in.   [name].

## 2018-09-30 LAB — MICROALBUMIN / CREATININE URINE RATIO
Creatinine, Urine: 84.1 mg/dL
Microalb/Creat Ratio: 185 mg/g creat — ABNORMAL HIGH (ref 0–29)
Microalbumin, Urine: 155.5 ug/mL

## 2018-09-30 NOTE — Assessment & Plan Note (Signed)
Currently on Advair 250-50 mcg 1 puff 2 times daily and as needed albuterol. Today she reports that it has been going okay. She reports that has been taking it and using the albuterol every day. She does report that she has been having sinus issues and allergies. Will continue her advair and albuterol and restart allergy medications.   -Continue Advair 250-50 mcg 1 puff BID and PRN albuterol

## 2018-09-30 NOTE — Assessment & Plan Note (Signed)
She is currently on Glucotrol XL 20 mg daily, metformin 1000 mg BID, and Januvia 100 mg daily. Last A1c was 10.6 4 months ago. She reports that she does not check her blood sugars at home, she reports that she is tired of checking it. She reports that last time she was very sick and was not taking any of her medications and that's why her A1c was so elevated. She reported that she has been taking her medications as prescribed now.   Plan: -Check a1c -continue glucotrol XL 20 mg daily, metformin 1000 mg BID, and Januvia 100 mg daily -May consider switching from glucotrol to another oral diabetes medication due to risk of hypoglycemia with sulfonylureas since she does not keep track of her CBGS

## 2018-09-30 NOTE — Assessment & Plan Note (Addendum)
She is currently not on any antihypertensive medications at this time, her lisinopril was discontinued on her last visit due to a chronic cough, she reports that her cough has improved. Blood pressure today is 147/67. We discussed that she will likely need to be restarted on an antihypertensive however we will wait for the microalbumin to see if an ARB would be beneficial.   -Hold off on starting anything at this time -Check microalbumin, if elevated will start Losartan  Addendum: Microalbumin/Cr ratio elevated at 185, contacted patient and informed her of results. Started her on Losartan 25 mg daily and advised her to continue to check her blood pressures at home as she is able to.

## 2018-09-30 NOTE — Assessment & Plan Note (Signed)
Patient reports that she has been having sinus issues, sneezing, runny now, eyes watering, and red eyes for a few weeks. She reports that she has a history of allergies and was using benadryl in the past however she reports that she stopped using it because it made her feel drowsy. She reports that she often gets allergies in the Spring and Summer time. This appears to be a recurrence of her allergies, will restart a second gen antihistamine to decrease risk of sedation. She had completed a 21 day coure of amoxicillin-clavulanic acid 500 mg BID for presumed chronic sinusitis, if she does not improve with restarting her medications can order a CT sinus to evaluate. She was supposed to have had this done however does not appear that she go this.   -Restart Nasocort  -Prescribe zyrtec 10 mg daily

## 2018-09-30 NOTE — Assessment & Plan Note (Signed)
Lisinopril was discontinued on her last visit, she is alos on asthma medciations, PPI, and antihistamine with decongestant. She was started on a Amoxicillin-clauvulanic acid which she has completed. She reports that she is doing a lot better today with the cough. We will continue to hold lisinopril as this may have been the cause of her cough.

## 2018-10-01 MED ORDER — LOSARTAN POTASSIUM 25 MG PO TABS: 25.0000 mg | ORAL_TABLET | Freq: Every day | ORAL | 2 refills | Status: DC

## 2018-10-01 NOTE — Progress Notes (Signed)
Internal Medicine Clinic Attending  Case discussed with Dr. Krienke at the time of the visit.  We reviewed the resident's history and exam and pertinent patient test results.  I agree with the assessment, diagnosis, and plan of care documented in the resident's note.    

## 2018-10-01 NOTE — Addendum Note (Signed)
Addended by: [name] on: 10/01/2018 03:52 PM   Modules accepted: Orders

## 2018-10-08 ENCOUNTER — Telehealth: Payer: Self-pay | Admitting: Pharmacy Technician

## 2018-10-08 DIAGNOSIS — R809 Proteinuria, unspecified: Secondary | ICD-10-CM

## 2018-10-08 DIAGNOSIS — E1129 Type 2 diabetes mellitus with other diabetic kidney complication: Secondary | ICD-10-CM

## 2018-10-11 MED ORDER — EMPAGLIFLOZIN 10 MG PO TABS: 10.0000 mg | ORAL_TABLET | Freq: Every day | ORAL | 11 refills | Status: DC

## 2018-10-11 MED ORDER — SITAGLIPTIN PHOS-METFORMIN HCL 50-1000 MG PO TABS: 1.0000 | ORAL_TABLET | Freq: Two times a day (BID) | ORAL | 11 refills | Status: DC

## 2018-10-11 NOTE — Telephone Encounter (Signed)
Diabetes Management Follow Up Krista Sosa is a 68 y.o. female who was contacted for DM management. Identity was verified using date of birth and address.  Diabetes medications: Januvia, Metformin, Glipizide XL  Last A1c: 8.0% on 09/29/2018 (down from previous 10.5% on 08/05/2017) - Home BG monitoring: none. Patient does "not like needles" and does not regularly check her blood glucose at home because she "feels like a [place] pig" sticking herself. - Hypoglycemia symptoms: "seeing spots" - Hyperglycemia symptoms: "seeing spots"   A/P Blood glucose control: improved  Reviewed appropriate home blood glucose monitoring but patient declined any further home BG monitoring   Interventions today: - Medication: modified regimen to Janumet (for better adherence) and switched glipizide to Jardiance (for CV benefit and to avoid hypoglycemia in setting of blood glucose not being monitored at home) - Patient education on Jardiance adverse effects provided (including risk of UTI), and patient advised to contact clinic if adverse effects occur. Patient also educated to discontinue Januvia, metformin, and glipizide when starting Janumet and Jardiance. - Disease state education: advised patient that adherence to medication regimen will help decrease risk of micro- and macrovascular complications of diabetes, including CV events, retinopathy, nephropathy, and neuropathy.  Advised to contact clinic or seek medical attention if concerns or symptoms arise. The patient verbalized understanding of information provided by repeating back concepts discussed. Will notify PCP of findings  Follow-up - Pharmacy to contact patient in one month to assess medication changes - consider increase in Jardiance to 25mg  by mouth once daily at f/u if additional glycemic control required.  Brendolyn Patty, PharmD PGY1 Pharmacy Resident Phone [phone]  10/11/2018   11:41 AM

## 2018-10-26 ENCOUNTER — Other Ambulatory Visit: Payer: Self-pay | Admitting: Internal Medicine

## 2018-10-26 DIAGNOSIS — J302 Other seasonal allergic rhinitis: Secondary | ICD-10-CM

## 2018-11-04 ENCOUNTER — Encounter: Payer: Medicare HMO | Admitting: Internal Medicine

## 2018-11-12 ENCOUNTER — Telehealth: Payer: Self-pay | Admitting: Pharmacist

## 2018-11-12 NOTE — Progress Notes (Signed)
Diabetes Management Follow Up [name] is a 68 y.o. female who was contacted for DM management. Identity was verified using date of birth and address.  Diabetes medications: Janumet (sitgliptin-metformin), and Jardiance (empagliflozin  1 month ago, we combined sitagliptin with metformin for Janumet (for better adherence) and switched glipizide to Jardiance (for CV benefit and to avoid hypoglycemia in setting of blood glucose not being monitored at home)   Patient reports doing well overall, no symptoms of concern. Unfortunately, she was rushed to end the call so I advised her to contact the clinic if she has any questions or concerns. She has a PCP appointmetn 11/24/18 and we can further evaluate that day. Patient verbalized understanding.   Krista Sosa

## 2018-11-24 ENCOUNTER — Ambulatory Visit (HOSPITAL_COMMUNITY)
Admission: RE | Admit: 2018-11-24 | Discharge: 2018-11-24 | Disposition: A | Discharge status: Home / Self Care | Payer: Medicare HMO | Source: Ambulatory Visit | Attending: Internal Medicine | Admitting: Internal Medicine

## 2018-11-24 ENCOUNTER — Encounter: Payer: Self-pay | Admitting: Internal Medicine

## 2018-11-24 ENCOUNTER — Ambulatory Visit (INDEPENDENT_AMBULATORY_CARE_PROVIDER_SITE_OTHER): Payer: Medicare HMO | Admitting: Internal Medicine

## 2018-11-24 ENCOUNTER — Other Ambulatory Visit: Payer: Self-pay

## 2018-11-24 VITALS — BP 129/63 | HR 90 | Temp 99.0°F | Ht 66.0 in | Wt 164.0 lb

## 2018-11-24 DIAGNOSIS — E1129 Type 2 diabetes mellitus with other diabetic kidney complication: Secondary | ICD-10-CM

## 2018-11-24 DIAGNOSIS — Z7984 Long term (current) use of oral hypoglycemic drugs: Secondary | ICD-10-CM | POA: Diagnosis not present

## 2018-11-24 DIAGNOSIS — J302 Other seasonal allergic rhinitis: Secondary | ICD-10-CM

## 2018-11-24 DIAGNOSIS — R079 Chest pain, unspecified: Secondary | ICD-10-CM | POA: Diagnosis not present

## 2018-11-24 DIAGNOSIS — J4520 Mild intermittent asthma, uncomplicated: Secondary | ICD-10-CM | POA: Diagnosis not present

## 2018-11-24 DIAGNOSIS — Z7951 Long term (current) use of inhaled steroids: Secondary | ICD-10-CM | POA: Diagnosis not present

## 2018-11-24 DIAGNOSIS — F17210 Nicotine dependence, cigarettes, uncomplicated: Secondary | ICD-10-CM

## 2018-11-24 DIAGNOSIS — I10 Essential (primary) hypertension: Secondary | ICD-10-CM | POA: Diagnosis not present

## 2018-11-24 DIAGNOSIS — R809 Proteinuria, unspecified: Secondary | ICD-10-CM | POA: Diagnosis not present

## 2018-11-24 DIAGNOSIS — J3089 Other allergic rhinitis: Secondary | ICD-10-CM | POA: Diagnosis not present

## 2018-11-24 DIAGNOSIS — Z79899 Other long term (current) drug therapy: Secondary | ICD-10-CM

## 2018-11-24 LAB — GLUCOSE, CAPILLARY: Glucose-Capillary: 174 mg/dL — ABNORMAL HIGH (ref 70–99)

## 2018-11-24 MED ORDER — JARDIANCE 25 MG PO TABS: 25.0000 mg | ORAL_TABLET | Freq: Every day | ORAL | 2 refills | Status: DC

## 2018-11-24 NOTE — Assessment & Plan Note (Addendum)
Patient is currently on Janumet 50-1000 mg daily and Jardiance 10 mg daily.  She states that she has some polyuria, polydipsia.  She reports that her appetite is been good.  Denies any abdominal pain, nausea, vomiting, sweating, or numbness and tingling in her hands or feet.  She does check her blood sugars at home and states that it is been around 230, she does not have her meter with her today.  She denies any episodes of hypoglycemia, she does have times where she has lightheadedness and some dizziness that occurs about twice a week, checked her blood sugar as tolerated.  We discussed that since she did seem to still be elevated at her last A1c was elevated at 10 that we may need to go up on her Jardiance.  -Continue Janumet 50-1000 mg twice daily -Increase Jardiance 25 mg daily -Advised patient to bring in meter next time and to check her blood sugars when she has symptoms of lightheadedness and dizziness -RTC in 2 months to repeat A1c

## 2018-11-24 NOTE — Assessment & Plan Note (Signed)
Reports that her zertec was not covered by insurance, so she was taking the over the counter form as needed.  She states that she took about 10-15 pills in the past month and only uses it as needed.  She denies any issues with sinuses at this time.  -Continue over-the-counter Zyrtec as needed -Nasocort as needed

## 2018-11-24 NOTE — Assessment & Plan Note (Signed)
Patient reports an episode of chest pain yesterday, she states that it woke her up from sleep, occurred over her left side, sharp in nature, lasted for few hours.  She did endorse some possible shoulder pain. Pain was nonpleuritic.  She denied any nausea, vomiting, shortness of breath, or palpitations during this time.  She does report that she had a previous episode years ago and had a stress test, she is not sure what the stress test showed but states that she has not had any chest pain since that time.  She denied any strenuous activity or exercise no recent changes in her diet, she does state that she ate Brussels sprouts the night before.  She denies any chest pain at this time.  Patient is hemodynamically stable, she does have tenderness to palpation over the left side, cardiac and pulmonary exam otherwise unremarkable.  Obtained an EKG that showed sinus rhythm, no ST changes noted.  While this appears more musculoskeletal in nature, however given her risk factors for atypical chest pain including female sex, diabetes and hypertension will obtain stress test to cardiac disease.  Plan: -Treadmill stress test -Advised patient to apply heating pad and use OTC biofreeze or bengay over the area

## 2018-11-24 NOTE — Assessment & Plan Note (Signed)
Patient is still on 250-50 mcg 1 puff twice daily as needed albuterol.  She reports that her breathing has been doing well today.  She only uses her albuterol about once a week.  On exam she has no wheezing.  Continue Advair 250-50 mcg 1 puff twice daily Continue albuterol PRN

## 2018-11-24 NOTE — Assessment & Plan Note (Signed)
Patient is currently on losartan 25 mg daily, more for her elevated urine microalbumin creatinine ratio.  Blood pressure today was 138/114, repeat 129/63.  She reports that she has not been taking her medications for the past 4 days because she ran out, she recently picked it up but has not started taking it again.  Denied any other issues taking her medications.  -Continue losartan 25 mg daily

## 2018-11-24 NOTE — Progress Notes (Signed)
CC: Chest pain, HTN, and diabetes  HPI:  [name] is a 68 y.o.  with a PMH listed below presenting for chest pain, HTN, and diabetes   Please see A&P for status of the patient's chronic medical conditions  Past Medical History:  Diagnosis Date  . Allergy   . Asthma, chronic 06/09/2006  . Bilateral cataracts 05/07/2016   s/p left cataract extraction 10/02/2016 and right cataract extraction 10/16/2016  . Bronchiectasis without complication ([place]) 56/21/3086   Mild left lower lobe bronchiectasis seen on CT scan of the thorax in December 2019  . Cataract    bil eyes  . Essential hypertension 02/12/2018  . Gastroesophageal reflux disease 08/13/2011  . Glaucoma    Per patient report  . Hx of adenomatous colonic polyps 11/16/2008  . Hypertension    pt said she is supposed to take med but she is not taking it.  . Hypertensive retinopathy, grade 2, bilateral 05/07/2016  . Latent tuberculosis    dx 03/17/08 - tx with INH X 9 months  . Lichenoid dermatitis 02/13/2012   Previously treated with Triamcinolone 0.025% cream Q8-12H PRN   . Migraine headache 08/06/2018  . Osteoarthritis, generalized 05/21/2006   Diffuse: cervical spine, right shoulder, left wrist Ineffective medications: Tylenol, ASA, Naprosyn, Ibuprofen, Meloxicam    . Overweight (BMI 25.0-29.9) 02/13/2012  . Perennial allergic rhinitis with seasonal variation 06/09/2006  . Persistent microalbuminuria associated with type II diabetes mellitus ([place]) 05/21/2006  . Sickle cell anemia (HCC)    pt has sickle cell trait per pt  . Sickle cell trait ([place]) 02/13/2012  . Tobacco abuse disorder 06/09/2006  . Tubular adenoma of rectum 11/16/2008   5 mm, excised endoscopically 11/16/2008, repeat colonoscopy due July 2015   . Ulnar neuropathy of right upper extremity 02/14/2016  . Urinary tract disease    JICP   Review of Systems: Refer to history of present illness and assessment and plans for pertinent review of systems, all others  reviewed and negative.  Physical Exam:  Vitals:   11/24/18 1348 11/24/18 1420  BP: (!) 138/114 129/63  Pulse: 90   Temp: 99 F (37.2 C)   TempSrc: Oral   SpO2: 100%   Weight: 164 lb (74.4 kg)   Height: 5\' 6"  (1.676 m)     Physical Exam  Constitutional: She is oriented to person, place, and time and well-developed, well-nourished, and in no distress.  Eyes: Pupils are equal, round, and reactive to light. Conjunctivae and EOM are normal.  Neck: Normal range of motion. Neck supple.  Cardiovascular: Normal rate, regular rhythm and normal heart sounds.  Pulmonary/Chest: Effort normal and breath sounds normal. No respiratory distress.  Abdominal: Soft. Bowel sounds are normal. She exhibits no distension.  Musculoskeletal: Normal range of motion.     Comments: TTP over left chest area  Neurological: She is alert and oriented to person, place, and time.  Skin: Skin is warm and dry.  Psychiatric: Mood and affect normal.    Social History   Socioeconomic History  . Marital status: Single    Spouse name: Not on file  . Number of children: Not on file  . Years of education: Not on file  . Highest education level: Not on file  Occupational History  . Not on file  Social Needs  . Financial resource strain: Not on file  . Food insecurity    Worry: Not on file    Inability: Not on file  . Transportation needs  Medical: Not on file    Non-medical: Not on file  Tobacco Use  . Smoking status: Current Every Day Smoker    Packs/day: 0.50    Years: 25.00    Pack years: 12.50    Types: Cigarettes  . Smokeless tobacco: Never Used  Substance and Sexual Activity  . Alcohol use: Yes    Alcohol/week: 0.0 standard drinks    Comment: occasionally  . Drug use: No    Comment: positive marijuana metabolite in 2010  . Sexual activity: Never    Birth control/protection: Other-see comments    Comment: s/p tubal ligation  Lifestyle  . Physical activity    Days per week: Not on file     Minutes per session: Not on file  . Stress: Not on file  Relationships  . Social Herbalist on phone: Not on file    Gets together: Not on file    Attends religious service: Not on file    Active member of club or organization: Not on file    Attends meetings of clubs or organizations: Not on file    Relationship status: Not on file  . Intimate partner violence    Fear of current or ex partner: Not on file    Emotionally abused: Not on file    Physically abused: Not on file    Forced sexual activity: Not on file  Other Topics Concern  . Not on file  Social History Narrative  . Not on file    Family History  Problem Relation Age of Onset  . Diabetes Mother   . Heart disease Mother 25  . Colon cancer Mother   . Cancer Father   . Sickle cell anemia Sister   . Sickle cell anemia Brother   . Diabetes Sister   . Brain cancer Sister   . Lung cancer Brother   . Healthy Sister   . Healthy Sister   . Healthy Sister   . Healthy Daughter   . Healthy Son   . Healthy Daughter   . Healthy Daughter   . Healthy Son   . Wilson's disease Maternal Uncle   . Breast cancer Maternal Aunt   . Breast cancer Other   . Esophageal cancer Neg Hx   . Rectal cancer Neg Hx   . Stomach cancer Neg Hx     Assessment & Plan:   See Encounters Tab for problem based charting.  Patient discussed with Dr. Dareen Piano

## 2018-11-24 NOTE — Patient Instructions (Addendum)
[name],  It was a pleasure to see you today. Thank you for coming in.   Today we discussed your chest pain, hypertension, diabetes and allergies.  Your blood pressure looks great today! Continue taking the losartan. For your diabetes, please increase the Jardiance to 25 mg daily.  Continue your other medications as prescribed.  Continue checking your blood sugars at home.    In regards to your chest pain, We have checked an EKG and it did not show any acute findings. Given your history and risk factors I would like to get a stress test to make sure that there is nothing else going on.  It's possible that this is just related to some muscle pain, you can use heating pads and over the counter gels like biofreeze or bengay for the pain.    Please return to clinic in 2 months or sooner if needed.   Thank you again for coming in.   [name].

## 2018-11-25 NOTE — Progress Notes (Signed)
Internal Medicine Clinic Attending  Case discussed with Dr. Krienke at the time of the visit.  We reviewed the resident's history and exam and pertinent patient test results.  I agree with the assessment, diagnosis, and plan of care documented in the resident's note.    

## 2018-12-03 ENCOUNTER — Telehealth: Payer: Self-pay | Admitting: Pharmacist

## 2018-12-14 NOTE — Progress Notes (Signed)
Tried contacting patient for follow up diabetes medication management support. Unable to reach.

## 2018-12-30 ENCOUNTER — Other Ambulatory Visit: Payer: Self-pay

## 2018-12-30 ENCOUNTER — Ambulatory Visit (INDEPENDENT_AMBULATORY_CARE_PROVIDER_SITE_OTHER): Payer: Medicare HMO | Admitting: Internal Medicine

## 2018-12-30 VITALS — BP 141/77 | HR 83 | Temp 98.1°F | Ht 67.0 in | Wt 164.3 lb

## 2018-12-30 DIAGNOSIS — M67912 Unspecified disorder of synovium and tendon, left shoulder: Secondary | ICD-10-CM

## 2018-12-30 MED ORDER — DICLOFENAC SODIUM 1 % TD GEL: 4.0000 g | Freq: Four times a day (QID) | TRANSDERMAL | 3 refills | Status: DC

## 2018-12-30 NOTE — Progress Notes (Signed)
   CC: shoulder pain   HPI:  Krista Sosa is a 68 y.o. female with PMHx listed below who presents for acute left shoulder pain. Pain began approximately 1 week ago. No falls or trauma at symptom onset. She describes pain as constant, deep ache that radiates into her neck. Worse with movement, particularly when trying to lift her arm. She has a difficult time sleeping at night due to pain. She has been taking Ibuprofen with minimal relief.   Past Medical History:  Diagnosis Date  . Allergy   . Asthma, chronic 06/09/2006  . Bilateral cataracts 05/07/2016   s/p left cataract extraction 10/02/2016 and right cataract extraction 10/16/2016  . Bronchiectasis without complication ([place]) 74/[id number]   Mild left lower lobe bronchiectasis seen on CT scan of the thorax in December 2019  . Cataract    bil eyes  . Essential hypertension 02/12/2018  . Gastroesophageal reflux disease 08/13/2011  . Glaucoma    Per patient report  . Hx of adenomatous colonic polyps 11/16/2008  . Hypertension    pt said she is supposed to take med but she is not taking it.  . Hypertensive retinopathy, grade 2, bilateral 05/07/2016  . Latent tuberculosis    dx 03/17/08 - tx with INH X 9 months  . Lichenoid dermatitis 02/13/2012   Previously treated with Triamcinolone 0.025% cream Q8-12H PRN   . Migraine headache 08/06/2018  . Osteoarthritis, generalized 05/21/2006   Diffuse: cervical spine, right shoulder, left wrist Ineffective medications: Tylenol, ASA, Naprosyn, Ibuprofen, Meloxicam    . Overweight (BMI 25.0-29.9) 02/13/2012  . Perennial allergic rhinitis with seasonal variation 06/09/2006  . Persistent microalbuminuria associated with type II diabetes mellitus ([place]) 05/21/2006  . Sickle cell anemia ([facility])    pt has sickle cell trait per pt  . Sickle cell trait ([place]) 02/13/2012  . Tobacco abuse disorder 06/09/2006  . Tubular adenoma of rectum 11/16/2008   5 mm, excised endoscopically 11/16/2008, repeat colonoscopy due  July 2015   . Ulnar neuropathy of right upper extremity 02/14/2016  . Urinary tract disease    JICP   Review of Systems:   Constitutional: negative for fevers, chills MSK: negative for other joint pain or swelling Skin: no rash or skin changes  Neuro: negative for numbness/tingling   Physical Exam:  Vitals:   12/30/18 1440  BP: (!) 141/77  Pulse: 83  Temp: 98.1 F (36.7 C)  TempSrc: Oral  SpO2: 100%  Weight: 164 lb 4.8 oz (74.5 kg)  Height: 5\' 7"  (1.702 m)   General: alert, well-appearing, NAD MSK: left shoulder without deformity or effusion. No overlying skin changes. TTP along supraspinatus and infraspinatus, as well as into trapezius. Active ROM reduced most notably in abduction and flexion. Passive ROM intact. Positive empty can test.  Neuro: A&Ox3, sensation intact throughout.  Skin: no erythema, rashes or skin breakdown  Assessment & Plan:   See Encounters Tab for problem based charting.  Patient discussed with Dr. Evette Doffing

## 2018-12-30 NOTE — Patient Instructions (Signed)
Krista Sosa, You were treated for your left shoulder pain which is likely rotator cuff tendinopathy. We injected a combination of steroid and numbing medication into the area to hopefully calm down the inflammation. Remember it may be a little more sore tonight. You can continue using Ibuprofen, ice/heat as needed.   I've sent the voltaren gel to your pharmacy to use as well.   Take care, Dr. Koleen Distance

## 2019-01-02 ENCOUNTER — Encounter: Payer: Self-pay | Admitting: Internal Medicine

## 2019-01-02 DIAGNOSIS — M5410 Radiculopathy, site unspecified: Secondary | ICD-10-CM | POA: Insufficient documentation

## 2019-01-02 DIAGNOSIS — M5412 Radiculopathy, cervical region: Secondary | ICD-10-CM | POA: Insufficient documentation

## 2019-01-02 HISTORY — DX: Radiculopathy, site unspecified: M54.10

## 2019-01-02 NOTE — Assessment & Plan Note (Signed)
Patient's symptoms and physical exam findings consistent with rotator cuff tendinopathy. Visualization with ultrasound did not reveal any rotator cuff tear or effusion. Due to significant pain and impaired ROM, steroid injection was offered. Risks and benefits discussed and patient elected to proceed. Hopefully will give her some relief since she has had success in the past with previous steroid injections.  Follow-up with PCP as scheduled or sooner if shoulder pain does not improve.

## 2019-01-02 NOTE — Progress Notes (Addendum)
Shoulder Injection Procedure Note  Pre-operative Diagnosis: left rotator cuff tendinopathy  Indications: symptomatic relief of rotator cuff tendinopathy  Anesthesia: Lidocaine 1% with epinephrine without added sodium bicarbonate  Procedure Details   Point of care ultrasound was used to identify the subacromial bursa and evaluate for rotator cuff tendinopathy or tears. Consent was obtained for the procedure. The shoulder was prepped with iodine. Using a 22 gauge needle the subacromial bursa was injected with 2 mL 1% lidocaine and 1 mL of triamcinolone (KENALOG) 40mg /ml. The needle was removed and a dressing was applied.  Complications:  None; patient tolerated the procedure well.

## 2019-01-03 ENCOUNTER — Other Ambulatory Visit: Payer: Self-pay

## 2019-01-03 ENCOUNTER — Ambulatory Visit (INDEPENDENT_AMBULATORY_CARE_PROVIDER_SITE_OTHER): Payer: Medicare HMO | Admitting: Internal Medicine

## 2019-01-03 ENCOUNTER — Encounter: Payer: Self-pay | Admitting: Internal Medicine

## 2019-01-03 VITALS — BP 129/67 | HR 83 | Temp 98.4°F | Ht 67.0 in | Wt 160.7 lb

## 2019-01-03 DIAGNOSIS — F17210 Nicotine dependence, cigarettes, uncomplicated: Secondary | ICD-10-CM | POA: Diagnosis not present

## 2019-01-03 DIAGNOSIS — F330 Major depressive disorder, recurrent, mild: Secondary | ICD-10-CM | POA: Diagnosis not present

## 2019-01-03 DIAGNOSIS — Q178 Other specified congenital malformations of ear: Secondary | ICD-10-CM

## 2019-01-03 DIAGNOSIS — E1129 Type 2 diabetes mellitus with other diabetic kidney complication: Secondary | ICD-10-CM

## 2019-01-03 DIAGNOSIS — I10 Essential (primary) hypertension: Secondary | ICD-10-CM

## 2019-01-03 DIAGNOSIS — Z79899 Other long term (current) drug therapy: Secondary | ICD-10-CM

## 2019-01-03 DIAGNOSIS — R809 Proteinuria, unspecified: Secondary | ICD-10-CM | POA: Diagnosis not present

## 2019-01-03 DIAGNOSIS — H61893 Other specified disorders of external ear, bilateral: Secondary | ICD-10-CM | POA: Diagnosis not present

## 2019-01-03 DIAGNOSIS — M67912 Unspecified disorder of synovium and tendon, left shoulder: Secondary | ICD-10-CM

## 2019-01-03 DIAGNOSIS — M75102 Unspecified rotator cuff tear or rupture of left shoulder, not specified as traumatic: Secondary | ICD-10-CM

## 2019-01-03 DIAGNOSIS — Z7984 Long term (current) use of oral hypoglycemic drugs: Secondary | ICD-10-CM | POA: Diagnosis not present

## 2019-01-03 DIAGNOSIS — Z0000 Encounter for general adult medical examination without abnormal findings: Secondary | ICD-10-CM

## 2019-01-03 LAB — GLUCOSE, CAPILLARY: Glucose-Capillary: 205 mg/dL — ABNORMAL HIGH (ref 70–99)

## 2019-01-03 LAB — POCT GLYCOSYLATED HEMOGLOBIN (HGB A1C): Hemoglobin A1C: 9.2 % — AB (ref 4.0–5.6)

## 2019-01-03 MED ORDER — GLUCOSE BLOOD VI STRP: ORAL_STRIP | 12 refills | Status: DC

## 2019-01-03 MED ORDER — BLOOD GLUCOSE MONITORING SUPPL DEVI: 1.0000 | Freq: Every day | 0 refills | Status: DC

## 2019-01-03 MED ORDER — TRUE METRIX AIR GLUCOSE METER W/DEVICE KIT: PACK | 0 refills | Status: DC

## 2019-01-03 NOTE — Progress Notes (Signed)
CC: Hypertension, DM, bilateral ear lobe splitting, and follow up of tendinopathy  HPI: Krista Sosa is a 68 y.o.  with a PMH listed below presenting for hypertension, DM, bilateral ear lobe splitting, and follow up of left shoulder tendinopathy.   Please see A&P for status of the patient's chronic medical conditions  Past Medical History:  Diagnosis Date  . Allergy   . Asthma, chronic 06/09/2006  . Bilateral cataracts 05/07/2016   s/p left cataract extraction 10/02/2016 and right cataract extraction 10/16/2016  . Bronchiectasis without complication ([place]) 33/29/5188   Mild left lower lobe bronchiectasis seen on CT scan of the thorax in December 2019  . Cataract    bil eyes  . Essential hypertension 02/12/2018  . Gastroesophageal reflux disease 08/13/2011  . Glaucoma    Per patient report  . Hx of adenomatous colonic polyps 11/16/2008  . Hypertension    pt said she is supposed to take med but she is not taking it.  . Hypertensive retinopathy, grade 2, bilateral 05/07/2016  . Latent tuberculosis    dx 03/17/08 - tx with INH X 9 months  . Lichenoid dermatitis 02/13/2012   Previously treated with Triamcinolone 0.025% cream Q8-12H PRN   . Migraine headache 08/06/2018  . Osteoarthritis, generalized 05/21/2006   Diffuse: cervical spine, right shoulder, left wrist Ineffective medications: Tylenol, ASA, Naprosyn, Ibuprofen, Meloxicam    . Overweight (BMI 25.0-29.9) 02/13/2012  . Perennial allergic rhinitis with seasonal variation 06/09/2006  . Persistent microalbuminuria associated with type II diabetes mellitus ([place]) 05/21/2006  . Sickle cell anemia (HCC)    pt has sickle cell trait per pt  . Sickle cell trait ([place]) 02/13/2012  . Tobacco abuse disorder 06/09/2006  . Tubular adenoma of rectum 11/16/2008   5 mm, excised endoscopically 11/16/2008, repeat colonoscopy due July 2015   . Ulnar neuropathy of right upper extremity 02/14/2016  . Urinary tract disease    JICP   Review of  Systems: Refer to history of present illness and assessment and plans for pertinent review of systems, all others reviewed and negative.  Physical Exam:  Vitals:   01/03/19 1333 01/03/19 1419  BP: 129/67   Pulse: 83   Temp: 98.4 F (36.9 C)   TempSrc: Oral   SpO2: 100%   Weight: 160 lb 11.2 oz (72.9 kg)   Height:  5\' 7"  (1.702 m)    Physical Exam  Constitutional: She is oriented to person, place, and time and well-developed, well-nourished, and in no distress.  HENT:  Head: Normocephalic.  Bilateral ear lobes splitting at inferior aspect of ear  Cardiovascular: Normal rate, regular rhythm and normal heart sounds.  Pulmonary/Chest: Effort normal and breath sounds normal. No respiratory distress.  Abdominal: Soft. Bowel sounds are normal. She exhibits no distension.  Musculoskeletal:        General: No edema.  Neurological: She is alert and oriented to person, place, and time.  Skin: Skin is warm and dry.  Psychiatric: Mood and affect normal.        Social History   Socioeconomic History  . Marital status: Single    Spouse name: Not on file  . Number of children: Not on file  . Years of education: Not on file  . Highest education level: Not on file  Occupational History  . Not on file  Social Needs  . Financial resource strain: Not on file  . Food insecurity    Worry: Not on file    Inability: Not on file  .  Transportation needs    Medical: Not on file    Non-medical: Not on file  Tobacco Use  . Smoking status: Current Every Day Smoker    Packs/day: 0.50    Years: 25.00    Pack years: 12.50    Types: Cigarettes  . Smokeless tobacco: Never Used  Substance and Sexual Activity  . Alcohol use: Yes    Alcohol/week: 0.0 standard drinks    Comment: occasionally  . Drug use: No    Comment: positive marijuana metabolite in 2010  . Sexual activity: Never    Birth control/protection: Other-see comments    Comment: s/p tubal ligation  Lifestyle  . Physical  activity    Days per week: Not on file    Minutes per session: Not on file  . Stress: Not on file  Relationships  . Social Herbalist on phone: Not on file    Gets together: Not on file    Attends religious service: Not on file    Active member of club or organization: Not on file    Attends meetings of clubs or organizations: Not on file    Relationship status: Not on file  . Intimate partner violence    Fear of current or ex partner: Not on file    Emotionally abused: Not on file    Physically abused: Not on file    Forced sexual activity: Not on file  Other Topics Concern  . Not on file  Social History Narrative  . Not on file    Family History  Problem Relation Age of Onset  . Diabetes Mother   . Heart disease Mother 32  . Colon cancer Mother   . Cancer Father   . Sickle cell anemia Sister   . Sickle cell anemia Brother   . Diabetes Sister   . Brain cancer Sister   . Lung cancer Brother   . Healthy Sister   . Healthy Sister   . Healthy Sister   . Healthy Daughter   . Healthy Son   . Healthy Daughter   . Healthy Daughter   . Healthy Son   . Wilson's disease Maternal Uncle   . Breast cancer Maternal Aunt   . Breast cancer Other   . Esophageal cancer Neg Hx   . Rectal cancer Neg Hx   . Stomach cancer Neg Hx     Assessment & Plan:   See Encounters Tab for problem based charting.  Patient discussed with Dr. Rebeca Alert

## 2019-01-03 NOTE — Assessment & Plan Note (Signed)
Patient is currently on losartan 25 mg daily, she denies any issues taking her medications.  Blood pressure today is well controlled.  She denies any chest pain, lightheadedness, dizziness, shortness of breath, or other symptoms.  Plan -Continue losartan 25 mg daily

## 2019-01-03 NOTE — Assessment & Plan Note (Addendum)
Patient is currently on Janumet 50-1000 mg twice daily and Jardiance 25 mg daily.  She states that she has not been checking her blood sugars for the past month due to running out of her strips, she normally will check it twice per day.  She still reports some polyuria and polydipsia, appetite has been good.  She is currently using the One Touch system, states that the prescription interventions sent is not working.    Obtained an A1c today that was 9.2, elevated her last A1c of 8.  We discussed making adjustments to her medication or adding medications however she reported that she wants to work on her diet and exercise prior to making adjustments.  She states that she does not eat too much sugars or sweets however does state that she drinks about 3 sodas per day which is what she is going to be working on.  Patient reports that she has an eye exam scheduled for this month.  Plan - Continue Janumet 50-1000 mg twice daily -Continue Jardiance 25 mg daily -Send in prescription for glucose test strips, and a new prescription for meter -Return to clinic in 1 month to follow-up, if CBGs are still elevated may need to add additional medications or discuss starting insulin

## 2019-01-03 NOTE — Patient Instructions (Signed)
[name],  It was a pleasure to see you today. Thank you for coming in.   Today we discussed your diabetes. Your blood sugars and A1c are still pretty high today, we discussed working on your diet and decreasing your sugar intake for now however if your blood sugars remain elevated we may need to start you on another diabetes medication or start insulin soon. Please start checking your blood sugars at least twice a day.    Your blood pressure looks great today! Continue using the losartan daily.   We also discussed your ear lobes. I have placed a referral to plastic surgery.  I have placed an order for the bone scan, they will contact you to set this up.   Please return to clinic in 1 month or sooner if needed.   Thank you again for coming in.   [name].

## 2019-01-03 NOTE — Progress Notes (Signed)
Internal Medicine Clinic Attending  I saw and evaluated the patient.  I personally confirmed the key portions of the history and exam documented by Dr. Koleen Distance and I reviewed pertinent patient test results.  The assessment, diagnosis, and plan were formulated together and I agree with the documentation in the resident's note. I was present for the entirety of the procedure.

## 2019-01-04 DIAGNOSIS — Q178 Other specified congenital malformations of ear: Secondary | ICD-10-CM

## 2019-01-04 HISTORY — DX: Other specified congenital malformations of ear: Q17.8

## 2019-01-04 NOTE — Assessment & Plan Note (Signed)
Patient is currently on sertraline 200 mg daily, she denies any issues taking this medication.  Appears to be a chronic and stable issue.

## 2019-01-04 NOTE — Assessment & Plan Note (Addendum)
Patient received steroid injection on 12/30/2018 for left rotator cuff tendinopathy.  She reports that she still has some pain in the area but it is significantly better, is currently a 4-5 out of 10 that is worse at night.  She has been taking ibuprofen 800 mg as needed and has been prescription for Voltaren gel ready to be picked up.  She denies any weakness, numbness, tingling, other issues at this time.  No significant findings on exam.  -Advised patient to continue over-the-counter ibuprofen as needed -Continue Voltaren gel

## 2019-01-04 NOTE — Assessment & Plan Note (Signed)
Patient reports that a few months ago she noticed splitting of her earlobes that she thinks is due to her earrings.  She has not had any issues with this in the past.  She denied any ear pain pain, draining, open wound, redness, or swelling in the area.  On exam there is a significant separation of the inferior portion of bilateral ear lobes, see picture.  Unclear if this able to be corrected however will place referral for plastic surgery to evaluate.  -Plastic surgery referral

## 2019-01-04 NOTE — Assessment & Plan Note (Signed)
Discussed obtaining Pap smear and patient is not interested at this time.  Discussed obtaining DEXA scan and patient was interested in this, placed order for DEXA scan.

## 2019-01-06 NOTE — Progress Notes (Signed)
Internal Medicine Clinic Attending  Case discussed with Dr. Krienke at the time of the visit.  We reviewed the resident's history and exam and pertinent patient test results.  I agree with the assessment, diagnosis, and plan of care documented in the resident's note.  Alexander Raines, M.D., Ph.D.  

## 2019-01-07 ENCOUNTER — Encounter: Payer: Self-pay | Admitting: Internal Medicine

## 2019-01-07 ENCOUNTER — Other Ambulatory Visit: Payer: Self-pay

## 2019-01-07 ENCOUNTER — Ambulatory Visit (INDEPENDENT_AMBULATORY_CARE_PROVIDER_SITE_OTHER): Payer: Medicare HMO | Admitting: Internal Medicine

## 2019-01-07 DIAGNOSIS — Z0000 Encounter for general adult medical examination without abnormal findings: Secondary | ICD-10-CM | POA: Diagnosis not present

## 2019-01-07 NOTE — Progress Notes (Signed)
Internal Medicine Clinic Attending  Case discussed with Dr. Eileen Stanford  soon after the resident saw the patient.  We reviewed the resident's history and exam and pertinent patient test results.  I agree with the assessment, diagnosis, and plan of care documented in the resident's note.

## 2019-01-07 NOTE — Patient Instructions (Addendum)
Annual Wellness Visit   Medicare Covered Preventative Screenings and Services  Services & Screenings Men and Women Who How Often Need? Date of Last Service Action  Abdominal Aortic Aneurysm Adults with AAA risk factors Once     Alcohol Misuse and Counseling All Adults Screening once a year if no alcohol misuse. Counseling up to 4 face to face sessions.     Bone Density Measurement  Adults at risk for osteoporosis Once every 2 yrs Yes  Please call [facility] at [phone] to schedule.  Lipid Panel Z13.6 All adults without CV disease Once every 5 yrs Yes    Colorectal Cancer   Stool sample or  Colonoscopy All adults 39 and older   Once every year  Every 10 years     Depression All Adults Once a year  Today   Diabetes Screening Blood glucose, post glucose load, or GTT Z13.1  All adults at risk  Pre-diabetics  Once per year  Twice per year     A1C 9.2 on 01/03/2019  Diabetes  Self-Management Training All adults Diabetics 10 hrs first year; 2 hours subsequent years. Requires Copay     Glaucoma  Diabetics  Family history of glaucoma  African Americans 2 yrs +  Hispanic Americans 44 yrs + Annually - requires coppay Yes   Keep appt with eye doctor in Sept. Ask him to fax report to [place] at [phone]   Hepatitis C Z72.89 or F19.20  High Risk for HCV  Born between 1945 and 1965  Annually  Once     HIV Z11.4 All adults based on risk  Annually btw ages 34 & 85 regardless of risk  Annually > 65 yrs if at increased risk     Lung Cancer Screening Asymptomatic adults aged 38-77 with 30 pack yr history and current smoker OR quit within the last 15 yrs Annually Must have counseling and shared decision making documentation before first screen     Medical Nutrition Therapy Adults with   Diabetes  Renal disease  Kidney transplant within past 3 yrs 3 hours first year; 2 hours subsequent years     Obesity and Counseling All adults Screening once a year Counseling if  BMI 30 or higher  Today   Tobacco Use Counseling Adults who use tobacco  Up to 8 visits in one year Yes  1-800-QUIT-NOW when you're ready  Vaccines Z23  Hepatitis B  Influenza   Pneumonia  Adults   Once  Once every flu season  Two different vaccines separated by one year    Flu vaccine starting September 1  Next Annual Wellness Visit People with Medicare Every year  Today     Services & Screenings Women Who How Often Need  Date of Last Service Action  Mammogram  Z12.31 Women over 60 One baseline ages 59-39. Annually ager 40 yrs+     Pap tests All women Annually if high risk. Every 2 yrs for normal risk women Yes  Schedule with Dr. Sherry Ruffing  Screening for cervical cancer with   Pap (Z01.419 nl or Z01.411abnl) &  HPV Z11.51 Women aged 51 to 10 Once every 5 yrs     Screening pelvic and breast exams All women Annually if high risk. Every 2 yrs for normal risk women     Sexually Transmitted Diseases  Chlamydia  Gonorrhea  Syphilis All at risk adults Annually for non pregnant females at increased risk         [facility] Men  Who How Ofter Need  Date of Last Service Action  Prostate Cancer - DRE & PSA Men over 50 Annually.  DRE might require a copay.     Sexually Transmitted Diseases  Syphilis All at risk adults Annually for men at increased risk         Things That May Be Affecting Your Health:  Alcohol  Hearing loss  Pain    Depression  Home Safety  Sexual Health  X Diabetes  Lack of physical activity  Stress   Difficulty with daily activities  Loneliness  Tiredness   Drug use X Medicines X Tobacco use   Falls  Motor Vehicle Safety  Weight   Food choices  Oral Health  Other    YOUR PERSONALIZED HEALTH PLAN : 1. Schedule your next subsequent Medicare Wellness visit in one year 2. Attend all of your regular appointments to address your medical issues 3. Complete the preventative screenings and services 4. Call [facility] at [phone] to  schedule Dexa Scan 5. Keep appt with eye doctor in Sept. Ask him to fax report to [place] at [phone]. 6. Schedule to have your pap smear with Dr. Sherry Ruffing 7. Call 1-800-QUIT-NOW when you're ready to quit smoking 8. Congratulations on your goal to begin walking 30 minutes per day 3 times per week!! 9. Begin seated and standing exercises with exercise band.   Bone Density Test The bone density test uses a special type of X-ray to measure the amount of calcium and other minerals in your bones. It can measure bone density in the hip and the spine. The test procedure is similar to having a regular X-ray. This test may also be called:  Bone densitometry.  Bone mineral density test.  Dual-energy X-ray absorptiometry (DEXA). You may have this test to:  Diagnose a condition that causes weak or thin bones (osteoporosis).  Screen you for osteoporosis.  Predict your risk for a broken bone (fracture).  Determine how well your osteoporosis treatment is working. Tell a health care provider about:  Any allergies you have.  All medicines you are taking, including vitamins, herbs, eye drops, creams, and over-the-counter medicines.  Any problems you or family members have had with anesthetic medicines.  Any blood disorders you have.  Any surgeries you have had.  Any medical conditions you have.  Whether you are pregnant or may be pregnant.  Any medical tests you have had within the past 14 days that used contrast material. What are the risks? Generally, this is a safe procedure. However, it does expose you to a small amount of radiation, which can slightly increase your cancer risk. What happens before the procedure?  Do not take any calcium supplements starting 24 hours before your test.  Remove all metal jewelry, eyeglasses, dental appliances, and any other metal objects. What happens during the procedure?   You will lie down on an exam table. There will be an X-ray generator below  you and an imaging device above you.  Other devices, such as boxes or braces, may be used to position your body properly for the scan.  The machine will slowly scan your body. You will need to keep still.  The images will show up on a screen in the room. Images will be examined by a specialist after your test is done. The procedure may vary among health care providers and hospitals. What happens after the procedure?  It is up to you to get your test results. Ask your health care  provider, or the department that is doing the test, when your results will be ready. Summary  A bone density test is an imaging test that uses a type of X-ray to measure the amount of calcium and other minerals in your bones.  The test may be used to diagnose or screen you for a condition that causes weak or thin bones (osteoporosis), predict your risk for a broken bone (fracture), or determine how well your osteoporosis treatment is working.  Do not take any calcium supplements starting 24 hours before your test.  Ask your health care provider, or the department that is doing the test, when your results will be ready. This information is not intended to replace advice given to you by your health care provider. Make sure you discuss any questions you have with your health care provider. Document Released: 06/03/2004 Document Revised: 05/28/2017 Document Reviewed: 03/16/2017 Elsevier Patient Education  2020 [place] Prevention in the Home, Adult Falls can cause injuries. They can happen to people of all ages. There are many things you can do to make your home safe and to help prevent falls. Ask for help when making these changes, if needed. What actions can I take to prevent falls? General Instructions  Use good lighting in all rooms. Replace any light bulbs that burn out.  Turn on the lights when you go into a dark area. Use night-lights.  Keep items that you use often in easy-to-reach places.  Lower the shelves around your home if necessary.  Set up your furniture so you have a clear path. Avoid moving your furniture around.  Do not have throw rugs and other things on the floor that can make you trip.  Avoid walking on wet floors.  If any of your floors are uneven, fix them.  Add color or contrast paint or tape to clearly mark and help you see: ? Any grab bars or handrails. ? First and last steps of stairways. ? Where the edge of each step is.  If you use a stepladder: ? Make sure that it is fully opened. Do not climb a closed stepladder. ? Make sure that both sides of the stepladder are locked into place. ? Ask someone to hold the stepladder for you while you use it.  If there are any pets around you, be aware of where they are. What can I do in the bathroom?      Keep the floor dry. Clean up any water that spills onto the floor as soon as it happens.  Remove soap buildup in the tub or shower regularly.  Use non-skid mats or decals on the floor of the tub or shower.  Attach bath mats securely with double-sided, non-slip rug tape.  If you need to sit down in the shower, use a plastic, non-slip stool.  Install grab bars by the toilet and in the tub and shower. Do not use towel bars as grab bars. What can I do in the bedroom?  Make sure that you have a light by your bed that is easy to reach.  Do not use any sheets or blankets that are too big for your bed. They should not hang down onto the floor.  Have a firm chair that has side arms. You can use this for support while you get dressed. What can I do in the kitchen?  Clean up any spills right away.  If you need to reach something above you, use a strong step stool  that has a grab bar.  Keep electrical cords out of the way.  Do not use floor polish or wax that makes floors slippery. If you must use wax, use non-skid floor wax. What can I do with my stairs?  Do not leave any items on the stairs.  Make  sure that you have a light switch at the top of the stairs and the bottom of the stairs. If you do not have them, ask someone to add them for you.  Make sure that there are handrails on both sides of the stairs, and use them. Fix handrails that are broken or loose. Make sure that handrails are as long as the stairways.  Install non-slip stair treads on all stairs in your home.  Avoid having throw rugs at the top or bottom of the stairs. If you do have throw rugs, attach them to the floor with carpet tape.  Choose a carpet that does not hide the edge of the steps on the stairway.  Check any carpeting to make sure that it is firmly attached to the stairs. Fix any carpet that is loose or worn. What can I do on the outside of my home?  Use bright outdoor lighting.  Regularly fix the edges of walkways and driveways and fix any cracks.  Remove anything that might make you trip as you walk through a door, such as a raised step or threshold.  Trim any bushes or trees on the path to your home.  Regularly check to see if handrails are loose or broken. Make sure that both sides of any steps have handrails.  Install guardrails along the edges of any raised decks and porches.  Clear walking paths of anything that might make someone trip, such as tools or rocks.  Have any leaves, snow, or ice cleared regularly.  Use sand or salt on walking paths during winter.  Clean up any spills in your garage right away. This includes grease or oil spills. What other actions can I take?  Wear shoes that: ? Have a low heel. Do not wear high heels. ? Have rubber bottoms. ? Are comfortable and fit you well. ? Are closed at the toe. Do not wear open-toe sandals.  Use tools that help you move around (mobility aids) if they are needed. These include: ? Canes. ? Walkers. ? Scooters. ? Crutches.  Review your medicines with your doctor. Some medicines can make you feel dizzy. This can increase your chance  of falling. Ask your doctor what other things you can do to help prevent falls. Where to find more information  Centers for Disease Control and Prevention, STEADI: https://garcia.biz/  [employer] on Aging: BrainJudge.co.uk Contact a doctor if:  You are afraid of falling at home.  You feel weak, drowsy, or dizzy at home.  You fall at home. Summary  There are many simple things that you can do to make your home safe and to help prevent falls.  Ways to make your home safe include removing tripping hazards and installing grab bars in the bathroom.  Ask for help when making these changes in your home. This information is not intended to replace advice given to you by your health care provider. Make sure you discuss any questions you have with your health care provider. Document Released: 03/08/2009 Document Revised: 09/02/2018 Document Reviewed: 12/25/2016 Elsevier Patient Education  2020 [place] Maintenance, Female Adopting a healthy lifestyle and getting preventive care are important in promoting health  and wellness. Ask your health care provider about:  The right schedule for you to have regular tests and exams.  Things you can do on your own to prevent diseases and keep yourself healthy. What should I know about diet, weight, and exercise? Eat a healthy diet   Eat a diet that includes plenty of vegetables, fruits, low-fat dairy products, and lean protein.  Do not eat a lot of foods that are high in solid fats, added sugars, or sodium. Maintain a healthy weight Body mass index (BMI) is used to identify weight problems. It estimates body fat based on height and weight. Your health care provider can help determine your BMI and help you achieve or maintain a healthy weight. Get regular exercise Get regular exercise. This is one of the most important things you can do for your health. Most adults should:  Exercise for at least 150 minutes each  week. The exercise should increase your heart rate and make you sweat (moderate-intensity exercise).  Do strengthening exercises at least twice a week. This is in addition to the moderate-intensity exercise.  Spend less time sitting. Even light physical activity can be beneficial. Watch cholesterol and blood lipids Have your blood tested for lipids and cholesterol at 66 years of age, then have this test every 5 years. Have your cholesterol levels checked more often if:  Your lipid or cholesterol levels are high.  You are older than 66 years of age.  You are at high risk for heart disease. What should I know about cancer screening? Depending on your health history and family history, you may need to have cancer screening at various ages. This may include screening for:  Breast cancer.  Cervical cancer.  Colorectal cancer.  Skin cancer.  Lung cancer. What should I know about heart disease, diabetes, and high blood pressure? Blood pressure and heart disease  High blood pressure causes heart disease and increases the risk of stroke. This is more likely to develop in people who have high blood pressure readings, are of African descent, or are overweight.  Have your blood pressure checked: ? Every 3-5 years if you are 44-23 years of age. ? Every year if you are 74 years old or older. Diabetes Have regular diabetes screenings. This checks your fasting blood sugar level. Have the screening done:  Once every three years after age 12 if you are at a normal weight and have a low risk for diabetes.  More often and at a younger age if you are overweight or have a high risk for diabetes. What should I know about preventing infection? Hepatitis B If you have a higher risk for hepatitis B, you should be screened for this virus. Talk with your health care provider to find out if you are at risk for hepatitis B infection. Hepatitis C Testing is recommended for:  Everyone born from 18  through 1965.  Anyone with known risk factors for hepatitis C. Sexually transmitted infections (STIs)  Get screened for STIs, including gonorrhea and chlamydia, if: ? You are sexually active and are younger than 66 years of age. ? You are older than 66 years of age and your health care provider tells you that you are at risk for this type of infection. ? Your sexual activity has changed since you were last screened, and you are at increased risk for chlamydia or gonorrhea. Ask your health care provider if you are at risk.  Ask your health care provider about whether you are at  high risk for HIV. Your health care provider may recommend a prescription medicine to help prevent HIV infection. If you choose to take medicine to prevent HIV, you should first get tested for HIV. You should then be tested every 3 months for as long as you are taking the medicine. Pregnancy  If you are about to stop having your period (premenopausal) and you may become pregnant, seek counseling before you get pregnant.  Take 400 to 800 micrograms (mcg) of folic acid every day if you become pregnant.  Ask for birth control (contraception) if you want to prevent pregnancy. Osteoporosis and menopause Osteoporosis is a disease in which the bones lose minerals and strength with aging. This can result in bone fractures. If you are 63 years old or older, or if you are at risk for osteoporosis and fractures, ask your health care provider if you should:  Be screened for bone loss.  Take a calcium or vitamin D supplement to lower your risk of fractures.  Be given hormone replacement therapy (HRT) to treat symptoms of menopause. Follow these instructions at home: Lifestyle  Do not use any products that contain nicotine or tobacco, such as cigarettes, e-cigarettes, and chewing tobacco. If you need help quitting, ask your health care provider.  Do not use street drugs.  Do not share needles.  Ask your health care  provider for help if you need support or information about quitting drugs. Alcohol use  Do not drink alcohol if: ? Your health care provider tells you not to drink. ? You are pregnant, may be pregnant, or are planning to become pregnant.  If you drink alcohol: ? Limit how much you use to 0-1 drink a day. ? Limit intake if you are breastfeeding.  Be aware of how much alcohol is in your drink. In the [place] one drink equals one 12 oz bottle of beer (355 mL), one 5 oz glass of wine (148 mL), or one 1 oz glass of hard liquor (44 mL). General instructions  Schedule regular health, dental, and eye exams.  Stay current with your vaccines.  Tell your health care provider if: ? You often feel depressed. ? You have ever been abused or do not feel safe at home. Summary  Adopting a healthy lifestyle and getting preventive care are important in promoting health and wellness.  Follow your health care provider's instructions about healthy diet, exercising, and getting tested or screened for diseases.  Follow your health care provider's instructions on monitoring your cholesterol and blood pressure. This information is not intended to replace advice given to you by your health care provider. Make sure you discuss any questions you have with your health care provider. Document Released: 11/25/2010 Document Revised: 05/05/2018 Document Reviewed: 05/05/2018 Elsevier Patient Education  2020 [employer].   Steps to Quit Smoking Smoking tobacco is the leading cause of preventable death. It can affect almost every organ in the body. Smoking puts you and people around you at risk for many serious, long-lasting (chronic) diseases. Quitting smoking can be hard, but it is one of the best things that you can do for your health. It is never too late to quit. How do I get ready to quit? When you decide to quit smoking, make a plan to help you succeed. Before you quit:  Pick a date to quit. Set a date  within the next 2 weeks to give you time to prepare.  Write down the reasons why you are quitting. Keep this list  in places where you will see it often.  Tell your family, friends, and co-workers that you are quitting. Their support is important.  Talk with your doctor about the choices that may help you quit.  Find out if your health insurance will pay for these treatments.  Know the people, places, things, and activities that make you want to smoke (triggers). Avoid them. What first steps can I take to quit smoking?  Throw away all cigarettes at home, at work, and in your car.  Throw away the things that you use when you smoke, such as ashtrays and lighters.  Clean your car. Make sure to empty the ashtray.  Clean your home, including curtains and carpets. What can I do to help me quit smoking? Talk with your doctor about taking medicines and seeing a counselor at the same time. You are more likely to succeed when you do both.  If you are pregnant or breastfeeding, talk with your doctor about counseling or other ways to quit smoking. Do not take medicine to help you quit smoking unless your doctor tells you to do so. To quit smoking: Quit right away  Quit smoking totally, instead of slowly cutting back on how much you smoke over a period of time.  Go to counseling. You are more likely to quit if you go to counseling sessions regularly. Take medicine You may take medicines to help you quit. Some medicines need a prescription, and some you can buy over-the-counter. Some medicines may contain a drug called nicotine to replace the nicotine in cigarettes. Medicines may:  Help you to stop having the desire to smoke (cravings).  Help to stop the problems that come when you stop smoking (withdrawal symptoms). Your doctor may ask you to use:  Nicotine patches, gum, or lozenges.  Nicotine inhalers or sprays.  Non-nicotine medicine that is taken by mouth. Find resources Find  resources and other ways to help you quit smoking and remain smoke-free after you quit. These resources are most helpful when you use them often. They include:  Online chats with a Social worker.  Phone quitlines.  Printed Furniture conservator/restorer.  Support groups or group counseling.  Text messaging programs.  Mobile phone apps. Use apps on your mobile phone or tablet that can help you stick to your quit plan. There are many free apps for mobile phones and tablets as well as websites. Examples include Quit Guide from the [employer] and smokefree.gov  What things can I do to make it easier to quit?   Talk to your family and friends. Ask them to support and encourage you.  Call a phone quitline (1-800-QUIT-NOW), reach out to support groups, or work with a Social worker.  Ask people who smoke to not smoke around you.  Avoid places that make you want to smoke, such as: ? Bars. ? Parties. ? Smoke-break areas at work.  Spend time with people who do not smoke.  Lower the stress in your life. Stress can make you want to smoke. Try these things to help your stress: ? Getting regular exercise. ? Doing deep-breathing exercises. ? Doing yoga. ? Meditating. ? Doing a body scan. To do this, close your eyes, focus on one area of your body at a time from head to toe. Notice which parts of your body are tense. Try to relax the muscles in those areas. How will I feel when I quit smoking? Day 1 to 3 weeks Within the first 24 hours, you may start to have some  problems that come from quitting tobacco. These problems are very bad 2-3 days after you quit, but they do not often last for more than 2-3 weeks. You may get these symptoms:  Mood swings.  Feeling restless, nervous, angry, or annoyed.  Trouble concentrating.  Dizziness.  Strong desire for high-sugar foods and nicotine.  Weight gain.  Trouble pooping (constipation).  Feeling like you may vomit (nausea).  Coughing or a sore throat.  Changes in  how the medicines that you take for other issues work in your body.  Depression.  Trouble sleeping (insomnia). Week 3 and afterward After the first 2-3 weeks of quitting, you may start to notice more positive results, such as:  Better sense of smell and taste.  Less coughing and sore throat.  Slower heart rate.  Lower blood pressure.  Clearer skin.  Better breathing.  Fewer sick days. Quitting smoking can be hard. Do not give up if you fail the first time. Some people need to try a few times before they succeed. Do your best to stick to your quit plan, and talk with your doctor if you have any questions or concerns. Summary  Smoking tobacco is the leading cause of preventable death. Quitting smoking can be hard, but it is one of the best things that you can do for your health.  When you decide to quit smoking, make a plan to help you succeed.  Quit smoking right away, not slowly over a period of time.  When you start quitting, seek help from your doctor, family, or friends. This information is not intended to replace advice given to you by your health care provider. Make sure you discuss any questions you have with your health care provider. Document Released: 03/08/2009 Document Revised: 07/30/2018 Document Reviewed: 07/31/2018 Elsevier Patient Education  2020 [employer].

## 2019-01-07 NOTE — Progress Notes (Signed)
This AWV is being conducted by [place] only. The patient was located at home and I was located in [facility]. The patient's identity was confirmed using their DOB and current address. The patient or his/her legal guardian has consented to being evaluated through a telephone encounter and understands the associated risks (an examination cannot be done and the patient may need to come in for an appointment) / benefits (allows the patient to remain at home, decreasing exposure to coronavirus). I personally spent 34 minutes conducting the AWV.  Subjective:   Krista Sosa is a 68 y.o. female who presents for a Medicare Annual Wellness Visit.  The following items have been reviewed and updated today in the appropriate area in the EMR.   Health Risk Assessment  Height, weight, BMI, and BP Visual acuity if needed Depression screen Fall risk / safety level Advance directive discussion Medical and family history were reviewed and updated Updating list of other providers & suppliers Medication reconciliation, including over the counter medicines Cognitive screen Written screening schedule Risk Factor list Personalized health advice, risky behaviors, and treatment advice  Social History   Social History Narrative   Current Social History 01/07/2019        Patient lives alone in a third floor apartment. There are steps with handrails up to the entrance the patient uses.       Patient's method of transportation is personal car.      The highest level of education was some high school: 11 th grade      The patient currently retired from [employer]. Works part-time as a Sports coach at [employer]      Identified important Relationships are "[place]"       Pets : dog, Environmental education officer / Fun: "Walking"       Current Stressors: "The news"       Religious / Personal Beliefs: "Holiness"       L. Ducatte, RN, BSN             Objective:    Vitals: LMP 08/27/2007   Vitals are unable to obtained due to OZHYQ-65 public health emergency  Activities of Daily Living In your present state of health, do you have any difficulty performing the following activities: 01/07/2019 01/03/2019  Hearing? N N  Vision? Y N  Comment post lasik has difficulty with near vision -  Difficulty concentrating or making decisions? N N  Walking or climbing stairs? N N  Dressing or bathing? N N  Comment - -  Doing errands, shopping? N N  Some recent data might be hidden    Goals Goals    . Blood Pressure < 140/90    . Exercise 3x per week (30 min per time)     Walking    . HEMOGLOBIN A1C < 7.0    . LDL CALC < 100    . Quit smoking / using tobacco    . Weight < 150 lb (68 kg)     Information provided on 1-800-QUIT-NOW.    Fall Risk Fall Risk  01/07/2019 01/03/2019 12/30/2018 11/24/2018 09/29/2018  Falls in the past year? 0 0 0 0 0  Number falls in past yr: - 0 - - -  Injury with Fall? - - - - -  Risk for fall due to : - - - - -  Risk for fall due to: Comment - - - - -  Follow up Education  provided;Falls prevention discussed - - - -   CDC Handout on Fall Prevention and Handout on Home Exercise Program, Access codes [id number] and [email or web address] given/mailed to patient with exercise band.   Depression Screen PHQ 2/9 Scores 01/03/2019 12/30/2018 11/24/2018 09/29/2018  PHQ - 2 Score 0 0 - 2  PHQ- 9 Score 1 2 - 3  Exception Documentation - - Other- indicate reason in comment box -    PHQ-9 not repeated today as it was done only 4 days ago.  Cognitive Testing Six-Item Cognitive Screener   "I would like to ask you some questions that ask you to use your memory. I am going to name three objects. Please wait until I say all three words, then repeat them. Remember what they are  because I am going to ask you to name them again in a few minutes. Please repeat these words for me: APPLE-TABLE-PENNY." (Interviewer may repeat names 3 times if necessary but repetition not scored.)  Did  patient correctly repeat all three words? Yes - may proceed with screen  What year is this? Correct What month is this? Correct What day of the week is this? Correct  What were the three objects I asked you to remember? . Apple Correct . Table Correct . Penny Correct  Score one point for each incorrect answer.  A score of 2 or more points warrants additional investigation.  Patient's score 0   Assessment and Plan:     Patient had been taking Advair only once daily. Will now start taking BID as directed. Had been taking 1 tab zolft 3-4 times per week. Will now begin taking 2 tabs daily as directed Patient states ibuprofen 800 mg not helping shoulder/neck pain. Patient denied pain at present. Will route to PCP to advise. Patient states she will not get the flu vaccine this year 2/2 "made me sick for a long time last year." discussed importance of yearly flu vaccine especially now during Covid-19. Patient will consider it. VIS mailed to patient.  During the course of the visit the patient was educated and counseled about appropriate screening and preventive services as documented in the assessment and plan.  The printed AVS was given to the patient and included an updated screening schedule, a list of risk factors, and personalized health advice.        Velora Heckler, RN  01/07/2019

## 2019-01-07 NOTE — Progress Notes (Signed)
I discussed the AWV findings with the RN who conducted the visit. I was present in the office suite and immediately available to provide assistance and direction throughout the time the service was provided.   

## 2019-01-14 ENCOUNTER — Other Ambulatory Visit: Payer: Self-pay | Admitting: Internal Medicine

## 2019-01-14 DIAGNOSIS — R809 Proteinuria, unspecified: Secondary | ICD-10-CM

## 2019-01-14 DIAGNOSIS — E1129 Type 2 diabetes mellitus with other diabetic kidney complication: Secondary | ICD-10-CM

## 2019-01-20 ENCOUNTER — Other Ambulatory Visit: Payer: Self-pay | Admitting: Internal Medicine

## 2019-01-20 DIAGNOSIS — Z1231 Encounter for screening mammogram for malignant neoplasm of breast: Secondary | ICD-10-CM

## 2019-02-07 ENCOUNTER — Encounter: Payer: Medicare HMO | Admitting: Internal Medicine

## 2019-02-07 NOTE — Progress Notes (Deleted)
CC: ***  HPI:  [name] is a 68 y.o.  with a PMH listed below presenting for ***   Please see A&P for status of the patient's chronic medical conditions  Past Medical History:  Diagnosis Date  . Allergy   . Asthma, chronic 06/09/2006  . Bilateral cataracts 05/07/2016   s/p left cataract extraction 10/02/2016 and right cataract extraction 10/16/2016  . Bronchiectasis without complication ([place]) [id number]   Mild left lower lobe bronchiectasis seen on CT scan of the thorax in December 2019  . Cataract    bil eyes  . Essential hypertension 02/12/2018  . Gastroesophageal reflux disease 08/13/2011  . Glaucoma    Per patient report  . Hx of adenomatous colonic polyps 11/16/2008  . Hypertension    pt said she is supposed to take med but she is not taking it.  . Hypertensive retinopathy, grade 2, bilateral 05/07/2016  . Latent tuberculosis    dx 03/17/08 - tx with INH X 9 months  . Lichenoid dermatitis 02/13/2012   Previously treated with Triamcinolone 0.025% cream Q8-12H PRN   . Migraine headache 08/06/2018  . Osteoarthritis, generalized 05/21/2006   Diffuse: cervical spine, right shoulder, left wrist Ineffective medications: Tylenol, ASA, Naprosyn, Ibuprofen, Meloxicam    . Overweight (BMI 25.0-29.9) 02/13/2012  . Perennial allergic rhinitis with seasonal variation 06/09/2006  . Persistent microalbuminuria associated with type II diabetes mellitus ([place]) 05/21/2006  . Sickle cell anemia (HCC)    pt has sickle cell trait per pt  . Sickle cell trait ([place]) 02/13/2012  . Tobacco abuse disorder 06/09/2006  . Tubular adenoma of rectum 11/16/2008   5 mm, excised endoscopically 11/16/2008, repeat colonoscopy due July 2015   . Ulnar neuropathy of right upper extremity 02/14/2016  . Urinary tract disease    JICP   Review of Systems: Refer to history of present illness and assessment and plans for pertinent review of systems, all others reviewed and negative.  Physical Exam:  There  were no vitals filed for this visit. *** Physical Exam  Social History   Socioeconomic History  . Marital status: Single    Spouse name: Not on file  . Number of children: Not on file  . Years of education: Not on file  . Highest education level: Not on file  Occupational History  . Occupation: Retired  Scientific laboratory technician  . Financial resource strain: Not on file  . Food insecurity    Worry: Not on file    Inability: Not on file  . Transportation needs    Medical: Not on file    Non-medical: Not on file  Tobacco Use  . Smoking status: Current Every Day Smoker    Packs/day: 0.50    Years: 25.00    Pack years: 12.50    Types: Cigarettes  . Smokeless tobacco: Never Used  . Tobacco comment: .5 ppd  Substance and Sexual Activity  . Alcohol use: Yes    Alcohol/week: 0.0 standard drinks    Comment: occasionally  . Drug use: No    Comment: positive marijuana metabolite in 2010  . Sexual activity: Not Currently    Birth control/protection: Other-see comments    Comment: s/p tubal ligation  Lifestyle  . Physical activity    Days per week: Not on file    Minutes per session: Not on file  . Stress: Not on file  Relationships  . Social Herbalist on phone: Not on file    Gets together: Not  on file    Attends religious service: Not on file    Active member of club or organization: Not on file    Attends meetings of clubs or organizations: Not on file    Relationship status: Not on file  . Intimate partner violence    Fear of current or ex partner: Not on file    Emotionally abused: Not on file    Physically abused: Not on file    Forced sexual activity: Not on file  Other Topics Concern  . Not on file  Social History Narrative   Current Social History 01/07/2019        Patient lives alone in a third floor apartment. There are steps with handrails up to the entrance the patient uses.       Patient's method of transportation is personal car.      The highest level  of education was some high school: 11 th grade      The patient currently retired from [employer]. Works part-time as a Sports coach at [employer]      Identified important Relationships are "[place]"       Pets : dog, Environmental education officer / Fun: "Walking"       Current Stressors: "The news"       Religious / Personal Beliefs: "Holiness"       L. Ducatte, RN, BSN       *** Family History  Problem Relation Age of Onset  . Diabetes Mother   . Heart disease Mother 47  . Colon cancer Mother   . Cancer Mother        Colon  . Cancer Father        Lung  . Sickle cell anemia Sister   . Sickle cell anemia Brother   . Diabetes Sister   . Brain cancer Sister   . Cancer Sister        brain  . Lung cancer Brother   . Cancer Brother        Lung  . Healthy Sister   . Healthy Sister   . Healthy Sister   . Healthy Daughter   . Healthy Son   . Healthy Daughter   . Healthy Daughter   . Healthy Son   . Wilson's disease Maternal Uncle   . Breast cancer Maternal Aunt   . Breast cancer Other   . Esophageal cancer Neg Hx   . Rectal cancer Neg Hx   . Stomach cancer Neg Hx     Assessment & Plan:   See Encounters Tab for problem based charting.  Patient {GC/GE:3044014::"discussed with","seen with"} Dr. {NAMES:[id number]::"Butcher","Granfortuna","E. Hoffman","Klima","Mullen","Narendra","Raines","Vincent"}

## 2019-02-21 ENCOUNTER — Ambulatory Visit (INDEPENDENT_AMBULATORY_CARE_PROVIDER_SITE_OTHER): Payer: Medicare HMO | Admitting: Internal Medicine

## 2019-02-21 ENCOUNTER — Encounter: Payer: Self-pay | Admitting: Internal Medicine

## 2019-02-21 ENCOUNTER — Telehealth: Payer: Self-pay | Admitting: Dietician

## 2019-02-21 ENCOUNTER — Encounter (INDEPENDENT_AMBULATORY_CARE_PROVIDER_SITE_OTHER): Payer: Self-pay

## 2019-02-21 ENCOUNTER — Other Ambulatory Visit: Payer: Self-pay

## 2019-02-21 VITALS — BP 135/62 | HR 84 | Temp 99.0°F | Ht 67.0 in | Wt 167.7 lb

## 2019-02-21 DIAGNOSIS — M25512 Pain in left shoulder: Secondary | ICD-10-CM | POA: Diagnosis not present

## 2019-02-21 DIAGNOSIS — Z79899 Other long term (current) drug therapy: Secondary | ICD-10-CM

## 2019-02-21 DIAGNOSIS — Z0000 Encounter for general adult medical examination without abnormal findings: Secondary | ICD-10-CM

## 2019-02-21 DIAGNOSIS — R809 Proteinuria, unspecified: Secondary | ICD-10-CM

## 2019-02-21 DIAGNOSIS — M47812 Spondylosis without myelopathy or radiculopathy, cervical region: Secondary | ICD-10-CM | POA: Diagnosis not present

## 2019-02-21 DIAGNOSIS — Z7984 Long term (current) use of oral hypoglycemic drugs: Secondary | ICD-10-CM | POA: Diagnosis not present

## 2019-02-21 DIAGNOSIS — Z23 Encounter for immunization: Secondary | ICD-10-CM

## 2019-02-21 DIAGNOSIS — I10 Essential (primary) hypertension: Secondary | ICD-10-CM | POA: Diagnosis not present

## 2019-02-21 DIAGNOSIS — F17210 Nicotine dependence, cigarettes, uncomplicated: Secondary | ICD-10-CM

## 2019-02-21 DIAGNOSIS — E1129 Type 2 diabetes mellitus with other diabetic kidney complication: Secondary | ICD-10-CM | POA: Diagnosis not present

## 2019-02-21 DIAGNOSIS — M67912 Unspecified disorder of synovium and tendon, left shoulder: Secondary | ICD-10-CM

## 2019-02-21 LAB — GLUCOSE, CAPILLARY: Glucose-Capillary: 176 mg/dL — ABNORMAL HIGH (ref 70–99)

## 2019-02-21 MED ORDER — MELOXICAM 7.5 MG PO TABS: 7.5000 mg | ORAL_TABLET | Freq: Every day | ORAL | 0 refills | Status: DC

## 2019-02-21 MED ORDER — PREDNISONE 20 MG PO TABS: ORAL_TABLET | ORAL | 0 refills | Status: AC

## 2019-02-21 NOTE — Patient Instructions (Signed)
[name],  It was a pleasure to see you today. Thank you for coming in.   Today we discussed your shoulder pain. I have ordered and MRI of your neck. Please start taking the prednisone taper and the meloxicam.  I will call you with the results of the MRI, if this is negative then we can order an image of your shoulder.    We also discussed your diabetes. Please try to check your blood sugars at home. I have sent in a referral for you to get the continuous glucose monitor.   Please return to clinic in 3 months or sooner if needed.   Thank you again for coming in.   [name].

## 2019-02-21 NOTE — Telephone Encounter (Signed)
Steroid-Induced Hyperglycemia Prevention and Management Krista Sosa is a 68 y.o. female who meets criteria for [facility] glucose monitoring program (diabetes patient prescribed short course of steroids).  A/P Current Regimen  Patient prescribed prednisone 3 tablets (60 mg total) by mouth daily for 5 days, THEN 2.5 tablets (50 mg total) daily for 3 days, THEN 2 tablets (40 mg total) daily for 3 days, THEN 1.5 tablets (30 mg total) daily for 3 days, THEN 1 tablet (20 mg total) daily for 3 days, THEN 0.5 tablets (10 mg total) daily for 3 days, currently on day 0 of therapy. Patient taking prednisone in the   Prednisone indication: shoulder pain  Current DM regimen jardiance 25 mg and janumet 50-1000 mg twice a day  Home BG Monitoring  Patient does have a meter at home and does not like to check BG at home per Dr. Sherry Ruffing. Meter was not supplied.  CBGs at home unknown  CBGs prior to steroid course unknown, A1C prior to steroid course 9.5  Medication Management  Physician preference level per protocol: 1  Patient Education  Will do when we talk  Follow-up tomorrow by phone  3 months  Butch Penny Plyler 4:54 PM 02/21/2019

## 2019-02-21 NOTE — Progress Notes (Signed)
CC: Hypertension, left arm pain, diabetes  HPI:  Krista Sosa is a 68 y.o.  with a PMH listed below presenting for hypertension, left arm pain, diabetes   Please see A&P for status of the patient's chronic medical conditions  Past Medical History:  Diagnosis Date  . Allergy   . Asthma, chronic 06/09/2006  . Bilateral cataracts 05/07/2016   s/p left cataract extraction 10/02/2016 and right cataract extraction 10/16/2016  . Bronchiectasis without complication ([place]) [id number]   Mild left lower lobe bronchiectasis seen on CT scan of the thorax in December 2019  . Cataract    bil eyes  . Essential hypertension 02/12/2018  . Gastroesophageal reflux disease 08/13/2011  . Glaucoma    Per patient report  . Hx of adenomatous colonic polyps 11/16/2008  . Hypertension    pt said she is supposed to take med but she is not taking it.  . Hypertensive retinopathy, grade 2, bilateral 05/07/2016  . Latent tuberculosis    dx 03/17/08 - tx with INH X 9 months  . Lichenoid dermatitis 02/13/2012   Previously treated with Triamcinolone 0.025% cream Q8-12H PRN   . Migraine headache 08/06/2018  . Osteoarthritis, generalized 05/21/2006   Diffuse: cervical spine, right shoulder, left wrist Ineffective medications: Tylenol, ASA, Naprosyn, Ibuprofen, Meloxicam    . Overweight (BMI 25.0-29.9) 02/13/2012  . Perennial allergic rhinitis with seasonal variation 06/09/2006  . Persistent microalbuminuria associated with type II diabetes mellitus ([place]) 05/21/2006  . Sickle cell anemia (HCC)    pt has sickle cell trait per pt  . Sickle cell trait ([place]) 02/13/2012  . Tobacco abuse disorder 06/09/2006  . Tubular adenoma of rectum 11/16/2008   5 mm, excised endoscopically 11/16/2008, repeat colonoscopy due July 2015   . Ulnar neuropathy of right upper extremity 02/14/2016  . Urinary tract disease    JICP   Review of Systems: Refer to history of present illness and assessment and plans for pertinent review of  systems, all others reviewed and negative.  Physical Exam:  Vitals:   02/21/19 1422  BP: 135/62  Pulse: 84  Temp: 99 F (37.2 C)  TempSrc: Oral  SpO2: 97%  Weight: 167 lb 11.2 oz (76.1 kg)  Height: 5\' 7"  (1.702 m)    Physical Exam  Constitutional: She is oriented to person, place, and time and well-developed, well-nourished, and in no distress.  HENT:  Head: Normocephalic and atraumatic.  Cardiovascular: Normal rate, regular rhythm and normal heart sounds.  Pulmonary/Chest: Effort normal and breath sounds normal. No respiratory distress.  Abdominal: Soft. Bowel sounds are normal. She exhibits no distension.  Musculoskeletal:     Comments: Left shoulder: unable to actively abduct or extend shoulder above 45 degrees, limited passive movement to 90 degrees, tenderness to palpation over anterior shoulder joint and left cervical spinal area.  Neurological: She is alert and oriented to person, place, and time.  LUE: 4/5 strength in elbow flexion, sensation to light touch intact, 2+ biceps reflex test RUE: 5/5 strength, sensation to light touch intact, 2+ biceps reflex test    Social History   Socioeconomic History  . Marital status: Single    Spouse name: Not on file  . Number of children: Not on file  . Years of education: Not on file  . Highest education level: Not on file  Occupational History  . Occupation: Retired  Scientific laboratory technician  . Financial resource strain: Not on file  . Food insecurity    Worry: Not on file  Inability: Not on file  . Transportation needs    Medical: Not on file    Non-medical: Not on file  Tobacco Use  . Smoking status: Current Every Day Smoker    Packs/day: 0.50    Years: 25.00    Pack years: 12.50    Types: Cigarettes  . Smokeless tobacco: Never Used  . Tobacco comment: .5 ppd  Substance and Sexual Activity  . Alcohol use: Yes    Alcohol/week: 0.0 standard drinks    Comment: occasionally  . Drug use: No    Comment: positive marijuana  metabolite in 2010  . Sexual activity: Not Currently    Birth control/protection: Other-see comments    Comment: s/p tubal ligation  Lifestyle  . Physical activity    Days per week: Not on file    Minutes per session: Not on file  . Stress: Not on file  Relationships  . Social Herbalist on phone: Not on file    Gets together: Not on file    Attends religious service: Not on file    Active member of club or organization: Not on file    Attends meetings of clubs or organizations: Not on file    Relationship status: Not on file  . Intimate partner violence    Fear of current or ex partner: Not on file    Emotionally abused: Not on file    Physically abused: Not on file    Forced sexual activity: Not on file  Other Topics Concern  . Not on file  Social History Narrative   Current Social History 01/07/2019        Patient lives alone in a third floor apartment. There are steps with handrails up to the entrance the patient uses.       Patient's method of transportation is personal car.      The highest level of education was some high school: 11 th grade      The patient currently retired from [employer]. Works part-time as a Sports coach at [employer]      Identified important Relationships are "[place]"       Pets : dog, Environmental education officer / Fun: "Walking"       Current Stressors: "The news"       Religious / Personal Beliefs: "Holiness"       L. Ducatte, RN, BSN        Family History  Problem Relation Age of Onset  . Diabetes Mother   . Heart disease Mother 21  . Colon cancer Mother   . Cancer Mother        Colon  . Cancer Father        Lung  . Sickle cell anemia Sister   . Sickle cell anemia Brother   . Diabetes Sister   . Brain cancer Sister   . Cancer Sister        brain  . Lung cancer Brother   . Cancer Brother        Lung  . Healthy Sister   . Healthy Sister   . Healthy Sister   . Healthy Daughter   . Healthy Son   .  Healthy Daughter   . Healthy Daughter   . Healthy Son   . Wilson's disease Maternal Uncle   . Breast cancer Maternal Aunt   . Breast cancer Other   . Esophageal cancer Neg Hx   . Rectal  cancer Neg Hx   . Stomach cancer Neg Hx     Assessment & Plan:   See Encounters Tab for problem based charting.  Patient discussed with Dr. Philipp Ovens

## 2019-02-22 ENCOUNTER — Encounter: Payer: Medicare HMO | Admitting: Dietician

## 2019-02-22 ENCOUNTER — Telehealth: Payer: Self-pay | Admitting: Dietician

## 2019-02-22 NOTE — Telephone Encounter (Addendum)
Prevention of steroid induced hyperglycemia follow up:  Spoke to Krista Sosa today- explained she doe snot qualitfy for her insurace to pay for the [employer]. She agreed to check her blood sugars while on steroids.  Steroids:took steroids 6 AM Blood sugar: has a meter, does not like to stick herself, will today and while on steroids. Would like help learning how to make it less painful some time. But right now has too many other appointments Diabetes Medicine: jardiance 25 mg & janumet- 50-1000 two times a day Hypoglycemia symptoms- need to assess at future visit Hyperglycemia symptoms: need to assess at future visit Education:how steroids affect blood sugars and need to monitor Plan- call tomorrow to find out how her blood sugars are doing.  Request refill on lancets as per patient Krista Sosa, RD 02/22/2019 1:51 PM.

## 2019-02-23 NOTE — Progress Notes (Signed)
Internal Medicine Clinic Attending  Case discussed with Dr. Krienke at the time of the visit.  We reviewed the resident's history and exam and pertinent patient test results.  I agree with the assessment, diagnosis, and plan of care documented in the resident's note.    
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
## 2019-02-23 NOTE — Assessment & Plan Note (Addendum)
Patient is currently taking Janumet 50-1000 mg twice daily and Jardiance 25 mg daily.  She does not check her blood sugars at home due to not liking to prick her fingers.  She does endorse some polydipsia and mild decreased appetite, denies any polyuria. Her A1c 1 month ago was 9.3.  Her CBG today was 176.  She was requesting if she could get a continuous glucose monitor so she wouldn't need to check her blood sugars, will refer to diabetes educator.  Encouraged her to try checking her blood sugars.   -Continue Janumet 50-1000 mg BID -Continue Jardiance 25 mg daily -Referral to diabetes educator

## 2019-02-23 NOTE — Assessment & Plan Note (Signed)
Patient received flu shot today 

## 2019-02-23 NOTE — Assessment & Plan Note (Signed)
Patient is currently on losartan 25 mg daily.  She reports that she has had no issues taking the medication, denies any side effects.  Blood pressure today is well controlled.   BP Readings from Last 3 Encounters:  02/21/19 135/62  01/03/19 129/67  12/30/18 (!) 141/77   -Continue losartan 25 mg daily

## 2019-02-24 NOTE — Telephone Encounter (Signed)
Prevention of steroid induced hyperglycemia follow up:   Steroids: day 4 Blood sugar: has not checked, does not know how to use her meter Diabetes Medicine: Vania Rea and janumet Hypoglycemia symptoms:no Hyperglycemia symptoms: normal tired from "always doing something"  Plan- appointment scheduled for Monday to help her learn how to use her meter [name], RD 02/24/2019 5:12 PM.

## 2019-02-28 ENCOUNTER — Telehealth: Payer: Self-pay | Admitting: Dietician

## 2019-02-28 ENCOUNTER — Other Ambulatory Visit: Payer: Self-pay

## 2019-02-28 ENCOUNTER — Ambulatory Visit: Payer: Medicare HMO | Admitting: Dietician

## 2019-02-28 DIAGNOSIS — E1129 Type 2 diabetes mellitus with other diabetic kidney complication: Secondary | ICD-10-CM

## 2019-02-28 MED ORDER — GLIPIZIDE 5 MG PO TABS: ORAL_TABLET | ORAL | 0 refills | Status: DC

## 2019-02-28 NOTE — Telephone Encounter (Signed)
Sent that in, thanks!

## 2019-02-28 NOTE — Progress Notes (Signed)
Prevention of steroid induced hyperglycemia follow up:   Steroids:day 8 today, she just finished taking 50 mg prednisone x3 days, starts  taking 40 mg x 3 days in the morning, she says her shoulder still hurts and her MRI is next Thursday Blood sugar: 240mg /dl today in office a few hours after eating Diabetes Medicine: Krista Sosa, janumet 50-1000 twice daily Hypoglycemia symptoms: no Hyperglycemia symptoms:  yes  Krista Sosa presents today for meter instruction. She brings a [employer] meter and supplies with her.  She needs to check her blood sugar while on steroids two times a day. She was educated on how to use her meter today. She repeated back the demonstration without problem.  Her blood sugar is 240 today on her glucometer. She reports symptoms of hyperglycemia since starting steroids. Her symptoms are headaches and increased thirst.  The steroid protocol recommends starting immediate release glipizide 5 mg. She has some glipizide left from when she took it before and was instructed not to take that because it is not immediate release and is 10 mg. Plan: request prescription for 12 days of 5mg  immediate release glipizide. Call daily to follow blood sugars . Krista Sosa verbalized understanding,.   [name], RD 02/28/2019 3:16 PM.

## 2019-02-28 NOTE — Telephone Encounter (Signed)
Request 12 days of immediate release 5 mg glipizide for patient to take daily in the morning before breakfast while on steroids. See my note from today for more information.

## 2019-02-28 NOTE — Telephone Encounter (Signed)
Opened in error

## 2019-02-28 NOTE — Patient Instructions (Addendum)
Please check your blood sugar two times a day while on steroids  If your blood sugar is more than 200 mg/dl; take 5 mg glipizide in the morning with your steroid.   Stop the glipizide when you stop the steroids.  You'll need glipizide for the about 12 days  Be sure to drink plenty of water and sugar free beverages and eat smaller portions of starches and fruits   You can eat more veggies and protein.  Butch Penny [phone]

## 2019-03-01 NOTE — Telephone Encounter (Addendum)
Prevention of steroid induced hyperglycemia follow up:   Steroids: day 9 Blood sugar:  205 this am, checked last night it was in the 200s.   Diabetes Medicine: picked up & took 5 mg today, janumet and jardiance Hypoglycemia symptoms- no Hyperglycemia symptoms: no Goals is for blood sugar to be between 100-200 mg/dl.  Plan- continue with 5 mg glipizide qam, check blood sugar two times a day, call tomorrow afternoon.  Krista Sosa, RD 03/01/2019 4:08 PM.

## 2019-03-02 NOTE — Telephone Encounter (Addendum)
Left voicemail for return call [name], RD 03/02/2019 4:14 PM.  Left voicemail for return call [name], RD 03/03/2019 4:38 PM.

## 2019-03-04 NOTE — Telephone Encounter (Signed)
Prevention of steroid induced hyperglycemia follow up:  Shoulder about the same. She says she has never had a low blood sugar.   Steroids:today is day 17- took 1 pill this am  Blood sugar: 200s, 225 this am, one night 185 Diabetes Medicine:glipizide 5mg  Hypoglycemia symptoms: no Hyperglycemia symptoms: no Plan- continue 5 mg glipizide daily, call on Monday Carroll, RD 03/04/2019 2:56 PM.

## 2019-03-07 NOTE — Telephone Encounter (Signed)
Prevention of steroid induced hyperglycemia follow up:   Steroids:Day 14 - 1 pill a day Blood sugar: 200s over the weekend and 205 Diabetes Medicine:glipizide 5mg  , janumet,, jardiance  Hypoglycemia symptoms:no Hyperglycemia symptoms: going to bathroom, drinking more, tired Getting tired of taking prednisone, wants to stop them. She agreed to have me send a note to her doctor about a quicker taper. Also agreed to cancel her appointment tomorrow with me and for call instead.  [name], RD 03/07/2019 5:56 PM.

## 2019-03-08 ENCOUNTER — Ambulatory Visit: Payer: Medicare HMO | Admitting: Dietician

## 2019-03-08 NOTE — Telephone Encounter (Signed)
Prevention of steroid induced hyperglycemia follow up:   Left voicemail for return call and also relayed Dr. Dorothyann Peng message about her steroid taper to patient   [name], RD 03/08/2019 3:42 PM.

## 2019-03-08 NOTE — Telephone Encounter (Signed)
Hello Butch Penny,   She should be on the last few days of the steroid taper. It looks like she is scheduled for her MRI later this week and I would recommend for her to continue the taper for now. However if she really wants to stop them and she doesn't feel like it was helping very much we can do a faster taper. If she is taking the 1 pill a day and she had taken this for at least 3 days she can go down to 0.5 pill per day for the next 3 days and then discontinue it. Thank you for your help with this.   Lonia Skinner

## 2019-03-09 DIAGNOSIS — D573 Sickle-cell trait: Secondary | ICD-10-CM | POA: Diagnosis not present

## 2019-03-09 DIAGNOSIS — Z9889 Other specified postprocedural states: Secondary | ICD-10-CM | POA: Diagnosis not present

## 2019-03-09 DIAGNOSIS — Z961 Presence of intraocular lens: Secondary | ICD-10-CM | POA: Diagnosis not present

## 2019-03-09 DIAGNOSIS — E119 Type 2 diabetes mellitus without complications: Secondary | ICD-10-CM | POA: Diagnosis not present

## 2019-03-09 LAB — HM DIABETES EYE EXAM

## 2019-03-10 ENCOUNTER — Ambulatory Visit (HOSPITAL_COMMUNITY)
Admission: RE | Admit: 2019-03-10 | Discharge: 2019-03-10 | Disposition: A | Discharge status: Home / Self Care | Payer: Medicare HMO | Source: Ambulatory Visit | Attending: Internal Medicine | Admitting: Internal Medicine

## 2019-03-10 ENCOUNTER — Other Ambulatory Visit: Payer: Self-pay

## 2019-03-10 DIAGNOSIS — M67912 Unspecified disorder of synovium and tendon, left shoulder: Secondary | ICD-10-CM | POA: Diagnosis not present

## 2019-03-10 DIAGNOSIS — M50122 Cervical disc disorder at C5-C6 level with radiculopathy: Secondary | ICD-10-CM | POA: Diagnosis not present

## 2019-03-10 DIAGNOSIS — M50123 Cervical disc disorder at C6-C7 level with radiculopathy: Secondary | ICD-10-CM | POA: Diagnosis not present

## 2019-03-10 DIAGNOSIS — E042 Nontoxic multinodular goiter: Secondary | ICD-10-CM | POA: Diagnosis not present

## 2019-03-10 DIAGNOSIS — M2578 Osteophyte, vertebrae: Secondary | ICD-10-CM | POA: Diagnosis not present

## 2019-03-11 NOTE — Telephone Encounter (Signed)
Prevention of steroid induced hyperglycemia follow up:   Steroids:done Blood sugar: 145 today Diabetes Medicine: took all the glipizide Hypoglycemia symptoms: no Hyperglycemia symptoms: no symptoms  Will sign off. available as needed  [name], RD 03/11/2019 4:17 PM.

## 2019-03-14 ENCOUNTER — Telehealth: Payer: Self-pay | Admitting: Internal Medicine

## 2019-03-14 DIAGNOSIS — M5410 Radiculopathy, site unspecified: Secondary | ICD-10-CM

## 2019-03-14 MED ORDER — NAPROXEN 500 MG PO TABS: 500.0000 mg | ORAL_TABLET | Freq: Two times a day (BID) | ORAL | 0 refills | Status: DC

## 2019-03-14 NOTE — Telephone Encounter (Signed)
Contacted patient regarding MRI results.  She reports that she is still having some shoulder pain but states that it has improved, still endorses weakness.  Discussed that there is some nerve impingement on her imaging and discussed referring her to neurosurgery to be evaluated. Also discussed PT referral.  She reports that she is still having some pain, especially in the morning, discussed starting her on a short course of naproxen to help with pain and she is in agreement.

## 2019-03-21 ENCOUNTER — Other Ambulatory Visit: Payer: Medicare HMO

## 2019-03-25 DIAGNOSIS — Z6825 Body mass index (BMI) 25.0-25.9, adult: Secondary | ICD-10-CM | POA: Diagnosis not present

## 2019-03-25 DIAGNOSIS — M12819 Other specific arthropathies, not elsewhere classified, unspecified shoulder: Secondary | ICD-10-CM | POA: Diagnosis not present

## 2019-03-31 ENCOUNTER — Other Ambulatory Visit: Payer: Self-pay | Admitting: Internal Medicine

## 2019-03-31 DIAGNOSIS — Z13820 Encounter for screening for osteoporosis: Secondary | ICD-10-CM

## 2019-04-04 ENCOUNTER — Ambulatory Visit
Admission: RE | Admit: 2019-04-04 | Discharge: 2019-04-04 | Disposition: A | Discharge status: Home / Self Care | Payer: Medicare HMO | Source: Ambulatory Visit | Attending: Internal Medicine | Admitting: Internal Medicine

## 2019-04-04 ENCOUNTER — Other Ambulatory Visit: Payer: Self-pay

## 2019-04-04 DIAGNOSIS — Z780 Asymptomatic menopausal state: Secondary | ICD-10-CM | POA: Diagnosis not present

## 2019-04-04 DIAGNOSIS — M8589 Other specified disorders of bone density and structure, multiple sites: Secondary | ICD-10-CM | POA: Diagnosis not present

## 2019-04-04 DIAGNOSIS — Z1231 Encounter for screening mammogram for malignant neoplasm of breast: Secondary | ICD-10-CM

## 2019-04-04 DIAGNOSIS — Z13820 Encounter for screening for osteoporosis: Secondary | ICD-10-CM

## 2019-04-19 NOTE — Addendum Note (Signed)
Addended by: Hulan Fray on: 04/19/2019 11:43 AM   Modules accepted: Orders

## 2019-05-25 ENCOUNTER — Encounter: Payer: Self-pay | Admitting: Dietician

## 2019-05-30 ENCOUNTER — Ambulatory Visit (INDEPENDENT_AMBULATORY_CARE_PROVIDER_SITE_OTHER): Payer: Medicare Other | Admitting: Internal Medicine

## 2019-05-30 ENCOUNTER — Other Ambulatory Visit: Payer: Self-pay

## 2019-05-30 ENCOUNTER — Encounter: Payer: Self-pay | Admitting: Internal Medicine

## 2019-05-30 VITALS — BP 100/49 | HR 82 | Temp 98.2°F | Ht 66.0 in | Wt 162.1 lb

## 2019-05-30 DIAGNOSIS — F17210 Nicotine dependence, cigarettes, uncomplicated: Secondary | ICD-10-CM

## 2019-05-30 DIAGNOSIS — M67912 Unspecified disorder of synovium and tendon, left shoulder: Secondary | ICD-10-CM

## 2019-05-30 DIAGNOSIS — E010 Iodine-deficiency related diffuse (endemic) goiter: Secondary | ICD-10-CM | POA: Diagnosis not present

## 2019-05-30 DIAGNOSIS — Z7984 Long term (current) use of oral hypoglycemic drugs: Secondary | ICD-10-CM

## 2019-05-30 DIAGNOSIS — I10 Essential (primary) hypertension: Secondary | ICD-10-CM | POA: Diagnosis not present

## 2019-05-30 DIAGNOSIS — M159 Polyosteoarthritis, unspecified: Secondary | ICD-10-CM

## 2019-05-30 DIAGNOSIS — R809 Proteinuria, unspecified: Secondary | ICD-10-CM | POA: Diagnosis not present

## 2019-05-30 DIAGNOSIS — Z79899 Other long term (current) drug therapy: Secondary | ICD-10-CM

## 2019-05-30 DIAGNOSIS — E1129 Type 2 diabetes mellitus with other diabetic kidney complication: Secondary | ICD-10-CM | POA: Diagnosis not present

## 2019-05-30 DIAGNOSIS — J4520 Mild intermittent asthma, uncomplicated: Secondary | ICD-10-CM

## 2019-05-30 DIAGNOSIS — M50121 Cervical disc disorder at C4-C5 level with radiculopathy: Secondary | ICD-10-CM

## 2019-05-30 DIAGNOSIS — E049 Nontoxic goiter, unspecified: Secondary | ICD-10-CM | POA: Insufficient documentation

## 2019-05-30 DIAGNOSIS — M5410 Radiculopathy, site unspecified: Secondary | ICD-10-CM

## 2019-05-30 LAB — POCT GLYCOSYLATED HEMOGLOBIN (HGB A1C): Hemoglobin A1C: 9.7 % — AB (ref 4.0–5.6)

## 2019-05-30 LAB — GLUCOSE, CAPILLARY: Glucose-Capillary: 234 mg/dL — ABNORMAL HIGH (ref 70–99)

## 2019-05-30 MED ORDER — HYDROCODONE-ACETAMINOPHEN 5-325 MG PO TABS: 1.0000 | ORAL_TABLET | ORAL | 0 refills | Status: AC | PRN

## 2019-05-30 MED ORDER — JARDIANCE 25 MG PO TABS: 25.0000 mg | ORAL_TABLET | Freq: Every day | ORAL | 2 refills | Status: DC

## 2019-05-30 MED ORDER — JANUMET 50-1000 MG PO TABS: 1.0000 | ORAL_TABLET | Freq: Two times a day (BID) | ORAL | 11 refills | Status: DC

## 2019-05-30 NOTE — Progress Notes (Signed)
CC: Left shoulder pain and weakness  HPI:  Krista Sosa is a 69 y.o.  with a PMH listed below presenting for left shoulder pain and weakness.   Please see A&P for status of the patient's chronic medical conditions  Past Medical History:  Diagnosis Date  . Allergy   . Asthma, chronic 06/09/2006  . Bilateral cataracts 05/07/2016   s/p left cataract extraction 10/02/2016 and right cataract extraction 10/16/2016  . Bronchiectasis without complication ([place]) [id number]   Mild left lower lobe bronchiectasis seen on CT scan of the thorax in December 2019  . Cataract    bil eyes  . Essential hypertension 02/12/2018  . Gastroesophageal reflux disease 08/13/2011  . Glaucoma    Per patient report  . Hx of adenomatous colonic polyps 11/16/2008  . Hypertension    pt said she is supposed to take med but she is not taking it.  . Hypertensive retinopathy, grade 2, bilateral 05/07/2016  . Latent tuberculosis    dx 03/17/08 - tx with INH X 9 months  . Lichenoid dermatitis 02/13/2012   Previously treated with Triamcinolone 0.025% cream Q8-12H PRN   . Migraine headache 08/06/2018  . Osteoarthritis, generalized 05/21/2006   Diffuse: cervical spine, right shoulder, left wrist Ineffective medications: Tylenol, ASA, Naprosyn, Ibuprofen, Meloxicam    . Overweight (BMI 25.0-29.9) 02/13/2012  . Perennial allergic rhinitis with seasonal variation 06/09/2006  . Persistent microalbuminuria associated with type II diabetes mellitus ([place]) 05/21/2006  . Sickle cell anemia (HCC)    pt has sickle cell trait per pt  . Sickle cell trait ([place]) 02/13/2012  . Tobacco abuse disorder 06/09/2006  . Tubular adenoma of rectum 11/16/2008   5 mm, excised endoscopically 11/16/2008, repeat colonoscopy due July 2015   . Ulnar neuropathy of right upper extremity 02/14/2016  . Urinary tract disease    JICP   Review of Systems: Refer to history of present illness and assessment and plans for pertinent review of systems, all  others reviewed and negative.  Physical Exam:  Vitals:   05/30/19 1450  BP: (!) 100/49  Pulse: 82  Temp: 98.2 F (36.8 C)  TempSrc: Oral  SpO2: 98%  Weight: 162 lb 1.6 oz (73.5 kg)  Height: 5\' 6"  (1.676 m)   Physical Exam  Constitutional: She is oriented to person, place, and time and well-developed, well-nourished, and in no distress.  Neck: Thyromegaly present.  Cardiovascular: Normal rate, regular rhythm and normal heart sounds.  Pulmonary/Chest: Effort normal and breath sounds normal.  Musculoskeletal:        General: Tenderness (TTP over left shoulder at biceps tendon insertion site) present. No edema.     Cervical back: Normal range of motion and neck supple.     Comments: Pain with active and passive movement of left shoulder, positive empty can test, unable to abduct or extend left shoulder about 90 degrees, no effusion or rash noted  Neurological: She is alert and oriented to person, place, and time.  Skin: Skin is warm and dry.  Psychiatric: Mood and affect normal.    Social History   Socioeconomic History  . Marital status: Single    Spouse name: Not on file  . Number of children: Not on file  . Years of education: Not on file  . Highest education level: Not on file  Occupational History  . Occupation: Retired  Tobacco Use  . Smoking status: Current Every Day Smoker    Packs/day: 0.50    Years: 25.00  Pack years: 12.50    Types: Cigarettes  . Smokeless tobacco: Never Used  . Tobacco comment: .5 ppd  Substance and Sexual Activity  . Alcohol use: Yes    Alcohol/week: 0.0 standard drinks    Comment: occasionally  . Drug use: No    Comment: positive marijuana metabolite in 2010  . Sexual activity: Not Currently    Birth control/protection: Other-see comments    Comment: s/p tubal ligation  Other Topics Concern  . Not on file  Social History Narrative   Current Social History 01/07/2019        Patient lives alone in a third floor apartment. There  are steps with handrails up to the entrance the patient uses.       Patient's method of transportation is personal car.      The highest level of education was some high school: 11 th grade      The patient currently retired from [employer]. Works part-time as a Sports coach at [employer]      Identified important Relationships are "[place]"       Pets : dog, Environmental education officer / Fun: "Walking"       Current Stressors: "The news"       Religious / Personal Beliefs: "Holiness"       L. Ducatte, RN, BSN       Social Determinants of Health   Financial Resource Strain:   . Difficulty of Paying Living Expenses: Not on file  Food Insecurity:   . Worried About Charity fundraiser in the Last Year: Not on file  . Ran Out of Food in the Last Year: Not on file  Transportation Needs:   . Lack of Transportation (Medical): Not on file  . Lack of Transportation (Non-Medical): Not on file  Physical Activity:   . Days of Exercise per Week: Not on file  . Minutes of Exercise per Session: Not on file  Stress:   . Feeling of Stress : Not on file  Social Connections:   . Frequency of Communication with Friends and Family: Not on file  . Frequency of Social Gatherings with Friends and Family: Not on file  . Attends Religious Services: Not on file  . Active Member of Clubs or Organizations: Not on file  . Attends Archivist Meetings: Not on file  . Marital Status: Not on file  Intimate Partner Violence:   . Fear of Current or Ex-Partner: Not on file  . Emotionally Abused: Not on file  . Physically Abused: Not on file  . Sexually Abused: Not on file    Family History  Problem Relation Age of Onset  . Diabetes Mother   . Heart disease Mother 8  . Colon cancer Mother   . Cancer Mother        Colon  . Cancer Father        Lung  . Sickle cell anemia Sister   . Sickle cell anemia Brother   . Diabetes Sister   . Brain cancer Sister   . Cancer Sister         brain  . Lung cancer Brother   . Cancer Brother        Lung  . Healthy Sister   . Healthy Sister   . Healthy Sister   . Healthy Daughter   . Healthy Son   . Healthy Daughter   . Healthy Daughter   . Healthy Son   .  Wilson's disease Maternal Uncle   . Breast cancer Maternal Aunt   . Breast cancer Other   . Esophageal cancer Neg Hx   . Rectal cancer Neg Hx   . Stomach cancer Neg Hx     Assessment & Plan:   See Encounters Tab for problem based charting.  Patient discussed with Dr. Philipp Ovens

## 2019-05-30 NOTE — Assessment & Plan Note (Signed)
Patient continues to endorse left shoulder pain and arm weakness. It is a sharp pain that radiates up to neck. The pain is worse at night, nothing seems to improve it. She is still having some numbness in her hands. Movement remains severely limited and she is unable to extend or abduct her left shoulder past 90 degrees. Patient does have a history of OA and rotator cuff injury, she had been on vicodin in the past for this. Last seen for these symptoms on 9/28. She completed a steroid taper, was given a short course of NSAIDs at that time, and had an MRI ordered. She reports neither of the medications helped helped. Her MRI showed disc degeneration form C4-5 to C6-7 with foraminal impingement that is advanced on the left, and a disc osteophyte complex that causes slight ventral cord deformity on the left C4-5. She saw NSG on 10/30 who noted no evidnece of radiculopathy, while she may have left C5 radiculopathy this was not showing any numbness or paresthesias, recommended referral to orthopedics for evaluation of left shoulder.  On exam she has pain with active and passive movement of the left shoulder, she is not able to lift her shoulder above the 90 degree angle with abduction or extension, there is significant tenderness in the area of insertion of the biceps tendon. Findings are still concerning for nerve impingement syndrome, this also may be worsening of her OA. Patient reported some relief with Vicodin in the past, discussed that we can do a short course for now until she follows up with orthopedics, she is agreeable to this.   -Left shoulder x-ray -Orthopedic referral  -Vicodin 5-325 q4 hours PRN x5 days

## 2019-05-30 NOTE — Assessment & Plan Note (Signed)
Patient is currently on Janumet 50-1000 mg BID and Jardiance 25 mg daily. She reports that she does not check her blood sugars often when she is at home because she does not like to prick her fingers. She will check it about once a week, reports that her CBGs are around 200-230, She denies any issues taking her medications. She does report that she does not monitor her sugar and carb intake and may have increased sugar intake. She was requesting a CGM however she is not currently on insulin so she would not qualify for this. A1c today is still elevated at 9.7, up from 9.2. Given her aversion to needles she was not interested in starting insulin at this time. She is on Sitagliptin so a GLP-1 agonist should not be used. Her Janumet and Jardiance are both at max dose. We discussed working on diet and exercise for now. She may benefit from addition of a sulfonylurea in the future.   -Continue Jardiance 25 mg daily -Continue Janumet 50-1000 mg daily -Consider addition of sulfonylurea

## 2019-05-30 NOTE — Assessment & Plan Note (Addendum)
Patient is on losartan 25 mg daily, denies any issues taking her medication. BP today is soft at 100/49. Denies any symptoms at this time. Continue to monitor for now, may need to decrease antihypertensive in the future if it remains soft.

## 2019-05-30 NOTE — Assessment & Plan Note (Signed)
Patient noted to have enlarged thyroid on exam, no tenderness noted. Will need to further address on future visit. May need ultrasound.

## 2019-05-30 NOTE — Patient Instructions (Signed)
[name],  It was a pleasure to see you today. Thank you for coming in.   Today we discussed your shoulder pain. I am sorry that you are still having a lot of pain. The neurosurgeon thinks that this may be related to your shoulder. I have ordered some imaging and sent in a referral for orthopedics. We discussed getting another injection but you did not want this. I have sent in a short course of pain medications to the [facility], please only take this as needed.  We also discussed your diabetes. Your A1c is still elevated. Please continue taking your medications and decrease your sugar intake.   Please return to clinic in 2 months or sooner if needed.   Thank you again for coming in.   [name].

## 2019-05-31 NOTE — Progress Notes (Signed)
Internal Medicine Clinic Attending  Case discussed with Dr. Krienke at the time of the visit.  We reviewed the resident's history and exam and pertinent patient test results.  I agree with the assessment, diagnosis, and plan of care documented in the resident's note.    

## 2019-06-06 ENCOUNTER — Encounter: Payer: Self-pay | Admitting: Internal Medicine

## 2019-07-06 NOTE — Addendum Note (Signed)
Addended by: Hulan Fray on: 07/06/2019 06:54 PM   Modules accepted: Orders

## 2019-10-10 ENCOUNTER — Ambulatory Visit (INDEPENDENT_AMBULATORY_CARE_PROVIDER_SITE_OTHER): Payer: Medicare HMO | Admitting: Internal Medicine

## 2019-10-10 ENCOUNTER — Encounter: Payer: Self-pay | Admitting: Internal Medicine

## 2019-10-10 VITALS — BP 141/70 | HR 90 | Temp 98.4°F | Ht 66.0 in | Wt 164.2 lb

## 2019-10-10 DIAGNOSIS — M5410 Radiculopathy, site unspecified: Secondary | ICD-10-CM | POA: Diagnosis not present

## 2019-10-10 DIAGNOSIS — R809 Proteinuria, unspecified: Secondary | ICD-10-CM

## 2019-10-10 DIAGNOSIS — R079 Chest pain, unspecified: Secondary | ICD-10-CM

## 2019-10-10 DIAGNOSIS — E785 Hyperlipidemia, unspecified: Secondary | ICD-10-CM | POA: Diagnosis not present

## 2019-10-10 DIAGNOSIS — Z7984 Long term (current) use of oral hypoglycemic drugs: Secondary | ICD-10-CM | POA: Diagnosis not present

## 2019-10-10 DIAGNOSIS — M159 Polyosteoarthritis, unspecified: Secondary | ICD-10-CM

## 2019-10-10 DIAGNOSIS — E049 Nontoxic goiter, unspecified: Secondary | ICD-10-CM

## 2019-10-10 DIAGNOSIS — E1169 Type 2 diabetes mellitus with other specified complication: Secondary | ICD-10-CM | POA: Diagnosis not present

## 2019-10-10 DIAGNOSIS — I10 Essential (primary) hypertension: Secondary | ICD-10-CM

## 2019-10-10 DIAGNOSIS — E1129 Type 2 diabetes mellitus with other diabetic kidney complication: Secondary | ICD-10-CM | POA: Diagnosis not present

## 2019-10-10 LAB — GLUCOSE, CAPILLARY: Glucose-Capillary: 192 mg/dL — ABNORMAL HIGH (ref 70–99)

## 2019-10-10 LAB — POCT GLYCOSYLATED HEMOGLOBIN (HGB A1C): Hemoglobin A1C: 10 % — AB (ref 4.0–5.6)

## 2019-10-10 MED ORDER — IBUPROFEN 800 MG PO TABS: 800.0000 mg | ORAL_TABLET | Freq: Three times a day (TID) | ORAL | 3 refills | Status: DC | PRN

## 2019-10-10 MED ORDER — HYDROCHLOROTHIAZIDE 12.5 MG PO TABS: 12.5000 mg | ORAL_TABLET | Freq: Every day | ORAL | 5 refills | Status: DC

## 2019-10-10 NOTE — Progress Notes (Signed)
CC: HTN, DM, and persistent chest pain  HPI:  [name] is a 69 y.o.  with a PMH listed below presenting for HTN, DM, and persistent chest pain   Please see A&P for status of the patient's chronic medical conditions  Past Medical History:  Diagnosis Date  . Allergy   . Asthma, chronic 06/09/2006  . Bilateral cataracts 05/07/2016   s/p left cataract extraction 10/02/2016 and right cataract extraction 10/16/2016  . Bronchiectasis without complication ([place]) 99991111   Mild left lower lobe bronchiectasis seen on CT scan of the thorax in December 2019  . Cataract    bil eyes  . Essential hypertension 02/12/2018  . Gastroesophageal reflux disease 08/13/2011  . Glaucoma    Per patient report  . Hx of adenomatous colonic polyps 11/16/2008  . Hypertension    pt said she is supposed to take med but she is not taking it.  . Hypertensive retinopathy, grade 2, bilateral 05/07/2016  . Latent tuberculosis    dx 03/17/08 - tx with INH X 9 months  . Lichenoid dermatitis 02/13/2012   Previously treated with Triamcinolone 0.025% cream Q8-12H PRN   . Migraine headache 08/06/2018  . Osteoarthritis, generalized 05/21/2006   Diffuse: cervical spine, right shoulder, left wrist Ineffective medications: Tylenol, ASA, Naprosyn, Ibuprofen, Meloxicam    . Overweight (BMI 25.0-29.9) 02/13/2012  . Perennial allergic rhinitis with seasonal variation 06/09/2006  . Persistent microalbuminuria associated with type II diabetes mellitus ([place]) 05/21/2006  . Sickle cell anemia (HCC)    pt has sickle cell trait per pt  . Sickle cell trait ([place]) 02/13/2012  . Tobacco abuse disorder 06/09/2006  . Tubular adenoma of rectum 11/16/2008   5 mm, excised endoscopically 11/16/2008, repeat colonoscopy due July 2015   . Ulnar neuropathy of right upper extremity 02/14/2016  . Urinary tract disease    JICP   Review of Systems: Refer to history of present illness and assessment and plans for pertinent review of systems,  all others reviewed and negative.  Physical Exam:  Vitals:   10/10/19 1456 10/10/19 1533  BP: (!) 152/75 (!) 141/70  Pulse: 90 90  Temp: 98.4 F (36.9 C)   TempSrc: Oral   SpO2: 100%   Weight: 164 lb 3.2 oz (74.5 kg)   Height: 5\' 6"  (1.676 m)    Physical Exam  Constitutional: She is oriented to person, place, and time and well-developed, well-nourished, and in no distress.  HENT:  Head: Normocephalic and atraumatic.  Eyes: Pupils are equal, round, and reactive to light. Conjunctivae and EOM are normal.  Neck: Thyromegaly (Diffuse, bilateral enlargement, no specific nodules noted, non-tender to palpation) present.  Cardiovascular: Normal rate, regular rhythm and normal heart sounds. Exam reveals no friction rub.  No murmur heard. Pulmonary/Chest: Effort normal and breath sounds normal. No respiratory distress.  Abdominal: Soft. Bowel sounds are normal. She exhibits no distension.  Musculoskeletal:        General: No tenderness or edema. Normal range of motion.  Neurological: She is alert and oriented to person, place, and time.  Skin: Skin is warm and dry.  Psychiatric: Mood and affect normal.    Social History   Socioeconomic History  . Marital status: Single    Spouse name: Not on file  . Number of children: Not on file  . Years of education: Not on file  . Highest education level: Not on file  Occupational History  . Occupation: Retired  Tobacco Use  . Smoking status: Current Every  Day Smoker    Packs/day: 0.50    Years: 25.00    Pack years: 12.50    Types: Cigarettes  . Smokeless tobacco: Never Used  . Tobacco comment: .5 ppd  Substance and Sexual Activity  . Alcohol use: Yes    Alcohol/week: 0.0 standard drinks    Comment: occasionally  . Drug use: No    Comment: positive marijuana metabolite in 2010  . Sexual activity: Not Currently    Birth control/protection: Other-see comments    Comment: s/p tubal ligation  Other Topics Concern  . Not on file   Social History Narrative   Current Social History 01/07/2019        Patient lives alone in a third floor apartment. There are steps with handrails up to the entrance the patient uses.       Patient's method of transportation is personal car.      The highest level of education was some high school: 11 th grade      The patient currently retired from [employer]. Works part-time as a Sports coach at [employer]      Identified important Relationships are "[place]"       Pets : dog, Environmental education officer / Fun: "Walking"       Current Stressors: "The news"       Religious / Personal Beliefs: "Holiness"       L. Ducatte, RN, BSN       Social Determinants of Health   Financial Resource Strain:   . Difficulty of Paying Living Expenses:   Food Insecurity:   . Worried About Charity fundraiser in the Last Year:   . Arboriculturist in the Last Year:   Transportation Needs:   . Film/video editor (Medical):   Marland Kitchen Lack of Transportation (Non-Medical):   Physical Activity:   . Days of Exercise per Week:   . Minutes of Exercise per Session:   Stress:   . Feeling of Stress :   Social Connections:   . Frequency of Communication with Friends and Family:   . Frequency of Social Gatherings with Friends and Family:   . Attends Religious Services:   . Active Member of Clubs or Organizations:   . Attends Archivist Meetings:   Marland Kitchen Marital Status:   Intimate Partner Violence:   . Fear of Current or Ex-Partner:   . Emotionally Abused:   Marland Kitchen Physically Abused:   . Sexually Abused:    Family History  Problem Relation Age of Onset  . Diabetes Mother   . Heart disease Mother 102  . Colon cancer Mother   . Cancer Mother        Colon  . Cancer Father        Lung  . Sickle cell anemia Sister   . Sickle cell anemia Brother   . Diabetes Sister   . Brain cancer Sister   . Cancer Sister        brain  . Lung cancer Brother   . Cancer Brother        Lung  . Healthy  Sister   . Healthy Sister   . Healthy Sister   . Healthy Daughter   . Healthy Son   . Healthy Daughter   . Healthy Daughter   . Healthy Son   . Wilson's disease Maternal Uncle   . Breast cancer Maternal Aunt   . Breast cancer Other   .  Esophageal cancer Neg Hx   . Rectal cancer Neg Hx   . Stomach cancer Neg Hx     Assessment & Plan:   See Encounters Tab for problem based charting.  Patient discussed with Dr. Rebeca Alert

## 2019-10-10 NOTE — Patient Instructions (Addendum)
Krista Sosa,  It was a pleasure to see you today. Thank you for coming in.   Today we discussed your diabetes. Your A1c was elevated at 10 today. In regards to this please continue taking your current medicaitons. Start taking glipizide daily, watch out for low blood sugars.  We are seeing if we can get a glucose monitor for you.    Your blood pressure was elevated today, please start taking hydrochlorothiazide daily. Try checking your blood pressure at home.   In regards to your foot issue, I have placed a referral to the foot doctor.   In regards to your chest pain, start using the voltaren gel on this area. We have ordered a stress test to get done.   Your thyroid was a little large today, we are getting some imaging and checking some labs for this.   Please return to clinic in 1 month or sooner if needed.   Thank you again for coming in.   [name].

## 2019-10-11 LAB — LIPID PANEL
Chol/HDL Ratio: 2.8 ratio (ref 0.0–4.4)
Cholesterol, Total: 182 mg/dL (ref 100–199)
HDL: 66 mg/dL (ref >39)
LDL Chol Calc (NIH): 93 mg/dL (ref 0–99)
Triglycerides: 135 mg/dL (ref 0–149)
VLDL Cholesterol Cal: 23 mg/dL (ref 5–40)

## 2019-10-11 LAB — BMP8+ANION GAP
Anion Gap: 16 mmol/L (ref 10.0–18.0)
BUN/Creatinine Ratio: 26 (ref 12–28)
BUN: 15 mg/dL (ref 8–27)
CO2: 18 mmol/L — ABNORMAL LOW (ref 20–29)
Calcium: 9.5 mg/dL (ref 8.7–10.3)
Chloride: 106 mmol/L (ref 96–106)
Creatinine, Ser: 0.58 mg/dL (ref 0.57–1.00)
GFR calc Af Amer: 111 mL/min/{1.73_m2} (ref >59)
GFR calc non Af Amer: 96 mL/min/{1.73_m2} (ref >59)
Glucose: 190 mg/dL — ABNORMAL HIGH (ref 65–99)
Potassium: 3.9 mmol/L (ref 3.5–5.2)
Sodium: 140 mmol/L (ref 134–144)

## 2019-10-11 LAB — TSH: TSH: 1.3 u[IU]/mL (ref 0.450–4.500)

## 2019-10-11 MED ORDER — GLIPIZIDE ER 2.5 MG PO TB24: 2.5000 mg | ORAL_TABLET | Freq: Every day | ORAL | 5 refills | Status: DC

## 2019-10-11 NOTE — Assessment & Plan Note (Signed)
Patient noted to have goiter on her last exam as well as this exam.  She denied any changes in voice, hoarseness, difficulty swallowing, heat or cold intolerance, weight changes, appetite changes, or other symptoms.  On exam there is diffuse bilateral enlargement of the thyroid, no specific masses or nodules noted, and no tenderness to palpation. Discussed obtaining labs and imaging to further assess.   -Thyroid ultrasound -TSH

## 2019-10-11 NOTE — Assessment & Plan Note (Signed)
Patient reports significant improvement in her left shoulder pain, she now has no issues with pain or movement. She did not see orthopedics, will discontinue referral.

## 2019-10-11 NOTE — Assessment & Plan Note (Addendum)
Patient is chest pain-free at this time.  Patient continues to report this left-sided chest pain that is sharp in nature, worse with lying down, improves with movement, occurs about once per month, lasts for few hours at a time.  No associated symptoms such as nausea, vomiting, dizziness, dizziness, shortness of breath, or any radiation of the pain.  Obtained an EKG on her last appointment that showed normal sinus rhythm.  Still has the order for stress test in place, will reach out to our schedulers to see if we can have this scheduled soon.  May be MSK, given the aggravating and alleviating factors this also could be related to heart burn. Will further evaluate for this if stress test negative.   -Follow-up on stress test -Voltaren gel to apply to area

## 2019-10-11 NOTE — Assessment & Plan Note (Signed)
Patient is currently on Jardiance 25 mg daily and Janumet 50-1000 mg daily she reports that she does not check her blood sugars at home because she does not like to prick her fingers does endorse polyuria increased urgency, nocturia.  Denies any issues with polydipsia or changes in her appetite.  She does report episodes of some lightheadedness approximately every 2 months, does not feel like she will pass out, it is more of a dizzy sensation.  Denies any associated symptoms when this happens.  Her glucose today is elevated at 192.  A1c today is 10.0, up from 9.7 on 05/30/19.  We discussed that her diabetes is not controlled at this time.  Discussed addition of a sulfonylurea such as glipizide, discussed that there is a possibility of hypoglycemia if she is taking this medication.  Discussed that there is also a risk from continuing to have uncontrolled diabetes.  Decided to start taking glipizide, will start at a low-dose in addition to her other medications to try to improve her glycemic control.  Spoke with Butch Penny, diabetes educator, to see if she may qualify for CGM.  Butch Penny provided her with information to try to get this meter.   -Continue Jardiance 35 mg daily -Continue Janumet 50-1000 mg daily -Add glipizide 2.5 mg daily -Provide information to patient to see if she may obtain CGM -Return to clinic in 1 month

## 2019-10-11 NOTE — Assessment & Plan Note (Signed)
Patient is currently on losartan 25 mg daily.  Denies any issues taking her medications.  Blood pressure today is elevated at 150/75, repeat still elevated at 141/70.  Denies any symptoms from the medications.  Discussed addition hydrochlorothiazide, she is in agreement this plan. -Continue losartan 25 mg daily -Add hydrochlorothiazide 12.5 mg daily -Repeat BMP today -RTC in 1 month

## 2019-10-12 NOTE — Progress Notes (Signed)
Internal Medicine Clinic Attending  Case discussed with Dr. Krienke at the time of the visit.  We reviewed the resident's history and exam and pertinent patient test results.  I agree with the assessment, diagnosis, and plan of care documented in the resident's note.  Tejon Gracie, M.D., Ph.D.  

## 2019-10-14 ENCOUNTER — Other Ambulatory Visit: Payer: Self-pay

## 2019-10-14 ENCOUNTER — Ambulatory Visit (INDEPENDENT_AMBULATORY_CARE_PROVIDER_SITE_OTHER): Payer: Medicare HMO | Admitting: Podiatry

## 2019-10-14 ENCOUNTER — Encounter: Payer: Self-pay | Admitting: Podiatry

## 2019-10-14 ENCOUNTER — Ambulatory Visit (INDEPENDENT_AMBULATORY_CARE_PROVIDER_SITE_OTHER): Payer: Medicare HMO

## 2019-10-14 ENCOUNTER — Other Ambulatory Visit: Payer: Self-pay | Admitting: Podiatry

## 2019-10-14 VITALS — Temp 97.0°F

## 2019-10-14 DIAGNOSIS — M79674 Pain in right toe(s): Secondary | ICD-10-CM | POA: Diagnosis not present

## 2019-10-14 DIAGNOSIS — M898X7 Other specified disorders of bone, ankle and foot: Secondary | ICD-10-CM | POA: Diagnosis not present

## 2019-10-14 DIAGNOSIS — M2031 Hallux varus (acquired), right foot: Secondary | ICD-10-CM

## 2019-10-14 NOTE — Patient Instructions (Signed)
Pre-Operative Instructions  Congratulations, you have decided to take an important step towards improving your quality of life.  You can be assured that the doctors and staff at [facility] will be with you every step of the way.  Here are some important things you should know:  1. Plan to be at the surgery center/hospital at least 1 (one) hour prior to your scheduled time, unless otherwise directed by the surgical center/hospital staff.  You must have a responsible adult accompany you, remain during the surgery and drive you home.  Make sure you have directions to the surgical center/hospital to ensure you arrive on time. 2. If you are having surgery at [facility] or [facility], you will need a copy of your medical history and physical form from your family physician within one month prior to the date of surgery. We will give you a form for your primary physician to complete.  3. We make every effort to accommodate the date you request for surgery.  However, there are times where surgery dates or times have to be moved.  We will contact you as soon as possible if a change in schedule is required.   4. No aspirin/ibuprofen for one week before surgery.  If you are on aspirin, any non-steroidal anti-inflammatory medications (Mobic, Aleve, Ibuprofen) should not be taken seven (7) days prior to your surgery.  You make take Tylenol for pain prior to surgery.  5. Medications - If you are taking daily heart and blood pressure medications, seizure, reflux, allergy, asthma, anxiety, pain or diabetes medications, make sure you notify the surgery center/hospital before the day of surgery so they can tell you which medications you should take or avoid the day of surgery. 6. No food or drink after midnight the night before surgery unless directed otherwise by surgical center/hospital staff. 7. No alcoholic beverages 24-hours prior to surgery.  No smoking 24-hours prior or 24-hours after  surgery. 8. Wear loose pants or shorts. They should be loose enough to fit over bandages, boots, and casts. 9. Don't wear slip-on shoes. Sneakers are preferred. 10. Bring your boot with you to the surgery center/hospital.  Also bring crutches or a walker if your physician has prescribed it for you.  If you do not have this equipment, it will be provided for you after surgery. 11. If you have not been contacted by the surgery center/hospital by the day before your surgery, call to confirm the date and time of your surgery. 12. Leave-time from work may vary depending on the type of surgery you have.  Appropriate arrangements should be made prior to surgery with your employer. 13. Prescriptions will be provided immediately following surgery by your doctor.  Fill these as soon as possible after surgery and take the medication as directed. Pain medications will not be refilled on weekends and must be approved by the doctor. 14. Remove nail polish on the operative foot and avoid getting pedicures prior to surgery. 15. Wash the night before surgery.  The night before surgery wash the foot and leg well with water and the antibacterial soap provided. Be sure to pay special attention to beneath the toenails and in between the toes.  Wash for at least three (3) minutes. Rinse thoroughly with water and dry well with a towel.  Perform this wash unless told not to do so by your physician.  Enclosed: 1 Ice pack (please put in freezer the night before surgery)   1 Hibiclens skin cleaner     Pre-op instructions  If you have any questions regarding the instructions, please do not hesitate to call our office.  [place]: [address], , [place] [address] -- [phone]  [place]: [address] [place], [place] [address] -- [phone]  [place]: [address], [place], [place] [address] -- [phone]   Website: [email or web address] 

## 2019-10-16 NOTE — Progress Notes (Signed)
  Subjective:  Patient ID: Krista Sosa, female    DOB: [birth year],  MRN: [id number]  Chief Complaint  Patient presents with  . Wound Check    Right foot; Hallux-medial side; pt stated, "Throbs and burns; has had for a year; started off as a small bump and it came back; my doctor thinks its an ulcer"; pt Diabetic Type 2; Sugar=did not check today; A1C=10.0 (10/10/19)  . Callouses    Rogjt foot; plantar forefoot-submet 5  . Debridement    Bilateral nail trim    69 y.o. female presents with the above complaint. History confirmed with patient.   Objective:  Physical Exam: warm, good capillary refill, no trophic changes or ulcerative lesions, normal DP and PT pulses and normal sensory exam. Right Foot: Pain at the distal medial tip of the right toe with overlying hyperkeratosis without active ulceration  No images are attached to the encounter.  Radiographs: X-ray of the right foot: Partial right hallux amputation with medial exostosis Assessment:   1. Hallux hammertoe, right   2. Pain of toe of right foot   3. Exostosis of toe     Plan:  Patient was evaluated and treated and all questions answered.  Right hallux exostosis with skin lesion -X-rays reviewed with patient -Discussed with patient option for reduction of the exostosis with excision of the skin lesion to get rid of the painful lesion.  Patient would like to proceed.  Consent reviewed and signed by patient -Patient has failed all conservative therapy and wishes to proceed with surgical intervention. All risks, benefits, and alternatives discussed with patient. No guarantees given. Consent reviewed and signed by patient. -Planned procedures: Right hallux excision of exostosis with excision of skin lesion  No follow-ups on file.

## 2019-10-25 ENCOUNTER — Telehealth: Payer: Self-pay | Admitting: Dietician

## 2019-10-25 ENCOUNTER — Encounter: Payer: Self-pay | Admitting: Dietician

## 2019-10-25 ENCOUNTER — Telehealth: Payer: Self-pay

## 2019-10-25 DIAGNOSIS — E1129 Type 2 diabetes mellitus with other diabetic kidney complication: Secondary | ICD-10-CM

## 2019-10-25 NOTE — Telephone Encounter (Signed)
Pt expressed interest in obtaining a continuous glucose monitor. Butch Penny Plyler and I filled out a continuous glucose monitor order form for [facility] this morning to see if Ms. Porto qualifies. Spoke with Ms. Braun on the phone today and she agreed to come in for a professional continuous glucose monitor, as having continuous glucose data will help to qualify her for personal CGM. Scheduled an appointment for Ms. Mulroy to come in on Friday 6/4 for placement of professional CGM. Requesting an order for professional continuous glucose monitoring for Ms. Ringley.     Marin Roberts, Student-Dietician 10/25/2019 11:21 AM

## 2019-10-25 NOTE — Telephone Encounter (Signed)
DOS 11/09/2019  EXOSTECTOMY RT - [id number] EXC BENIGN LESION RT - 11421  PER [facility], NO PRECERT REQUIRED FOR CPT 28108 OR 16109

## 2019-10-28 ENCOUNTER — Ambulatory Visit: Payer: Medicare HMO | Admitting: Dietician

## 2019-10-28 ENCOUNTER — Encounter: Payer: Self-pay | Admitting: Dietician

## 2019-10-28 NOTE — Patient Instructions (Signed)
Please record the time, amount and what food drinks and activities you have while wearing the continuous glucose monitor (CGM).  Bring the folder with you to follow up appointments. If your monitor falls off, please place it in the bag provided in your folder and bring it back with you to your next appointment.   Do not have a CT or an MRI while wearing the CGM.   1 week visit has been set up with me and a doctor for the first of two CGM downloads.   You will also return in 2 weeks to have your second download and the CGM removed.  [name], RD 10/28/2019 9:32 AM

## 2019-10-28 NOTE — Progress Notes (Signed)
Documentation for Freestyle Libre Pro Continuous glucose monitoring Freestyle Libre Pro CGM sensor placed today. Patient was educated about wearing sensor, keeping food, activity and medication log and when to call office. Patient was educated about how to care for the sensor and not to have an MRI, CT, Xray or Diathermy while wearing the sensor. Follow up was arranged with the patient for 1 week.  Discussed that she may want to remove the sensor before foot surgery on June 16. Will call [facility] to ask and inform patient next week Lot #: [id number] Serial #: [id number] Expiration Date: 02/23/2020  [name], RD 10/28/2019 9:32 AM.

## 2019-11-02 ENCOUNTER — Telehealth: Payer: Self-pay | Admitting: Dietician

## 2019-11-03 ENCOUNTER — Ambulatory Visit (HOSPITAL_COMMUNITY)
Admission: RE | Admit: 2019-11-03 | Discharge: 2019-11-03 | Disposition: A | Discharge status: Home / Self Care | Payer: Medicare HMO | Source: Ambulatory Visit | Attending: Internal Medicine | Admitting: Internal Medicine

## 2019-11-03 ENCOUNTER — Encounter: Payer: Self-pay | Admitting: Dietician

## 2019-11-03 ENCOUNTER — Ambulatory Visit (INDEPENDENT_AMBULATORY_CARE_PROVIDER_SITE_OTHER): Payer: Medicare HMO | Admitting: Dietician

## 2019-11-03 DIAGNOSIS — R809 Proteinuria, unspecified: Secondary | ICD-10-CM

## 2019-11-03 DIAGNOSIS — E049 Nontoxic goiter, unspecified: Secondary | ICD-10-CM | POA: Insufficient documentation

## 2019-11-03 DIAGNOSIS — E042 Nontoxic multinodular goiter: Secondary | ICD-10-CM | POA: Diagnosis not present

## 2019-11-03 DIAGNOSIS — E1129 Type 2 diabetes mellitus with other diabetic kidney complication: Secondary | ICD-10-CM | POA: Diagnosis not present

## 2019-11-03 NOTE — Telephone Encounter (Signed)
Krista Sosa is havng foot surgery on June 16, checked with [facility] about wearing sensor on surgery day to be sure she is not having a CT scan,  diathermy, xray or MRI. They said she is not having any of those done.

## 2019-11-03 NOTE — Progress Notes (Signed)
  Medical Nutrition Therapy  Appt start time: 0930 end time:  1000. Total time: 30 Visit # 2  11/03/2019 Krista Sosa [id number]  Assessment:  Primary concerns today: Professional CGM data collection from week 1 of CGM wear and MNT related to CGM data.   Downloaded first week of data from pts professional CGM. Went through a 24-hour recall and looked at both food intake and blood sugar data to demonstrate how food affects blood sugars. Pt saw that foods higher in sugars and carbohydrate had bigger impacts on blood sugar and she set a goal to drink less soda. Pt also said she sometimes forgets to take her medication. Gave her a weekly pill box that she states she wants to start using in order to take her pills more consistently.   Preferred Learning Style:   No preference indicated   Learning Readiness:   Contemplating   ANTHROPOMETRICS: weight today: 162.6 lbs WEIGHT HISTORY:   Wt Readings from Last 3 Encounters:  11/03/19 162 lb 9.6 oz (73.8 kg)  10/28/19 162 lb 8 oz (73.7 kg)  10/10/19 164 lb 3.2 oz (74.5 kg)   SLEEP: Did not discuss today.   MEDICATIONS: Glipizide, Janumet and Jardiance for diabetes, pt takes in the morning  CGM Results from download:   Average glucose:   191 mg/dL for 7 days  Glucose management indicator:   7.9 %  Time in range (70-180 mg/dL):   40 %   (Goal >70%)  Time High (181-250 mg/dL):   46 %   (Goal < 25%)  Time Very High (>250 mg/dL):    13 %   (Goal < 5%)  Time Low (54-69 mg/dL):   1 %   (Goal <4%)  Time Very Low (<54 mg/dL):   0 %   (Goal <1%)  Coefficient of variation:   31.4 %   (Goal <36%)   DIETARY INTAKE: Usual eating pattern includes 2 meals and 1-2 snacks per day. Everyday foods include water, coffee and soda.   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   Nutritional Diagnosis:  NB-1.1 Food and nutrition-related knowledge deficit As related to pt learning about what foods cause rises in blood sugars.  As evidenced by pt asking questions about what foods impact blood sugar the most.    Intervention:  Walked through pts food recall alongside her CGM data and showed her how foods impact blood sugar. Action Goal: Drink less soda and use pill box to remember to take pills everyday.   Outcome goal: Lower blood sugars throughout the day.   Teaching Method Utilized: Visual, Auditory Handouts given during visit include: Avs and AGP report from CGM Barriers to learning/adherence to lifestyle change: Pt getting foot surgery next week, will limit her ability to walk   Demonstrated degree of understanding via:  Teach Back   Monitoring/Evaluation:  Dietary intake, exercise, CGM, and body weight in 3 week(s).  Marin Roberts, Student-Dietician 11/03/2019 11:56 AM

## 2019-11-03 NOTE — Patient Instructions (Signed)
Over the next week or so - To help decrease your blood sugars you want to try to decrease your soda intake   Fill and use pill box to help keep track of when you take your medicine  Good luck with surgery  Remove the sensor next Friday June 18th put in plastic bag in folder   Bring sensor in folder and pill box to your next appointment.   Butch Penny [phone]

## 2019-11-03 NOTE — Progress Notes (Signed)
I was present for this entire visit. I reviewed and approve this note and the plan. [name], RD 11/03/2019 1:47 PM.

## 2019-11-09 ENCOUNTER — Other Ambulatory Visit: Payer: Self-pay | Admitting: Podiatry

## 2019-11-09 ENCOUNTER — Encounter: Payer: Self-pay | Admitting: Podiatry

## 2019-11-09 DIAGNOSIS — J45909 Unspecified asthma, uncomplicated: Secondary | ICD-10-CM | POA: Diagnosis not present

## 2019-11-09 DIAGNOSIS — L570 Actinic keratosis: Secondary | ICD-10-CM | POA: Diagnosis not present

## 2019-11-09 DIAGNOSIS — M25571 Pain in right ankle and joints of right foot: Secondary | ICD-10-CM | POA: Diagnosis not present

## 2019-11-09 DIAGNOSIS — B070 Plantar wart: Secondary | ICD-10-CM | POA: Diagnosis not present

## 2019-11-09 DIAGNOSIS — D239 Other benign neoplasm of skin, unspecified: Secondary | ICD-10-CM | POA: Diagnosis not present

## 2019-11-09 DIAGNOSIS — L859 Epidermal thickening, unspecified: Secondary | ICD-10-CM | POA: Diagnosis not present

## 2019-11-09 DIAGNOSIS — M898X7 Other specified disorders of bone, ankle and foot: Secondary | ICD-10-CM | POA: Diagnosis not present

## 2019-11-09 MED ORDER — ONDANSETRON HCL 4 MG PO TABS: 4.0000 mg | ORAL_TABLET | Freq: Three times a day (TID) | ORAL | 0 refills | Status: DC | PRN

## 2019-11-09 MED ORDER — OXYCODONE-ACETAMINOPHEN 5-325 MG PO TABS: 1.0000 | ORAL_TABLET | ORAL | 0 refills | Status: DC | PRN

## 2019-11-09 MED ORDER — CEPHALEXIN 500 MG PO CAPS: 500.0000 mg | ORAL_CAPSULE | Freq: Two times a day (BID) | ORAL | 0 refills | Status: DC

## 2019-11-09 NOTE — Progress Notes (Signed)
Rx sent to pharmacy for outpatient surgery. °

## 2019-11-10 ENCOUNTER — Telehealth: Payer: Self-pay | Admitting: Podiatry

## 2019-11-10 NOTE — Telephone Encounter (Signed)
Pt called lvm to state there was a problem with medication reason unknown called pt back and lvm to call the office back 11/10/19

## 2019-11-10 NOTE — Telephone Encounter (Signed)
Received message pt states the pain medication is not working.

## 2019-11-10 NOTE — Telephone Encounter (Signed)
Left message to call with concerns.

## 2019-11-10 NOTE — Telephone Encounter (Signed)
I spoke with pt and she states she is having sharp pains. I told pt that she may need to loosen the outer dressing, the ace is put on to staunch any post op bleeding and hold out swelling and may be over doing its job. I instructed pt to sit down, remove the boot, open-ended sock and ace wrap only leave the gause dressing in place, then elevate the surgery foot for 15 minutes, but if pain worsens may dangle for 15 minutes this being the only time it was okay to dangle, after 15 minutes either way, place foot level with the hip, starting at the toes gently roll the ace wrap down the foot and up the leg replace the sock and boot. I told pt that if she could take ibuprofen take 800mg  3 times a day in between the doses of percocet. Pt states she has been taking the percocet 2 at a time. I told her not to do that, the doctor could opt not to refill until his original prescribed time. Pt states understanding.

## 2019-11-10 NOTE — Telephone Encounter (Signed)
Pt called and stated that her pain medication is not helping at all with her pain and she would like something stronger pain level at a 10 had taken 2 pils for pain instead of 1 to ease pain

## 2019-11-14 ENCOUNTER — Telehealth: Payer: Self-pay | Admitting: Internal Medicine

## 2019-11-14 ENCOUNTER — Encounter: Payer: Self-pay | Admitting: Podiatry

## 2019-11-14 NOTE — Telephone Encounter (Signed)
Pls contact pt [phone]

## 2019-11-14 NOTE — Telephone Encounter (Signed)
Contacted patient regarding thyroid scan and paperwork. Thank you.

## 2019-11-14 NOTE — Telephone Encounter (Signed)
Pt is asking housing authority to move her to a one level apt, she needs this because of foot surgery. She has 3 flights of stairs to go down and is afraid of falling. They will be sending paperwork. Pt also scheduled a f/u for 8/2 w/ dr Sherry Ruffing but she would like to have dr Sherry Ruffing to call her about her thyroid scan.

## 2019-11-15 ENCOUNTER — Ambulatory Visit: Payer: Medicare HMO | Admitting: Dietician

## 2019-11-18 ENCOUNTER — Ambulatory Visit (INDEPENDENT_AMBULATORY_CARE_PROVIDER_SITE_OTHER): Payer: Medicare HMO | Admitting: Podiatry

## 2019-11-18 ENCOUNTER — Other Ambulatory Visit: Payer: Self-pay

## 2019-11-18 ENCOUNTER — Ambulatory Visit (INDEPENDENT_AMBULATORY_CARE_PROVIDER_SITE_OTHER): Payer: Medicare HMO

## 2019-11-18 DIAGNOSIS — M898X7 Other specified disorders of bone, ankle and foot: Secondary | ICD-10-CM | POA: Diagnosis not present

## 2019-11-18 DIAGNOSIS — M2031 Hallux varus (acquired), right foot: Secondary | ICD-10-CM

## 2019-11-18 DIAGNOSIS — M79671 Pain in right foot: Secondary | ICD-10-CM

## 2019-11-18 DIAGNOSIS — Z9889 Other specified postprocedural states: Secondary | ICD-10-CM

## 2019-11-18 MED ORDER — OXYCODONE-ACETAMINOPHEN 10-325 MG PO TABS: 1.0000 | ORAL_TABLET | Freq: Four times a day (QID) | ORAL | 0 refills | Status: AC | PRN

## 2019-11-19 ENCOUNTER — Encounter: Payer: Self-pay | Admitting: Podiatry

## 2019-11-19 NOTE — Progress Notes (Signed)
Subjective:  Patient ID: Krista Sosa, female    DOB: 03/18/1951,  MRN: [id number]  Chief Complaint  Patient presents with  . Routine Post Op    POV #1 DOS 11/09/19 RT GREAT TOE EXCISION OF SKIN LESION W/ EXOSTECTOMY OF BONE/DR PRICE PT    68 y.o. female returns for post-op check.  Patient states that she is doing well.  She has been keeping well bandaged.  The sutures are intact.  Patient would like a refill on pain medication.  Her pain is controlled on Percocet.  She denies any other acute complaints.  Review of Systems: Negative except as noted in the HPI. Denies N/V/F/Ch.  Past Medical History:  Diagnosis Date  . Allergy   . Asthma, chronic 06/09/2006  . Bilateral cataracts 05/07/2016   s/p left cataract extraction 10/02/2016 and right cataract extraction 10/16/2016  . Bronchiectasis without complication (HCC) 05/13/2018   Mild left lower lobe bronchiectasis seen on CT scan of the thorax in December 2019  . Cataract    bil eyes  . Essential hypertension 02/12/2018  . Gastroesophageal reflux disease 08/13/2011  . Glaucoma    Per patient report  . Hx of adenomatous colonic polyps 11/16/2008  . Hypertension    pt said she is supposed to take med but she is not taking it.  . Hypertensive retinopathy, grade 2, bilateral 05/07/2016  . Latent tuberculosis    dx 03/17/08 - tx with INH X 9 months  . Lichenoid dermatitis 02/13/2012   Previously treated with Triamcinolone 0.025% cream Q8-12H PRN   . Migraine headache 08/06/2018  . Osteoarthritis, generalized 05/21/2006   Diffuse: cervical spine, right shoulder, left wrist Ineffective medications: Tylenol, ASA, Naprosyn, Ibuprofen, Meloxicam    . Overweight (BMI 25.0-29.9) 02/13/2012  . Perennial allergic rhinitis with seasonal variation 06/09/2006  . Persistent microalbuminuria associated with type II diabetes mellitus (HCC) 05/21/2006  . Sickle cell anemia (HCC)    pt has sickle cell trait per pt  . Sickle cell trait (HCC) 02/13/2012    . Tobacco abuse disorder 06/09/2006  . Tubular adenoma of rectum 11/16/2008   5 mm, excised endoscopically 11/16/2008, repeat colonoscopy due July 2015   . Ulnar neuropathy of right upper extremity 02/14/2016  . Urinary tract disease    JICP    Current Outpatient Medications:  .  albuterol (PROVENTIL HFA;VENTOLIN HFA) 108 (90 Base) MCG/ACT inhaler, Inhale 1-2 puffs into the lungs every 6 (six) hours as needed for wheezing or shortness of breath., Disp: 1 Inhaler, Rfl: 0 .  Blood Glucose Monitoring Suppl (TRUE METRIX AIR GLUCOSE METER) w/Device KIT, 1 Device by Does not apply route daily AND 1 strip daily. Use to test blood glucose 1-2 times daily. Dx: E11.29., Disp: 1 kit, Rfl: 0 .  cephALEXin (KEFLEX) 500 MG capsule, Take 1 capsule (500 mg total) by mouth 2 (two) times daily., Disp: 14 capsule, Rfl: 0 .  cetirizine (ZYRTEC) 10 MG tablet, TAKE 1 TABLET BY MOUTH EVERY DAY, Disp: 90 tablet, Rfl: 1 .  chlorpheniramine (CHLOR-TRIMETON) 4 MG tablet, Take 1 tablet (4 mg total) by mouth 2 (two) times daily as needed for allergies., Disp: 180 tablet, Rfl: 0 .  diclofenac sodium (VOLTAREN) 1 % GEL, Apply 4 g topically 4 (four) times daily., Disp: 150 g, Rfl: 3 .  empagliflozin (JARDIANCE) 25 MG TABS tablet, Take 25 mg by mouth daily., Disp: 90 tablet, Rfl: 2 .  Fluticasone-Salmeterol (ADVAIR) 250-50 MCG/DOSE AEPB, Inhale 1 puff into the lungs 2 (two) times daily.,   Disp: 60 each, Rfl: 11 .  glipiZIDE (GLUCOTROL XL) 2.5 MG 24 hr tablet, Take 1 tablet (2.5 mg total) by mouth daily with breakfast., Disp: 30 tablet, Rfl: 5 .  glucose blood test strip, Check blood glucose twice a day, Disp: 100 each, Rfl: 12 .  hydrochlorothiazide (HYDRODIURIL) 12.5 MG tablet, Take 1 tablet (12.5 mg total) by mouth daily., Disp: 30 tablet, Rfl: 5 .  ibuprofen (IBU) 800 MG tablet, Take 1 tablet (800 mg total) by mouth every 8 (eight) hours as needed for moderate pain., Disp: 270 tablet, Rfl: 3 .  losartan (COZAAR) 25 MG tablet,  Take 1 tablet (25 mg total) by mouth daily., Disp: 90 tablet, Rfl: 2 .  omeprazole (PRILOSEC) 40 MG capsule, Take 1 capsule (40 mg total) by mouth daily., Disp: 90 capsule, Rfl: 3 .  ondansetron (ZOFRAN) 4 MG tablet, Take 1 tablet (4 mg total) by mouth every 8 (eight) hours as needed for nausea or vomiting., Disp: 20 tablet, Rfl: 0 .  oxyCODONE-acetaminophen (PERCOCET) 5-325 MG tablet, Take 1 tablet by mouth every 4 (four) hours as needed for severe pain., Disp: 20 tablet, Rfl: 0 .  pseudoephedrine (SUDAFED) 30 MG tablet, Take 1 tablet (30 mg total) by mouth every 4 (four) hours as needed for congestion., Disp: 180 tablet, Rfl: 1 .  sertraline (ZOLOFT) 100 MG tablet, Take 2 tablets (200 mg total) by mouth daily., Disp: 180 tablet, Rfl: 3 .  simvastatin (ZOCOR) 20 MG tablet, Take 1 tablet (20 mg total) by mouth every evening., Disp: 90 tablet, Rfl: 3 .  sitaGLIPtin-metformin (JANUMET) 50-1000 MG tablet, Take 1 tablet by mouth 2 (two) times daily with a meal., Disp: 60 tablet, Rfl: 11 .  Skin Protectants, Misc. (EUCERIN) cream, Apply topically as needed for dry skin., Disp: 454 g, Rfl: 1 .  traZODone (DESYREL) 100 MG tablet, Take 1 tablet (100 mg total) by mouth at bedtime as needed for sleep., Disp: 90 tablet, Rfl: 3 .  triamcinolone (KENALOG) 0.025 % ointment, APPLY TOPICALLY TWICE DAILY AS DIRECTED, Disp: 30 g, Rfl: 3 .  triamcinolone (NASACORT ALLERGY 24HR) 55 MCG/ACT AERO nasal inhaler, Place 2 sprays into the nose daily as needed., Disp: 1 Inhaler, Rfl: 3 .  TRUEPLUS LANCETS 33G MISC, Use to test blood glucose 1-2 daily. E11.29, Disp: 100 each, Rfl: 3 .  Blood Glucose Monitoring Suppl DEVI, 1 each by Does not apply route daily for 1 day., Disp: 1 each, Rfl: 0 .  oxyCODONE-acetaminophen (PERCOCET) 10-325 MG tablet, Take 1 tablet by mouth every 6 (six) hours as needed for up to 8 days for pain., Disp: 30 tablet, Rfl: 0  Social History   Tobacco Use  Smoking Status Current Every Day Smoker  .  Packs/day: 0.50  . Years: 25.00  . Pack years: 12.50  . Types: Cigarettes  Smokeless Tobacco Never Used  Tobacco Comment   .5 ppd    No Known Allergies Objective:  There were no vitals filed for this visit. There is no height or weight on file to calculate BMI. Constitutional Well developed. Well nourished.  Vascular Foot warm and well perfused. Capillary refill normal to all digits.   Neurologic Normal speech. Oriented to person, place, and time. Epicritic sensation to light touch grossly present bilaterally.  Dermatologic Skin healing well without signs of infection. Skin edges well coapted without signs of infection.  Orthopedic: Tenderness to palpation noted about the surgical site.   Radiographs: 3 views of skeletally mature adult right foot: Good correction   and alignment noted.  Staples are intact.  Partial amputation of the distal phalanx noted.  No osteomyelitic changes noted. Assessment:   1. Foot pain, right   2. Post-operative state   3. Hallux hammertoe, right   4. Exostosis of toe    Plan:  Patient was evaluated and treated and all questions answered.  S/p foot surgery right -Progressing as expected post-operatively. -XR: See above -WB Status: Weightbearing as tolerated in surgical shoe -Sutures: Intact.  No clinical signs of dehiscence noted.  No clinical signs of infection noted. -Medications: Percocet -Foot redressed.  No follow-ups on file.

## 2019-11-22 ENCOUNTER — Telehealth: Payer: Self-pay | Admitting: *Deleted

## 2019-11-22 NOTE — Telephone Encounter (Signed)
Patient called in stating she lost all of her DM meds: Jardiance, glipizide and Janumet. Patient has refills at [facility]. She will contact them and if needed, will call back to [facility]. Hubbard Hartshorn, BSN, RN-BC

## 2019-11-23 ENCOUNTER — Ambulatory Visit: Payer: Medicare HMO | Admitting: Dietician

## 2019-11-25 ENCOUNTER — Ambulatory Visit (INDEPENDENT_AMBULATORY_CARE_PROVIDER_SITE_OTHER): Payer: Medicare HMO | Admitting: Podiatry

## 2019-11-25 ENCOUNTER — Other Ambulatory Visit: Payer: Self-pay

## 2019-11-25 DIAGNOSIS — Z9889 Other specified postprocedural states: Secondary | ICD-10-CM

## 2019-11-25 NOTE — Progress Notes (Signed)
  Subjective:  Patient ID: [name], female    DOB: 23-Dec-1950,  MRN: [id number]  No chief complaint on file.  DOS: 11/09/19 Procedure: Great Toe Excision of skin Lesion with Exostectomy right  69 y.o. female presents with the above complaint. History confirmed with patient.   Objective:  Physical Exam: no tenderness at the surgical site, local edema noted and calf supple, nontender. Incision: healing well, no significant drainage, no dehiscence  Assessment:   1. Post-operative state     Plan:  Patient was evaluated and treated and all questions answered.  Post-operative State -Staples removed - every other staples -WBAT in Surgical shoe  -Ok to wear sandals no closed toed shoes.   No follow-ups on file.

## 2019-11-30 ENCOUNTER — Encounter: Payer: Self-pay | Admitting: Podiatry

## 2019-12-01 ENCOUNTER — Telehealth: Payer: Self-pay | Admitting: Dietician

## 2019-12-01 ENCOUNTER — Other Ambulatory Visit: Payer: Self-pay | Admitting: Podiatry

## 2019-12-01 DIAGNOSIS — M898X7 Other specified disorders of bone, ankle and foot: Secondary | ICD-10-CM

## 2019-12-01 NOTE — Telephone Encounter (Signed)
Corrected start date on detailed written order for CGM and faxed to [facility]

## 2019-12-05 DIAGNOSIS — E1129 Type 2 diabetes mellitus with other diabetic kidney complication: Secondary | ICD-10-CM | POA: Diagnosis not present

## 2019-12-07 ENCOUNTER — Telehealth: Payer: Self-pay | Admitting: Dietician

## 2019-12-07 NOTE — Telephone Encounter (Signed)
Patient was approved for CGM. She agreed to bring CGM supplies and meet on 12/26/19 after she sees her doctor for training on how to use it.

## 2019-12-09 ENCOUNTER — Encounter: Payer: Self-pay | Admitting: Podiatry

## 2019-12-09 ENCOUNTER — Other Ambulatory Visit: Payer: Self-pay

## 2019-12-09 ENCOUNTER — Other Ambulatory Visit: Payer: Self-pay | Admitting: Sports Medicine

## 2019-12-09 ENCOUNTER — Ambulatory Visit (INDEPENDENT_AMBULATORY_CARE_PROVIDER_SITE_OTHER): Payer: Medicare HMO | Admitting: Podiatry

## 2019-12-09 DIAGNOSIS — Z9889 Other specified postprocedural states: Secondary | ICD-10-CM

## 2019-12-09 DIAGNOSIS — M2031 Hallux varus (acquired), right foot: Secondary | ICD-10-CM

## 2019-12-09 DIAGNOSIS — M79671 Pain in right foot: Secondary | ICD-10-CM

## 2019-12-09 MED ORDER — OXYCODONE-ACETAMINOPHEN 5-325 MG PO TABS: 1.0000 | ORAL_TABLET | ORAL | 0 refills | Status: DC | PRN

## 2019-12-09 NOTE — Progress Notes (Signed)
Changed percocet Rx to the correct pharmacy, [facility]. -Dr. Cannon Kettle

## 2019-12-09 NOTE — Progress Notes (Signed)
  Subjective:  Patient ID: Krista Sosa, female    DOB: 1950-11-27,  MRN: [id number]  Chief Complaint  Patient presents with  . Routine Post Op    POV#3 DOS 6.16.2021 RT GREAT TOE EXCISION OF SKIN LESION W/ EXOSTECTOMY OF BONE. Pt states her staples are painful.     69 y.o. female presents with the above complaint. History confirmed with patient.  She states the staples have been very painful.  She requests anesthesia to remove them today.  She also requests further pain medication  Objective:  Physical Exam: warm, good capillary refill, normal DP and PT pulses and normal sensory exam.  Right hallux surgical incision with mild superficial dehiscence, staples have pulled through but are still in the skin.  Mild fibrotic base.  No signs of infection Assessment:   1. Post-operative state   2. Foot pain, right   3. Hallux hammertoe, right      Plan:  Patient was evaluated and treated and all questions answered.  -All staples were removed following local anesthetic administration of 1.5 cc 0.5% Marcaine plain and 1.5 cc 2% Xylocaine plain tolerated well.  There are no signs of infection.  The wound was cleansed with Betadine solution and Steri-Strips were applied.  A light gauze dressing with compression with Coban was also applied. -Pain medication was refilled with 20 tablets of 5 mg Percocet Return in 2 weeks to see Dr. March Rummage  Return in about 2 weeks (around 12/23/2019) for followup with Dr March Rummage.

## 2019-12-12 ENCOUNTER — Telehealth: Payer: Self-pay | Admitting: Podiatry

## 2019-12-12 MED ORDER — OXYCODONE-ACETAMINOPHEN 5-325 MG PO TABS: 1.0000 | ORAL_TABLET | ORAL | 0 refills | Status: DC | PRN

## 2019-12-12 NOTE — Addendum Note (Signed)
Addended by: Hardie Pulley on: 12/12/2019 11:18 AM   Modules accepted: Orders

## 2019-12-12 NOTE — Telephone Encounter (Signed)
Pt called to request pain medication refill

## 2019-12-13 ENCOUNTER — Telehealth: Payer: Self-pay | Admitting: *Deleted

## 2019-12-13 NOTE — Telephone Encounter (Signed)
Pt is very upset, she states she needed to move and still does to a ground floor apt due to extensive foot surgery, she states that [employer], ms. Tate faxed paperwork to clinic appr 3 weeks ago and they have not rec'd it yet, she states that ms. Hall Busing told her if cone [facility] had done their job she would have been moved. Pt states her feet " have busted open again" and it is because somebody is lying. Nurse has called the ph# furnished by pt [phone] and left message for ms. Hall Busing to call triage asap. Reassured pt and explained all the possible reasons fax could have not reached [facility]. Advised her as soon as we speak w/ ms tate triage nurse will call her, she is agreeable.

## 2019-12-13 NOTE — Telephone Encounter (Signed)
Sounds good. Let me know how I can help.

## 2019-12-14 ENCOUNTER — Other Ambulatory Visit: Payer: Self-pay

## 2019-12-14 ENCOUNTER — Ambulatory Visit: Payer: Self-pay

## 2019-12-14 ENCOUNTER — Ambulatory Visit (INDEPENDENT_AMBULATORY_CARE_PROVIDER_SITE_OTHER): Payer: Medicare HMO | Admitting: Podiatry

## 2019-12-14 DIAGNOSIS — T8131XA Disruption of external operation (surgical) wound, not elsewhere classified, initial encounter: Secondary | ICD-10-CM

## 2019-12-14 DIAGNOSIS — Z9889 Other specified postprocedural states: Secondary | ICD-10-CM

## 2019-12-14 DIAGNOSIS — M79676 Pain in unspecified toe(s): Secondary | ICD-10-CM

## 2019-12-14 MED ORDER — SANTYL 250 UNIT/GM EX OINT
TOPICAL_OINTMENT | CUTANEOUS | 0 refills | Status: DC
Start: 2019-12-14 — End: 2020-06-20

## 2019-12-14 MED ORDER — SANTYL 250 UNIT/GM EX OINT: TOPICAL_OINTMENT | CUTANEOUS | 0 refills | Status: DC

## 2019-12-14 NOTE — Patient Instructions (Signed)
Monitor for any signs/symptoms of infection. Signs of an infection could be redness beyond the site of the incision/procedure/wound, foul smelling odor, drainage that is thick and yellow or green, or severe swelling and pain. Call the office immediately if any occur or go directly to the emergency room. Call with any questions/concerns.   Stop using peroxide on the wound.   Every day, clean the wound gently with saline, apply a thin layer (about the thickness of a nickel) to the wound bed and then cover with a saline moistened gauze and keep wrapped.

## 2019-12-14 NOTE — Chronic Care Management (AMB) (Signed)
  Care Management   Social Work Note  12/14/2019 Name: Krista Sosa MRN: [id number] DOB: 07-Jan-1951  Krista Sosa is a 69 y.o. year old female who sees Sherry Ruffing, Adela Lank, MD for primary care.    Following messages received from RN, Freddy Finner.   "Pt is very upset, she states she needed to move and still does to a ground floor apt due to extensive foot surgery, she states that [employer], ms. Tate faxed paperwork to clinic appr 3 weeks ago and they have not rec'd it yet, she states that ms. Hall Busing told her if cone [facility] had done their job she would have been moved. Pt states her feet " have busted open again" and it is because somebody is lying. Nurse has called the ph# furnished by pt [phone] and left message for ms. Hall Busing to call triage asap. Reassured pt and explained all the possible reasons fax could have not reached [facility]. Advised her as soon as we speak w/ ms tate triage nurse will call her, she is agreeable."  "Nurse has now called ms tate for the 2nd time and left a message. Spoke w/ pt and informed her that [facility] is waiting on a rtc from ms.tate I will ask Janeka Libman csw to assist"   BSW is not scheduling outreach at this time as two messages have already been left for Ms. Hall Busing with [employer] and patient has been informed that [facility] is awaiting return call.  If additional assistance is needed upon her return call from Ms.Hall Busing, please submit referral to CCM.      Ronn Melena, [place] Coordination Social Worker [place] [phone]

## 2019-12-14 NOTE — Telephone Encounter (Signed)
Nurse has now called Krista Sosa for the 2nd time and left a message. Spoke w/ pt and informed her that [facility] is waiting on a rtc from Krista.Sosa I will ask amber chrismon csw to assist

## 2019-12-14 NOTE — Progress Notes (Signed)
  Subjective:  Patient ID: Krista Sosa, female    DOB: 26-Jun-1950,  MRN: [id number]  Chief Complaint  Patient presents with  . Post-op Problem    DOS 11/09/2019 R hallux. Pt stated, "The incision is all the way open now. I've been putting peroxide on it, and it looks better. Taking oxycodone; pain has been 5-6/10".    69 y.o. female presents with the above complaint. History confirmed with patient.  She has been applying peroxide daily  Objective:  Physical Exam: warm, good capillary refill, normal DP and PT pulses and normal sensory exam.  Right hallux surgical incision with mild superficial dehiscence, fibrotic base with hyperkeratosis. No signs of infection  Assessment:   1. Post-operative state   2. Postoperative wound dehiscence, initial encounter      Plan:  Patient was evaluated and treated and all questions answered.  -Stop peroxide application -Rx for santyl sent to pharmacy, wound instructions sent -Dressing applied with betadine and DSD -Continue WBAT in surgical shoe  Return in about 1 week (around 12/21/2019).

## 2019-12-21 ENCOUNTER — Other Ambulatory Visit: Payer: Self-pay | Admitting: Podiatry

## 2019-12-23 ENCOUNTER — Other Ambulatory Visit: Payer: Self-pay

## 2019-12-23 ENCOUNTER — Ambulatory Visit (INDEPENDENT_AMBULATORY_CARE_PROVIDER_SITE_OTHER): Payer: Medicare HMO | Admitting: Podiatry

## 2019-12-23 VITALS — Temp 99.1°F

## 2019-12-23 DIAGNOSIS — T8131XA Disruption of external operation (surgical) wound, not elsewhere classified, initial encounter: Secondary | ICD-10-CM

## 2019-12-23 DIAGNOSIS — Z9889 Other specified postprocedural states: Secondary | ICD-10-CM

## 2019-12-23 NOTE — Progress Notes (Signed)
  Subjective:  Patient ID: Krista Sosa, female    DOB: 12-May-1951,  MRN: [id number]  Chief Complaint  Patient presents with  . Post-op Problem     POV #4 DOS 11/09/19 RT GREAT TOE EXCISION OF SKIN LESION W/ EXOSTECTOMY OF BONE. Pt stated, "It looks better to me. Still hurts. No fever or chills. I feel sick to my stomach sometimes, but it could be something that I'm eating. I've been lightheaded. I take pain medication. I'm using Santyl".   DOS: 11/09/19 Procedure: Great Toe Excision of skin Lesion with Exostectomy right  69 y.o. female presents with the above complaint. History confirmed with patient.   Objective:  Physical Exam: no tenderness at the surgical site, local edema noted and calf supple, nontender. Incision: 2x1 dehiscence with fibrogranular base, no warmth erythema signs of infection noted.  Assessment:   1. Post-operative state   2. Postoperative wound dehiscence, initial encounter     Plan:  Patient was evaluated and treated and all questions answered.  Post-operative State with wound dehiscence -Wound healing well per patient -Attempted to debride wound, patient refused. -Continue santyl -Dressed with silvadene, gauze, Coban today.   No follow-ups on file.

## 2019-12-26 ENCOUNTER — Ambulatory Visit: Payer: Medicare HMO | Admitting: Dietician

## 2019-12-26 ENCOUNTER — Encounter: Payer: Medicare HMO | Admitting: Internal Medicine

## 2020-01-03 ENCOUNTER — Encounter: Payer: Self-pay | Admitting: Podiatry

## 2020-01-03 ENCOUNTER — Other Ambulatory Visit: Payer: Self-pay

## 2020-01-03 ENCOUNTER — Ambulatory Visit (INDEPENDENT_AMBULATORY_CARE_PROVIDER_SITE_OTHER): Payer: Medicare HMO | Admitting: Podiatry

## 2020-01-03 DIAGNOSIS — M2031 Hallux varus (acquired), right foot: Secondary | ICD-10-CM

## 2020-01-03 DIAGNOSIS — M2041 Other hammer toe(s) (acquired), right foot: Secondary | ICD-10-CM

## 2020-01-03 DIAGNOSIS — M2042 Other hammer toe(s) (acquired), left foot: Secondary | ICD-10-CM

## 2020-01-03 DIAGNOSIS — E1151 Type 2 diabetes mellitus with diabetic peripheral angiopathy without gangrene: Secondary | ICD-10-CM

## 2020-01-03 NOTE — Progress Notes (Addendum)
  Subjective:  Patient ID: Krista Sosa, female    DOB: Sep 26, 1950,  MRN: [id number]  Chief Complaint  Patient presents with  . Routine Post Op    R hallux. Pt stated, "Doing well. No more pain, nausea, or dizziness. I don't need the pain medication. I transitioned into a regular shoe. I'm still using Santyl". No pus/fever/chills/N&V.   DOS: 11/09/19 Procedure: Great Toe Excision of skin Lesion with Exostectomy right  69 y.o. female presents with the above complaint. History confirmed with patient.    Objective:  Physical Exam: no tenderness at the surgical site, local edema noted and calf supple, nontender. Incision: healing dehiscence without warmth drainage signs of infeciton Faint PT pulses, no pedal hair. Digital contractures noted  Assessment:   1. Hallux hammertoe, right   2. Diabetes mellitus type 2 with peripheral artery disease (HCC)   3. Hammer toes of both feet    Plan:  Patient was evaluated and treated and all questions answered.  Post-operative State with wound dehiscence -Wound healing well per patient -Minimal debridement today. Pt to soak to promote skin turnover -Continue santyl -Dressed with silvadene, band-aid today  DM with PAD,Hammertoes -Would benefit from DM shoes.  No follow-ups on file.

## 2020-01-09 ENCOUNTER — Encounter: Payer: Self-pay | Admitting: Internal Medicine

## 2020-01-09 ENCOUNTER — Encounter: Payer: Self-pay | Admitting: Dietician

## 2020-01-09 ENCOUNTER — Ambulatory Visit (INDEPENDENT_AMBULATORY_CARE_PROVIDER_SITE_OTHER): Payer: Medicare HMO | Admitting: Internal Medicine

## 2020-01-09 ENCOUNTER — Other Ambulatory Visit: Payer: Self-pay

## 2020-01-09 ENCOUNTER — Ambulatory Visit (INDEPENDENT_AMBULATORY_CARE_PROVIDER_SITE_OTHER): Payer: Medicare HMO | Admitting: Dietician

## 2020-01-09 VITALS — BP 133/75 | HR 98 | Temp 98.3°F | Ht 67.0 in | Wt 162.6 lb

## 2020-01-09 DIAGNOSIS — E1169 Type 2 diabetes mellitus with other specified complication: Secondary | ICD-10-CM

## 2020-01-09 DIAGNOSIS — E1129 Type 2 diabetes mellitus with other diabetic kidney complication: Secondary | ICD-10-CM | POA: Diagnosis not present

## 2020-01-09 DIAGNOSIS — R809 Proteinuria, unspecified: Secondary | ICD-10-CM

## 2020-01-09 DIAGNOSIS — I10 Essential (primary) hypertension: Secondary | ICD-10-CM

## 2020-01-09 DIAGNOSIS — Z713 Dietary counseling and surveillance: Secondary | ICD-10-CM | POA: Diagnosis not present

## 2020-01-09 DIAGNOSIS — E119 Type 2 diabetes mellitus without complications: Secondary | ICD-10-CM | POA: Diagnosis not present

## 2020-01-09 LAB — POCT GLYCOSYLATED HEMOGLOBIN (HGB A1C): Hemoglobin A1C: 11.5 % — AB (ref 4.0–5.6)

## 2020-01-09 LAB — GLUCOSE, CAPILLARY: Glucose-Capillary: 336 mg/dL — ABNORMAL HIGH (ref 70–99)

## 2020-01-09 MED ORDER — PEN NEEDLES 32G X 4 MM MISC: 1.0000 | Freq: Four times a day (QID) | 1 refills | Status: DC

## 2020-01-09 MED ORDER — LANTUS SOLOSTAR 100 UNIT/ML ~~LOC~~ SOPN: 15.0000 [IU] | PEN_INJECTOR | Freq: Every day | SUBCUTANEOUS | 11 refills | Status: DC

## 2020-01-09 NOTE — Patient Instructions (Addendum)
Krista Sosa,   It was a pleasure seeing you today! Thank you for your visit.   Please scan your sensor to check your blood sugar  1- every moring when you wake, 2- before breakfast 3- Before lunch 4- Before dinner  5- before bed  6-And  any other time to see how food, activity and medicine are affecting your body    Start taking 15 units Lantus insulin when you get it . cll me for questions or concerns.  Please make a follow up in 2 weeks.   Butch Penny [phone]

## 2020-01-09 NOTE — Progress Notes (Signed)
Diabetes Self-Management Education  Visit Type: First/Initial  Appt. Start Time: 1400 Appt. End Time: 1500  01/09/2020  [name], identified by name and date of birth, is a 69 y.o. female with a diagnosis of Diabetes: Type 2.   ASSESSMENT  Last menstrual period 08/27/2007. There is no height or weight on file to calculate BMI.   Diabetes Self-Management Education - 01/09/20 1700      Visit Information   Visit Type First/Initial      Initial Visit   Diabetes Type Type 2    Are you currently following a meal plan? Yes    What type of meal plan do you follow? balanced meals    Are you taking your medications as prescribed? Yes      Health Coping   How would you rate your overall health? Good      Psychosocial Assessment   Patient Belief/Attitude about Diabetes Motivated to manage diabetes    Self-care barriers Lack of material resources    Self-management support Doctor's office;Family;CDE visits    Patient Concerns Medication;Monitoring    Special Needs None    Preferred Learning Style Auditory;Visual;Hands on    Learning Readiness Ready    How often do you need to have someone help you when you read instructions, pamphlets, or other written materials from your doctor or pharmacy? 2 - Rarely      Pre-Education Assessment   Patient understands using medications safely. Needs Instruction    Patient understands monitoring blood glucose, interpreting and using results Needs Instruction    Patient understands prevention, detection, and treatment of acute complications. Needs Instruction      Complications   Last HgB A1C per patient/outside source 11.5 %    How often do you check your blood sugar? 1-2 times/day    Fasting Blood glucose range (mg/dL) 180-200;>200    Number of hypoglycemic episodes per month --   0   Number of hyperglycemic episodes per week 10    Have you had a dilated eye exam in the past 12 months? Yes    Are you checking your feet? Yes    How  many days per week are you checking your feet? 7      Patient Education   Previous Diabetes Education Yes (please comment)    Medications Taught/reviewed insulin injection, site rotation, insulin storage and needle disposal.    Monitoring Taught/evaluated SMBG meter.   taught CGM personal . patient placed her sensor & started it     Individualized Goals (developed by patient)   Medications take my medication as prescribed    Monitoring  test my blood glucose as discussed      Outcomes   Expected Outcomes Demonstrated interest in learning. Expect positive outcomes    Future DMSE 2 wks    Program Status Not Completed           Individualized Plan for Diabetes Self-Management Training:   Learning Objective:  Patient will have a greater understanding of diabetes self-management. Patient education plan is to attend individual and/or group sessions per assessed needs and concerns.   Plan:   There are no Patient Instructions on file for this visit.  Expected Outcomes:  Demonstrated interest in learning. Expect positive outcomes  Education material provided: Diabetes Resources  If problems or questions, patient to contact team via:  Phone  Future DSME appointment: 2 wks  [name], RD 01/09/2020 5:14 PM.

## 2020-01-09 NOTE — Progress Notes (Signed)
CC: HTN and diabetes  HPI:  [name] is a 69 y.o. with history listed below presenting for follow-up of her hypertension and diabetes.  Past Medical History:  Diagnosis Date  . Allergy   . Asthma, chronic 06/09/2006  . Bilateral cataracts 05/07/2016   s/p left cataract extraction 10/02/2016 and right cataract extraction 10/16/2016  . Bronchiectasis without complication ([place]) 16/02/9603   Mild left lower lobe bronchiectasis seen on CT scan of the thorax in December 2019  . Cataract    bil eyes  . Essential hypertension 02/12/2018  . Gastroesophageal reflux disease 08/13/2011  . Glaucoma    Per patient report  . Hx of adenomatous colonic polyps 11/16/2008  . Hypertension    pt said she is supposed to take med but she is not taking it.  . Hypertensive retinopathy, grade 2, bilateral 05/07/2016  . Latent tuberculosis    dx 03/17/08 - tx with INH X 9 months  . Lichenoid dermatitis 02/13/2012   Previously treated with Triamcinolone 0.025% cream Q8-12H PRN   . Migraine headache 08/06/2018  . Osteoarthritis, generalized 05/21/2006   Diffuse: cervical spine, right shoulder, left wrist Ineffective medications: Tylenol, ASA, Naprosyn, Ibuprofen, Meloxicam    . Overweight (BMI 25.0-29.9) 02/13/2012  . Perennial allergic rhinitis with seasonal variation 06/09/2006  . Persistent microalbuminuria associated with type II diabetes mellitus ([place]) 05/21/2006  . Sickle cell anemia (HCC)    pt has sickle cell trait per pt  . Sickle cell trait ([place]) 02/13/2012  . Tobacco abuse disorder 06/09/2006  . Tubular adenoma of rectum 11/16/2008   5 mm, excised endoscopically 11/16/2008, repeat colonoscopy due July 2015   . Ulnar neuropathy of right upper extremity 02/14/2016  . Urinary tract disease    JICP   Review of Systems:   Constitutional: Negative for chills and fever.  Respiratory: Negative for shortness of breath.   Cardiovascular: Negative for chest pain and leg swelling.   Gastrointestinal: Negative for abdominal pain, nausea and vomiting.  Genitourinary: Positive for polyuria and polydipsia. Neurological: Negative for dizziness and headaches.   Physical Exam:  Vitals:   01/09/20 1513 01/09/20 1520  BP: (!) 152/77 133/75  Pulse: (!) 105 98  Temp: 98.3 F (36.8 C)   TempSrc: Oral   SpO2: 100%   Weight: 162 lb 9.6 oz (73.8 kg)   Height: 5\' 7"  (1.702 m)    Physical Exam Constitutional:      Appearance: Normal appearance.  HENT:     Head: Normocephalic and atraumatic.  Cardiovascular:     Rate and Rhythm: Normal rate and regular rhythm.     Pulses: Normal pulses.     Heart sounds: Normal heart sounds.  Pulmonary:     Effort: Pulmonary effort is normal.     Breath sounds: Normal breath sounds.  Abdominal:     General: Abdomen is flat. Bowel sounds are normal.     Palpations: Abdomen is soft.  Skin:    General: Skin is warm and dry.     Capillary Refill: Capillary refill takes less than 2 seconds.  Neurological:     General: No focal deficit present.     Mental Status: She is alert and oriented to person, place, and time.  Psychiatric:        Mood and Affect: Mood normal.        Behavior: Behavior normal.    Assessment & Plan:   See Encounters Tab for problem based charting.  Patient discussed with Dr. Philipp Ovens

## 2020-01-09 NOTE — Patient Instructions (Signed)
Ms. DAVI ROTAN,  It was a pleasure to see you today. Thank you for coming in.   Today we discussed your diabetes. Your A1c was elevated today at 11.5. This is very high.  Please start taking the Lantus 15 units daily.  Please start using the CGM to monitor your blood sugars.  Continue taking the Jardiance and the Janument. You can stop taking the glipizide.    We also discussed your blood pressure. Please continue taking your current medications.   Please return to clinic in 2 weeks or sooner if needed.   Thank you again for coming in.   [name].

## 2020-01-10 ENCOUNTER — Telehealth: Payer: Self-pay | Admitting: Dietician

## 2020-01-10 NOTE — Progress Notes (Signed)
Internal Medicine Clinic Attending ° °Case discussed with Dr. Krienke  At the time of the visit.  We reviewed the resident’s history and exam and pertinent patient test results.  I agree with the assessment, diagnosis, and plan of care documented in the resident’s note.  °

## 2020-01-10 NOTE — Assessment & Plan Note (Signed)
Patient is currently on Jardiance 25 mg daily, Janumet 50-1000 mg twice daily, and glipizide 2.5 mg daily.  She reports that she has been having some polyuria, polydipsia, and her appetite varies from low appetite and normal appetite's.  She has the CGM with her, has not been able to set it up yet.  She does not check her blood sugars at home because she does not like needles. Her A1c today is significantly elevated at 11.5, up from 10 on her last visit. We had a long discussion about her uncontrolled diabetes, discussed that she is already on max dose of 2 of the diabetic medications, and that increasing glipizide likely would not bring her A1c down to the appropriate range.  We discussed the risk of uncontrolled diabetes including but not limited to vision loss, kidney damage, cardiac issues, neuropathy, poor wound healing, and risk of stroke.  Discussed that her A1c is significantly elevated and that we would strongly recommend starting insulin.  After discussion patient appeared to be open to starting a 1 day insulin dose, she reported that she did not want to take all the oral medications if she was starting insulin.  We decided to stop the glipizide for now and start her on a long-acting once a day insulin.  -Continue Jardiance 25 mg daily  -Continue Janumet 50-1000 mg twice daily  -Discontinue glipizide -Start Lantus 15 units daily -Donna, diabetes educator, provided information on the CGM and insulin ejection -RTC in 2 weeks to evaluate CGM

## 2020-01-10 NOTE — Telephone Encounter (Signed)
Called patient who could not talk long because she was at work. She said the CGM is working fine. She checked her blood glucose one time an it was 246. I encouraged her to check at least before all meal and bedtime and upon waking.  She verbalized understanding. She asked to schedule her 2 week follow up later.

## 2020-01-10 NOTE — Assessment & Plan Note (Signed)
Patient is currently on HCTZ 12.5 mg daily and losartan 25 mg daily.  Blood pressure today was initially elevated at 152/77, repeat was 133/75.  She denies any issues taking her medications.  Blood pressure today is well controlled.  No changes at this time.

## 2020-01-11 ENCOUNTER — Telehealth (HOSPITAL_COMMUNITY): Payer: Self-pay

## 2020-01-11 ENCOUNTER — Encounter (HOSPITAL_COMMUNITY): Payer: Self-pay

## 2020-01-11 ENCOUNTER — Other Ambulatory Visit: Payer: Self-pay

## 2020-01-11 ENCOUNTER — Ambulatory Visit (HOSPITAL_COMMUNITY)
Admission: EM | Admit: 2020-01-11 | Discharge: 2020-01-11 | Disposition: A | Discharge status: Home / Self Care | Payer: Medicare HMO | Attending: Family Medicine | Admitting: Family Medicine

## 2020-01-11 DIAGNOSIS — S39012A Strain of muscle, fascia and tendon of lower back, initial encounter: Secondary | ICD-10-CM

## 2020-01-11 MED ORDER — CYCLOBENZAPRINE HCL 10 MG PO TABS
10.0000 mg | ORAL_TABLET | Freq: Two times a day (BID) | ORAL | 0 refills | Status: DC | PRN
Start: 2020-01-11 — End: 2020-01-11

## 2020-01-11 MED ORDER — CYCLOBENZAPRINE HCL 10 MG PO TABS
10.0000 mg | ORAL_TABLET | Freq: Two times a day (BID) | ORAL | 0 refills | Status: DC | PRN
Start: 2020-01-11 — End: 2020-06-20
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
## 2020-01-11 NOTE — Discharge Instructions (Signed)
Activities as tolerated. 
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
## 2020-01-13 ENCOUNTER — Telehealth: Payer: Self-pay | Admitting: Dietician

## 2020-01-13 NOTE — Telephone Encounter (Signed)
Left voicemail for return call  

## 2020-01-20 ENCOUNTER — Ambulatory Visit: Payer: Medicare HMO | Admitting: Orthotics

## 2020-01-20 ENCOUNTER — Other Ambulatory Visit: Payer: Self-pay

## 2020-01-23 NOTE — Telephone Encounter (Signed)
I called to follow up on new to insulin and new to CGM: Per Krista Sosa she is "Doing good with  blood sugars 130-150-75." She reports that she has had alarms for too low and too high. She started insulin 15 units last week. Her blood sugars were 200-300s before she started it. The lowest has been 59 and this happened yesterday afternoon.  She felt "kinda funny" when it alarmed low. Takes orange juice when it alarms. Has been taking her glipizide, she was not aware she was supposed to stop it. advised her to stop it per Dr. Dorothyann Peng note. She plans to change her sensor with her niece's help tomorrow.  She scheduled a follow up for Diabetes Self Management Training 02/03/20 at 1:15 PM and understands she can call before then for support.

## 2020-02-01 ENCOUNTER — Telehealth: Payer: Self-pay | Admitting: *Deleted

## 2020-02-01 NOTE — Telephone Encounter (Signed)
Received faxed forms for Diabetic shoes from [facility]. Patient was last seen for diabetes management on 01/09/2020 by PCP. Forms placed in [employer]'s box but must be signed by PCP. Hubbard Hartshorn, BSN, RN-BC

## 2020-02-02 ENCOUNTER — Other Ambulatory Visit: Payer: Self-pay | Admitting: Student

## 2020-02-02 MED ORDER — ATORVASTATIN CALCIUM 40 MG PO TABS
40.0000 mg | ORAL_TABLET | Freq: Every day | ORAL | 11 refills | Status: DC
Start: 2020-02-02 — End: 2020-11-07

## 2020-02-03 ENCOUNTER — Encounter: Payer: Self-pay | Admitting: Dietician

## 2020-02-03 ENCOUNTER — Ambulatory Visit (INDEPENDENT_AMBULATORY_CARE_PROVIDER_SITE_OTHER): Payer: Medicare HMO | Admitting: Dietician

## 2020-02-03 ENCOUNTER — Encounter: Payer: Self-pay | Admitting: Podiatry

## 2020-02-03 ENCOUNTER — Other Ambulatory Visit: Payer: Self-pay

## 2020-02-03 ENCOUNTER — Ambulatory Visit (INDEPENDENT_AMBULATORY_CARE_PROVIDER_SITE_OTHER): Payer: Medicare HMO | Admitting: Podiatry

## 2020-02-03 DIAGNOSIS — M2042 Other hammer toe(s) (acquired), left foot: Secondary | ICD-10-CM

## 2020-02-03 DIAGNOSIS — M2041 Other hammer toe(s) (acquired), right foot: Secondary | ICD-10-CM

## 2020-02-03 DIAGNOSIS — E1151 Type 2 diabetes mellitus with diabetic peripheral angiopathy without gangrene: Secondary | ICD-10-CM

## 2020-02-03 DIAGNOSIS — M2031 Hallux varus (acquired), right foot: Secondary | ICD-10-CM

## 2020-02-03 DIAGNOSIS — E1129 Type 2 diabetes mellitus with other diabetic kidney complication: Secondary | ICD-10-CM | POA: Diagnosis not present

## 2020-02-03 NOTE — Patient Instructions (Addendum)
Carlette,   Thank you for your visit today.   You are doing a wonderful job taking your insulin and other diabetes medicines.  You are doing great at learning how to change and operate the Continuous glucose monitor and are scanning 6 times a day- good job- keep doing that!   Your blood sugar are a little bit higher than goal- you can discussed this when you meet with the doctor.   We can follow up in 4-6 weeks instead of 2 months or sooner if desired when you see the doctor.   Call anytime!   Butch Penny [phone]

## 2020-02-03 NOTE — Progress Notes (Signed)
Diabetes Self-Management Education  Visit Type: Follow-up  Appt. Start Time: 1345 Appt. End Time: 5621  02/03/2020  [name], identified by name and date of birth, is a 69 y.o. female with a diagnosis of Diabetes:  Type 2  ASSESSMENT  CGM Results from download:   % Time CGM active:   81 %   (Goal >70%)  Average glucose:   175 mg/dL for 14 days  Glucose management indicator:   7.5 %  Time in range (70-180 mg/dL):   50 %   (Goal >70%)  Time High (181-250 mg/dL):   44 %   (Goal < 25%)  Time Very High (>250 mg/dL):    5 %   (Goal < 5%)  Time Low (54-69 mg/dL):   1 %   (Goal <4%)  Time Very Low (<54 mg/dL):   0 %   (Goal <1%)  Coefficient of variation:   29.4 %   (Goal <36%)   Her blood CGM download show improved blood sugars, but mostly above target. a pattern of dipping blood sugars ~ 3 am and 6 PM with more variability around these times likely coming from variable food intake and activity. She had 3 low blood sugar events prior to stopping her glipizide.  Her blood sugar have been higher since stopping the glipizide, but she has not had any more low blood sugars.   She states she is not a big eater and is not ready to change her food intake. She eats about 8-10 am, 6-8 PM with snacks 10PM -11am and sometimes another snack 1230 1 am. Acknowledged the other changes she has made recently with taking insulin and using a Continuous glucose monitor.   Weight 163 lb 4.8 oz (74.1 kg), last menstrual period 08/27/2007. Body mass index is 25.58 kg/m.   Diabetes Self-Management Education - 02/03/20 1400      Visit Information   Visit Type Follow-up      Pre-Education Assessment   Patient understands monitoring blood glucose, interpreting and using results Needs Review    Patient understands prevention, detection, and treatment of acute complications. Demonstrates understanding / competency      Exercise   Exercise Type ADL's;Light (walking / raking leaves)    How many days  per week to you exercise? 7    How many minutes per day do you exercise? 60    Total minutes per week of exercise 420      Patient Education   Previous Diabetes Education Yes (please comment)   here   Monitoring Taught/discussed recording of test results and interpretation of SMBG.;Interpreting lab values - A1C, lipid, urine microalbumina.    Acute complications Discussed and identified patients' treatment of hyperglycemia.      Patient Self-Evaluation of Goals - Patient rates self as meeting previously set goals (% of time)   Medications >75%    Monitoring >75%      Outcomes   Expected Outcomes Demonstrated interest in learning. Expect positive outcomes    Future DMSE 4-6 wks    Program Status Not Completed      Subsequent Visit   Since your last visit have you continued or begun to take your medications as prescribed? Yes    Since your last visit have you experienced any weight changes? No change    Since your last visit, are you checking your blood glucose at least once a day? Yes   average 6 times a day  Individualized Plan for Diabetes Self-Management Training:   Learning Objective:  Patient will have a greater understanding of diabetes self-management. Patient education plan is to attend individual and/or group sessions per assessed needs and concerns.   Plan:   Patient Instructions  Krista Sosa,   Thank you for your visit today.   You are doing a wonderful job taking your insulin and other diabetes medicines.  You are doing great at learning how to change and operate the Continuous glucose monitor and are scanning 6 times a day- good job- keep doing that!   Your blood sugar are a little bit higher than goal- you can discussed this when you meet with the doctor.   We can follow up in 4-6 weeks instead of 2 months or sooner if desired when you see the doctor.   Call anytime!   Krista Sosa [phone]      Expected Outcomes:  Demonstrated interest in  learning. Expect positive outcomes  Education material provided: Diabetes Resources  If problems or questions, patient to contact team via:  Phone  Future DSME appointment: 4-6 wks  [name], RD 02/03/2020 2:56 PM.

## 2020-02-07 NOTE — Telephone Encounter (Signed)
Signed forms for diabetic shoes faxed to [employer] at [phone]. Fax confirmation receipt received. Hubbard Hartshorn, BSN, RN-BC

## 2020-02-21 ENCOUNTER — Other Ambulatory Visit: Payer: Self-pay | Admitting: Dietician

## 2020-02-21 DIAGNOSIS — E1129 Type 2 diabetes mellitus with other diabetic kidney complication: Secondary | ICD-10-CM

## 2020-02-21 MED ORDER — PEN NEEDLES 32G X 4 MM MISC: 1.0000 | Freq: Four times a day (QID) | 1 refills | Status: DC

## 2020-02-21 NOTE — Telephone Encounter (Addendum)
Has recently moved and lost her pen needles to take her lantus, requests prescription for some sent to [facility]. I told her that if they cannot fill them to call us back

## 2020-02-23 NOTE — Progress Notes (Signed)
  Subjective:  Patient ID: Krista Sosa, female    DOB: [birth year],  MRN: [id number]  Chief Complaint  Patient presents with  . Routine Post Op    right great toe is doing good, "denies any pain"   DOS: 11/09/19 Procedure: Great Toe Excision of skin Lesion with Exostectomy right  69 y.o. female presents with the above complaint. History confirmed with patient.    Objective:  Physical Exam: no tenderness at the surgical site, local edema noted and calf supple, nontender. Incision: healing dehiscence without warmth drainage signs of infeciton Faint PT pulses, no pedal hair. Digital contractures noted  Assessment:   1. Hallux hammertoe, right   2. Diabetes mellitus type 2 with peripheral artery disease (HCC)   3. Hammer toes of both feet    Plan:  Patient was evaluated and treated and all questions answered.  Post-operative State with wound dehiscence -Wound continues to improve.  No debridement today per patient request -Patient to continue applying Santyl until wound fully healed. -Advised patient come back in 1 month for recheck patient elected to follow-up only as needed.  No follow-ups on file.

## 2020-02-27 ENCOUNTER — Other Ambulatory Visit: Payer: Self-pay | Admitting: Nurse Practitioner

## 2020-02-27 DIAGNOSIS — Z0000 Encounter for general adult medical examination without abnormal findings: Secondary | ICD-10-CM

## 2020-03-06 DIAGNOSIS — E1129 Type 2 diabetes mellitus with other diabetic kidney complication: Secondary | ICD-10-CM | POA: Diagnosis not present

## 2020-03-16 ENCOUNTER — Ambulatory Visit: Payer: Medicare HMO | Admitting: Orthotics

## 2020-03-26 ENCOUNTER — Ambulatory Visit (INDEPENDENT_AMBULATORY_CARE_PROVIDER_SITE_OTHER): Payer: Medicare HMO | Admitting: Internal Medicine

## 2020-03-26 ENCOUNTER — Ambulatory Visit: Payer: Medicare HMO | Admitting: Orthotics

## 2020-03-26 ENCOUNTER — Encounter: Payer: Self-pay | Admitting: Internal Medicine

## 2020-03-26 ENCOUNTER — Other Ambulatory Visit: Payer: Self-pay

## 2020-03-26 VITALS — BP 155/66 | HR 74 | Temp 98.1°F | Ht 66.0 in | Wt 170.1 lb

## 2020-03-26 DIAGNOSIS — L509 Urticaria, unspecified: Secondary | ICD-10-CM | POA: Insufficient documentation

## 2020-03-26 DIAGNOSIS — E1129 Type 2 diabetes mellitus with other diabetic kidney complication: Secondary | ICD-10-CM | POA: Diagnosis not present

## 2020-03-26 DIAGNOSIS — R809 Proteinuria, unspecified: Secondary | ICD-10-CM | POA: Diagnosis not present

## 2020-03-26 DIAGNOSIS — M2031 Hallux varus (acquired), right foot: Secondary | ICD-10-CM

## 2020-03-26 DIAGNOSIS — M2042 Other hammer toe(s) (acquired), left foot: Secondary | ICD-10-CM

## 2020-03-26 DIAGNOSIS — J4520 Mild intermittent asthma, uncomplicated: Secondary | ICD-10-CM

## 2020-03-26 DIAGNOSIS — I10 Essential (primary) hypertension: Secondary | ICD-10-CM

## 2020-03-26 DIAGNOSIS — Z23 Encounter for immunization: Secondary | ICD-10-CM | POA: Diagnosis not present

## 2020-03-26 DIAGNOSIS — M2041 Other hammer toe(s) (acquired), right foot: Secondary | ICD-10-CM

## 2020-03-26 DIAGNOSIS — J4521 Mild intermittent asthma with (acute) exacerbation: Secondary | ICD-10-CM

## 2020-03-26 DIAGNOSIS — E1151 Type 2 diabetes mellitus with diabetic peripheral angiopathy without gangrene: Secondary | ICD-10-CM

## 2020-03-26 MED ORDER — LOSARTAN POTASSIUM 25 MG PO TABS: 25.0000 mg | ORAL_TABLET | Freq: Every day | ORAL | 2 refills | Status: DC

## 2020-03-26 MED ORDER — FLUTICASONE-SALMETEROL 250-50 MCG/DOSE IN AEPB: 1.0000 | INHALATION_SPRAY | Freq: Two times a day (BID) | RESPIRATORY_TRACT | 11 refills | Status: DC

## 2020-03-26 MED ORDER — INSULIN DEGLUDEC 100 UNIT/ML ~~LOC~~ SOPN: 20.0000 [IU] | PEN_INJECTOR | Freq: Every day | SUBCUTANEOUS | 1 refills | Status: DC

## 2020-03-26 NOTE — Assessment & Plan Note (Signed)
Patient is currently on HCTZ 12.5 mg daily losartan 25 mg daily.  She denies any issues taking her medications.  Blood pressure today mildly elevated 157/71, repeat was 155/66.  We are currently adjusting her Lantus due to her concerns about it causing her rash.  We will hold off on adjusting her medication to limit confusion as to cause of her rash.  -Continue HCTZ 12 mg daily -Continue losartan 25 mg daily, continue switching to olmesartan if this pressure still elevated next visit

## 2020-03-26 NOTE — Assessment & Plan Note (Signed)
Patient is currently on Jardiance 25 mg daily, Janumet 50-1000 mg twice daily, and Lantus 15 units daily.  She states that she misplaced her medications for approximately 1 month due to the move however has been on it for the past month.  Her CGM over the past 14 days showed an average glucose of 187.  Greater than 250 approximately 12% of the time, 181-250 43% of the time, 70-180 44% of the time, and 54-69 1% of the time.  She reports that she had some lightheadedness when her CBG was around 74.  Denies any polyuria, polydipsia, nausea, vomiting, headaches, lightheadedness, dizziness, or other symptoms.  She is concerned about hives related to her Lantus injections.  Discussed adjusting her long-acting insulin to [place] she is agreeable with this.  -Continue Jardiance 25 mg daily -Continue Janumet 50-1000 mg twice daily -Switch Lantus to [place] 20 units daily -RTC in 1 month to monitor blood sugars

## 2020-03-26 NOTE — Progress Notes (Signed)
shoulds should be reordered to size 8

## 2020-03-26 NOTE — Assessment & Plan Note (Signed)
She reports that ever since starting her Lantus she has been having episodes of breaking out in hives, states that it will occur approximately 30 minutes following her injection.  States that it is on her arms, legs, back, and feet, initially occurred every time she injected however recently has been decreasing to only 4-5 times per week.  She denies any shortness of breath, lip swelling, tongue swelling, diarrhea, headaches, or other symptoms.  She denies any other known precipitating factors, denies any changes in her laundry detergent, changes in medication, new diet, new animal exposure.  She reports that she recently moved however states that the episodes of hives occurred prior to the move.  She has been taking Benadryl which has been helping.  She does not currently have any rash at this time, no itchiness or redness noted.  Given the timing this could be related to her Lantus injections, will switch her from Lantus to [place].  -Continue benadryl as needed -Advised to continue to monitor for potential causes of her rash

## 2020-03-26 NOTE — Patient Instructions (Signed)
Krista Sosa,  It was a pleasure to see you today. Thank you for coming in.   Today we discussed your diabetes. In regards to this please switch from the Lantus to [place] 20 units daily. Please continue checking your blood sugars and bringing in your meter on your next visit.    We also discussed your blood pressure. Please continue taking your medications, we will hold off on adjusting your medications at this time.   Please return to clinic in 1 month or sooner if needed.   Thank you again for coming in.   [name].

## 2020-03-26 NOTE — Progress Notes (Signed)
CC: Diabetes, hypertension, and hives  HPI:  [name] is a 69 y.o. with history listed below including asthma, hypertension, diabetes, and tobacco use presenting for follow-up of her diabetes, hypertension, and development of hives.  Past Medical History:  Diagnosis Date  . Allergy   . Asthma, chronic 06/09/2006  . Bilateral cataracts 05/07/2016   s/p left cataract extraction 10/02/2016 and right cataract extraction 10/16/2016  . Bronchiectasis without complication ([place]) 56/21/3086   Mild left lower lobe bronchiectasis seen on CT scan of the thorax in December 2019  . Cataract    bil eyes  . Essential hypertension 02/12/2018  . Gastroesophageal reflux disease 08/13/2011  . Glaucoma    Per patient report  . Hx of adenomatous colonic polyps 11/16/2008  . Hypertension    pt said she is supposed to take med but she is not taking it.  . Hypertensive retinopathy, grade 2, bilateral 05/07/2016  . Latent tuberculosis    dx 03/17/08 - tx with INH X 9 months  . Lichenoid dermatitis 02/13/2012   Previously treated with Triamcinolone 0.025% cream Q8-12H PRN   . Migraine headache 08/06/2018  . Osteoarthritis, generalized 05/21/2006   Diffuse: cervical spine, right shoulder, left wrist Ineffective medications: Tylenol, ASA, Naprosyn, Ibuprofen, Meloxicam    . Overweight (BMI 25.0-29.9) 02/13/2012  . Perennial allergic rhinitis with seasonal variation 06/09/2006  . Persistent microalbuminuria associated with type II diabetes mellitus ([place]) 05/21/2006  . Sickle cell anemia (HCC)    pt has sickle cell trait per pt  . Sickle cell trait ([place]) 02/13/2012  . Tobacco abuse disorder 06/09/2006  . Tubular adenoma of rectum 11/16/2008   5 mm, excised endoscopically 11/16/2008, repeat colonoscopy due July 2015   . Ulnar neuropathy of right upper extremity 02/14/2016  . Urinary tract disease    JICP   Review of Systems:   Constitutional: Negative for chills and fever.  Respiratory: Negative for  shortness of breath.   Cardiovascular: Negative for chest pain and leg swelling.  Gastrointestinal: Negative for abdominal pain, nausea and vomiting. Dermatological: Positive for hives, negative for lip swelling or tongue swelling. Neurological: Positive for lightheadedness.  Negative for headaches.   Physical Exam:  Vitals:   03/26/20 1322 03/26/20 1410  BP: (!) 157/71 (!) 155/66  Pulse: 79 74  Temp: 98.1 F (36.7 C)   TempSrc: Oral   SpO2: 98%   Weight: 170 lb 1.6 oz (77.2 kg)   Height: 5\' 6"  (1.676 m)    Physical Exam Constitutional:      Appearance: Normal appearance.  HENT:     Head: Normocephalic and atraumatic.  Cardiovascular:     Rate and Rhythm: Normal rate and regular rhythm.     Pulses: Normal pulses.     Heart sounds: Normal heart sounds.  Pulmonary:     Effort: Pulmonary effort is normal.     Breath sounds: Normal breath sounds.  Abdominal:     General: Abdomen is flat. Bowel sounds are normal.     Palpations: Abdomen is soft.  Musculoskeletal:        General: Normal range of motion.     Cervical back: Normal range of motion and neck supple.  Skin:    General: Skin is warm and dry.     Capillary Refill: Capillary refill takes less than 2 seconds.     Findings: No bruising or rash.  Neurological:     General: No focal deficit present.     Mental Status: She is alert  and oriented to person, place, and time.  Psychiatric:        Mood and Affect: Mood normal.        Behavior: Behavior normal.    Assessment & Plan:   See Encounters Tab for problem based charting.  Patient discussed with Dr. Heber [place]

## 2020-03-28 DIAGNOSIS — Z961 Presence of intraocular lens: Secondary | ICD-10-CM | POA: Diagnosis not present

## 2020-03-28 LAB — HM DIABETES EYE EXAM

## 2020-04-02 NOTE — Progress Notes (Signed)
Internal Medicine Clinic Attending ° °Case discussed with Dr. Krienke  At the time of the visit.  We reviewed the resident’s history and exam and pertinent patient test results.  I agree with the assessment, diagnosis, and plan of care documented in the resident’s note.  °

## 2020-04-05 ENCOUNTER — Other Ambulatory Visit: Payer: Self-pay

## 2020-04-05 ENCOUNTER — Ambulatory Visit (INDEPENDENT_AMBULATORY_CARE_PROVIDER_SITE_OTHER): Payer: Medicare HMO | Admitting: Orthotics

## 2020-04-05 DIAGNOSIS — E1151 Type 2 diabetes mellitus with diabetic peripheral angiopathy without gangrene: Secondary | ICD-10-CM

## 2020-04-05 DIAGNOSIS — M2041 Other hammer toe(s) (acquired), right foot: Secondary | ICD-10-CM

## 2020-04-05 DIAGNOSIS — M2042 Other hammer toe(s) (acquired), left foot: Secondary | ICD-10-CM | POA: Diagnosis not present

## 2020-04-05 DIAGNOSIS — M2031 Hallux varus (acquired), right foot: Secondary | ICD-10-CM

## 2020-04-09 ENCOUNTER — Other Ambulatory Visit: Payer: Self-pay

## 2020-04-09 ENCOUNTER — Ambulatory Visit
Admission: RE | Admit: 2020-04-09 | Discharge: 2020-04-09 | Disposition: A | Discharge status: Home / Self Care | Payer: Medicare HMO | Source: Ambulatory Visit | Attending: Nurse Practitioner | Admitting: Nurse Practitioner

## 2020-04-09 DIAGNOSIS — Z1231 Encounter for screening mammogram for malignant neoplasm of breast: Secondary | ICD-10-CM | POA: Diagnosis not present

## 2020-04-09 DIAGNOSIS — Z0000 Encounter for general adult medical examination without abnormal findings: Secondary | ICD-10-CM

## 2020-04-13 ENCOUNTER — Other Ambulatory Visit: Payer: Self-pay | Admitting: Internal Medicine

## 2020-04-13 DIAGNOSIS — R809 Proteinuria, unspecified: Secondary | ICD-10-CM

## 2020-04-13 DIAGNOSIS — R928 Other abnormal and inconclusive findings on diagnostic imaging of breast: Secondary | ICD-10-CM

## 2020-04-13 DIAGNOSIS — E1129 Type 2 diabetes mellitus with other diabetic kidney complication: Secondary | ICD-10-CM

## 2020-04-24 ENCOUNTER — Encounter: Payer: Self-pay | Admitting: Dietician

## 2020-04-24 ENCOUNTER — Ambulatory Visit (INDEPENDENT_AMBULATORY_CARE_PROVIDER_SITE_OTHER): Payer: Medicare HMO | Admitting: Internal Medicine

## 2020-04-24 VITALS — BP 147/63 | HR 81 | Temp 98.6°F | Ht 66.0 in | Wt 176.1 lb

## 2020-04-24 DIAGNOSIS — R809 Proteinuria, unspecified: Secondary | ICD-10-CM

## 2020-04-24 DIAGNOSIS — E1129 Type 2 diabetes mellitus with other diabetic kidney complication: Secondary | ICD-10-CM

## 2020-04-24 DIAGNOSIS — L280 Lichen simplex chronicus: Secondary | ICD-10-CM

## 2020-04-24 DIAGNOSIS — L853 Xerosis cutis: Secondary | ICD-10-CM

## 2020-04-24 DIAGNOSIS — I10 Essential (primary) hypertension: Secondary | ICD-10-CM | POA: Diagnosis not present

## 2020-04-24 LAB — POCT GLYCOSYLATED HEMOGLOBIN (HGB A1C): Hemoglobin A1C: 8.4 % — AB (ref 4.0–5.6)

## 2020-04-24 LAB — GLUCOSE, CAPILLARY: Glucose-Capillary: 179 mg/dL — ABNORMAL HIGH (ref 70–99)

## 2020-04-24 MED ORDER — INSULIN ASPART 100 UNIT/ML FLEXPEN: 7.0000 [IU] | PEN_INJECTOR | Freq: Two times a day (BID) | SUBCUTANEOUS | 1 refills | Status: DC

## 2020-04-24 MED ORDER — OLMESARTAN MEDOXOMIL 20 MG PO TABS: 20.0000 mg | ORAL_TABLET | Freq: Every day | ORAL | 5 refills | Status: DC

## 2020-04-24 MED ORDER — TRIAMCINOLONE ACETONIDE 0.025 % EX OINT: TOPICAL_OINTMENT | CUTANEOUS | 3 refills | Status: DC

## 2020-04-24 NOTE — Assessment & Plan Note (Signed)
Patient reports that she the rash that she was experienced has gone away however states that she is now having some generalized itchiness on her arms and her legs.  She states that she has had this previously and has had a lot of dry skin.  On exam she does have significantly dry skin, no obvious rash or lesions noted.  Discussed keeping skin well-hydrated and we could do a short course of Kenalog cream.

## 2020-04-24 NOTE — Patient Instructions (Addendum)
[name],  It was a pleasure to see you today. Thank you for coming in.   Today we discussed your diabetes. In regards to this please continue your current medications. Please add 7 units insulin aspart to your 2 largest meals, this looks like they are breakfast and lunch. Please check your blood sugars prior to eating and don't take the insulin aspart if it's <90.     We also discussed your blood pressure. This was a little high today. I have switched your losartan to olmesartan.   Please return to clinic in 1 month or sooner if needed.   Thank you again for coming in.   [name].

## 2020-04-24 NOTE — Assessment & Plan Note (Signed)
Patient is currently on Tresiba 20 units daily, Jardiance 25 mg daily, and Janumet 50-1000 mg twice daily.  CBGs have been better controlled however still not optimal.  She is greater than 259% of the time, 1 8110 to 5034% of the time, and in target range 55% of the time,.  She does have low blood sugar readings 2% of the time, states that she does have some lightheadedness and dizziness when she is a little blood sugar readings and will need to eat something around that time.  Overall it appears that her CBGs in the morning are significantly more elevated than in the evening.  She states that she eats breakfast and a large lunch, and sometimes does not eat much at dinner.  She also drinks coffee and sodas during the day.  She denies any polyuria, polydipsia, nausea, vomiting, numbness, tingling, headaches, or other symptoms. A1c today is 8.4, down from 11.5 months ago.  Patient has had improvement however still not at goal.  Discussed addition of short acting insulin during the morning and afternoon meals for better blood sugar control and she is in agreement.  -Continue Tresiba 20 units daily -Add insulin aspart 7 units twice daily with meals, breakfast and lunch, advised on hold precautions -Continue Jardiance 25 mg daily -Continue Janumet 50-1000 mg twice daily -RTC in 1 month to assess blood sugars -Could consider switching her from DPP 4 to GLP-1 to decrease risk of hypoglycemia

## 2020-04-24 NOTE — Progress Notes (Signed)
CC: DM, HTN, and generalized itchiness  HPI:  Krista Sosa is a 69 y.o. with the history listed below presenting for diabetes, HTN, and generalized itchiness.   Past Medical History:  Diagnosis Date  . Allergy   . Asthma, chronic 06/09/2006  . Bilateral cataracts 05/07/2016   s/p left cataract extraction 10/02/2016 and right cataract extraction 10/16/2016  . Bronchiectasis without complication ([place]) 47/42/5956   Mild left lower lobe bronchiectasis seen on CT scan of the thorax in December 2019  . Cataract    bil eyes  . Essential hypertension 02/12/2018  . Gastroesophageal reflux disease 08/13/2011  . Glaucoma    Per patient report  . Hx of adenomatous colonic polyps 11/16/2008  . Hypertension    pt said she is supposed to take med but she is not taking it.  . Hypertensive retinopathy, grade 2, bilateral 05/07/2016  . Latent tuberculosis    dx 03/17/08 - tx with INH X 9 months  . Lichenoid dermatitis 02/13/2012   Previously treated with Triamcinolone 0.025% cream Q8-12H PRN   . Migraine headache 08/06/2018  . Osteoarthritis, generalized 05/21/2006   Diffuse: cervical spine, right shoulder, left wrist Ineffective medications: Tylenol, ASA, Naprosyn, Ibuprofen, Meloxicam    . Overweight (BMI 25.0-29.9) 02/13/2012  . Perennial allergic rhinitis with seasonal variation 06/09/2006  . Persistent microalbuminuria associated with type II diabetes mellitus ([place]) 05/21/2006  . Sickle cell anemia (HCC)    pt has sickle cell trait per pt  . Sickle cell trait ([place]) 02/13/2012  . Tobacco abuse disorder 06/09/2006  . Tubular adenoma of rectum 11/16/2008   5 mm, excised endoscopically 11/16/2008, repeat colonoscopy due July 2015   . Ulnar neuropathy of right upper extremity 02/14/2016  . Urinary tract disease    JICP   Review of Systems:   Constitutional: Negative for chills and fever.  Respiratory: Negative for shortness of breath.   Cardiovascular: Negative for chest pain and leg  swelling.  Gastrointestinal: Negative for abdominal pain, nausea and vomiting.  Neurological: Negative for dizziness and headaches.   Physical Exam:  Vitals:   04/24/20 1536 04/24/20 1540  BP: (!) 154/72 (!) 147/63  Pulse: 64 81  Temp: 98.6 F (37 C)   TempSrc: Oral   SpO2: 96%   Weight: 176 lb 1.6 oz (79.9 kg)   Height: 5\' 6"  (1.676 m)    Physical Exam HENT:     Head: Normocephalic and atraumatic.  Eyes:     Conjunctiva/sclera: Conjunctivae normal.     Pupils: Pupils are equal, round, and reactive to light.  Neck:     Thyroid: No thyromegaly.  Cardiovascular:     Rate and Rhythm: Normal rate and regular rhythm.     Heart sounds: Normal heart sounds. No murmur heard.  No friction rub. No gallop.   Pulmonary:     Effort: Pulmonary effort is normal. No respiratory distress.     Breath sounds: Normal breath sounds. No wheezing.  Abdominal:     General: Bowel sounds are normal. There is no distension.     Palpations: Abdomen is soft.  Musculoskeletal:        General: Normal range of motion.     Cervical back: Normal range of motion and neck supple.  Skin:    General: Skin is warm and dry.     Findings: No erythema.     Comments: Diffuse dry skin, no flaking or scaling noted  Neurological:     Mental Status: She is alert and  oriented to person, place, and time.     Gait: Gait is intact.  Psychiatric:        Mood and Affect: Mood and affect normal.      Assessment & Plan:   See Encounters Tab for problem based charting.  Patient discussed with Dr. Heber [place]

## 2020-04-24 NOTE — Assessment & Plan Note (Signed)
Patient currently on HCTZ 12.5 mg daily and losartan 50 mg daily.  Denies any issues taking Krista Sosa medications.  Does not check Krista Sosa blood pressures at home.  Denies any headaches, lightheadedness, dizziness, chest pain, or shortness of breath.  Blood pressure today is elevated at 154/72, repeat was 147/63.  Patient is above goal. -Continue HCTZ 12.5 mg daily -Switch losartan 50 mg daily to olmesartan 20 mg  -RTC in 1 month

## 2020-04-27 NOTE — Progress Notes (Signed)
Internal Medicine Clinic Attending ° °Case discussed with Dr. Krienke  At the time of the visit.  We reviewed the resident’s history and exam and pertinent patient test results.  I agree with the assessment, diagnosis, and plan of care documented in the resident’s note.  °

## 2020-04-30 ENCOUNTER — Ambulatory Visit
Admission: RE | Admit: 2020-04-30 | Discharge: 2020-04-30 | Disposition: A | Discharge status: Home / Self Care | Payer: Medicare HMO | Source: Ambulatory Visit | Attending: Internal Medicine | Admitting: Internal Medicine

## 2020-04-30 ENCOUNTER — Other Ambulatory Visit: Payer: Self-pay

## 2020-04-30 DIAGNOSIS — R922 Inconclusive mammogram: Secondary | ICD-10-CM | POA: Diagnosis not present

## 2020-04-30 DIAGNOSIS — R928 Other abnormal and inconclusive findings on diagnostic imaging of breast: Secondary | ICD-10-CM

## 2020-04-30 DIAGNOSIS — N6489 Other specified disorders of breast: Secondary | ICD-10-CM | POA: Diagnosis not present

## 2020-05-07 ENCOUNTER — Telehealth: Payer: Self-pay

## 2020-05-07 NOTE — Telephone Encounter (Signed)
Pt states diabetes med is causing her to break out. Pt do not know the name of the medicine, please call pt back.

## 2020-05-08 ENCOUNTER — Telehealth: Payer: Self-pay | Admitting: Pharmacist

## 2020-05-08 NOTE — Telephone Encounter (Addendum)
Patient called clinic on 05/07/2020 regarding her insulin medications causing a rash since she began taking them.  I spoke with patient on phone today and she mentioned she quit taking all of her insulin medication on 05/05/2020 due to a rash and "knots" on her body that she has had since beginning insulin therapy.   Insulin therapy: insulin aspart (NOVOLOG) 7 units BID with breakfast and lunch, insulin degludec (TRESIBA) 20 units daily  Patient states the "rash will go away and then turn into knots." Her previous telephone encounter mentions a "knot" on her neck and the patient states she did have a "knot" in that area and on 05/05/2020 her neck was swollen.  Currently, she no longer has the rash or "knots."  Since stopping the insulin the patient states her blood glucose has been running high. The highest it has been was in the 300's but she states it constantly goes up and down. This is not unusual for her as she mentions having the same problem with her blood glucose while on insulin.   Patient denies polyuria but has polyphagia and polydipsia.  Discussed in great detail the patients concern regarding insulin therapy. At this time patient is refusing all further insulin. She has an appointment with Dr. Gilford Rile on 05/09/2020. The patient would greatly benefit from continued insulin therapy due to A1C recently decreasing from 11.5 to 8.4 while on therapy and continued hyperglycemic symptoms and elevated blood glucose since stopping.

## 2020-05-08 NOTE — Telephone Encounter (Signed)
Hi Krista Sosa, Thank you for calling her. I am copying Rachelle as it sounds like [name] is suspecting a medication reaction.

## 2020-05-08 NOTE — Telephone Encounter (Signed)
Pt states since she started taking that insulin pen she has a very itchy rash. States she also has a "big knot on the back of my neck" "I quit taking that pen" appt dr winters wed 12/15 at 1515

## 2020-05-09 ENCOUNTER — Other Ambulatory Visit: Payer: Self-pay

## 2020-05-09 ENCOUNTER — Ambulatory Visit (INDEPENDENT_AMBULATORY_CARE_PROVIDER_SITE_OTHER): Payer: Medicare HMO | Admitting: Internal Medicine

## 2020-05-09 ENCOUNTER — Encounter: Payer: Self-pay | Admitting: Internal Medicine

## 2020-05-09 DIAGNOSIS — E1129 Type 2 diabetes mellitus with other diabetic kidney complication: Secondary | ICD-10-CM

## 2020-05-09 DIAGNOSIS — R809 Proteinuria, unspecified: Secondary | ICD-10-CM | POA: Diagnosis not present

## 2020-05-09 MED ORDER — INSULIN ASPART 100 UNIT/ML FLEXPEN
7.0000 [IU] | PEN_INJECTOR | Freq: Two times a day (BID) | SUBCUTANEOUS | 1 refills | Status: DC
Start: 2020-05-09 — End: 2020-07-16

## 2020-05-09 MED ORDER — FAMOTIDINE 20 MG PO TABS: 20.0000 mg | ORAL_TABLET | Freq: Two times a day (BID) | ORAL | 1 refills | Status: DC

## 2020-05-09 MED ORDER — INSULIN DEGLUDEC 100 UNIT/ML ~~LOC~~ SOPN: 20.0000 [IU] | PEN_INJECTOR | Freq: Every day | SUBCUTANEOUS | 1 refills | Status: DC

## 2020-05-09 MED ORDER — INSULIN ASPART 100 UNIT/ML FLEXPEN: 7.0000 [IU] | PEN_INJECTOR | Freq: Two times a day (BID) | SUBCUTANEOUS | 1 refills | Status: DC

## 2020-05-09 NOTE — Patient Instructions (Addendum)
To Ms. Pippenger,  It was a pleasure meeting you today! Today we discussed your diabetes medications and your itchiness.   1. For your diabetes, I want you to take   - TRESIBA, this is your long acting insulin, please take 20 units once daily.   - NOVOLOG/, this is your short acting insulin. Please take 7 units twice daily with meals, breakfast and lunch  2. For your itchiness, please continue benadryl, and kenalog cream. I have also added on Pepcid. Please take as directed.   If you experiencing worsening symptoms, please call the clinic. I am attaching a handout with signs and symptoms to look out for. Have a happy holiday season! Sincerely,  Maudie Mercury, MD Anaphylactic Reaction, Adult An anaphylactic reaction (anaphylaxis) is a sudden, severe allergic reaction by the body's disease-fighting system (immune system). Anaphylaxis can be life-threatening. This condition must be treated right away. Sometimes a person may need to be treated in the hospital. What are the causes? This condition is caused by exposure to a substance that you are allergic to (allergen). In response to this exposure, the body releases proteins (antibodies) and other compounds, such as histamine, into the bloodstream. This causes swelling in certain tissues and loss of blood pressure to important areas, such as the heart and lungs. Common allergens that can cause anaphylaxis include:  Foods, especially peanuts, wheat, shellfish, milk, and eggs.  Medicines.  Insect bites or stings.  Blood or parts of blood received for treatment (transfusions).  Chemicals, such as dyes, latex, and contrast material that is used for medical tests. What increases the risk? This condition is more likely to occur in people who:  Have allergies.  Have had anaphylaxis before.  Have a family history of anaphylaxis.  Have certain medical conditions, including asthma and eczema. What are the signs or symptoms? Symptoms of  anaphylaxis may include:  Feeling warm in the face (flushed). This may include redness.  Itchy, red, swollen areas of skin (hives).  Swelling of the eyes, lips, face, mouth, tongue, or throat.  Difficulty breathing, speaking, or swallowing.  Noisy breathing (wheezing).  Dizziness or light-headedness.  Fainting.  Pain or cramping in the abdomen.  Vomiting.  Diarrhea. How is this diagnosed? This condition is diagnosed based on:  Your symptoms.  A physical exam.  Blood tests.  Recent history of exposure to allergens. How is this treated? If you think you are having an anaphylactic reaction, you should do the following right away:  Give yourself an epinephrine injection using what is commonly called an auto-injector "pen" (pre-filled automatic epinephrine injection device). Your health care provider will teach you how to use an auto-injector pen.  Call for emergency help. If you use a pen, you must still get emergency medical treatment in the hospital. Treatment in the hospital may include: ? Medicines to help:  Tighten your blood vessels (epinephrine).  Relieve itching and hives (antihistamines).  Reduce swelling (corticosteroids). ? Oxygen therapy to help you breathe. ? IV fluids to keep you hydrated. Follow these instructions at home: Safety  Always keep an auto-injector pen near you. This can be lifesaving if you have a severe anaphylactic reaction. Use your auto-injector pen as told by your health care provider.  Do not drive after an anaphylactic reaction until your health care provider approves.  Make sure that you, the members of your household, and your employer know: ? What you are allergic to, so it can be avoided. ? How to use an auto-injector pen to  give you an epinephrine injection.  Replace your epinephrine immediately after you use your auto-injector pen. This is important if you have another reaction.  If told by your health care provider, wear  a medical alert bracelet or necklace that states your allergy.  Learn the signs of anaphylaxis so that you can recognize and treat it right away.  Work with your health care providers to make an anaphylaxis plan. Preparation is important. General instructions  If you have hives or rash: ? Use an over-the-counter antihistamine as told by your health care provider. ? Apply cold, wet cloths (cold compresses) to your skin or take baths or showers in cool water. Avoid hot water.  Take over-the-counter and prescription medicines only as told by your health care provider.  Tell all your health care providers that you have an allergy.  Keep all follow-up visits as told by your health care provider. This is important. How is this prevented?  Avoid allergens that have caused an anaphylactic reaction in the past.  When you are at a restaurant, tell your server that you have an allergy. If you are not sure whether a menu item contains an ingredient that you are allergic to, ask your server. Where to find more information  American Academy of Allergy, Asthma and Immunology: aaaai.org  American Academy of Pediatrics: healthychildren.org Get help right away if:  You develop symptoms of an allergic reaction. You may notice them soon after you are exposed to a substance. Symptoms may include: ? Flushed skin. ? Hives. ? Swelling of the eyes, lips, face, mouth, tongue, or throat. ? Difficulty breathing, speaking, or swallowing. ? Wheezing. ? Dizziness or light-headedness. ? Fainting. ? Pain or cramping in the abdomen. ? Vomiting. ? Diarrhea.  You used epinephrine. You need more medical care even if the medicine seems to be working. This is important because anaphylaxis may happen again within 72 hours (rebound anaphylaxis). You may need more doses of epinephrine. These symptoms may represent a serious problem that is an emergency. Do not wait to see if the symptoms will go away. Do the  following right away:  Use the auto-injector pen as you have been instructed.  Get medical help. Call your local emergency services (911 in the [place] Do not drive yourself to the hospital. Summary  An anaphylactic reaction (anaphylaxis) is a sudden, severe allergic reaction by the body's disease-fighting system (immune system).  This condition can be life-threatening. If you have an anaphylactic reaction, get medical help right away.  Your health care provider may teach you how to use an auto-injector "pen" (pre-filled automatic epinephrine injection device) to give yourself a shot.  Always keep an auto-injector pen with you. This could save your life. Use it as told by your health care provider.  If you use epinephrine, you must still get emergency medical treatment, even if the medicine seems to be working. This information is not intended to replace advice given to you by your health care provider. Make sure you discuss any questions you have with your health care provider. Document Revised: 09/03/2017 Document Reviewed: 09/03/2017 Elsevier Patient Education  [place].

## 2020-05-11 ENCOUNTER — Encounter: Payer: Self-pay | Admitting: Internal Medicine

## 2020-05-11 NOTE — Assessment & Plan Note (Signed)
Patient presents for a visit today for side effects of her recent diabetes medications. Patient states that she has been taking her insulin medication, and has noticed generalized pruritis, with bumps, and one time a large knot on her neck. She states that she has tried lotions with have not helped, or benadryl.   She states that her symptoms began when she first took long acting insulin her pruritis. Her PCP changed the long acting insulin to [place] 20 Units daily and was recently started on another insulin product. Patient states that even with the benadryl and Kenalog cream have been ineffective. She stopped taking her insulin after her pruritis and bumps turned into a large knot on her neck. She reports flushing, palpitations, dizziness/light headedness associated with the large knot, and promptly stopped taking her insulin. She denied SHOB, difficulty swallowing or handling her secretions.   Patient currently takes Jardiance 25 mg daily, Janumet 50-1000 mg twice daily, and was prescribed Tresiba 20U daily and Aspart 7U twice daily with meals (but has stopped her inulins since this past weekend).   A/P Patient presents with pruritis and what sounds like possible urticaria associated with first started taking insulin. She does endorse some concerning signs for a possible hypersensitivity reaction, such as flushing, lightheadedness/dizziness, urticaria, and palpitations. Unfortunately, the patient does not have photos of the bumps for evaluation. Her pruritis continues even though she has been off her insulin regimen since last Saturday.   It was further discovered that the patient had been injecting herself twice daily with her [place] A[place], and has not been using the Aspart. We discussed taking her insulin properly to see if this would mitigate her symptoms. Patient states that she is willing to try to take her [place] Aas and Aspart as directed. We discussed her pruritis, and I will add an H2 blocker to her  regimen. We discussed anaphylactic episodes, and documentation was provided. I think it is fitting that if her symptoms continue that a referral to an allergist is fitting. While it is rare to be allergic to insulin, she may be allergic to some of the inactive ingredients.  - Continue Current Regimen, including insulin with close monitoring.  - Continue Benadryl 25 mg  - Continue Kennalog cream - Start Pepcid 20 mg daily

## 2020-05-11 NOTE — Progress Notes (Signed)
CC: Diabetes Medication Side Effects  HPI:  Ms.Jiovanna WILBUR LABUDA is a 69 y.o. M/F, with a PMH noted below, who presents to the clinic Diabetes medications. To see the management of their acute and chronic conditions, please see the A&P note under the Encounters tab.   Past Medical History:  Diagnosis Date  . Allergy   . Asthma, chronic 06/09/2006  . Bilateral cataracts 05/07/2016   s/p left cataract extraction 10/02/2016 and right cataract extraction 10/16/2016  . Bronchiectasis without complication ([place]) 47/65/4650   Mild left lower lobe bronchiectasis seen on CT scan of the thorax in December 2019  . Cataract    bil eyes  . Essential hypertension 02/12/2018  . Gastroesophageal reflux disease 08/13/2011  . Glaucoma    Per patient report  . Hx of adenomatous colonic polyps 11/16/2008  . Hypertension    pt said she is supposed to take med but she is not taking it.  . Hypertensive retinopathy, grade 2, bilateral 05/07/2016  . Latent tuberculosis    dx 03/17/08 - tx with INH X 9 months  . Lichenoid dermatitis 02/13/2012   Previously treated with Triamcinolone 0.025% cream Q8-12H PRN   . Migraine headache 08/06/2018  . Osteoarthritis, generalized 05/21/2006   Diffuse: cervical spine, right shoulder, left wrist Ineffective medications: Tylenol, ASA, Naprosyn, Ibuprofen, Meloxicam    . Overweight (BMI 25.0-29.9) 02/13/2012  . Perennial allergic rhinitis with seasonal variation 06/09/2006  . Persistent microalbuminuria associated with type II diabetes mellitus ([place]) 05/21/2006  . Sickle cell anemia (HCC)    pt has sickle cell trait per pt  . Sickle cell trait ([place]) 02/13/2012  . Tobacco abuse disorder 06/09/2006  . Tubular adenoma of rectum 11/16/2008   5 mm, excised endoscopically 11/16/2008, repeat colonoscopy due July 2015   . Ulnar neuropathy of right upper extremity 02/14/2016  . Urinary tract disease    JICP   Review of Systems:   Review of Systems  Constitutional: Negative for  chills, fever, malaise/fatigue and weight loss.  HENT: Negative for congestion, nosebleeds, sinus pain and sore throat.   Eyes: Negative for blurred vision and double vision.  Respiratory: Negative for cough, hemoptysis, shortness of breath and wheezing.   Cardiovascular: Positive for palpitations. Negative for chest pain, orthopnea, claudication and leg swelling.  Gastrointestinal: Negative for abdominal pain, constipation, diarrhea, nausea and vomiting.  Musculoskeletal: Negative for myalgias.  Skin: Positive for itching and rash.  Neurological: Positive for dizziness. Negative for tingling, tremors and headaches.     Physical Exam:  Vitals:   05/09/20 1527 05/09/20 1533  BP: (!) 152/87 135/70  Pulse: 91 90  Temp: 98.5 F (36.9 C)   TempSrc: Oral   SpO2: 100%   Weight: 175 lb 1.6 oz (79.4 kg)   Height: 5\' 6"  (1.676 m)    Physical Exam Vitals and nursing note reviewed.  Constitutional:      General: She is not in acute distress.    Appearance: Normal appearance. She is not ill-appearing or toxic-appearing.  HENT:     Head: Normocephalic and atraumatic.  Cardiovascular:     Rate and Rhythm: Normal rate and regular rhythm.     Pulses: Normal pulses.     Heart sounds: Normal heart sounds. No murmur heard. No friction rub. No gallop.   Pulmonary:     Effort: Pulmonary effort is normal.     Breath sounds: Normal breath sounds. No wheezing, rhonchi or rales.  Abdominal:     General: Abdomen is flat. Bowel  sounds are normal.     Tenderness: There is no abdominal tenderness. There is no guarding.  Musculoskeletal:     Right lower leg: No edema.     Left lower leg: No edema.  Skin:    General: Skin is warm.     Findings: No bruising, erythema, lesion or rash.  Neurological:     Mental Status: She is alert and oriented to person, place, and time.  Psychiatric:        Mood and Affect: Mood normal.        Behavior: Behavior normal.      Assessment & Plan:   See  Encounters Tab for problem based charting.  Patient discussed with Dr. Jimmye Norman

## 2020-05-11 NOTE — Progress Notes (Signed)
Internal Medicine Clinic Attending ? ?Case discussed with Dr. Winters  At the time of the visit.  We reviewed the resident?s history and exam and pertinent patient test results.  I agree with the assessment, diagnosis, and plan of care documented in the resident?s note.  ?

## 2020-05-22 ENCOUNTER — Encounter: Payer: Medicare HMO | Admitting: Internal Medicine

## 2020-06-20 ENCOUNTER — Encounter: Payer: Self-pay | Admitting: Internal Medicine

## 2020-06-20 ENCOUNTER — Ambulatory Visit (INDEPENDENT_AMBULATORY_CARE_PROVIDER_SITE_OTHER): Payer: Medicare Other | Admitting: Internal Medicine

## 2020-06-20 VITALS — BP 151/74 | HR 89 | Temp 98.5°F | Ht 66.0 in | Wt 170.3 lb

## 2020-06-20 DIAGNOSIS — T7840XD Allergy, unspecified, subsequent encounter: Secondary | ICD-10-CM

## 2020-06-20 DIAGNOSIS — I10 Essential (primary) hypertension: Secondary | ICD-10-CM

## 2020-06-20 DIAGNOSIS — R809 Proteinuria, unspecified: Secondary | ICD-10-CM

## 2020-06-20 DIAGNOSIS — E1129 Type 2 diabetes mellitus with other diabetic kidney complication: Secondary | ICD-10-CM

## 2020-06-20 LAB — GLUCOSE, CAPILLARY: Glucose-Capillary: 294 mg/dL — ABNORMAL HIGH (ref 70–99)

## 2020-06-20 LAB — POCT GLYCOSYLATED HEMOGLOBIN (HGB A1C): Hemoglobin A1C: 9.8 % — AB (ref 4.0–5.6)

## 2020-06-20 MED ORDER — GLIPIZIDE ER 2.5 MG PO TB24: 5.0000 mg | ORAL_TABLET | Freq: Every day | ORAL | 2 refills | Status: DC

## 2020-06-20 MED ORDER — HYDROCHLOROTHIAZIDE 12.5 MG PO TABS: 25.0000 mg | ORAL_TABLET | Freq: Every day | ORAL | 5 refills | Status: DC

## 2020-06-20 NOTE — Progress Notes (Unsigned)
CC: DM  HPI:  Krista Sosa is a 70 y.o. female with a past medical history stated below and presents today for DM. Please see problem based assessment and plan for additional details.  Past Medical History:  Diagnosis Date  . Allergy   . Asthma, chronic 06/09/2006  . Bilateral cataracts 05/07/2016   s/p left cataract extraction 10/02/2016 and right cataract extraction 10/16/2016  . Bronchiectasis without complication ([place]) 27/51/7001   Mild left lower lobe bronchiectasis seen on CT scan of the thorax in December 2019  . Cataract    bil eyes  . Essential hypertension 02/12/2018  . Gastroesophageal reflux disease 08/13/2011  . Glaucoma    Per patient report  . Hx of adenomatous colonic polyps 11/16/2008  . Hypertension    pt said she is supposed to take med but she is not taking it.  . Hypertensive retinopathy, grade 2, bilateral 05/07/2016  . Latent tuberculosis    dx 03/17/08 - tx with INH X 9 months  . Lichenoid dermatitis 02/13/2012   Previously treated with Triamcinolone 0.025% cream Q8-12H PRN   . Migraine headache 08/06/2018  . Osteoarthritis, generalized 05/21/2006   Diffuse: cervical spine, right shoulder, left wrist Ineffective medications: Tylenol, ASA, Naprosyn, Ibuprofen, Meloxicam    . Overweight (BMI 25.0-29.9) 02/13/2012  . Perennial allergic rhinitis with seasonal variation 06/09/2006  . Persistent microalbuminuria associated with type II diabetes mellitus ([place]) 05/21/2006  . Sickle cell anemia (HCC)    pt has sickle cell trait per pt  . Sickle cell trait ([place]) 02/13/2012  . Tobacco abuse disorder 06/09/2006  . Tubular adenoma of rectum 11/16/2008   5 mm, excised endoscopically 11/16/2008, repeat colonoscopy due July 2015   . Ulnar neuropathy of right upper extremity 02/14/2016  . Urinary tract disease    JICP    Current Outpatient Medications on File Prior to Visit  Medication Sig Dispense Refill  . albuterol (PROVENTIL HFA;VENTOLIN HFA) 108 (90 Base)  MCG/ACT inhaler Inhale 1-2 puffs into the lungs every 6 (six) hours as needed for wheezing or shortness of breath. 1 Inhaler 0  . atorvastatin (LIPITOR) 40 MG tablet Take 1 tablet (40 mg total) by mouth daily. 30 tablet 11  . Blood Glucose Monitoring Suppl (TRUE METRIX AIR GLUCOSE METER) w/Device KIT 1 Device by Does not apply route daily AND 1 strip daily. Use to test blood glucose 1-2 times daily. Dx: E11.29. 1 kit 0  . Blood Glucose Monitoring Suppl DEVI 1 each by Does not apply route daily for 1 day. 1 each 0  . empagliflozin (JARDIANCE) 25 MG TABS tablet Take 25 mg by mouth daily. 90 tablet 2  . famotidine (PEPCID) 20 MG tablet Take 1 tablet (20 mg total) by mouth 2 (two) times daily. 60 tablet 1  . Fluticasone-Salmeterol (ADVAIR) 250-50 MCG/DOSE AEPB Inhale 1 puff into the lungs 2 (two) times daily. 60 each 11  . glucose blood test strip Check blood glucose twice a day 100 each 12  . insulin aspart (NOVOLOG) 100 UNIT/ML FlexPen Inject 7 Units into the skin 2 (two) times daily with breakfast and lunch. 6 mL 1  . insulin degludec (TRESIBA) 100 UNIT/ML FlexTouch Pen Inject 20 Units into the skin daily. 15 mL 1  . Insulin Pen Needle (PEN NEEDLES) 32G X 4 MM MISC 1 each by Does not apply route in the morning, at noon, in the evening, and at bedtime. This is an emergency request. Patient moved and lost her other pen needles 200  each 1  . olmesartan (BENICAR) 20 MG tablet Take 1 tablet (20 mg total) by mouth daily. 30 tablet 5  . omeprazole (PRILOSEC) 40 MG capsule Take 1 capsule (40 mg total) by mouth daily. 90 capsule 3  . sitaGLIPtin-metformin (JANUMET) 50-1000 MG tablet Take 1 tablet by mouth 2 (two) times daily with a meal. 60 tablet 11  . Skin Protectants, Misc. (EUCERIN) cream Apply topically as needed for dry skin. 454 g 1  . triamcinolone (KENALOG) 0.025 % ointment APPLY TOPICALLY TWICE DAILY AS DIRECTED 30 g 3  . triamcinolone (NASACORT ALLERGY 24HR) 55 MCG/ACT AERO nasal inhaler Place 2  sprays into the nose daily as needed. 1 Inhaler 3  . TRUEPLUS LANCETS 33G MISC Use to test blood glucose 1-2 daily. E11.29 100 each 3  . [DISCONTINUED] Triamcinolone Acetonide (TRIAMCINOLONE 0.1 % CREAM : EUCERIN) CREA Apply 1 application topically 2 (two) times daily as needed. 454 each 3   No current facility-administered medications on file prior to visit.    Family History  Problem Relation Age of Onset  . Diabetes Mother   . Heart disease Mother 62  . Colon cancer Mother   . Cancer Mother        Colon  . Cancer Father        Lung  . Sickle cell anemia Sister   . Sickle cell anemia Brother   . Diabetes Sister   . Brain cancer Sister   . Cancer Sister        brain  . Lung cancer Brother   . Cancer Brother        Lung  . Healthy Sister   . Healthy Sister   . Healthy Sister   . Healthy Daughter   . Healthy Son   . Healthy Daughter   . Healthy Daughter   . Healthy Son   . Wilson's disease Maternal Uncle   . Breast cancer Maternal Aunt   . Breast cancer Other   . Esophageal cancer Neg Hx   . Rectal cancer Neg Hx   . Stomach cancer Neg Hx     Social History   Socioeconomic History  . Marital status: Single    Spouse name: Not on file  . Number of children: Not on file  . Years of education: Not on file  . Highest education level: Not on file  Occupational History  . Occupation: Retired  Tobacco Use  . Smoking status: Current Every Day Smoker    Packs/day: 0.50    Years: 25.00    Pack years: 12.50    Types: Cigarettes  . Smokeless tobacco: Never Used  . Tobacco comment: .5 ppd  Vaping Use  . Vaping Use: Never used  Substance and Sexual Activity  . Alcohol use: Yes    Alcohol/week: 0.0 standard drinks    Comment: occasionally  . Drug use: No    Comment: positive marijuana metabolite in 2010  . Sexual activity: Not Currently    Birth control/protection: Other-see comments    Comment: s/p tubal ligation  Other Topics Concern  . Not on file  Social  History Narrative   Current Social History 01/07/2019        Patient lives alone in a third floor apartment. There are steps with handrails up to the entrance the patient uses.       Patient's method of transportation is personal car.      The highest level of education was some high school: 11 th grade  The patient currently retired from [employer]. Works part-time as a Sports coach at [employer]      Identified important Relationships are "[place]"       Pets : dog, Environmental education officer / Fun: "Walking"       Current Stressors: "The news"       Religious / Personal Beliefs: "Holiness"       L. [place], RN, BSN       Social Determinants of Health   Financial Resource Strain: Not on file  Food Insecurity: Not on file  Transportation Needs: Not on file  Physical Activity: Not on file  Stress: Not on file  Social Connections: Not on file  Intimate Partner Violence: Not on file    Review of Systems: ROS negative except for what is noted on the assessment and plan.  Vitals:   06/20/20 1525 06/20/20 1626  BP: (!) 140/92 (!) 151/74  Pulse: (!) 103 89  Temp: 98.5 F (36.9 C)   TempSrc: Oral   SpO2: 98%   Weight: 170 lb 4.8 oz (77.2 kg)   Height: _0  (1.676 m)      Physical Exam: Physical Exam Constitutional:      Appearance: Normal appearance.  Cardiovascular:     Rate and Rhythm: Normal rate.     Pulses: Normal pulses.     Heart sounds: Normal heart sounds.  Pulmonary:     Effort: Pulmonary effort is normal.     Breath sounds: Normal breath sounds.  Abdominal:     General: Bowel sounds are normal.     Palpations: Abdomen is soft.     Tenderness: There is no abdominal tenderness.  Musculoskeletal:        General: Normal range of motion.     Cervical back: Normal range of motion.     Right lower leg: No edema.     Left lower leg: No edema.  Skin:    General: Skin is warm and dry.  Neurological:     Mental Status: She is alert and  oriented to person, place, and time. Mental status is at baseline.  Psychiatric:        Mood and Affect: Mood normal.      Assessment & Plan:   See Encounters Tab for problem based charting.  Patient discussed with Dr. Kelle Darting, D.O. [facility], PGY-2 Pager: [phone], Phone: [phone] Date 06/21/2020 Time 11:33 AM

## 2020-06-20 NOTE — Patient Instructions (Signed)
Thank you, Ms.[name] for allowing [place] to provide your care today. Today we discussed diabetes and blood pressure.    I have ordered the following labs for you:   Lab Orders     Glucose, capillary     BMP8+Anion Gap     POC Hbg A1C   Tests ordered today:  none  Referrals ordered today:    Referral Orders     Ambulatory referral to Allergy   I have ordered the following medication/changed the following medications:   Stop the following medications: Medications Discontinued During This Encounter  Medication Reason  . cetirizine (ZYRTEC) 10 MG tablet   . collagenase (SANTYL) ointment   . oxyCODONE-acetaminophen (PERCOCET) 5-325 MG tablet   . cyclobenzaprine (FLEXERIL) 10 MG tablet   . traZODone (DESYREL) 100 MG tablet   . sertraline (ZOLOFT) 100 MG tablet   . pseudoephedrine (SUDAFED) 30 MG tablet   . diclofenac sodium (VOLTAREN) 1 % GEL   . chlorpheniramine (CHLOR-TRIMETON) 4 MG tablet   . ondansetron (ZOFRAN) 4 MG tablet   . ibuprofen (IBU) 800 MG tablet   . hydrochlorothiazide (HYDRODIURIL) 12.5 MG tablet Reorder  . glipiZIDE (GLUCOTROL XL) 2.5 MG 24 hr tablet Reorder     Start the following medications: Meds ordered this encounter  Medications  . hydrochlorothiazide (HYDRODIURIL) 12.5 MG tablet    Sig: Take 2 tablets (25 mg total) by mouth daily.    Dispense:  30 tablet    Refill:  5  . glipiZIDE (GLUCOTROL XL) 2.5 MG 24 hr tablet    Sig: Take 2 tablets (5 mg total) by mouth daily with breakfast. 30 minute prior to eating a meal.    Dispense:  60 tablet    Refill:  2     Follow up: 2-3 months    Remember: to make an appointment with the Allergy clinic  Should you have any questions or concerns please call the internal medicine clinic at [phone].     Marianna Payment, D.O. [place]

## 2020-06-21 ENCOUNTER — Encounter: Payer: Self-pay | Admitting: Internal Medicine

## 2020-06-21 LAB — BMP8+ANION GAP
Anion Gap: 14 mmol/L (ref 10.0–18.0)
BUN/Creatinine Ratio: 24 (ref 12–28)
BUN: 15 mg/dL (ref 8–27)
CO2: 22 mmol/L (ref 20–29)
Calcium: 9.7 mg/dL (ref 8.7–10.3)
Chloride: 101 mmol/L (ref 96–106)
Creatinine, Ser: 0.62 mg/dL (ref 0.57–1.00)
GFR calc Af Amer: 108 mL/min/1.73
GFR calc non Af Amer: 94 mL/min/1.73
Glucose: 251 mg/dL — ABNORMAL HIGH (ref 65–99)
Potassium: 4.3 mmol/L (ref 3.5–5.2)
Sodium: 137 mmol/L (ref 134–144)

## 2020-06-21 NOTE — Assessment & Plan Note (Signed)
Patient presents for further evaluation management of her diabetes.  She is currently taking glipizide 2.5 mg daily, Jardiance 25 mg daily, Janumet 50-1000 mg twice daily.  She states that she stopped taking her insulin including aspart and Tresiba over the past month.  Her last A1c in November was 8.4.  It is since increased to 9.8 today.  Her blood sugar today is 294 and she denies any signs or symptoms of hyperglycemia.  She states that she stopped taking her insulin secondary to allergic reaction.  During her last office visit she describes developing hives, diffuse pruritus and a large knot on her neck secondary to insulin use.  She also reported associated flushing, palpitations, dizziness/lightheadedness at that time.  Patient was treated with Benadryl, Pepcid, and Kenalog cream.  She reports that she is hesitant to take insulin again due to this reaction which is why she stopped taking the medication.   Plan: -Increase glipizide to 5 mg daily  -Discontinue insulin therapy at this time. -I will put in an allergy referral to further evaluate her hypersensitivity to insulin.

## 2020-06-21 NOTE — Assessment & Plan Note (Addendum)
Patient presents for follow-up appointment for hypertension.  Her blood pressure is 140/92 which is above goal.  She is on hydrochlorothiazide 12.5 mg daily and olmesartan 20 mg daily.  She states that she is tolerating these medications well without side effects.    Blood Pressure 06/20/2020 05/09/2020 04/24/2020 03/26/2020 01/11/2020  BP 140/92 135/70 147/63 155/66 147/87  Some recent data might be hidden     Plan: 1.  I will increase her hydrochlorothiazide to 25 mg daily 2.  We will get a BMP today to assess kidney function electrolytes 3.  Counseled regarding diet and exercise changes to improve her blood pressure. 4.  Goal blood pressure of less than 130/80

## 2020-06-25 NOTE — Progress Notes (Signed)
Internal Medicine Clinic Attending  Case discussed with Dr. Coe  At the time of the visit.  We reviewed the resident's history and exam and pertinent patient test results.  I agree with the assessment, diagnosis, and plan of care documented in the resident's note.  

## 2020-06-29 DIAGNOSIS — E119 Type 2 diabetes mellitus without complications: Secondary | ICD-10-CM | POA: Diagnosis not present

## 2020-06-29 DIAGNOSIS — Z794 Long term (current) use of insulin: Secondary | ICD-10-CM | POA: Diagnosis not present

## 2020-07-02 ENCOUNTER — Other Ambulatory Visit: Payer: Self-pay

## 2020-07-02 ENCOUNTER — Ambulatory Visit (INDEPENDENT_AMBULATORY_CARE_PROVIDER_SITE_OTHER): Payer: Medicare Other | Admitting: Student

## 2020-07-02 ENCOUNTER — Encounter: Payer: Self-pay | Admitting: Student

## 2020-07-02 VITALS — BP 139/75 | HR 97 | Temp 98.8°F | Ht 66.0 in | Wt 167.4 lb

## 2020-07-02 DIAGNOSIS — L723 Sebaceous cyst: Secondary | ICD-10-CM | POA: Insufficient documentation

## 2020-07-02 HISTORY — DX: Sebaceous cyst: L72.3

## 2020-07-02 MED ORDER — OXYCODONE-ACETAMINOPHEN 7.5-325 MG PO TABS: 1.0000 | ORAL_TABLET | ORAL | 0 refills | Status: DC | PRN

## 2020-07-02 MED ORDER — DOXYCYCLINE MONOHYDRATE 100 MG PO TABS: 100.0000 mg | ORAL_TABLET | Freq: Two times a day (BID) | ORAL | 0 refills | Status: AC

## 2020-07-02 NOTE — Patient Instructions (Signed)
Ms. Ligman,  I am sorry that you are not feeling well.  You have an inflamed epidermal inclusion cyst of your upper back. I have prescribed doxycycline 100mg  twice daily for ten days. Please pick up this medication and begin taking twice daily. For the pain, I have prescribed a short-course of Percocet. I have also placed an urgent referral to dermatology for evaluation to have the cyst removed. If you have any questions, concerns or worsening symptoms, please do not hesitate to contact our clinic or present to the urgent care or emergency department.  Sincerely, Dr. Paulla Dolly, MD

## 2020-07-02 NOTE — Progress Notes (Signed)
CC: "Painful knot on back"  HPI:  [name] is a 70 y.o. with past medical history significant for T2DM who presents to clinic for evaluation of a lesion on her back.  Patient presents with one week history of a painful lesion on her skin. Patient reports that on Wednesday she noticed the development of a painful swollen "knot" on her upper middle back. She reports that her son looked at the lesion and was concerned that it looked like a cyst and commented on a hole in the center of the lesion. Patient reports that the pain has progressively worsened since onset. Additionally, patient endorses swelling in her neck and feeling feverish but denies checking her temperature at home.  Past Medical History:  Diagnosis Date  . Allergy   . Asthma, chronic 06/09/2006  . Bilateral cataracts 05/07/2016   s/p left cataract extraction 10/02/2016 and right cataract extraction 10/16/2016  . Bronchiectasis without complication ([place]) 03/22/2535   Mild left lower lobe bronchiectasis seen on CT scan of the thorax in December 2019  . Cataract    bil eyes  . Essential hypertension 02/12/2018  . Gastroesophageal reflux disease 08/13/2011  . Glaucoma    Per patient report  . Hx of adenomatous colonic polyps 11/16/2008  . Hypertension    pt said she is supposed to take med but she is not taking it.  . Hypertensive retinopathy, grade 2, bilateral 05/07/2016  . Latent tuberculosis    dx 03/17/08 - tx with INH X 9 months  . Lichenoid dermatitis 02/13/2012   Previously treated with Triamcinolone 0.025% cream Q8-12H PRN   . Migraine headache 08/06/2018  . Osteoarthritis, generalized 05/21/2006   Diffuse: cervical spine, right shoulder, left wrist Ineffective medications: Tylenol, ASA, Naprosyn, Ibuprofen, Meloxicam    . Overweight (BMI 25.0-29.9) 02/13/2012  . Perennial allergic rhinitis with seasonal variation 06/09/2006  . Persistent microalbuminuria associated with type II diabetes mellitus ([place])  05/21/2006  . Sickle cell anemia (HCC)    pt has sickle cell trait per pt  . Sickle cell trait ([place]) 02/13/2012  . Tobacco abuse disorder 06/09/2006  . Tubular adenoma of rectum 11/16/2008   5 mm, excised endoscopically 11/16/2008, repeat colonoscopy due July 2015   . Ulnar neuropathy of right upper extremity 02/14/2016  . Urinary tract disease    JICP   Review of Systems:  Endorses skin lesion, painful, warm, red, tender, neck swelling, fevers, chills.   Physical Exam:  Vitals:   07/02/20 1350 07/02/20 1352  BP: (!) 155/70 139/75  Pulse: 97 97  Temp: 98.8 F (37.1 C)   TempSrc: Oral   SpO2: 99%   Weight: 167 lb 6.4 oz (75.9 kg)   Height: 5\' 6"  (1.676 m)    Physical Exam Constitutional:      General: She is in acute distress.     Appearance: Normal appearance.  Neck:     Comments: Posterior cervical lymphadenopathy Skin:    Comments: Warm, erythematous, tender, firm, nodule of the middle, upper back with central punctum. (See media tab for clinical image)  Neurological:     Mental Status: She is alert.    Assessment & Plan:   Patient's lesion consistent with an inflamed epidermoid inclusion cyst. Patient offered the option to present to ED or Urgent Care for consideration of incision and drainage versus start oral antibiotic and urgent dermatology referral. Patient requested for the latter option. -Doxycyline 100mg  twice daily for 10 days -Urgent referral to dermatology -Percocet Q4H for pain -  Apply warm compress at home -Advised to present to ED or UC immediately if worsening symptoms  Patient seen with Dr. Jimmye Norman

## 2020-07-02 NOTE — Assessment & Plan Note (Signed)
SUBJECTIVE: Patient presents with one week history of a painful lesion on her skin. Patient reports that on Wednesday she noticed the development of a painful swollen "knot" on her upper middle back. She reports that her son looked at the lesion and was concerned that it looked like a cyst and commented on a hole in the center of the lesion. Patient reports that the pain has progressively worsened since onset. Additionally, patient endorses swelling in her neck and feeling feverish but denies checking her temperature at home.  OBJECTIVE: Vitals: Afebrile, hemodynamically stable Physical examination: Warm, firm, exquisitely tender, erythematous nodule of the middle upper back with a central punctum. Cervical adenopathy. (See media tab for clinical image)  ASSESSMENT/PLAN: Patient's lesion consistent with an inflamed epidermoid inclusion cyst. Patient offered the option to present to ED or Urgent Care for consideration of incision and drainage versus start oral antibiotic and urgent dermatology referral. Patient requested for the latter option. -Doxycyline 100mg  twice daily for 10 days -Urgent referral to dermatology -Percocet Q4H for pain -Apply warm compress at home -Advised to present to ED or UC immediately if worsening symptoms

## 2020-07-03 ENCOUNTER — Encounter: Payer: Medicare Other | Admitting: Student

## 2020-07-06 ENCOUNTER — Telehealth: Payer: Self-pay | Admitting: Internal Medicine

## 2020-07-06 ENCOUNTER — Other Ambulatory Visit: Payer: Self-pay | Admitting: *Deleted

## 2020-07-06 DIAGNOSIS — L723 Sebaceous cyst: Secondary | ICD-10-CM

## 2020-07-06 DIAGNOSIS — L02222 Furuncle of back [any part, except buttock]: Secondary | ICD-10-CM | POA: Diagnosis not present

## 2020-07-06 DIAGNOSIS — B9689 Other specified bacterial agents as the cause of diseases classified elsewhere: Secondary | ICD-10-CM | POA: Diagnosis not present

## 2020-07-06 DIAGNOSIS — L720 Epidermal cyst: Secondary | ICD-10-CM | POA: Diagnosis not present

## 2020-07-06 MED ORDER — OXYCODONE-ACETAMINOPHEN 7.5-325 MG PO TABS: 1.0000 | ORAL_TABLET | Freq: Three times a day (TID) | ORAL | 0 refills | Status: AC | PRN

## 2020-07-06 NOTE — Progress Notes (Signed)
Internal Medicine Clinic Attending  I saw and evaluated the patient.  I personally confirmed the key portions of the history and exam documented by Dr. Wynetta Emery and I reviewed pertinent patient test results.  The assessment, diagnosis, and plan were formulated together and I agree with the documentation in the resident's note.  Lesion is not yet ready to drain and is associated with surrounding cellulitis; antibiotic is appropriate with continued monitoring for I and D as abscess matures.

## 2020-07-06 NOTE — Telephone Encounter (Signed)
I will refill medication one additional time for her acute condition.

## 2020-07-06 NOTE — Telephone Encounter (Signed)
Call placed to Ms. [place] to check on her well being with respect to her infected back lesion. She states that it has continued to be very painful and that her grandson has noticed some "pus pockets" appearing near the skin surface, which have not yet drained.  She has appt today at noon with dermatologist.  Sounds as though the lesion is ready for drainage.  I advised her to call us is there are further questions or problems following this visit.

## 2020-07-06 NOTE — Telephone Encounter (Signed)
Call from pt stating she saw dermatologist for the cyst on her back; it was lanced. And she continues to c/o pain; requesting another refill on Oxycodone. Thanks

## 2020-07-06 NOTE — Telephone Encounter (Signed)
Called pt to let her know of Oxycodone refill. She stated thank-you.

## 2020-07-16 ENCOUNTER — Encounter: Payer: Self-pay | Admitting: Student

## 2020-07-16 ENCOUNTER — Ambulatory Visit (INDEPENDENT_AMBULATORY_CARE_PROVIDER_SITE_OTHER): Payer: Medicare Other | Admitting: Student

## 2020-07-16 VITALS — BP 141/70 | HR 104 | Temp 98.2°F | Wt 161.9 lb

## 2020-07-16 DIAGNOSIS — E1129 Type 2 diabetes mellitus with other diabetic kidney complication: Secondary | ICD-10-CM | POA: Diagnosis not present

## 2020-07-16 DIAGNOSIS — I10 Essential (primary) hypertension: Secondary | ICD-10-CM

## 2020-07-16 DIAGNOSIS — L723 Sebaceous cyst: Secondary | ICD-10-CM | POA: Diagnosis not present

## 2020-07-16 DIAGNOSIS — R809 Proteinuria, unspecified: Secondary | ICD-10-CM | POA: Diagnosis not present

## 2020-07-16 MED ORDER — EMPAGLIFLOZIN 25 MG PO TABS: 25.0000 mg | ORAL_TABLET | Freq: Every day | ORAL | 3 refills | Status: DC

## 2020-07-16 MED ORDER — JANUMET 50-1000 MG PO TABS: 1.0000 | ORAL_TABLET | Freq: Two times a day (BID) | ORAL | 3 refills | Status: DC

## 2020-07-16 MED ORDER — GLIPIZIDE ER 5 MG PO TB24: 5.0000 mg | ORAL_TABLET | Freq: Every day | ORAL | 3 refills | Status: DC

## 2020-07-16 MED ORDER — OLMESARTAN MEDOXOMIL-HCTZ 20-12.5 MG PO TABS: 1.0000 | ORAL_TABLET | Freq: Every day | ORAL | 11 refills | Status: DC

## 2020-07-16 NOTE — Patient Instructions (Addendum)
Ms. Chavero,  It was a pleasure seeing you in clinic today.  For the cyst on your back: I have placed a referral for you to have the cyst removed by General Surgery. You will be called to schedule an appointment with their office.  For your diabetes: I have refilled your diabetes medications (Janumet 50-1000 twice daily, jardiance 25mg  daily and glipizide 5mg  daily) to [facility]. Please pick up these medications and take them every day as prescribed. We will check a hemoglobin A1c in about 2 months.  For your high blood pressure: We ask that you stop taking the HCTZ 25mg  and olmesartan 20mg  tablets and instead take a combination pill of olmesartan-HCTZ 20-12.5mg  daily. I have sent the prescription to [facility].  If you have any questions or concerns, don't hesitate to reach out to our clinic.  Sincerely, Dr. Paulla Dolly, MD

## 2020-07-16 NOTE — Assessment & Plan Note (Signed)
Patient presents for follow-up of inflamed epidermoid cyst of the skin of the upper back. Since patient's last office visit, she has completed a 2-week course of doxycycline and had incision and drainage by dermatology. Patient saw dermatology PA who recommended patient obtain referral to [facility] for excision of the epidermoid cyst. Physical examination reveals a hyperpigmented nodule overlying the skin of the upper back without surrounding erythema or warmth. Patient's epidermoid cyst is not currently inflamed and should be ready for excision. -Referral to general surgery for excision of epidermoid inclusion cyst

## 2020-07-16 NOTE — Assessment & Plan Note (Signed)
Patient's blood pressure elevated to 141/70. She does not recall which medications she takes for her hypertension and appears unfamiliar with HCTZ and olmesartan. She reports that she has not taken either medication this morning. Patient would benefit from simplification of her medication regimen for her chronic conditions to promote better understanding and likelihood of adherence. -Discontinue HCTZ and olmesartan -Start HCTZ-olmesartan combination pill 12.5-20mg  with plan to adjust dose as needed

## 2020-07-16 NOTE — Assessment & Plan Note (Signed)
Patient is familiar with her current diabetes medication regimen and can recite her medications of glipizide 5mg  daily, janumet 50-1000mg  twice daily and jardiance 25mg  daily. She was unable to tolerate the addition of insulin to her regimen due to the development of skin rash concerning for hives. Her insulin was discontinued and patient was referred to allergist for evaluation. -Continue glipizide 5mg  daily, consider increasing to 10mg  daily -Continue janumet 50-1000mg  twice daily -Continue jardiance 25mg  daily -HbA1c in 2 months -Follow-up with allergist in March

## 2020-07-16 NOTE — Progress Notes (Signed)
CC: Follow-up epidermal inclusion cyst  HPI:  Krista Sosa is a 70 y.o. female with past medical history significant for HTN, T2DM and an inflamed epidermal inclusion cyst of the upper back who presents to clinic for follow-up. Refer to problem list for charting of this encounter.  Past Medical History:  Diagnosis Date  . Allergy   . Asthma, chronic 06/09/2006  . Bilateral cataracts 05/07/2016   s/p left cataract extraction 10/02/2016 and right cataract extraction 10/16/2016  . Bronchiectasis without complication ([place]) 03/22/2535   Mild left lower lobe bronchiectasis seen on CT scan of the thorax in December 2019  . Cataract    bil eyes  . Essential hypertension 02/12/2018  . Gastroesophageal reflux disease 08/13/2011  . Glaucoma    Per patient report  . Hx of adenomatous colonic polyps 11/16/2008  . Hypertension    pt said she is supposed to take med but she is not taking it.  . Hypertensive retinopathy, grade 2, bilateral 05/07/2016  . Latent tuberculosis    dx 03/17/08 - tx with INH X 9 months  . Lichenoid dermatitis 02/13/2012   Previously treated with Triamcinolone 0.025% cream Q8-12H PRN   . Migraine headache 08/06/2018  . Osteoarthritis, generalized 05/21/2006   Diffuse: cervical spine, right shoulder, left wrist Ineffective medications: Tylenol, ASA, Naprosyn, Ibuprofen, Meloxicam    . Overweight (BMI 25.0-29.9) 02/13/2012  . Perennial allergic rhinitis with seasonal variation 06/09/2006  . Persistent microalbuminuria associated with type II diabetes mellitus ([place]) 05/21/2006  . Sickle cell anemia ([facility])    pt has sickle cell trait per pt  . Sickle cell trait ([place]) 02/13/2012  . Tobacco abuse disorder 06/09/2006  . Tubular adenoma of rectum 11/16/2008   5 mm, excised endoscopically 11/16/2008, repeat colonoscopy due July 2015   . Ulnar neuropathy of right upper extremity 02/14/2016  . Urinary tract disease    JICP   Review of Systems:  Endorses upper back lesion, mild  discomfort of the lesion. Denies fevers, chills, myalgias, malaise, polyuria, polydipsia, chest pain, shortness of breath, headache, nausea, vomiting.  Physical Exam:  Vitals:   07/16/20 1334  BP: (!) 141/70  Pulse: (!) 104  Temp: 98.2 F (36.8 C)  TempSrc: Oral  SpO2: 99%  Weight: 161 lb 14.4 oz (73.4 kg)   Physical Exam Vitals reviewed.  Constitutional:      General: She is not in acute distress.    Appearance: Normal appearance. She is normal weight. She is not ill-appearing.  Cardiovascular:     Rate and Rhythm: Normal rate and regular rhythm.     Pulses: Normal pulses.     Heart sounds: Normal heart sounds.  Pulmonary:     Effort: Pulmonary effort is normal.     Breath sounds: Normal breath sounds.  Abdominal:     General: Abdomen is flat. Bowel sounds are normal.     Palpations: Abdomen is soft.     Tenderness: There is no abdominal tenderness.  Musculoskeletal:     Comments: Minimal tenderness to palpation of the skin lesion present on the upper back  Skin:    Capillary Refill: Capillary refill takes less than 2 seconds.     Findings: Lesion present.     Comments: Hyperpigmented nodule with overlying crust on upper back. No erythema or purulence. (See media tab, consider comparison to prior photo)  Neurological:     General: No focal deficit present.     Mental Status: She is alert. Mental status is at baseline.  Psychiatric:        Mood and Affect: Mood normal.        Behavior: Behavior normal.      Assessment & Plan:   See Encounters Tab for problem based charting.  Patient discussed with Dr. Jimmye Norman

## 2020-07-22 NOTE — Progress Notes (Signed)
Internal Medicine Clinic Attending  Case discussed with Dr. Wynetta Emery at the time of the visit.  We reviewed the resident's history and exam and pertinent patient test results.  I agree with the assessment, diagnosis, and plan of care documented in the resident's note.

## 2020-08-06 ENCOUNTER — Ambulatory Visit (INDEPENDENT_AMBULATORY_CARE_PROVIDER_SITE_OTHER): Payer: Medicare Other | Admitting: Allergy

## 2020-08-06 ENCOUNTER — Other Ambulatory Visit: Payer: Self-pay

## 2020-08-06 ENCOUNTER — Encounter: Payer: Self-pay | Admitting: Allergy

## 2020-08-06 VITALS — BP 122/62 | HR 80 | Temp 97.8°F | Resp 18 | Ht 66.0 in | Wt 163.8 lb

## 2020-08-06 DIAGNOSIS — L509 Urticaria, unspecified: Secondary | ICD-10-CM | POA: Diagnosis not present

## 2020-08-06 DIAGNOSIS — J45909 Unspecified asthma, uncomplicated: Secondary | ICD-10-CM

## 2020-08-06 DIAGNOSIS — T781XXD Other adverse food reactions, not elsewhere classified, subsequent encounter: Secondary | ICD-10-CM | POA: Diagnosis not present

## 2020-08-06 DIAGNOSIS — Z720 Tobacco use: Secondary | ICD-10-CM

## 2020-08-06 DIAGNOSIS — T50905D Adverse effect of unspecified drugs, medicaments and biological substances, subsequent encounter: Secondary | ICD-10-CM | POA: Diagnosis not present

## 2020-08-06 DIAGNOSIS — J4550 Severe persistent asthma, uncomplicated: Secondary | ICD-10-CM | POA: Diagnosis not present

## 2020-08-06 NOTE — Progress Notes (Signed)
New Patient Note  RE: DALINDA HEIDT MRN: [id number] DOB: [birth year] Date of Office Visit: 08/06/2020  Referring provider: Angelica Pou, MD Primary care provider: [name], MD  Chief Complaint: Allergy Testing  History of Present Illness: I had the pleasure of seeing Female Iafrate for initial evaluation at the [facility] on 08/07/2020. She is a 70 y.o. female, who is referred here by [name], MD for the evaluation of drug allergy.  Drug allergy: Patient was initially prescribed insulin about 6 months ago.  She was doing okay until 1 month after she started. She noted breaking out in hives all over her body about 15 minutes after the injection - novolog, tresiba, lantus.   Describes them as little bumps, "knots", itchy. Individual rashes lasts about a few hours. No ecchymosis upon resolution. Associated symptoms include: none. Suspected triggers are insulin? Denies any fevers, chills, foods, personal care products or recent infections. She has tried the following therapies: benadryl prn with good benefit.   Patient stopped all her insulin a few months ago and no rash/hives since she stopped the medication.  She is still having some itching though.   Currently on glipizide 75m, Jardiance 224mdaily, janumet 50-100011mID. Patient checks her sugars which run in the 200-300s range per her report.   According the EMR records - patient was on Novolog 100 Unit/mL flexpen, Tresiba 100 Unit/mL flextouch pen, Lantus solostar 100 Unit/mL Solostar pen.   Component     Latest Ref Rng & Units 01/09/2020 04/24/2020 06/20/2020  Hemoglobin A1C     4.0 - 5.6 % 11.5 (A) 8.4 (A) 9.8 (A)   Previous history of rash/hives: none. Patient is up to date with the following cancer screening tests: colonoscopy, mammogram. No history of latex allergy.  Novolog:  What are the ingredients in NovoLog? Active Ingredient: insulin aspart Inactive Ingredients:  disodium hydrogen phosphate dihydrate, glycerin, metacresol, phenol, sodium chloride, zinc, and Water for Injection, USP. Hydrochloric acid 10% and/or sodium hydroxide 10% may be added to adjust pH.  What are the ingredients in [place] Ingredient: insulin degludec Inactive Ingredients: glycerol, metacresol, phenol, water for injection, and zinc. Hydrochloric acid or sodium hydroxide may be added.  LANTUS (insulin glargine injection) contains 100 Units (3.6378 mg) insulin glargine. The 10 mL vial presentation contains the following inactive ingredients per mL: 30 mcg zinc, 2.7 mg m-cresol, 20 mg glycerol 85%, 20 mcg polysorbate 20, and water for injection  01/09/2020 PCP OV visit - started Lantus 15U daily.   03/26/2020 PCP OV visit: "She reports that ever since starting her Lantus she has been having episodes of breaking out in hives, states that it will occur approximately 30 minutes following her injection.  States that it is on her arms, legs, back, and feet, initially occurred every time she injected however recently has been decreasing to only 4-5 times per week.  She denies any shortness of breath, lip swelling, tongue swelling, diarrhea, headaches, or other symptoms.  She denies any other known precipitating factors, denies any changes in her laundry detergent, changes in medication, new diet, new animal exposure.  She reports that she recently moved however states that the episodes of hives occurred prior to the move.  She has been taking Benadryl which has been helping.  She does not currently have any rash at this time, no itchiness or redness noted.  Given the timing this could be related to her Lantus injections, will switch her from  Lantus to [place].  Patient is currently on Jardiance 25 mg daily, Janumet 50-1000 mg twice daily, and Lantus 15 units daily.  She states that she misplaced her medications for approximately 1 month due to the move however has been on it for the past  month.  Her CGM over the past 14 days showed an average glucose of 187.  Greater than 250 approximately 12% of the time, 181-250 43% of the time, 70-180 44% of the time, and 54-69 1% of the time.  She reports that she had some lightheadedness when her CBG was around 74.  Denies any polyuria, polydipsia, nausea, vomiting, headaches, lightheadedness, dizziness, or other symptoms.  She is concerned about hives related to her Lantus injections.  Discussed adjusting her long-acting insulin to [place] she is agreeable with this.  -Continue Jardiance 25 mg daily -Continue Janumet 50-1000 mg twice daily -Switch Lantus to [place] 20 units daily"  04/24/2020 PCP visit: "Patient reports that she the rash that she was experienced has gone away however states that she is now having some generalized itchiness on her arms and her legs.  She states that she has had this previously and has had a lot of dry skin.  On exam she does have significantly dry skin, no obvious rash or lesions noted.  Discussed keeping skin well-hydrated and we could do a short course of Kenalog cream.  Patient is currently on Tresiba 20 units daily, Jardiance 25 mg daily, and Janumet 50-1000 mg twice daily.  CBGs have been better controlled however still not optimal.  She is greater than 259% of the time, 1 8110 to 5034% of the time, and in target range 55% of the time,.  She does have low blood sugar readings 2% of the time, states that she does have some lightheadedness and dizziness when she is a little blood sugar readings and will need to eat something around that time.  Overall it appears that her CBGs in the morning are significantly more elevated than in the evening.  She states that she eats breakfast and a large lunch, and sometimes does not eat much at dinner.  She also drinks coffee and sodas during the day.  She denies any polyuria, polydipsia, nausea, vomiting, numbness, tingling, headaches, or other symptoms. A1c today is 8.4, down  from 11.5 months ago.  Patient has had improvement however still not at goal.  Discussed addition of short acting insulin during the morning and afternoon meals for better blood sugar control and she is in agreement.  -Continue Tresiba 20 units daily -Add insulin aspart 7 units twice daily with meals, breakfast and lunch, advised on hold precautions -Continue Jardiance 25 mg daily -Continue Janumet 50-1000 mg twice daily -RTC in 1 month to assess blood sugars -Could consider switching her from DPP 4 to GLP-1 to decrease risk of hypoglycemia"  05/09/2020 PCP visit: "Patient presents for a visit today for side effects of her recent diabetes medications. Patient states that she has been taking her insulin medication, and has noticed generalized pruritis, with bumps, and one time a large knot on her neck. She states that she has tried lotions with have not helped, or benadryl.   She states that her symptoms began when she first took long acting insulin her pruritis. Her PCP changed the long acting insulin to [place] 20 Units daily and was recently started on another insulin product. Patient states that even with the benadryl and Kenalog cream have been ineffective. She stopped taking her insulin after  her pruritis and bumps turned into a large knot on her neck. She reports flushing, palpitations, dizziness/light headedness associated with the large knot, and promptly stopped taking her insulin. She denied SHOB, difficulty swallowing or handling her secretions.   Patient currently takes Jardiance 25 mg daily, Janumet 50-1000 mg twice daily, and was prescribed Tresiba 20U daily and Aspart 7U twice daily with meals (but has stopped her inulins since this past weekend).   A/P Patient presents with pruritis and what sounds like possible urticaria associated with first started taking insulin. She does endorse some concerning signs for a possible hypersensitivity reaction, such as flushing,  lightheadedness/dizziness, urticaria, and palpitations. Unfortunately, the patient does not have photos of the bumps for evaluation. Her pruritis continues even though she has been off her insulin regimen since last Saturday.   It was further discovered that the patient had been injecting herself twice daily with her [place] A[place], and has not been using the Aspart. We discussed taking her insulin properly to see if this would mitigate her symptoms. Patient states that she is willing to try to take her Tyler Aas and Aspart as directed. We discussed her pruritis, and I will add an H2 blocker to her regimen. We discussed anaphylactic episodes, and documentation was provided. I think it is fitting that if her symptoms continue that a referral to an allergist is fitting. While it is rare to be allergic to insulin, she may be allergic to some of the inactive ingredients.  - Continue Current Regimen, including insulin with close monitoring.  - Continue Benadryl 25 mg  - Continue Kennalog cream - Start Pepcid 20 mg daily"  06/20/2020 PCP visit: "She states that she stopped taking her insulin secondary to allergic reaction.  During her last office visit she describes developing hives, diffuse pruritus and a large knot on her neck secondary to insulin use.  She also reported associated flushing, palpitations, dizziness/lightheadedness at that time.  Patient was treated with Benadryl, Pepcid, and Kenalog cream.  She reports that she is hesitant to take insulin again due to this reaction which is why she stopped taking the medication. Plan: -Increase glipizide to 5 mg daily  -Discontinue insulin therapy at this time. -I will put in an allergy referral to further evaluate her hypersensitivity to insulin."  Asthma: She reports symptoms of chest tightness, shortness of breath, coughing, wheezing, nocturnal awakenings for 60+ years. Current medications include albuterol prn and Advair 258mg 1 puff BID x many years which  help. She reports not using aerochamber with inhalers. She tried the following inhalers: no. Main triggers are unknown. In the last month, frequency of symptoms: daily. Frequency of SABA use: 1x/week. Interference with physical activity: sometimes. In the last 12 months, emergency room visits/urgent care visits/doctor office visits or hospitalizations due to respiratory issues: 0. In the last 12 months, oral steroids courses: 0. Lifetime history of hospitalization for respiratory issues: no. Prior intubations: no. History of pneumonia: no. She was evaluated by allergist or pulmonologist in the past. Smoking exposure: 1/2pppd x 40 yrs. Up to date with flu vaccine: yes. Up to date with pneumonia vaccine: yes. Up to date with COVID-19 vaccine: yes.  History of reflux: yes but takes no medications for this.   Assessment and Plan: BLiesais a 70y.o. female with: Drug reaction Started to break out in pruritic rash and "knots" on her back 1 month after starting insulin - usually within 15 minutes of injection. Initially on Lantus 15U, then switched to [place], then  added Aspart 7U BID. Stopped all insulin and symptoms resolved except still has some itching. Denies history of hives. Diabetes is uncontrolled on oral meds. Patient discarded all of her insulin and does not have any at home. Denies any other changes in diet, meds or personal care products around this time.   Continue to avoid insulin.  Will reach out to PCP and ask what insulin they want to try next.  Depending on what they want to try - we will do skin testing and challenge to the drug.  If patient has significant positive on skin testing and/or fails drug challenge, there have been some success with undergoing insulin desensitization which may be an option for her.   Most of the insulin preparations contain other inactive ingredients and the most common additives that are known to trigger immediate reactions are protamine, cresol and  possibly latex.   Unfortunately, our office does not have access to protamine, cresol nor insulin to skin test for.  If it's not possible for our office to obtain these materials, patient may need to be referred out to an academic allergy department to undergo these testing.  Get bloodwork as below to rule out other etiologies of the rash/itching.  Not well controlled asthma without complication Patient is a current smoker with not well controlled asthma symptoms. Currently on Advair 259mg 1 puff twice a day and albuterol prn.   Today's spirometry showed: No overt abnormalities noted given today's efforts. She had 10% improvement in FEV1 post-bronchodilator treatment with no clinical change. . Daily controller medication(s): Advair 2534m 1 puff twice a day and rinse mouth after each use.  . May use albuterol rescue inhaler 2 puffs every 4 to 6 hours as needed for shortness of breath, chest tightness, coughing, and wheezing. May use albuterol rescue inhaler 2 puffs 5 to 15 minutes prior to strenuous physical activities. Monitor frequency of use.   Decrease smoking  Asthma needs to be under good control prior to undergoing drug challenges.   Adverse food reaction Pineapple causes itching.  Continue to avoid pineapples for now.  For mild symptoms you can take over the counter antihistamines such as Benadryl and monitor symptoms closely. If symptoms worsen or if you have severe symptoms including breathing issues, throat closure, significant swelling, whole body hives, severe diarrhea and vomiting, lightheadedness then seek immediate medical care.  Check pineapple IgE.  Return for Drug skin testing/challenge.  No orders of the defined types were placed in this encounter.   Lab Orders     Latex, IgE     Alpha-Gal Panel     ANA w/Reflex     CBC with Differential/Platelet     Chronic Urticaria     Comprehensive metabolic panel     C-reactive protein     Sedimentation rate      Tryptase     Thyroid Cascade Profile     Allergen, Pineapple, f210  Other allergy screening: Rhino conjunctivitis: no Food allergy:   Pineapples cause some itching Medication allergy: yes Hymenoptera allergy: no Urticaria: no  Eczema: yes  History of recurrent infections suggestive of immunodeficency: no  Diagnostics: Spirometry:  Tracings reviewed. Her effort: It was hard to get consistent efforts and there is a question as to whether this reflects a maximal maneuver. FVC: 2.29L FEV1: 1.84L, 88% predicted FEV1/FVC ratio: 80% Interpretation: No overt abnormalities noted given today's efforts. She had 10% improvement in FEV1 post-bronchodilator treatment with no clinical change. Please see scanned spirometry results for details.  Past Medical History: Patient Active Problem List   Diagnosis Date Noted  . Drug reaction 08/07/2020  . Urticaria 08/07/2020  . Adverse food reaction 08/07/2020  . Not well controlled asthma without complication 22/97/9892  . Tobacco user 08/07/2020  . Inflamed epidermoid cyst of skin 07/02/2020  . Hives 03/26/2020  . Goiter 05/30/2019  . Split ear lobe 01/04/2019  . Radiculopathy affecting upper extremity 01/02/2019  . Migraine headache 08/06/2018  . Xerosis of skin 08/06/2018  . Bronchiectasis without complication ([place]) 11/94/1740  . Chronic cough 05/05/2018  . Essential hypertension 02/12/2018  . Major depressive disorder, recurrent episode, mild with anxious distress ([place]) 06/05/2017  . Hypertensive retinopathy, grade 2, bilateral 05/07/2016  . Healthcare maintenance 09/03/2012  . Lichenoid dermatitis 02/13/2012  . Sickle cell trait ([place]) 02/13/2012  . Overweight (BMI 25.0-29.9) 02/13/2012  . Gastroesophageal reflux disease without esophagitis 08/13/2011  . Hx of adenomatous colonic polyps 11/16/2008  . Chest pain 01/25/2008  . Tobacco abuse disorder 06/09/2006  . Perennial allergic rhinitis with seasonal variation 06/09/2006  . Mild  intermittent asthma 06/09/2006  . Type 2 diabetes mellitus with microalbuminuria, without long-term current use of insulin ([place]) 05/21/2006  . Osteoarthritis, generalized 05/21/2006   Past Medical History:  Diagnosis Date  . Allergy   . Asthma, chronic 06/09/2006  . Bilateral cataracts 05/07/2016   s/p left cataract extraction 10/02/2016 and right cataract extraction 10/16/2016  . Bronchiectasis without complication ([place]) 81/44/8185   Mild left lower lobe bronchiectasis seen on CT scan of the thorax in December 2019  . Cataract    bil eyes  . Essential hypertension 02/12/2018  . Gastroesophageal reflux disease 08/13/2011  . Glaucoma    Per patient report  . Hx of adenomatous colonic polyps 11/16/2008  . Hypertension    pt said she is supposed to take med but she is not taking it.  . Hypertensive retinopathy, grade 2, bilateral 05/07/2016  . Latent tuberculosis    dx 03/17/08 - tx with INH X 9 months  . Lichenoid dermatitis 02/13/2012   Previously treated with Triamcinolone 0.025% cream Q8-12H PRN   . Migraine headache 08/06/2018  . Osteoarthritis, generalized 05/21/2006   Diffuse: cervical spine, right shoulder, left wrist Ineffective medications: Tylenol, ASA, Naprosyn, Ibuprofen, Meloxicam    . Overweight (BMI 25.0-29.9) 02/13/2012  . Perennial allergic rhinitis with seasonal variation 06/09/2006  . Persistent microalbuminuria associated with type II diabetes mellitus ([place]) 05/21/2006  . Sickle cell anemia ([facility])    pt has sickle cell trait per pt  . Sickle cell trait ([place]) 02/13/2012  . Tobacco abuse disorder 06/09/2006  . Tubular adenoma of rectum 11/16/2008   5 mm, excised endoscopically 11/16/2008, repeat colonoscopy due July 2015   . Ulnar neuropathy of right upper extremity 02/14/2016  . Urinary tract disease    JICP  . Urticaria    Past Surgical History: Past Surgical History:  Procedure Laterality Date  . APPENDECTOMY    . CATARACT EXTRACTION, BILATERAL Bilateral    OS  10/10/2016, OD 10/16/2016  . COLONOSCOPY  11/16/08  . FOOT SURGERY  07/31/2008   Exostectomy right hallus  . TONSILLECTOMY    . TUBAL LIGATION     Medication List:  Current Outpatient Medications  Medication Sig Dispense Refill  . albuterol (PROVENTIL HFA;VENTOLIN HFA) 108 (90 Base) MCG/ACT inhaler Inhale 1-2 puffs into the lungs every 6 (six) hours as needed for wheezing or shortness of breath. 1 Inhaler 0  . atorvastatin (LIPITOR) 40 MG tablet Take 1 tablet (40  mg total) by mouth daily. 30 tablet 11  . Blood Glucose Monitoring Suppl (TRUE METRIX AIR GLUCOSE METER) w/Device KIT 1 Device by Does not apply route daily AND 1 strip daily. Use to test blood glucose 1-2 times daily. Dx: E11.29. 1 kit 0  . empagliflozin (JARDIANCE) 25 MG TABS tablet Take 1 tablet (25 mg total) by mouth daily. 90 tablet 3  . Fluticasone-Salmeterol (ADVAIR) 250-50 MCG/DOSE AEPB Inhale 1 puff into the lungs 2 (two) times daily. 60 each 11  . glipiZIDE (GLUCOTROL XL) 5 MG 24 hr tablet Take 1 tablet (5 mg total) by mouth daily with breakfast. 30 minute prior to eating a meal. 90 tablet 3  . glucose blood test strip Check blood glucose twice a day 100 each 12  . olmesartan-hydrochlorothiazide (BENICAR HCT) 20-12.5 MG tablet Take 1 tablet by mouth daily. 30 tablet 11  . Skin Protectants, Misc. (EUCERIN) cream Apply topically as needed for dry skin. 454 g 1  . triamcinolone (KENALOG) 0.025 % ointment APPLY TOPICALLY TWICE DAILY AS DIRECTED 30 g 3  . TRUEPLUS LANCETS 33G MISC Use to test blood glucose 1-2 daily. E11.29 100 each 3  . Blood Glucose Monitoring Suppl DEVI 1 each by Does not apply route daily for 1 day. 1 each 0  . sitaGLIPtin-metformin (JANUMET) 50-1000 MG tablet Take 1 tablet by mouth 2 (two) times daily with a meal. 180 tablet 3   No current facility-administered medications for this visit.   Allergies: Allergies  Allergen Reactions  . Oxycodone Itching   Social History: Social History   Socioeconomic  History  . Marital status: Single    Spouse name: Not on file  . Number of children: Not on file  . Years of education: Not on file  . Highest education level: Not on file  Occupational History  . Occupation: Retired  Tobacco Use  . Smoking status: Current Every Day Smoker    Packs/day: 0.50    Years: 25.00    Pack years: 12.50    Types: Cigarettes  . Smokeless tobacco: Never Used  . Tobacco comment: .5 ppd  Vaping Use  . Vaping Use: Never used  Substance and Sexual Activity  . Alcohol use: Yes    Alcohol/week: 0.0 standard drinks    Comment: occasionally  . Drug use: No    Comment: positive marijuana metabolite in 2010  . Sexual activity: Not Currently    Birth control/protection: Other-see comments    Comment: s/p tubal ligation  Other Topics Concern  . Not on file  Social History Narrative   Current Social History 01/07/2019        Patient lives alone in a third floor apartment. There are steps with handrails up to the entrance the patient uses.       Patient's method of transportation is personal car.      The highest level of education was some high school: 11 th grade      The patient currently retired from [employer]. Works part-time as a Sports coach at [employer]      Identified important Relationships are "[place]"       Pets : dog, Environmental education officer / Fun: "Walking"       Current Stressors: "The news"       Religious / Personal Beliefs: "Holiness"       L. Ducatte, RN, BSN       Social Determinants of Health   Financial Resource Strain:  Not on file  Food Insecurity: Not on file  Transportation Needs: Not on file  Physical Activity: Not on file  Stress: Not on file  Social Connections: Not on file   Lives in an apartment. Smoking: 1/2 ppd x 40+ years Occupation: not employed  Environmental HistoryFreight forwarder in the house: no Charity fundraiser in the family room: no Carpet in the bedroom: no Heating: gas Cooling: central Pet:  1 dog x 2 months  Family History: Family History  Problem Relation Age of Onset  . Diabetes Mother   . Heart disease Mother 78  . Colon cancer Mother   . Cancer Mother        Colon  . Cancer Father        Lung  . Sickle cell anemia Sister   . Sickle cell anemia Brother   . Diabetes Sister   . Brain cancer Sister   . Cancer Sister        brain  . Lung cancer Brother   . Cancer Brother        Lung  . Healthy Sister   . Healthy Sister   . Healthy Sister   . Healthy Daughter   . Healthy Son   . Healthy Daughter   . Healthy Daughter   . Healthy Son   . Wilson's disease Maternal Uncle   . Breast cancer Maternal Aunt   . Breast cancer Other   . Esophageal cancer Neg Hx   . Rectal cancer Neg Hx   . Stomach cancer Neg Hx    Problem                               Relation Asthma                                   No  Eczema                                No  Food allergy                          No  Allergic rhino conjunctivitis     No   Review of Systems  Constitutional: Negative for appetite change, chills, fever and unexpected weight change.  HENT: Negative for congestion and rhinorrhea.   Eyes: Negative for itching.  Respiratory: Positive for chest tightness and shortness of breath. Negative for cough and wheezing.   Cardiovascular: Negative for chest pain.  Gastrointestinal: Negative for abdominal pain.  Genitourinary: Negative for difficulty urinating.  Skin: Negative for rash.  Neurological: Negative for headaches.   Objective: BP 122/62 (BP Location: Right Arm, Patient Position: Sitting, Cuff Size: Normal)   Pulse 80   Temp 97.8 F (36.6 C) (Temporal)   Resp 18   Ht 5' 6" (1.676 m)   Wt 163 lb 12.8 oz (74.3 kg)   LMP 08/27/2007   SpO2 98%   BMI 26.44 kg/m  Body mass index is 26.44 kg/m. Physical Exam Vitals and nursing note reviewed.  Constitutional:      Appearance: Normal appearance. She is well-developed.  HENT:     Head: Normocephalic and  atraumatic.     Right Ear: External ear normal.     Left Ear: External ear normal.  Nose: Nose normal.     Mouth/Throat:     Mouth: Mucous membranes are moist.     Pharynx: Oropharynx is clear.  Eyes:     Conjunctiva/sclera: Conjunctivae normal.  Cardiovascular:     Rate and Rhythm: Normal rate and regular rhythm.     Heart sounds: Normal heart sounds. No murmur heard. No friction rub. No gallop.   Pulmonary:     Effort: Pulmonary effort is normal.     Breath sounds: Normal breath sounds. No wheezing, rhonchi or rales.  Abdominal:     Palpations: Abdomen is soft.  Musculoskeletal:     Cervical back: Neck supple.  Skin:    General: Skin is warm.     Findings: No rash.  Neurological:     Mental Status: She is alert and oriented to person, place, and time.  Psychiatric:        Behavior: Behavior normal.    The plan was reviewed with the patient/family, and all questions/concerned were addressed.  It was my pleasure to see Taylyn today and participate in her care. Please feel free to contact me with any questions or concerns.  Sincerely,  Rexene Alberts, DO Allergy & Immunology  [facility] office: [place] office: [phone]

## 2020-08-06 NOTE — Patient Instructions (Addendum)
Allergic reaction:  Continue to avoid insulin.  Will reach out to your PCP and ask what insulin they want to try next.  Depending on what they want to try - we will do skin testing and challenge to the drug.  You must be off antihistamines for 3-5 days before. Must be in good health and not ill. Plan on being in the office for 2-3 hours and must bring in the drug you want to do the oral challenge for. You must call to schedule an appointment and specify it's for a drug challenge.  Get bloodwork:  We are ordering labs, so please allow 1-2 weeks for the results to come back. With the newly implemented Cures Act, the labs might be visible to you at the same time that they become visible to me. However, I will not address the results until all of the results are back, so please be patient.   Asthma: . Daily controller medication(s): Advair 238mcg 1 puff twice a day and rinse mouth after each use.  . May use albuterol rescue inhaler 2 puffs every 4 to 6 hours as needed for shortness of breath, chest tightness, coughing, and wheezing. May use albuterol rescue inhaler 2 puffs 5 to 15 minutes prior to strenuous physical activities. Monitor frequency of use.  . Asthma control goals:  o Full participation in all desired activities (may need albuterol before activity) o Albuterol use two times or less a week on average (not counting use with activity) o Cough interfering with sleep two times or less a month o Oral steroids no more than once a year o No hospitalizations  Decrease smoking  Food:  Continue to avoid pineapples for now.  For mild symptoms you can take over the counter antihistamines such as Benadryl and monitor symptoms closely. If symptoms worsen or if you have severe symptoms including breathing issues, throat closure, significant swelling, whole body hives, severe diarrhea and vomiting, lightheadedness then seek immediate medical care.  Follow up for insulin skin testing and drug  challenge.

## 2020-08-07 DIAGNOSIS — Z720 Tobacco use: Secondary | ICD-10-CM | POA: Insufficient documentation

## 2020-08-07 DIAGNOSIS — T50905A Adverse effect of unspecified drugs, medicaments and biological substances, initial encounter: Secondary | ICD-10-CM | POA: Insufficient documentation

## 2020-08-07 DIAGNOSIS — J45901 Unspecified asthma with (acute) exacerbation: Secondary | ICD-10-CM | POA: Insufficient documentation

## 2020-08-07 DIAGNOSIS — J45909 Unspecified asthma, uncomplicated: Secondary | ICD-10-CM | POA: Insufficient documentation

## 2020-08-07 DIAGNOSIS — T781XXA Other adverse food reactions, not elsewhere classified, initial encounter: Secondary | ICD-10-CM | POA: Insufficient documentation

## 2020-08-07 DIAGNOSIS — L509 Urticaria, unspecified: Secondary | ICD-10-CM | POA: Insufficient documentation

## 2020-08-07 NOTE — Assessment & Plan Note (Signed)
Pineapple causes itching.  Continue to avoid pineapples for now.  For mild symptoms you can take over the counter antihistamines such as Benadryl and monitor symptoms closely. If symptoms worsen or if you have severe symptoms including breathing issues, throat closure, significant swelling, whole body hives, severe diarrhea and vomiting, lightheadedness then seek immediate medical care.  Check pineapple IgE.

## 2020-08-07 NOTE — Assessment & Plan Note (Addendum)
Started to break out in pruritic rash and "knots" on her back 1 month after starting insulin - usually within 15 minutes of injection. Initially on Lantus 15U, then switched to [place] 20U, then added Aspart 7U BID. Stopped all insulin and symptoms resolved except still has some itching. Denies history of hives. Diabetes is uncontrolled on oral meds. Patient discarded all of her insulin and does not have any at home. Denies any other changes in diet, meds or personal care products around this time.   Continue to avoid insulin.  Will reach out to PCP and ask what insulin they want to try next.  Depending on what they want to try - we will do skin testing and challenge to the drug.  If patient has significant positive on skin testing and/or fails drug challenge, there have been some success with undergoing insulin desensitization which may be an option for her.   Most of the insulin preparations contain other inactive ingredients and the most common additives that are known to trigger immediate reactions are protamine, cresol and possibly latex.   Unfortunately, our office does not have access to protamine, cresol nor insulin to skin test for.  If it's not possible for our office to obtain these materials, patient may need to be referred out to an academic allergy department to undergo these testing.  Get bloodwork as below to rule out other etiologies of the rash/itching.

## 2020-08-07 NOTE — Assessment & Plan Note (Addendum)
Patient is a current smoker with not well controlled asthma symptoms. Currently on Advair 268mcg 1 puff twice a day and albuterol prn.   Today's spirometry showed: No overt abnormalities noted given today's efforts. She had 10% improvement in FEV1 post-bronchodilator treatment with no clinical change. . Daily controller medication(s): Advair 247mcg 1 puff twice a day and rinse mouth after each use.  . May use albuterol rescue inhaler 2 puffs every 4 to 6 hours as needed for shortness of breath, chest tightness, coughing, and wheezing. May use albuterol rescue inhaler 2 puffs 5 to 15 minutes prior to strenuous physical activities. Monitor frequency of use.   Decrease smoking  Asthma needs to be under good control prior to undergoing drug challenges.

## 2020-08-09 LAB — ALLERGEN, PINEAPPLE, F210: Pineapple IgE: 0.36 kU/L — AB

## 2020-08-13 ENCOUNTER — Telehealth: Payer: Self-pay | Admitting: Allergy

## 2020-08-13 NOTE — Telephone Encounter (Signed)
Please call Krista Sosa.  She has to reschedule the drug challenge/skin testing to insulin to be under either my schedule or under the NP schedule when I'm in the office with them.  Currently she is scheduled for Krista Sosa on 4/7 but I'm not in the office with her that day.  Please reschedule for 4/4 at 1:30PM Monday under Krista Sosa's schedule in [place]. I'm in the office with her that day.  Her PCP is going to have her pick up some insulin pens - she needs to bring them to the appointment with [place] and NOT use it beforehand.  Must be off antihistamines for 3-5 days before. Must be in good health and not ill. No vaccines/injections within the past 7 days. Not on any antibiotics.   Plan on being in the office for 2-3 hours and must bring in the insulin with her.

## 2020-08-14 NOTE — Telephone Encounter (Signed)
Noted,  Thank you!

## 2020-08-14 NOTE — Telephone Encounter (Signed)
I called Krista Sosa and rescheduled her for 08/27/20 at 1:30 with Chrissie, in the [place] office. Patient was made aware to get her insulin from PCP, no antihistamines 3-5 before, no injections 7 days before, not be on antibiotics and to be in good health. She stated understanding.

## 2020-08-17 LAB — COMPREHENSIVE METABOLIC PANEL
ALT: 14 IU/L (ref 0–32)
AST: 12 IU/L (ref 0–40)
Albumin/Globulin Ratio: 1.4 (ref 1.2–2.2)
Albumin: 4.3 g/dL (ref 3.8–4.8)
Alkaline Phosphatase: 76 IU/L (ref 44–121)
BUN/Creatinine Ratio: 16 (ref 12–28)
BUN: 13 mg/dL (ref 8–27)
Bilirubin Total: <0.2 mg/dL (ref 0.0–1.2)
CO2: 15 mmol/L — ABNORMAL LOW (ref 20–29)
Calcium: 9.7 mg/dL (ref 8.7–10.3)
Chloride: 102 mmol/L (ref 96–106)
Creatinine, Ser: 0.8 mg/dL (ref 0.57–1.00)
Globulin, Total: 3.1 g/dL (ref 1.5–4.5)
Glucose: 353 mg/dL — ABNORMAL HIGH (ref 65–99)
Potassium: 4.6 mmol/L (ref 3.5–5.2)
Sodium: 137 mmol/L (ref 134–144)
Total Protein: 7.4 g/dL (ref 6.0–8.5)
eGFR: 81 mL/min/{1.73_m2} (ref >59)

## 2020-08-17 LAB — CBC WITH DIFFERENTIAL/PLATELET
Basophils Absolute: 0 10*3/uL (ref 0.0–0.2)
Basos: 1 %
EOS (ABSOLUTE): 0.1 10*3/uL (ref 0.0–0.4)
Eos: 2 %
Hematocrit: 38 % (ref 34.0–46.6)
Hemoglobin: 12.7 g/dL (ref 11.1–15.9)
Immature Grans (Abs): 0 10*3/uL (ref 0.0–0.1)
Immature Granulocytes: 0 %
Lymphocytes Absolute: 2.8 10*3/uL (ref 0.7–3.1)
Lymphs: 44 %
MCH: 28.7 pg (ref 26.6–33.0)
MCHC: 33.4 g/dL (ref 31.5–35.7)
MCV: 86 fL (ref 79–97)
Monocytes Absolute: 0.4 10*3/uL (ref 0.1–0.9)
Monocytes: 6 %
Neutrophils Absolute: 3.1 10*3/uL (ref 1.4–7.0)
Neutrophils: 47 %
Platelets: 251 10*3/uL (ref 150–450)
RBC: 4.42 x10E6/uL (ref 3.77–5.28)
RDW: 13.4 % (ref 11.7–15.4)
WBC: 6.4 10*3/uL (ref 3.4–10.8)

## 2020-08-17 LAB — ANA W/REFLEX: Anti Nuclear Antibody (ANA): NEGATIVE

## 2020-08-17 LAB — TRYPTASE: Tryptase: 7.4 ug/L (ref 2.2–13.2)

## 2020-08-17 LAB — ALPHA-GAL PANEL
Allergen Lamb IgE: <0.1 kU/L
Beef IgE: <0.1 kU/L
IgE (Immunoglobulin E), Serum: 273 IU/mL (ref 6–495)
O215-IgE Alpha-Gal: <0.1 kU/L
Pork IgE: <0.1 kU/L

## 2020-08-17 LAB — C-REACTIVE PROTEIN: CRP: 2 mg/L (ref 0–10)

## 2020-08-17 LAB — SEDIMENTATION RATE: Sed Rate: 38 mm/hr (ref 0–40)

## 2020-08-17 LAB — LATEX, IGE: Latex IgE: 0.19 kU/L — AB

## 2020-08-17 LAB — THYROID CASCADE PROFILE: TSH: 0.838 u[IU]/mL (ref 0.450–4.500)

## 2020-08-17 LAB — CHRONIC URTICARIA: cu index: 4.2 (ref <10)

## 2020-08-20 ENCOUNTER — Other Ambulatory Visit: Payer: Self-pay | Admitting: Surgery

## 2020-08-20 DIAGNOSIS — L089 Local infection of the skin and subcutaneous tissue, unspecified: Secondary | ICD-10-CM | POA: Diagnosis not present

## 2020-08-20 DIAGNOSIS — L723 Sebaceous cyst: Secondary | ICD-10-CM | POA: Diagnosis not present

## 2020-08-22 ENCOUNTER — Encounter: Payer: Self-pay | Admitting: *Deleted

## 2020-08-22 NOTE — Progress Notes (Unsigned)

## 2020-08-22 NOTE — Progress Notes (Unsigned)
Things That May Be Affecting Your Health:  Alcohol  Hearing loss  Pain   x Depression  Home Safety  Sexual Health  x Diabetes  Lack of physical activity  Stress  x Difficulty with daily activities  Loneliness  Tiredness   Drug use  Medicines  Tobacco use  x Falls  Motor Vehicle Safety  Weight   Food choices  Oral Health  Other    YOUR PERSONALIZED HEALTH PLAN : 1. Schedule your next subsequent Medicare Wellness visit in one year 2. Attend all of your regular appointments to address your medical issues 3. Complete the preventative screenings and services   Annual Wellness Visit   Medicare Covered Preventative Screenings and [place] Men and Women Who How Often Need? Date of Last Service Action  Abdominal Aortic Aneurysm Adults with AAA risk factors Once      Alcohol Misuse and Counseling All Adults Screening once a year if no alcohol misuse. Counseling up to 4 face to face sessions.     Bone Density Measurement  Adults at risk for osteoporosis Once every 2 yrs      Lipid Panel Z13.6 All adults without CV disease Once every 5 yrs       Colorectal Cancer   Stool sample or  Colonoscopy All adults 58 and older   Once every year  Every 10 years x       Depression All Adults Once a year x Today   Diabetes Screening Blood glucose, post glucose load, or GTT Z13.1  All adults at risk  Pre-diabetics  Once per year  Twice per year x     Diabetes  Self-Management Training All adults Diabetics 10 hrs first year; 2 hours subsequent years. Requires Copay     Glaucoma  Diabetics  Family history of glaucoma  African Americans 70 yrs +  Hispanic Americans 37 yrs + Annually - requires coppay      Hepatitis C Z72.89 or F19.20  High Risk for HCV  Born between 1945 and 1965  Annually  Once      HIV Z11.4 All adults based on risk  Annually btw ages 58 & 46 regardless of risk  Annually > 65 yrs if at increased risk      Lung Cancer Screening  Asymptomatic adults aged 11-77 with 30 pack yr history and current smoker OR quit within the last 15 yrs Annually Must have counseling and shared decision making documentation before first screen      Medical Nutrition Therapy Adults with   Diabetes  Renal disease  Kidney transplant within past 3 yrs 3 hours first year; 2 hours subsequent years     Obesity and Counseling All adults Screening once a year Counseling if BMI 30 or higher  Today   Tobacco Use Counseling Adults who use tobacco  Up to 8 visits in one year x    Vaccines Z23  Hepatitis B  Influenza   Pneumonia  Adults   Once  Once every flu season  Two different vaccines separated by one year     Next Annual Wellness Visit People with Medicare Every year x Today     Services & Screenings Women Who How Often Need  Date of Last Service Action  Mammogram  Z12.31 Women over 33 One baseline ages 89-39. Annually ager 40 yrs+      Pap tests All women Annually if high risk. Every 2 yrs for normal risk women  Screening for cervical cancer with   Pap (Z01.419 nl or Z01.411abnl) &  HPV Z11.51 Women aged 31 to 73 Once every 5 yrs     Screening pelvic and breast exams All women Annually if high risk. Every 2 yrs for normal risk women     Sexually Transmitted Diseases  Chlamydia  Gonorrhea  Syphilis All at risk adults Annually for non pregnant females at increased risk         [place] Men Who How Ofter Need  Date of Last Service Action  Prostate Cancer - DRE & PSA Men over 50 Annually.  DRE might require a copay.        Sexually Transmitted Diseases  Syphilis All at risk adults Annually for men at increased risk      Health Maintenance List Health Maintenance  Topic Date Due  . TETANUS/TDAP  09/05/2019  . COVID-19 Vaccine (3 - Booster for Pfizer series) 02/19/2020  . HEMOGLOBIN A1C  09/18/2020  . LIPID PANEL  10/09/2020  . FOOT EXAM  10/13/2020  . PNA vac Low Risk Adult (2 of 2 -  PPSV23) 10/18/2020  . COLONOSCOPY (Pts 45-28yrs Insurance coverage will need to be confirmed)  12/19/2020  . OPHTHALMOLOGY EXAM  03/28/2021  . MAMMOGRAM  04/09/2022  . INFLUENZA VACCINE  Completed  . DEXA SCAN  Completed  . Hepatitis C Screening  Completed  . HPV VACCINES  Aged Out

## 2020-08-27 ENCOUNTER — Ambulatory Visit (INDEPENDENT_AMBULATORY_CARE_PROVIDER_SITE_OTHER): Payer: Medicare Other | Admitting: Family

## 2020-08-27 ENCOUNTER — Other Ambulatory Visit: Payer: Self-pay

## 2020-08-27 ENCOUNTER — Encounter: Payer: Self-pay | Admitting: Family

## 2020-08-27 VITALS — BP 120/76 | HR 84 | Temp 98.0°F | Resp 18 | Ht 66.0 in | Wt 162.8 lb

## 2020-08-27 DIAGNOSIS — L509 Urticaria, unspecified: Secondary | ICD-10-CM

## 2020-08-27 DIAGNOSIS — T50905D Adverse effect of unspecified drugs, medicaments and biological substances, subsequent encounter: Secondary | ICD-10-CM

## 2020-08-27 DIAGNOSIS — J45909 Unspecified asthma, uncomplicated: Secondary | ICD-10-CM

## 2020-08-27 MED ORDER — FLUTICASONE-SALMETEROL 500-50 MCG/DOSE IN AEPB: 1.0000 | INHALATION_SPRAY | Freq: Two times a day (BID) | RESPIRATORY_TRACT | 3 refills | Status: DC

## 2020-08-27 MED ORDER — ALBUTEROL SULFATE HFA 108 (90 BASE) MCG/ACT IN AERS: 1.0000 | INHALATION_SPRAY | RESPIRATORY_TRACT | 0 refills | Status: DC | PRN

## 2020-08-27 NOTE — Patient Instructions (Addendum)
Drug reaction Your skin testing today to NovoLog and Lantus insulin was negative Continue to avoid all insulin Since she did not bring your glucometer with you today, schedule an appointment for drug challenge to Lantus next Monday at 1:30 PM. Remember to remain off all antihistamines 3 days prior. We will do a drug challenge to Novolog in 3 weeks. You will need to bring your glucometer with you these days in order for [place] to complete the challenge.   Not well controlled asthma Stop Advair 250/50 mcg Start Advair 500/51 puff twice a day to help prevent cough and wheeze May use albuterol 2 puffs every 4 hours as needed for cough, wheeze, tightness in chest, or shortness of breath.  Also may use albuterol 2 puffs 5 to 15 minutes prior to exercise Decrease smoking  Please let us know if this treatment plan is not working well for you Schedule an appointment for drug challenge to Lantus and NovoLog

## 2020-08-27 NOTE — Addendum Note (Signed)
Addended by: Althea Charon on: 08/27/2020 04:05 PM   Modules accepted: Orders

## 2020-08-27 NOTE — Progress Notes (Signed)
[place] [address] Dept: [phone]  FOLLOW UP NOTE  Patient ID: Krista Sosa, female    DOB: May 31, 1950  Age: 70 y.o. MRN: [id number] Date of Office Visit: 08/27/2020  Assessment  Chief Complaint: Allergy Testing (Insulin skin testing ), Asthma (Wheezing at night ), and Allergic Reaction (Pollen causes flares - sneezing,coughing, and headaches/sinus pressure )  HPI Krista Sosa is a 70 year old female who presents today for drug testing to NovoLog and Lantus insulin.  She was last seen on August 06, 2020 by Dr. Maudie Mercury for drug reaction, not well controlled asthma without complication, and adverse food reaction.  She reports that while she was taking insulin that she had itching and hives.  She is not having any itching or hives today.  She reports occasional dry cough and wheezing at night but otherwise denies any other cardiorespiratory, gastrointestinal, and cutaneous symptoms.  Informed consent signed and questions answered.  She did not bring her glucometer with her today as was requested at her last office visit.   Drug Allergies:  Allergies  Allergen Reactions  . Oxycodone Itching    Review of Systems: Review of Systems  Constitutional: Negative for chills and fever.  HENT:       Reports nasal congestion and denies rhinorrhea and postnasal drip  Eyes:        reports itchy watery eyes  Respiratory: Positive for cough. Negative for shortness of breath and wheezing.        Reports occasional dry cough denies any wheezing or shortness of breath right now  Cardiovascular: Negative for chest pain and palpitations.  Gastrointestinal: Negative for diarrhea, nausea and vomiting.  Genitourinary: Negative for dysuria.  Skin: Negative for itching and rash.  Neurological: Positive for headaches.    Physical Exam: BP 120/70   Pulse 88   Temp 98 F (36.7 C)   Ht 5\' 6"  (1.676 m)   Wt 162 lb 12.8 oz (73.8 kg)   LMP 08/27/2007   SpO2 96%   BMI  26.28 kg/m    Physical Exam Constitutional:      Appearance: Normal appearance.  HENT:     Head: Normocephalic and atraumatic.     Comments: Pharynx normal, eyes normal, ears normal, nose mildly edematous and slightly erythematous with white drainage noted    Right Ear: Tympanic membrane, ear canal and external ear normal.     Left Ear: Tympanic membrane, ear canal and external ear normal.     Mouth/Throat:     Mouth: Mucous membranes are moist.     Pharynx: Oropharynx is clear.  Eyes:     Conjunctiva/sclera: Conjunctivae normal.  Cardiovascular:     Rate and Rhythm: Regular rhythm.     Heart sounds: Normal heart sounds.  Pulmonary:     Effort: Pulmonary effort is normal.     Breath sounds: Normal breath sounds.     Comments: Lungs clear to auscultation Musculoskeletal:     Cervical back: Neck supple.  Skin:    General: Skin is warm.     Comments: No hives or urticarial lesions noted  Neurological:     Mental Status: She is alert and oriented to person, place, and time.  Psychiatric:        Mood and Affect: Mood normal.        Behavior: Behavior normal.        Thought Content: Thought content normal.        Judgment: Judgment normal.  Diagnostics: Percutaneous skin testing to NovoLog and Lantus were negative with a good histamine response.  1:100 and 1:10 NovoLog and Lantus intradermal's were negative.  Assessment and Plan: 1. Adverse effect of drug, subsequent encounter   2. Not well controlled asthma without complication     Meds ordered this encounter  Medications  . albuterol (VENTOLIN HFA) 108 (90 Base) MCG/ACT inhaler    Sig: Inhale 1-2 puffs into the lungs every 4 (four) hours as needed for wheezing or shortness of breath.    Dispense:  1 each    Refill:  0    Patient Instructions  Drug reaction Your skin testing today to NovoLog and Lantus insulin was negative Continue to avoid all insulin Since she did not bring your glucometer with you today,  schedule an appointment for drug challenge to Lantus next Monday at 1:30 PM. Remember to remain off all antihistamines 3 days prior. We will do a drug challenge to Novolog in 3 weeks. You will need to bring your glucometer with you these days in order for [place] to complete the challenge.   Not well controlled asthma Stop Advair 250/50 mcg Start Advair 500/51 puff twice a day to help prevent cough and wheeze May use albuterol 2 puffs every 4 hours as needed for cough, wheeze, tightness in chest, or shortness of breath.  Also may use albuterol 2 puffs 5 to 15 minutes prior to exercise Decrease smoking  Please let us know if this treatment plan is not working well for you Schedule an appointment for drug challenge to Lantus and NovoLog      Return in about 1 week (around 09/03/2020) for drug challenge lantus, then Novolog drug challenge 2 weeks later.    Thank you for the opportunity to care for this patient.  Please do not hesitate to contact me with questions.  Althea Charon, FNP [facility] [place] [facility] [place]

## 2020-08-30 ENCOUNTER — Ambulatory Visit: Payer: Medicare HMO | Admitting: Family Medicine

## 2020-09-03 ENCOUNTER — Encounter: Payer: Self-pay | Admitting: Family

## 2020-09-03 ENCOUNTER — Telehealth: Payer: Self-pay

## 2020-09-03 ENCOUNTER — Other Ambulatory Visit: Payer: Self-pay

## 2020-09-03 ENCOUNTER — Ambulatory Visit (INDEPENDENT_AMBULATORY_CARE_PROVIDER_SITE_OTHER): Payer: Medicare Other | Admitting: Family

## 2020-09-03 VITALS — BP 124/80 | HR 88 | Temp 98.2°F | Resp 18 | Ht 66.0 in | Wt 161.6 lb

## 2020-09-03 DIAGNOSIS — T50905D Adverse effect of unspecified drugs, medicaments and biological substances, subsequent encounter: Secondary | ICD-10-CM

## 2020-09-03 NOTE — Patient Instructions (Addendum)
Krista Sosa tolerated the Lantus Solostar insulin challenge today without any adverse signs or symptoms of an allergic reaction.  Do not give any insulin products for the next 24 hours. -Monitor for allergic symptoms such as rash, wheezing, diarrhea, swelling, and vomiting for the next 24 hours.  If severe symptoms occur seek medical attention and call 911.  For less severe symptoms treat with Benadryl 4 teaspoonfuls every 6 hours and call the clinic. Continue to avoid NovoLog insulin.  Make sure that you are scheduled for a NovoLog insulin challenge in 2 weeks.  Remember to remain off all antihistamines 3 days prior and to bring your glucometer with you on the day of this appointment. -Starting tomorrow start Lantus Solostar 15 units once a day. Call your primary care physician to schedule an appointment and for further instructions. Also, call your pharmacy/primary care physician about getting needles for your Lantus Solostar.  Please let us know if this treatment plan is not working well for you Schedule a follow-up appointment in 2 weeks for Novolog challenge

## 2020-09-03 NOTE — Telephone Encounter (Signed)
Thank you :)

## 2020-09-03 NOTE — Telephone Encounter (Signed)
Called patient and left a message to remind her to bring her glucose meter to complete her insulin challenge today at 1:30.

## 2020-09-03 NOTE — Progress Notes (Signed)
[place] [address] Dept: [phone]  FOLLOW UP NOTE  Patient ID: Krista Sosa, female    DOB: 06/06/1950  Age: 70 y.o. MRN: [id number] Date of Office Visit: 09/03/2020  Assessment  Chief Complaint: Food/Drug Challenge (Insulin challenge )  HPI Krista Sosa is a 70 year old female who presents today for Lantus Solostar drug challenge.  She was last seen on August 27, 2020 by Althea Charon for adverse effect of drug and not well controlled asthma.  At that office visit she tolerated the percutaneous skin testing and intradermal testing to NovoLog and Lantus insulin.  She reports that she is in good health today.  She denies any cutaneous and gastrointestinal symptoms.  She does mention that she has an occasional dry cough and some shortness of breath, but her breathing has been better since changing to Advair 500/50 taking 1 puff twice a day.  She continues to smoke approximately half a pack a day. Informed consent signed.   Drug Allergies:  Allergies  Allergen Reactions  . Oxycodone Itching    Review of Systems: Review of Systems  Constitutional: Negative for chills and fever.  HENT:       Reports postnasal drip and denies rhinorrhea and nasal congestion  Eyes:       Denies itchy watery eyes  Respiratory: Positive for cough and shortness of breath. Negative for wheezing.        Reports occasional dry cough and shortness of breath.  Reports breathing is better since starting Advair 500/50-1 puff twice a day  Cardiovascular: Negative for chest pain and palpitations.  Gastrointestinal: Positive for heartburn.       Reports occasional heartburn, but is not have any symptoms today  Genitourinary: Negative for dysuria.  Skin: Negative for itching and rash.  Neurological: Negative for headaches.     Physical Exam: BP 124/80   Pulse 88   Temp 98.2 F (36.8 C)   Resp 18   Ht 5\' 6"  (1.676 m)   Wt 161 lb 9.6 oz (73.3 kg)   LMP 08/27/2007    SpO2 97%   BMI 26.08 kg/m    Physical Exam Constitutional:      Appearance: Normal appearance.  HENT:     Head: Normocephalic and atraumatic.     Comments: Pharynx normal, eyes normal, ears normal, nose: Bilateral lower turbinate mildly edematous and slightly erythematous with clear drainage noted.    Right Ear: Tympanic membrane, ear canal and external ear normal.     Left Ear: Tympanic membrane, ear canal and external ear normal.     Mouth/Throat:     Mouth: Mucous membranes are moist.     Pharynx: Oropharynx is clear.  Eyes:     Conjunctiva/sclera: Conjunctivae normal.  Cardiovascular:     Rate and Rhythm: Regular rhythm.     Heart sounds: Normal heart sounds.  Pulmonary:     Effort: Pulmonary effort is normal.     Breath sounds: Normal breath sounds.     Comments: Lungs clear to auscultation Musculoskeletal:     Cervical back: Neck supple.  Skin:    General: Skin is warm.     Comments: No rashes or urticarial lesions noted  Neurological:     Mental Status: She is alert and oriented to person, place, and time.  Psychiatric:        Mood and Affect: Mood normal.        Behavior: Behavior normal.  Thought Content: Thought content normal.        Judgment: Judgment normal.     Diagnostics:    Oral Challenge - 09/03/20 1300    Challenge Food/Drug insulin challenge    Food/Drug provided by patient    BP 124/80    Pulse 88    Respirations 18    Lungs 97%    Skin clear    Mouth clear    Comments 341 Pre Blood sugar Check    Time 1353    Dose 5 units of lantus   lot [id number] / EXP 12/23/2021   Lungs clear    Skin clear    Mouth clear    Time 1412    Dose 10 units of lantus   LOT [id number] / EXP 12/23/2021   Lungs clear    Skin clear    Mouth clear    Comments Blood Sugar 284    Time 1514    Dose final vitals    BP 122/76    Pulse 71    Respirations 18    Lungs 99%    Skin clear    Mouth clear    Time 1516    Dose final Blood Sugar    Comments 220              Assessment and Plan: 1. Adverse effect of drug, subsequent encounter     No orders of the defined types were placed in this encounter.   Patient Instructions  Maevis tolerated the Lantus Solostar insulin challenge today without any adverse signs or symptoms of an allergic reaction.  Do not give any insulin products for the next 24 hours. -Monitor for allergic symptoms such as rash, wheezing, diarrhea, swelling, and vomiting for the next 24 hours.  If severe symptoms occur seek medical attention and call 911.  For less severe symptoms treat with Benadryl 4 teaspoonfuls every 6 hours and call the clinic. Continue to avoid NovoLog insulin.  Make sure that you are scheduled for a NovoLog insulin challenge in 2 weeks.  Remember to remain off all antihistamines 3 days prior and to bring your glucometer with you on the day of this appointment. -Starting tomorrow start Lantus Solostar 15 units once a day. Call your primary care physician to schedule an appointment and for further instructions. Also, call your pharmacy/primary care physician about getting needles for your Lantus Solostar.  Please let us know if this treatment plan is not working well for you Schedule a follow-up appointment in 2 weeks for Novolog challenge    Return in about 2 weeks (around 09/17/2020) for drug challenge Novolog.    Thank you for the opportunity to care for this patient.  Please do not hesitate to contact me with questions.  Althea Charon, FNP [facility]ergy and [place] [facility] [place]

## 2020-09-04 ENCOUNTER — Ambulatory Visit: Payer: Medicare Other | Admitting: Internal Medicine

## 2020-09-05 ENCOUNTER — Other Ambulatory Visit: Payer: Self-pay

## 2020-09-05 ENCOUNTER — Telehealth: Payer: Self-pay | Admitting: *Deleted

## 2020-09-05 ENCOUNTER — Encounter: Payer: Self-pay | Admitting: Internal Medicine

## 2020-09-05 ENCOUNTER — Ambulatory Visit (INDEPENDENT_AMBULATORY_CARE_PROVIDER_SITE_OTHER): Payer: Medicare Other | Admitting: Internal Medicine

## 2020-09-05 DIAGNOSIS — Z0000 Encounter for general adult medical examination without abnormal findings: Secondary | ICD-10-CM | POA: Diagnosis not present

## 2020-09-05 DIAGNOSIS — Z86010 Personal history of colonic polyps: Secondary | ICD-10-CM

## 2020-09-05 DIAGNOSIS — T50905D Adverse effect of unspecified drugs, medicaments and biological substances, subsequent encounter: Secondary | ICD-10-CM

## 2020-09-05 DIAGNOSIS — Z720 Tobacco use: Secondary | ICD-10-CM | POA: Diagnosis not present

## 2020-09-05 DIAGNOSIS — Z860101 Personal history of adenomatous and serrated colon polyps: Secondary | ICD-10-CM

## 2020-09-05 DIAGNOSIS — F330 Major depressive disorder, recurrent, mild: Secondary | ICD-10-CM

## 2020-09-05 NOTE — Telephone Encounter (Signed)
When doing Med Rec during today's AWV, patient stated she was taking lantus 15 units BID. This is no longer on current med list. Discussed with Dr. Marva Panda:  According to the FNP notes from Allergy/Immunology on 4/11, she was instructed to take Lantus 15U once daily and continue to avoid novolog. She is supposed to have a Novolog challenge in 2 weeks. Do we know how her BGL have been? If she is having any hypoglycemic episodes, will have her follow the once daily regimen. However, if tolerating, she can continue with twice daily.  Call placed to patient. States BSs have been ranging [phone]. She will continue to take lantus 15 units BID. She only has one pen left. Requesting refill at [facility]. She will f/u with PCP on 09/26/2020 and referral has been made to Pharm D for help with med management.

## 2020-09-05 NOTE — Patient Instructions (Addendum)
Things That May Be Affecting Your Health:  Alcohol  Hearing loss  Pain   x Depression  Home Safety  Sexual Health  x Diabetes  Lack of physical activity  Stress   Difficulty with daily activities  Loneliness  Tiredness   Drug use x Medicines x Tobacco use   Falls  Motor Vehicle Safety  Weight   Food choices  Oral Health  Other    YOUR PERSONALIZED HEALTH PLAN : 1. Schedule your next subsequent Medicare Wellness visit in one year 2. Attend all of your regular appointments to address your medical issues 3. Complete the preventative screenings and services 4. Referral has been placed to Dr. Carlean Purl at [facility] for 5 year follow up colonoscopy. Due ~ 12/19/2020 5. Referral placed to Dr. Georgina Peer for help with your medications, and stopping smoking  6. Referral placed to Dr. Theodis Shove for counseling 7. You can also call 1-800-QUIT-NOW for help with stopping smoking   Annual Wellness Visit                       Medicare Covered Preventative Screenings and [place] Men and Women Who How Often Need? Date of Last Service Action  Abdominal Aortic Aneurysm Adults with AAA risk factors Once      Alcohol Misuse and Counseling All Adults Screening once a year if no alcohol misuse. Counseling up to 4 face to face sessions.     Bone Density Measurement  Adults at risk for osteoporosis Once every 2 yrs      Lipid Panel Z13.6 All adults without CV disease Once every 5 yrs       Colorectal Cancer   Stool sample or  Colonoscopy All adults 87 and older   Once every year  Every 10 years x     Due ~ 12/19/2020 Referral placed to Dr. Carlean Purl at [facility] Once a year x Today Referral to Dr. Theodis Shove  Diabetes Screening Blood glucose, post glucose load, or GTT Z13.1  All adults at risk  Pre-diabetics  Once per year  Twice per year x   At next office visit on 09/26/2020  Diabetes  Self-Management Training  All adults Diabetics 10 hrs first year; 2 hours subsequent years. Requires Copay     Glaucoma  Diabetics  Family history of glaucoma  African Americans 28 yrs +  Hispanic Americans 34 yrs + Annually - requires coppay      Hepatitis C Z72.89 or F19.20  High Risk for HCV  Born between 1945 and 1965  Annually  Once      HIV Z11.4 All adults based on risk  Annually btw ages 31 & 31 regardless of risk  Annually > 65 yrs if at increased risk      Lung Cancer Screening Asymptomatic adults aged 59-77 with 30 pack yr history and current smoker OR quit within the last 15 yrs Annually Must have counseling and shared decision making documentation before first screen      Medical Nutrition Therapy Adults with   Diabetes  Renal disease  Kidney transplant within past 3 yrs 3 hours first year; 2 hours subsequent years     Obesity and Counseling All adults Screening once a year Counseling if BMI 30 or higher  Today   Tobacco Use Counseling Adults who use tobacco  Up to 8 visits in one year x  1-800-QUIT-NOW  Vaccines Z23  Hepatitis B  Influenza  Pneumonia  Adults   Once  Once every flu season  Two different vaccines separated by one year   Yearly flu vaccine starting Sept 1  Next Annual Wellness Visit People with Medicare Every year x Today     Services & Screenings Women Who How Often Need  Date of Last Service Action  Mammogram  Z12.31 Women over 32 One baseline ages 65-39. Annually ager 40 yrs+      Pap tests All women Annually if high risk. Every 2 yrs for normal risk women      Screening for cervical cancer with   Pap (Z01.419 nl or Z01.411abnl) &  HPV Z11.51 Women aged 53 to 56 Once every 5 yrs     Screening pelvic and breast exams All women Annually if high risk. Every 2 yrs for normal risk women     Sexually Transmitted Diseases  Chlamydia  Gonorrhea  Syphilis All at risk adults Annually for non pregnant  females at increased risk         [place] Men Who How Ofter Need  Date of Last Service Action  Prostate Cancer - DRE & PSA Men over 50 Annually.  DRE might require a copay.        Sexually Transmitted Diseases  Syphilis All at risk adults Annually for men at increased risk        Diabetes Mellitus and [place] care is an important part of your health, especially when you have diabetes. Diabetes may cause you to have problems because of poor blood flow (circulation) to your feet and legs, which can cause your skin to:  Become thinner and drier.  Break more easily.  Heal more slowly.  Peel and crack. You may also have nerve damage (neuropathy) in your legs and feet, causing decreased feeling in them. This means that you may not notice minor injuries to your feet that could lead to more serious problems. Noticing and addressing any potential problems early is the best way to prevent future foot problems. How to care for your feet Foot hygiene  Wash your feet daily with warm water and mild soap. Do not use hot water. Then, pat your feet and the areas between your toes until they are completely dry. Do not soak your feet as this can dry your skin.  Trim your toenails straight across. Do not dig under them or around the cuticle. File the edges of your nails with an emery board or nail file.  Apply a moisturizing lotion or petroleum jelly to the skin on your feet and to dry, brittle toenails. Use lotion that does not contain alcohol and is unscented. Do not apply lotion between your toes.   Shoes and socks  Wear clean socks or stockings every day. Make sure they are not too tight. Do not wear knee-high stockings since they may decrease blood flow to your legs.  Wear shoes that fit properly and have enough cushioning. Always look in your shoes before you put them on to be sure there are no objects inside.  To break in new shoes, wear them for  just a few hours a day. This prevents injuries on your feet. Wounds, scrapes, corns, and calluses  Check your feet daily for blisters, cuts, bruises, sores, and redness. If you cannot see the bottom of your feet, use a mirror or ask someone for help.  Do not cut corns or calluses or try to remove them with medicine.  If you find  a minor scrape, cut, or break in the skin on your feet, keep it and the skin around it clean and dry. You may clean these areas with mild soap and water. Do not clean the area with peroxide, alcohol, or iodine.  If you have a wound, scrape, corn, or callus on your foot, look at it several times a day to make sure it is healing and not infected. Check for: ? Redness, swelling, or pain. ? Fluid or blood. ? Warmth. ? Pus or a bad smell.   General tips  Do not cross your legs. This may decrease blood flow to your feet.  Do not use heating pads or hot water bottles on your feet. They may burn your skin. If you have lost feeling in your feet or legs, you may not know this is happening until it is too late.  Protect your feet from hot and cold by wearing shoes, such as at the beach or on hot pavement.  Schedule a complete foot exam at least once a year (annually) or more often if you have foot problems. Report any cuts, sores, or bruises to your health care provider immediately. Where to find more information  American Diabetes Association: www.diabetes.org  Association of Diabetes Care & Education Specialists: www.diabeteseducator.org Contact a health care provider if:  You have a medical condition that increases your risk of infection and you have any cuts, sores, or bruises on your feet.  You have an injury that is not healing.  You have redness on your legs or feet.  You feel burning or tingling in your legs or feet.  You have pain or cramps in your legs and feet.  Your legs or feet are numb.  Your feet always feel cold.  You have pain around any  toenails. Get help right away if:  You have a wound, scrape, corn, or callus on your foot and: ? You have pain, swelling, or redness that gets worse. ? You have fluid or blood coming from the wound, scrape, corn, or callus. ? Your wound, scrape, corn, or callus feels warm to the touch. ? You have pus or a bad smell coming from the wound, scrape, corn, or callus. ? You have a fever. ? You have a red line going up your leg. Summary  Check your feet every day for blisters, cuts, bruises, sores, and redness.  Apply a moisturizing lotion or petroleum jelly to the skin on your feet and to dry, brittle toenails.  Wear shoes that fit properly and have enough cushioning.  If you have foot problems, report any cuts, sores, or bruises to your health care provider immediately.  Schedule a complete foot exam at least once a year (annually) or more often if you have foot problems. This information is not intended to replace advice given to you by your health care provider. Make sure you discuss any questions you have with your health care provider. Document Revised: 12/01/2019 Document Reviewed: 12/01/2019 Elsevier Patient Education  2021 [place] Prevention in the Home, Adult Falls can cause injuries and can happen to people of all ages. There are many things you can do to make your home safe and to help prevent falls. Ask for help when making these changes. What actions can I take to prevent falls? General Instructions  Use good lighting in all rooms. Replace any light bulbs that burn out.  Turn on the lights in dark areas. Use night-lights.  Keep items that you  use often in easy-to-reach places. Lower the shelves around your home if needed.  Set up your furniture so you have a clear path. Avoid moving your furniture around.  Do not have throw rugs or other things on the floor that can make you trip.  Avoid walking on wet floors.  If any of your floors are uneven, fix  them.  Add color or contrast paint or tape to clearly mark and help you see: ? Grab bars or handrails. ? First and last steps of staircases. ? Where the edge of each step is.  If you use a stepladder: ? Make sure that it is fully opened. Do not climb a closed stepladder. ? Make sure the sides of the stepladder are locked in place. ? Ask someone to hold the stepladder while you use it.  Know where your pets are when moving through your home. What can I do in the bathroom?  Keep the floor dry. Clean up any water on the floor right away.  Remove soap buildup in the tub or shower.  Use nonskid mats or decals on the floor of the tub or shower.  Attach bath mats securely with double-sided, nonslip rug tape.  If you need to sit down in the shower, use a plastic, nonslip stool.  Install grab bars by the toilet and in the tub and shower. Do not use towel bars as grab bars.      What can I do in the bedroom?  Make sure that you have a light by your bed that is easy to reach.  Do not use any sheets or blankets for your bed that hang to the floor.  Have a firm chair with side arms that you can use for support when you get dressed. What can I do in the kitchen?  Clean up any spills right away.  If you need to reach something above you, use a step stool with a grab bar.  Keep electrical cords out of the way.  Do not use floor polish or wax that makes floors slippery. What can I do with my stairs?  Do not leave any items on the stairs.  Make sure that you have a light switch at the top and the bottom of the stairs.  Make sure that there are handrails on both sides of the stairs. Fix handrails that are broken or loose.  Install nonslip stair treads on all your stairs.  Avoid having throw rugs at the top or bottom of the stairs.  Choose a carpet that does not hide the edge of the steps on the stairs.  Check carpeting to make sure that it is firmly attached to the stairs. Fix  carpet that is loose or worn. What can I do on the outside of my home?  Use bright outdoor lighting.  Fix the edges of walkways and driveways and fix any cracks.  Remove anything that might make you trip as you walk through a door, such as a raised step or threshold.  Trim any bushes or trees on paths to your home.  Check to see if handrails are loose or broken and that both sides of all steps have handrails.  Install guardrails along the edges of any raised decks and porches.  Clear paths of anything that can make you trip, such as tools or rocks.  Have leaves, snow, or ice cleared regularly.  Use sand or salt on paths during winter.  Clean up any spills in your garage right  away. This includes grease or oil spills. What other actions can I take?  Wear shoes that: ? Have a low heel. Do not wear high heels. ? Have rubber bottoms. ? Feel good on your feet and fit well. ? Are closed at the toe. Do not wear open-toe sandals.  Use tools that help you move around if needed. These include: ? Canes. ? Walkers. ? Scooters. ? Crutches.  Review your medicines with your doctor. Some medicines can make you feel dizzy. This can increase your chance of falling. Ask your doctor what else you can do to help prevent falls. Where to find more information  Centers for Disease Control and Prevention, STEADI: http://www.wolf.info/  National Institute on Aging: http://kim-miller.com/ Contact a doctor if:  You are afraid of falling at home.  You feel weak, drowsy, or dizzy at home.  You fall at home. Summary  There are many simple things that you can do to make your home safe and to help prevent falls.  Ways to make your home safe include removing things that can make you trip and installing grab bars in the bathroom.  Ask for help when making these changes in your home. This information is not intended to replace advice given to you by your health care provider. Make sure you discuss any questions  you have with your health care provider. Document Revised: 12/14/2019 Document Reviewed: 12/14/2019 Elsevier Patient Education  [place] Maintenance, Female Adopting a healthy lifestyle and getting preventive care are important in promoting health and wellness. Ask your health care provider about:  The right schedule for you to have regular tests and exams.  Things you can do on your own to prevent diseases and keep yourself healthy. What should I know about diet, weight, and exercise? Eat a healthy diet  Eat a diet that includes plenty of vegetables, fruits, low-fat dairy products, and lean protein.  Do not eat a lot of foods that are high in solid fats, added sugars, or sodium.   Maintain a healthy weight Body mass index (BMI) is used to identify weight problems. It estimates body fat based on height and weight. Your health care provider can help determine your BMI and help you achieve or maintain a healthy weight. Get regular exercise Get regular exercise. This is one of the most important things you can do for your health. Most adults should:  Exercise for at least 150 minutes each week. The exercise should increase your heart rate and make you sweat (moderate-intensity exercise).  Do strengthening exercises at least twice a week. This is in addition to the moderate-intensity exercise.  Spend less time sitting. Even light physical activity can be beneficial. Watch cholesterol and blood lipids Have your blood tested for lipids and cholesterol at 68 years of age, then have this test every 5 years. Have your cholesterol levels checked more often if:  Your lipid or cholesterol levels are high.  You are older than 68 years of age.  You are at high risk for heart disease. What should I know about cancer screening? Depending on your health history and family history, you may need to have cancer screening at various ages. This may include screening for:  Breast  cancer.  Cervical cancer.  Colorectal cancer.  Skin cancer.  Lung cancer. What should I know about heart disease, diabetes, and high blood pressure? Blood pressure and heart disease  High blood pressure causes heart disease and increases the risk of stroke. This  is more likely to develop in people who have high blood pressure readings, are of African descent, or are overweight.  Have your blood pressure checked: ? Every 3-5 years if you are 97-4 years of age. ? Every year if you are 43 years old or older. Diabetes Have regular diabetes screenings. This checks your fasting blood sugar level. Have the screening done:  Once every three years after age 37 if you are at a normal weight and have a low risk for diabetes.  More often and at a younger age if you are overweight or have a high risk for diabetes. What should I know about preventing infection? Hepatitis B If you have a higher risk for hepatitis B, you should be screened for this virus. Talk with your health care provider to find out if you are at risk for hepatitis B infection. Hepatitis C Testing is recommended for:  Everyone born from [birth year] through 1963.  Anyone with known risk factors for hepatitis C. Sexually transmitted infections (STIs)  Get screened for STIs, including gonorrhea and chlamydia, if: ? You are sexually active and are younger than 68 years of age. ? You are older than 68 years of age and your health care provider tells you that you are at risk for this type of infection. ? Your sexual activity has changed since you were last screened, and you are at increased risk for chlamydia or gonorrhea. Ask your health care provider if you are at risk.  Ask your health care provider about whether you are at high risk for HIV. Your health care provider may recommend a prescription medicine to help prevent HIV infection. If you choose to take medicine to prevent HIV, you should first get tested for HIV. You should  then be tested every 3 months for as long as you are taking the medicine. Pregnancy  If you are about to stop having your period (premenopausal) and you may become pregnant, seek counseling before you get pregnant.  Take 400 to 800 micrograms (mcg) of folic acid every day if you become pregnant.  Ask for birth control (contraception) if you want to prevent pregnancy. Osteoporosis and menopause Osteoporosis is a disease in which the bones lose minerals and strength with aging. This can result in bone fractures. If you are 28 years old or older, or if you are at risk for osteoporosis and fractures, ask your health care provider if you should:  Be screened for bone loss.  Take a calcium or vitamin D supplement to lower your risk of fractures.  Be given hormone replacement therapy (HRT) to treat symptoms of menopause. Follow these instructions at home: Lifestyle  Do not use any products that contain nicotine or tobacco, such as cigarettes, e-cigarettes, and chewing tobacco. If you need help quitting, ask your health care provider.  Do not use street drugs.  Do not share needles.  Ask your health care provider for help if you need support or information about quitting drugs. Alcohol use  Do not drink alcohol if: ? Your health care provider tells you not to drink. ? You are pregnant, may be pregnant, or are planning to become pregnant.  If you drink alcohol: ? Limit how much you use to 0-1 drink a day. ? Limit intake if you are breastfeeding.  Be aware of how much alcohol is in your drink. In the [place] one drink equals one 12 oz bottle of beer (355 mL), one 5 oz glass of wine (148 mL), or one  1 oz glass of hard liquor (44 mL). General instructions  Schedule regular health, dental, and eye exams.  Stay current with your vaccines.  Tell your health care provider if: ? You often feel depressed. ? You have ever been abused or do not feel safe at home. Summary  Adopting a  healthy lifestyle and getting preventive care are important in promoting health and wellness.  Follow your health care provider's instructions about healthy diet, exercising, and getting tested or screened for diseases.  Follow your health care provider's instructions on monitoring your cholesterol and blood pressure. This information is not intended to replace advice given to you by your health care provider. Make sure you discuss any questions you have with your health care provider. Document Revised: 05/05/2018 Document Reviewed: 05/05/2018 Elsevier Patient Education  2021 [employer].

## 2020-09-05 NOTE — Progress Notes (Signed)
I discussed the AWV findings with the RN who conducted the visit. I was present in the office suite and immediately available to provide assistance and direction throughout the time the service was provided.  Harvie Heck, MD Internal Medicine, PGY-2 09/05/20 5:01 PM

## 2020-09-05 NOTE — Progress Notes (Signed)
This AWV is being conducted by [place] only. The patient was located at home and I was located in [facility]. The patient's identity was confirmed using their DOB and current address. The patient or his/her legal guardian has consented to being evaluated through a telephone encounter and understands the associated risks (an examination cannot be done and the patient may need to come in for an appointment) / benefits (allows the patient to remain at home, decreasing exposure to coronavirus). I personally spent 33 minutes conducting the AWV.  Subjective:   Krista Sosa is a 70 y.o. female who presents for a Medicare Annual Wellness Visit.  The following items have been reviewed and updated today in the appropriate area in the EMR.   Health Risk Assessment  Height, weight, BMI, and BP Visual acuity if needed Depression screen Fall risk / safety level Advance directive discussion Medical and family history were reviewed and updated Updating list of other providers & suppliers Medication reconciliation, including over the counter medicines Cognitive screen Written screening schedule Risk Factor list Personalized health advice, risky behaviors, and treatment advice  Social History   Social History Narrative   Current Social History 09/05/2020       Patient lives alone in a ground floor apartment. There is one step down without handrails to the entrance the patient uses.       Patient's method of transportation is personal car.      The highest level of education was 11 th grade      The patient currently retired from [employer], and the [employer].      Identified important Relationships are "Aldona Bar and Mingo Amber"       Pets : Strang "[employer] / Fun: "Walking at the park"       Current Stressors: "I have no stress; I'm good"       Religious / Personal Beliefs: "Holiness"       L. Darriana Deboy, BSN, RN-BC         Objective:    Vitals: LMP  08/27/2007  Vitals are unable to obtained due to [id number] public health emergency  Activities of Daily Living In your present state of health, do you have any difficulty performing the following activities: 09/05/2020 07/02/2020  Hearing? N N  Vision? Y N  Comment - -  Difficulty concentrating or making decisions? N N  Comment - -  Walking or climbing stairs? Y N  Comment steps -  Dressing or bathing? N N  Doing errands, shopping? N N  Some recent data might be hidden    Goals Goals    . Blood Pressure < 140/90    . Exercise 3x per week (30 min per time)     Walking    . HEMOGLOBIN A1C < 7.0    . LDL CALC < 100    . Quit smoking / using tobacco    . Weight < 150 lb (68 kg)      Information provided on [place] Quitline and smoking cessation classes at [facility]  Fall Risk Fall Risk  09/05/2020 07/02/2020 06/20/2020 05/09/2020 03/26/2020  Falls in the past year? 0 0 0 0 0  Number falls in past yr: - - - - -  Injury with Fall? - - - - -  Risk for fall due to : No Fall Risks No Fall Risks - No Fall Risks -  Risk for fall due to: Comment - - - - -  Follow up Falls prevention discussed;Education provided - - Falls prevention discussed -   CDC Handout on Fall Prevention and Handout on Home Exercise Program, Access codes [email or web address] and [email or web address] mailed to patient with exercise band.   Depression Screen PHQ 2/9 Scores 09/05/2020 07/16/2020 07/02/2020 06/20/2020  PHQ - 2 Score 0 1 0 0  PHQ- 9 Score 8 7 0 4  Exception Documentation - - - -    Referral to [facility]  Cognitive Testing Six-Item Cognitive Screener   "I would like to ask you some questions that ask you to use your memory. I am going to name three objects. Please wait until I say all three words, then repeat them. Remember what they are  because I am going to ask you to name them again in a few minutes. Please repeat these words for me: APPLE--TABLE--PENNY." (Interviewer may repeat names 3 times if necessary but repetition not scored.)  Did  patient correctly repeat all three words? Yes - may proceed with screen  What year is this? Correct What month is this? Correct What day of the week is this? Correct  What were the three objects I asked you to remember? . Apple Correct . Table Correct . Kieth Brightly unable to state  Score one point for each incorrect answer.  A score of 2 or more points warrants additional investigation.  Patient's score 1   Assessment and Plan:     PHQ-9 = 8  Referral placed to [facility] for this and smoking cessation. Also, given info on [place] Quitline  Referral to Dr. Georgina Peer for med management as patient is struggling with taking meds correctly.  Referral to GI for 5 year f/u colonoscopy  F/u appt made with PCP for 5/4  During the course of the visit the patient was educated and counseled about appropriate screening and preventive services as documented in the assessment and plan.  The printed AVS was given to the patient and included an updated screening schedule, a list of risk factors, and personalized health advice.        Velora Heckler, RN  09/05/2020

## 2020-09-06 MED ORDER — INSULIN GLARGINE 100 UNIT/ML ~~LOC~~ SOLN: 15.0000 [IU] | Freq: Two times a day (BID) | SUBCUTANEOUS | 3 refills | Status: DC

## 2020-09-06 NOTE — Addendum Note (Signed)
Addended by: Harvie Heck on: 09/06/2020 07:32 AM   Modules accepted: Orders

## 2020-09-10 ENCOUNTER — Encounter: Payer: Self-pay | Admitting: Internal Medicine

## 2020-09-10 NOTE — Progress Notes (Signed)
Internal Medicine Clinic Attending  Case discussed with Dr.  Marva Panda  at the time of the visit.  We reviewed the AWV findings.  I agree with the assessment, diagnosis, and plan of care documented in the AWV note.

## 2020-09-11 ENCOUNTER — Other Ambulatory Visit (HOSPITAL_COMMUNITY): Payer: Medicare Other

## 2020-09-11 ENCOUNTER — Encounter (HOSPITAL_BASED_OUTPATIENT_CLINIC_OR_DEPARTMENT_OTHER): Payer: Self-pay | Admitting: Surgery

## 2020-09-11 ENCOUNTER — Other Ambulatory Visit: Payer: Self-pay

## 2020-09-13 ENCOUNTER — Encounter: Payer: Medicare Other | Admitting: Pharmacist

## 2020-09-17 ENCOUNTER — Encounter: Payer: Medicare Other | Admitting: Family

## 2020-09-17 ENCOUNTER — Other Ambulatory Visit (HOSPITAL_COMMUNITY): Payer: Medicare Other

## 2020-09-17 ENCOUNTER — Encounter (HOSPITAL_BASED_OUTPATIENT_CLINIC_OR_DEPARTMENT_OTHER)
Admission: RE | Admit: 2020-09-17 | Discharge: 2020-09-17 | Disposition: A | Discharge status: Home / Self Care | Payer: Medicare Other | Source: Ambulatory Visit | Attending: Surgery | Admitting: Surgery

## 2020-09-17 DIAGNOSIS — Z833 Family history of diabetes mellitus: Secondary | ICD-10-CM | POA: Diagnosis not present

## 2020-09-17 DIAGNOSIS — E119 Type 2 diabetes mellitus without complications: Secondary | ICD-10-CM | POA: Diagnosis not present

## 2020-09-17 DIAGNOSIS — E7800 Pure hypercholesterolemia, unspecified: Secondary | ICD-10-CM | POA: Diagnosis not present

## 2020-09-17 DIAGNOSIS — Z01818 Encounter for other preprocedural examination: Secondary | ICD-10-CM | POA: Diagnosis not present

## 2020-09-17 DIAGNOSIS — L723 Sebaceous cyst: Secondary | ICD-10-CM | POA: Diagnosis not present

## 2020-09-17 DIAGNOSIS — I10 Essential (primary) hypertension: Secondary | ICD-10-CM | POA: Diagnosis not present

## 2020-09-17 DIAGNOSIS — Z20822 Contact with and (suspected) exposure to covid-19: Secondary | ICD-10-CM | POA: Diagnosis not present

## 2020-09-17 DIAGNOSIS — Z885 Allergy status to narcotic agent status: Secondary | ICD-10-CM | POA: Diagnosis not present

## 2020-09-17 DIAGNOSIS — Z8249 Family history of ischemic heart disease and other diseases of the circulatory system: Secondary | ICD-10-CM | POA: Diagnosis not present

## 2020-09-17 LAB — BASIC METABOLIC PANEL
Anion gap: 10 (ref 5–15)
BUN: 13 mg/dL (ref 8–23)
CO2: 22 mmol/L (ref 22–32)
Calcium: 8.8 mg/dL — ABNORMAL LOW (ref 8.9–10.3)
Chloride: 105 mmol/L (ref 98–111)
Creatinine, Ser: 0.73 mg/dL (ref 0.44–1.00)
GFR, Estimated: >60 mL/min (ref >60)
Glucose, Bld: 128 mg/dL — ABNORMAL HIGH (ref 70–99)
Potassium: 3.6 mmol/L (ref 3.5–5.1)
Sodium: 137 mmol/L (ref 135–145)

## 2020-09-17 MED ORDER — ENSURE PRE-SURGERY PO LIQD: 296.0000 mL | Freq: Once | ORAL | Status: DC

## 2020-09-17 NOTE — Progress Notes (Signed)

## 2020-09-18 ENCOUNTER — Encounter: Payer: Self-pay | Admitting: *Deleted

## 2020-09-18 NOTE — Progress Notes (Signed)
Pt messaged reminder to go for Covid testing today.

## 2020-09-18 NOTE — H&P (Signed)
Krista Sosa Appointment: 08/20/2020 11:30 AM Location: [facility] Patient #: [id number] DOB: 02-13-1951 Undefined / Language: Cleophus Molt / Race: Black or African American Female   History of Present Illness (Treyvin Glidden A. Ninfa Linden MD; 08/20/2020 11:48 AM) The patient is a 70 year old female who presents with a complaint of Skin problems.  Chief complaint: Chronically infected upper back cyst  This is a pleasant 70 year old female referred here for a chronically infected cyst on her upper back. The cyst has been present for some time but over the last 3 months has become intermittently infected and required an incision and drainage. She is a diabetic. She has been on antibiotics several times. Her discomfort is currently improving after last drainage procedure. She is otherwise without complaints.   Past Surgical History Andreas Blower, [place]; 08/20/2020 11:42 AM) Foot Surgery  Bilateral.  Diagnostic Studies History Andreas Blower, CMA; 08/20/2020 11:42 AM) Colonoscopy  1-5 years ago Mammogram  within last year Pap Smear  1-5 years ago  Allergies Andreas Blower, CMA; 08/20/2020 11:43 AM) oxyCODONE HCl *ANALGESICS - OPIOID*  Itching.  Medication History (Armen Ferguson, CMA; 08/20/2020 11:45 AM) Ventolin HFA (108 (90 Base)MCG/ACT Aerosol Soln, Inhalation) Active. Atorvastatin Calcium (40MG  Tablet, Oral) Active. Jardiance (25MG  Tablet, Oral) Active. Advair Diskus (250-50MCG/DOSE Aero Pow Br Act, Inhalation) Active. glipiZIDE ER (5MG  Tablet ER 24HR, Oral) Active. Olmesartan Medoxomil-HCTZ (20-12.5MG  Tablet, Oral) Active. Janumet (50-1000MG  Tablet, Oral) Active. Kenalog (0.025% Ointment, External) Active. Medications Reconciled  Social History Andreas Blower, CMA; 08/20/2020 11:42 AM) Alcohol use  Occasional alcohol use. Caffeine use  Coffee.  Family History Andreas Blower, [place]; 08/20/2020 11:42 AM) Alcohol Abuse  Brother, Father. Arthritis   Sister. Cerebrovascular Accident  Brother. Colon Cancer  Mother. Diabetes Mellitus  Father, Mother. Hypertension  Brother, Sister. Kidney Disease  Sister. Melanoma  Father.  Pregnancy / Birth History Andreas Blower, [place]; 08/20/2020 11:42 AM) Age at menarche  59 years. Age of menopause  107-55 Maternal age  6-20 Para  29  Other Problems Andreas Blower, [place]; 08/20/2020 11:42 AM) Arthritis  Asthma  Chest pain  Diabetes Mellitus  High blood pressure  Hypercholesterolemia     Review of Systems (Armen Ferguson CMA; 08/20/2020 11:42 AM) General Present- Fever and Weight Loss. Not Present- Appetite Loss, Chills, Fatigue, Night Sweats and Weight Gain. Skin Present- Dryness and Rash. Not Present- Change in Wart/Mole, Hives, Jaundice, New Lesions, Non-Healing Wounds and Ulcer. HEENT Present- Earache and Sinus Pain. Not Present- Hearing Loss, Hoarseness, Nose Bleed, Oral Ulcers, Ringing in the Ears, Seasonal Allergies, Sore Throat, Visual Disturbances, Wears glasses/contact lenses and Yellow Eyes. Respiratory Present- Wheezing. Not Present- Bloody sputum, Chronic Cough, Difficulty Breathing and Snoring. Cardiovascular Present- Chest Pain and Leg Cramps. Not Present- Difficulty Breathing Lying Down, Palpitations, Rapid Heart Rate, Shortness of Breath and Swelling of Extremities. Gastrointestinal Present- Excessive gas and Nausea. Not Present- Abdominal Pain, Bloating, Bloody Stool, Change in Bowel Habits, Chronic diarrhea, Constipation, Difficulty Swallowing, Gets full quickly at meals, Hemorrhoids, Indigestion, Rectal Pain and Vomiting. Musculoskeletal Present- Back Pain and Muscle Pain. Not Present- Joint Pain, Joint Stiffness, Muscle Weakness and Swelling of Extremities. Neurological Present- Headaches and Weakness. Not Present- Decreased Memory, Fainting, Numbness, Seizures, Tingling, Tremor and Trouble walking. Psychiatric Present- Change in Sleep Pattern. Not Present- Anxiety,  Bipolar, Depression, Fearful and Frequent crying.  Vitals (Armen Ferguson CMA; 08/20/2020 11:43 AM) 08/20/2020 11:43 AM Weight: 160.25 lb Height: 66in Body Surface Area: 1.82 m Body Mass Index: 25.86 kg/m  Temp.: 97.85F  Pulse: 118 (Regular)  P.OX: 98% (Room air) BP: 138/74(Sitting, Left Arm, Standard)       Physical Exam (Cory Kitt A. Ninfa Linden MD; 08/20/2020 11:48 AM) The physical exam findings are as follows: Note: She appears well exam.  Lungs clear CV RRR Abdomen soft, Non tender Psych normal  There is a chronically infected sebaceous cyst on her upper back. There are multiple open small holes but no purulence. There is chronic induration. It is mildly tender. There are no other similar cyst on her back    Assessment & Plan (Chawn Spraggins A. Ninfa Linden MD; 08/20/2020 11:49 AM) INFECTED SEBACEOUS CYST (L72.3) Impression: I reviewed her notes in the electronic medical records.  She has a 2 cm chronically infected sebaceous cyst on her upper back. Surgical excision of this area is recommended given her recent infections and diabetes and to prevent further problems. I discussed the surgical procedure with her in detail. I discussed the risks which includes but is not limited to bleeding, infection, recurrence, the need for other procedures, having a chronic open wound, etc. She understands and wishes to proceed with surgery which will be scheduled.

## 2020-09-19 ENCOUNTER — Ambulatory Visit (HOSPITAL_BASED_OUTPATIENT_CLINIC_OR_DEPARTMENT_OTHER): Payer: Medicare Other | Admitting: Certified Registered"

## 2020-09-19 ENCOUNTER — Encounter (HOSPITAL_BASED_OUTPATIENT_CLINIC_OR_DEPARTMENT_OTHER): Payer: Self-pay | Admitting: Surgery

## 2020-09-19 ENCOUNTER — Other Ambulatory Visit: Payer: Self-pay

## 2020-09-19 ENCOUNTER — Ambulatory Visit (HOSPITAL_BASED_OUTPATIENT_CLINIC_OR_DEPARTMENT_OTHER)
Admission: RE | Admit: 2020-09-19 | Discharge: 2020-09-19 | Disposition: A | Discharge status: Home / Self Care | Payer: Medicare Other | Attending: Surgery | Admitting: Surgery

## 2020-09-19 ENCOUNTER — Encounter (HOSPITAL_BASED_OUTPATIENT_CLINIC_OR_DEPARTMENT_OTHER)
Admission: RE | Disposition: A | Discharge status: Home / Self Care | Payer: Self-pay | Source: Home / Self Care | Attending: Surgery

## 2020-09-19 DIAGNOSIS — Z885 Allergy status to narcotic agent status: Secondary | ICD-10-CM | POA: Insufficient documentation

## 2020-09-19 DIAGNOSIS — I10 Essential (primary) hypertension: Secondary | ICD-10-CM | POA: Insufficient documentation

## 2020-09-19 DIAGNOSIS — L723 Sebaceous cyst: Secondary | ICD-10-CM | POA: Diagnosis not present

## 2020-09-19 DIAGNOSIS — E7800 Pure hypercholesterolemia, unspecified: Secondary | ICD-10-CM | POA: Diagnosis not present

## 2020-09-19 DIAGNOSIS — J4520 Mild intermittent asthma, uncomplicated: Secondary | ICD-10-CM | POA: Diagnosis not present

## 2020-09-19 DIAGNOSIS — Z833 Family history of diabetes mellitus: Secondary | ICD-10-CM | POA: Diagnosis not present

## 2020-09-19 DIAGNOSIS — M7138 Other bursal cyst, other site: Secondary | ICD-10-CM | POA: Diagnosis not present

## 2020-09-19 DIAGNOSIS — Z8249 Family history of ischemic heart disease and other diseases of the circulatory system: Secondary | ICD-10-CM | POA: Diagnosis not present

## 2020-09-19 DIAGNOSIS — E119 Type 2 diabetes mellitus without complications: Secondary | ICD-10-CM | POA: Diagnosis not present

## 2020-09-19 DIAGNOSIS — L905 Scar conditions and fibrosis of skin: Secondary | ICD-10-CM | POA: Diagnosis not present

## 2020-09-19 DIAGNOSIS — L089 Local infection of the skin and subcutaneous tissue, unspecified: Secondary | ICD-10-CM | POA: Diagnosis not present

## 2020-09-19 DIAGNOSIS — K219 Gastro-esophageal reflux disease without esophagitis: Secondary | ICD-10-CM | POA: Diagnosis not present

## 2020-09-19 HISTORY — PX: EXCISION OF KELOID: SHX6267

## 2020-09-19 HISTORY — DX: Nicotine dependence, unspecified, uncomplicated: F17.200

## 2020-09-19 LAB — GLUCOSE, CAPILLARY
Glucose-Capillary: 145 mg/dL — ABNORMAL HIGH (ref 70–99)
Glucose-Capillary: 156 mg/dL — ABNORMAL HIGH (ref 70–99)

## 2020-09-19 SURGERY — EXCISION, KELOID
Anesthesia: General | Site: Back

## 2020-09-19 MED ORDER — EPHEDRINE 5 MG/ML INJ
INTRAVENOUS | Status: AC
  Filled 2020-09-19: qty 10

## 2020-09-19 MED ORDER — CHLORHEXIDINE GLUCONATE CLOTH 2 % EX PADS: 6.0000 | MEDICATED_PAD | Freq: Once | CUTANEOUS | Status: DC

## 2020-09-19 MED ORDER — OXYCODONE HCL 5 MG PO TABS: 5.0000 mg | ORAL_TABLET | Freq: Once | ORAL | Status: DC | PRN

## 2020-09-19 MED ORDER — PHENYLEPHRINE HCL (PRESSORS) 10 MG/ML IV SOLN
INTRAVENOUS | Status: DC | PRN
  Administered 2020-09-19 (×2): 40 ug via INTRAVENOUS
  Administered 2020-09-19: 80 ug via INTRAVENOUS
  Administered 2020-09-19: 40 ug via INTRAVENOUS
  Administered 2020-09-19: 80 ug via INTRAVENOUS

## 2020-09-19 MED ORDER — FENTANYL CITRATE (PF) 100 MCG/2ML IJ SOLN
INTRAMUSCULAR | Status: DC | PRN
  Administered 2020-09-19: 25 ug via INTRAVENOUS

## 2020-09-19 MED ORDER — LIDOCAINE 2% (20 MG/ML) 5 ML SYRINGE
INTRAMUSCULAR | Status: AC
  Filled 2020-09-19: qty 5

## 2020-09-19 MED ORDER — AMISULPRIDE (ANTIEMETIC) 5 MG/2ML IV SOLN: 10.0000 mg | Freq: Once | INTRAVENOUS | Status: DC | PRN

## 2020-09-19 MED ORDER — PHENYLEPHRINE 40 MCG/ML (10ML) SYRINGE FOR IV PUSH (FOR BLOOD PRESSURE SUPPORT)
PREFILLED_SYRINGE | INTRAVENOUS | Status: AC
  Filled 2020-09-19: qty 10

## 2020-09-19 MED ORDER — PROPOFOL 10 MG/ML IV BOLUS
INTRAVENOUS | Status: AC
  Filled 2020-09-19: qty 20

## 2020-09-19 MED ORDER — MIDAZOLAM HCL 2 MG/2ML IJ SOLN
INTRAMUSCULAR | Status: AC
  Filled 2020-09-19: qty 2

## 2020-09-19 MED ORDER — FENTANYL CITRATE (PF) 100 MCG/2ML IJ SOLN: 25.0000 ug | INTRAMUSCULAR | Status: DC | PRN

## 2020-09-19 MED ORDER — CEFAZOLIN SODIUM-DEXTROSE 2-4 GM/100ML-% IV SOLN
2.0000 g | INTRAVENOUS | Status: AC
  Administered 2020-09-19: 2 g via INTRAVENOUS

## 2020-09-19 MED ORDER — ACETAMINOPHEN 500 MG PO TABS
ORAL_TABLET | ORAL | Status: AC
  Filled 2020-09-19: qty 2

## 2020-09-19 MED ORDER — LIDOCAINE HCL (CARDIAC) PF 100 MG/5ML IV SOSY
PREFILLED_SYRINGE | INTRAVENOUS | Status: DC | PRN
  Administered 2020-09-19: 60 mg via INTRAVENOUS

## 2020-09-19 MED ORDER — BUPIVACAINE-EPINEPHRINE 0.5% -1:200000 IJ SOLN
INTRAMUSCULAR | Status: DC | PRN
  Administered 2020-09-19: 15 mL

## 2020-09-19 MED ORDER — OXYCODONE HCL 5 MG/5ML PO SOLN: 5.0000 mg | Freq: Once | ORAL | Status: DC | PRN

## 2020-09-19 MED ORDER — ONDANSETRON HCL 4 MG/2ML IJ SOLN
INTRAMUSCULAR | Status: AC
  Filled 2020-09-19: qty 2

## 2020-09-19 MED ORDER — LIDOCAINE HCL (PF) 1 % IJ SOLN
INTRAMUSCULAR | Status: AC
  Filled 2020-09-19: qty 30

## 2020-09-19 MED ORDER — MIDAZOLAM HCL 5 MG/5ML IJ SOLN
INTRAMUSCULAR | Status: DC | PRN
  Administered 2020-09-19: 2 mg via INTRAVENOUS

## 2020-09-19 MED ORDER — DEXAMETHASONE SODIUM PHOSPHATE 10 MG/ML IJ SOLN
INTRAMUSCULAR | Status: AC
  Filled 2020-09-19: qty 1

## 2020-09-19 MED ORDER — BUPIVACAINE-EPINEPHRINE (PF) 0.5% -1:200000 IJ SOLN
INTRAMUSCULAR | Status: AC
  Filled 2020-09-19: qty 30

## 2020-09-19 MED ORDER — TRAMADOL HCL 50 MG PO TABS: 50.0000 mg | ORAL_TABLET | Freq: Four times a day (QID) | ORAL | 0 refills | Status: DC | PRN

## 2020-09-19 MED ORDER — CEFAZOLIN SODIUM-DEXTROSE 2-4 GM/100ML-% IV SOLN
INTRAVENOUS | Status: AC
  Filled 2020-09-19: qty 100

## 2020-09-19 MED ORDER — FENTANYL CITRATE (PF) 100 MCG/2ML IJ SOLN
INTRAMUSCULAR | Status: AC
  Filled 2020-09-19: qty 2

## 2020-09-19 MED ORDER — DEXAMETHASONE SODIUM PHOSPHATE 10 MG/ML IJ SOLN
INTRAMUSCULAR | Status: DC | PRN
  Administered 2020-09-19: 4 mg via INTRAVENOUS

## 2020-09-19 MED ORDER — ONDANSETRON HCL 4 MG/2ML IJ SOLN
INTRAMUSCULAR | Status: DC | PRN
  Administered 2020-09-19: 4 mg via INTRAVENOUS

## 2020-09-19 MED ORDER — PROPOFOL 10 MG/ML IV BOLUS
INTRAVENOUS | Status: DC | PRN
  Administered 2020-09-19: 200 mg via INTRAVENOUS

## 2020-09-19 MED ORDER — ONDANSETRON HCL 4 MG/2ML IJ SOLN: 4.0000 mg | Freq: Once | INTRAMUSCULAR | Status: DC | PRN

## 2020-09-19 MED ORDER — ACETAMINOPHEN 500 MG PO TABS
1000.0000 mg | ORAL_TABLET | ORAL | Status: AC
  Administered 2020-09-19: 1000 mg via ORAL

## 2020-09-19 MED ORDER — LACTATED RINGERS IV SOLN: INTRAVENOUS | Status: DC

## 2020-09-19 SURGICAL SUPPLY — 40 items
ADH SKN CLS APL DERMABOND .7 (GAUZE/BANDAGES/DRESSINGS) ×1
APL PRP STRL LF DISP 70% ISPRP (MISCELLANEOUS) ×1
BLADE CLIPPER SURG (BLADE) IMPLANT
BLADE SURG 15 STRL LF DISP TIS (BLADE) ×1 IMPLANT
BLADE SURG 15 STRL SS (BLADE) ×2
BNDG ELASTIC 3X5.8 VLCR STR LF (GAUZE/BANDAGES/DRESSINGS) IMPLANT
BNDG ELASTIC 4X5.8 VLCR STR LF (GAUZE/BANDAGES/DRESSINGS) IMPLANT
CANISTER SUCT 1200ML W/VALVE (MISCELLANEOUS) ×2 IMPLANT
CHLORAPREP W/TINT 26 (MISCELLANEOUS) ×2 IMPLANT
COVER BACK TABLE 60X90IN (DRAPES) ×2 IMPLANT
COVER MAYO STAND STRL (DRAPES) ×2 IMPLANT
COVER WAND RF STERILE (DRAPES) IMPLANT
DECANTER SPIKE VIAL GLASS SM (MISCELLANEOUS) ×2 IMPLANT
DERMABOND ADVANCED (GAUZE/BANDAGES/DRESSINGS) ×1
DERMABOND ADVANCED .7 DNX12 (GAUZE/BANDAGES/DRESSINGS) ×1 IMPLANT
DRAPE LAPAROTOMY 100X72 PEDS (DRAPES) ×2 IMPLANT
DRAPE UTILITY XL STRL (DRAPES) ×2 IMPLANT
ELECT REM PT RETURN 9FT ADLT (ELECTROSURGICAL) ×2
ELECTRODE REM PT RTRN 9FT ADLT (ELECTROSURGICAL) ×1 IMPLANT
GLOVE SURG SIGNA 7.5 PF LTX (GLOVE) ×2 IMPLANT
GOWN STRL REUS W/ TWL LRG LVL3 (GOWN DISPOSABLE) ×1 IMPLANT
GOWN STRL REUS W/ TWL XL LVL3 (GOWN DISPOSABLE) ×1 IMPLANT
GOWN STRL REUS W/TWL LRG LVL3 (GOWN DISPOSABLE) ×2
GOWN STRL REUS W/TWL XL LVL3 (GOWN DISPOSABLE) ×2
NEEDLE HYPO 25X1 1.5 SAFETY (NEEDLE) ×2 IMPLANT
NS IRRIG 1000ML POUR BTL (IV SOLUTION) ×2 IMPLANT
PACK BASIN DAY SURGERY FS (CUSTOM PROCEDURE TRAY) ×2 IMPLANT
PENCIL SMOKE EVACUATOR (MISCELLANEOUS) ×2 IMPLANT
SLEEVE SCD COMPRESS KNEE MED (STOCKING) ×2 IMPLANT
SPONGE LAP 4X18 RFD (DISPOSABLE) ×2 IMPLANT
SUT MNCRL AB 4-0 PS2 18 (SUTURE) ×2 IMPLANT
SUT VIC AB 2-0 SH 27 (SUTURE) ×2
SUT VIC AB 2-0 SH 27XBRD (SUTURE) ×1 IMPLANT
SUT VIC AB 3-0 SH 27 (SUTURE) ×2
SUT VIC AB 3-0 SH 27X BRD (SUTURE) ×1 IMPLANT
SYR BULB EAR ULCER 3OZ GRN STR (SYRINGE) ×2 IMPLANT
SYR CONTROL 10ML LL (SYRINGE) ×2 IMPLANT
TOWEL GREEN STERILE FF (TOWEL DISPOSABLE) ×2 IMPLANT
TUBE CONNECTING 20X1/4 (TUBING) ×2 IMPLANT
YANKAUER SUCT BULB TIP NO VENT (SUCTIONS) ×2 IMPLANT

## 2020-09-19 NOTE — Discharge Instructions (Signed)
  Post Anesthesia Home Care Instructions  Activity: Get plenty of rest for the remainder of the day. A responsible individual must stay with you for 24 hours following the procedure.  For the next 24 hours, DO NOT: -Drive a car -Paediatric nurse -Drink alcoholic beverages -Take any medication unless instructed by your physician -Make any legal decisions or sign important papers.  Meals: Start with liquid foods such as gelatin or soup. Progress to regular foods as tolerated. Avoid greasy, spicy, heavy foods. If nausea and/or vomiting occur, drink only clear liquids until the nausea and/or vomiting subsides. Call your physician if vomiting continues.  Special Instructions/Symptoms: Your throat may feel dry or sore from the anesthesia or the breathing tube placed in your throat during surgery. If this causes discomfort, gargle with warm salt water. The discomfort should disappear within 24 hours.  If you had a scopolamine patch placed behind your ear for the management of post- operative nausea and/or vomiting:  1. The medication in the patch is effective for 72 hours, after which it should be removed.  Wrap patch in a tissue and discard in the trash. Wash hands thoroughly with soap and water. 2. You may remove the patch earlier than 72 hours if you experience unpleasant side effects which may include dry mouth, dizziness or visual disturbances. 3. Avoid touching the patch. Wash your hands with soap and water after contact with the patch.       Next dose of Tylenol can be given after 3:08PM.      Ok to shower starting tomorrow  Ice pack, tylenol, and ibuprofen also for pain  No vigorous activity for one week

## 2020-09-19 NOTE — Anesthesia Procedure Notes (Signed)
Procedure Name: LMA Insertion Date/Time: 09/19/2020 10:40 AM Performed by: Lavonia Dana, CRNA Pre-anesthesia Checklist: Patient identified, Emergency Drugs available, Suction available and Patient being monitored Patient Re-evaluated:Patient Re-evaluated prior to induction Oxygen Delivery Method: Circle system utilized Preoxygenation: Pre-oxygenation with 100% oxygen Induction Type: IV induction Ventilation: Mask ventilation without difficulty LMA: LMA inserted LMA Size: 4.0 Number of attempts: 1 Airway Equipment and Method: Bite block Placement Confirmation: positive ETCO2 Tube secured with: Tape Dental Injury: Teeth and Oropharynx as per pre-operative assessment

## 2020-09-19 NOTE — Op Note (Signed)
EXCISION OF CHRONICALLY INFECTED UPPER BACK CYST  Procedure Note  DEAVION STRIDER 09/19/2020   Pre-op Diagnosis: CHRONICALLY INFECTED UPPER BACK CYST     Post-op Diagnosis: same  Procedure(s): EXCISION OF CHRONICALLY INFECTED UPPER BACK CYST  (2 cm)  Surgeon(s): Coralie Keens, MD  Anesthesia: General  Staff:  Circulator: McDonough-Hughes, Delene Ruffini, RN Scrub Person: Lorenza Burton, CST OR Clinical Technician: Polly Cobia C  Estimated Blood Loss: Minimal               Specimens: sent to path  Procedure: The patient was brought to the operating room identifies correct patient.  She is placed supine on the operating room table and general anesthesia was induced.  She was next turned into the right lateral decubitus position.  Her upper back was prepped and draped in usual sterile fashion.  I anesthetized the skin around the chronic cyst on the upper back with Marcaine.  I then made an elliptical incision with a scalpel.  I then dissected down to the subcutaneous tissue with electrocautery and removed the surrounding tissue with the cautery going deep to the cyst.  The cyst was approximate 2 cm in size.  It was sent to pathology for evaluation.  I cauterized the wound further and achieved hemostasis.  I irrigated it with saline.  I then closed the subcutaneous tissue with interrupted 2-0 Vicryl sutures and closed the skin with a running 4-0 Monocryl suture.  Dermabond was then applied.  The patient tolerated the procedure well.  All the counts were correct at the end of the procedure.  The patient was then extubated in the operating room and taken in a stable condition to the recovery room.          Coralie Keens   Date: 09/19/2020  Time: 11:10 AM

## 2020-09-19 NOTE — Transfer of Care (Signed)
Immediate Anesthesia Transfer of Care Note  Patient: Krista Sosa  Procedure(s) Performed: EXCISION OF CHRONICALLY INFECTED UPPER BACK CYST (N/A Back)  Patient Location: PACU  Anesthesia Type:General  Level of Consciousness: drowsy  Airway & Oxygen Therapy: Patient Spontanous Breathing and Patient connected to face mask oxygen  Post-op Assessment: Report given to RN and Post -op Vital signs reviewed and stable  Post vital signs: Reviewed and stable  Last Vitals:  Vitals Value Taken Time  BP 99/55 09/19/20 1117  Temp    Pulse 82 09/19/20 1118  Resp 22 09/19/20 1118  SpO2 100 % 09/19/20 1118    Last Pain:  Vitals:   09/19/20 0904  TempSrc: Oral  PainSc: 0-No pain      Patients Stated Pain Goal: 5 (22/29/79 8921)  Complications: No complications documented.

## 2020-09-19 NOTE — Interval H&P Note (Signed)
History and Physical Interval Note: no change in H and P  09/19/2020 9:07 AM  Krista Sosa  has presented today for surgery, with the diagnosis of CHRONICALLY INFECTED UPPER BACK CYST.  The various methods of treatment have been discussed with the patient and family. After consideration of risks, benefits and other options for treatment, the patient has consented to  Procedure(s) with comments: EXCISION OF CHRONICALLY INFECTED UPPER BACK CYST (N/A) - 45/RM8 as a surgical intervention.  The patient's history has been reviewed, patient examined, no change in status, stable for surgery.  I have reviewed the patient's chart and labs.  Questions were answered to the patient's satisfaction.     Coralie Keens

## 2020-09-19 NOTE — Anesthesia Preprocedure Evaluation (Signed)
Anesthesia Evaluation  Patient identified by MRN, date of birth, ID band Patient awake    Reviewed: Allergy & Precautions, NPO status , Patient's Chart, lab work & pertinent test results  History of Anesthesia Complications Negative for: history of anesthetic complications  Airway Mallampati: II  TM Distance: >3 FB Neck ROM: Full    Dental  (+) Upper Dentures   Pulmonary asthma , Current Smoker and Patient abstained from smoking.,    Pulmonary exam normal        Cardiovascular hypertension, Pt. on medications Normal cardiovascular exam     Neuro/Psych Anxiety Depression negative neurological ROS     GI/Hepatic Neg liver ROS, GERD  ,  Endo/Other  diabetes, Poorly Controlled, Type 2, Oral Hypoglycemic Agents, Insulin Dependent  Renal/GU negative Renal ROS  negative genitourinary   Musculoskeletal  (+) Arthritis , Osteoarthritis,    Abdominal   Peds  Hematology negative hematology ROS (+) Sickle cell trait ,   Anesthesia Other Findings   Reproductive/Obstetrics                            Anesthesia Physical Anesthesia Plan  ASA: III  Anesthesia Plan: General   Post-op Pain Management:    Induction: Intravenous  PONV Risk Score and Plan: 2 and Ondansetron, Dexamethasone, Treatment may vary due to age or medical condition and Midazolam  Airway Management Planned: Oral ETT  Additional Equipment: None  Intra-op Plan:   Post-operative Plan: Extubation in OR  Informed Consent: I have reviewed the patients History and Physical, chart, labs and discussed the procedure including the risks, benefits and alternatives for the proposed anesthesia with the patient or authorized representative who has indicated his/her understanding and acceptance.     Dental advisory given  Plan Discussed with:   Anesthesia Plan Comments:         Anesthesia Quick Evaluation

## 2020-09-19 NOTE — Anesthesia Postprocedure Evaluation (Signed)
Anesthesia Post Note  Patient: Krista Sosa  Procedure(s) Performed: EXCISION OF CHRONICALLY INFECTED UPPER BACK CYST (N/A Back)     Patient location during evaluation: PACU Anesthesia Type: General Level of consciousness: awake and alert Pain management: pain level controlled Vital Signs Assessment: post-procedure vital signs reviewed and stable Respiratory status: spontaneous breathing, nonlabored ventilation and respiratory function stable Cardiovascular status: blood pressure returned to baseline and stable Postop Assessment: no apparent nausea or vomiting Anesthetic complications: no   No complications documented.  Last Vitals:  Vitals:   09/19/20 1130 09/19/20 1156  BP: (!) 108/58 122/87  Pulse: 76 87  Resp: 14 16  Temp:  36.5 C  SpO2: 100% 100%    Last Pain:  Vitals:   09/19/20 1156  TempSrc:   PainSc: 0-No pain                 Lidia Collum

## 2020-09-20 ENCOUNTER — Encounter (HOSPITAL_BASED_OUTPATIENT_CLINIC_OR_DEPARTMENT_OTHER): Payer: Self-pay | Admitting: Surgery

## 2020-09-21 LAB — SURGICAL PATHOLOGY

## 2020-09-26 ENCOUNTER — Encounter: Payer: Medicare Other | Admitting: Internal Medicine

## 2020-09-26 NOTE — Progress Notes (Deleted)
   CC: ***  HPI:  [name] is a 70 y.o.   Past Medical History:  Diagnosis Date  . Allergy   . Asthma, chronic 06/09/2006  . Bilateral cataracts 05/07/2016   s/p left cataract extraction 10/02/2016 and right cataract extraction 10/16/2016  . Bronchiectasis without complication ([place]) 22/48/2500   Mild left lower lobe bronchiectasis seen on CT scan of the thorax in December 2019  . Cataract    bil eyes  . Essential hypertension 02/12/2018  . Gastroesophageal reflux disease 08/13/2011  . Glaucoma    Per patient report  . Hx of adenomatous colonic polyps 11/16/2008  . Hypertension    pt said she is supposed to take med but she is not taking it.  . Hypertensive retinopathy, grade 2, bilateral 05/07/2016  . Latent tuberculosis    dx 03/17/08 - tx with INH X 9 months  . Lichenoid dermatitis 02/13/2012   Previously treated with Triamcinolone 0.025% cream Q8-12H PRN   . Migraine headache 08/06/2018  . Osteoarthritis, generalized 05/21/2006   Diffuse: cervical spine, right shoulder, left wrist Ineffective medications: Tylenol, ASA, Naprosyn, Ibuprofen, Meloxicam    . Overweight (BMI 25.0-29.9) 02/13/2012  . Perennial allergic rhinitis with seasonal variation 06/09/2006  . Persistent microalbuminuria associated with type II diabetes mellitus ([place]) 05/21/2006  . Sickle cell anemia (HCC)    pt has sickle cell trait per pt  . Sickle cell trait ([place]) 02/13/2012  . Smoker    1/2ppd  . Tobacco abuse disorder 06/09/2006  . Tubular adenoma of rectum 11/16/2008   5 mm, excised endoscopically 11/16/2008, repeat colonoscopy due July 2015   . Ulnar neuropathy of right upper extremity 02/14/2016  . Urinary tract disease    JICP  . Urticaria    Review of Systems:   Constitutional: Negative for chills and fever.  Respiratory: Negative for shortness of breath.   Cardiovascular: Negative for chest pain and leg swelling.  Gastrointestinal: Negative for abdominal pain, nausea and vomiting.   Neurological: Negative for dizziness and headaches.  ***  Physical Exam:  There were no vitals filed for this visit. General: *** HEENT: *** Cardiac: *** Pulmonary: *** Abdomen: *** Extremity: *** Neuro: *** Psychiatry: ***   Assessment & Plan:   See Encounters Tab for problem based charting.  Patient discussed with Dr. {NAMES:[id number]::"Butcher","Guilloud","Hoffman","Mullen","Narendra","Raines","Vincent"}

## 2020-10-02 ENCOUNTER — Institutional Professional Consult (permissible substitution): Payer: Medicare Other | Admitting: Behavioral Health

## 2020-10-04 ENCOUNTER — Ambulatory Visit (INDEPENDENT_AMBULATORY_CARE_PROVIDER_SITE_OTHER): Payer: Medicare Other | Admitting: Internal Medicine

## 2020-10-04 ENCOUNTER — Ambulatory Visit: Payer: Medicare Other | Admitting: Pharmacist

## 2020-10-04 ENCOUNTER — Encounter: Payer: Medicare Other | Admitting: Internal Medicine

## 2020-10-04 DIAGNOSIS — E1129 Type 2 diabetes mellitus with other diabetic kidney complication: Secondary | ICD-10-CM | POA: Diagnosis not present

## 2020-10-04 DIAGNOSIS — R809 Proteinuria, unspecified: Secondary | ICD-10-CM | POA: Diagnosis not present

## 2020-10-04 DIAGNOSIS — Z720 Tobacco use: Secondary | ICD-10-CM

## 2020-10-04 DIAGNOSIS — Z79899 Other long term (current) drug therapy: Secondary | ICD-10-CM

## 2020-10-04 LAB — POCT GLYCOSYLATED HEMOGLOBIN (HGB A1C): Hemoglobin A1C: 11.7 % — AB (ref 4.0–5.6)

## 2020-10-04 LAB — GLUCOSE, CAPILLARY: Glucose-Capillary: 107 mg/dL — ABNORMAL HIGH (ref 70–99)

## 2020-10-04 NOTE — Progress Notes (Signed)
Subjective/Objective:    Patient ID: Krista Sosa, female    DOB: 06/18/1950, 70 y.o.   MRN: [id number]  HPI Patient is a 70 y.o. female who presents for medication review/management and evaluation/assistance with tobacco dependence.  She is in Sosa spirits and presents without assistance. Patient was referred and last seen by provider, Dr. Marva Panda, on 09/05/20.   Patient brought in medications and the following issues were noted: -Famotidine 20 BID not on med list -HCTZ 12.5 not on med list (replaced with combo Olmestartan - HCTZ pill) -Losartan 25mg  not on med list (replaced with combo Olmestartan - HCTZ pill) -Tresiba 15 units not on med list -Novolog 15 units with meals (breakfast + lunch) not on med list  Patient states she has been taking all of the above medications including the additional medications on her med list and was not aware to discontinue the above list. Note from Allergy/Immunology on 4/11 recommend patient discontinue Novolog and take Lantus. Patient states she remembered them saying something about this but could not recall so she continued to take what she had. She reports no current allergic reactions to [place] and Novolog and plans to continue to take medications until she re-schedules follow-up appt with Allergy/Immunology clinic.   Medication Adherence Questionnaire (A score of 2 or more points indicates risk for nonadherence)  Do you know what each of your medicines is for? 1 (1 point if no)  Do you ever have trouble remembering to take your medicine? 2 (2 points if yes)  Do you ever not take a medicine because you feel you do not need it?  1 (1 point if yes)  Do you think that any of your medicines is not helping you? 1 (1 point if yes)  Do you have any physical problems such as vision loss that keep you from taking your medicines as prescribed?  0 (2 points if yes)  Do you think any of your medicine is causing a side effect? 0 (1 point if yes)  Do you  know the names of ALL of your medicines? 1 (1 point if no)  Do you think that you need ALL of your medicines? 1 (1 point if no)  In the past 6 months, have you missed getting a refill or a new prescription filled on time? 1 (1 point if yes)  How often do you miss taking a dose of medicine? 2 Never (0 points), 1 or 2 times a month (1 points), 1 time a week (2 points), 2 or more times a week (3 points).   TOTAL SCORE 10/14   Age when started using tobacco on a daily basis 70 yo. Brand smoked [place]. Number of cigarettes/day 7.  Smokes 1 times per night.    Fagerstrom Score Question Scoring Patient Score  How soon after waking do you smoke your first cigarette? <5 mins (3) 5-30 mins (2) 31-60 mins (1) >60 mins (0) 2  Do you find it difficult NOT to smoke in places where you shouldn't? Yes(1) No (0) 0  Which cigarette would you most hate to give up? First one in AM (1)  Any other one (0) 1     How many cigarettes do you smoke/day? 10 or less (0) 11-20 (1) 21-30 (2) >30 (3)   Do you smoke more during the first few hours after waking? Yes (1) No (0) 0  Do you smoke if you are so ill you cannot get out of bed? Yes (1) No (0)  0   Total Score   Score interpretation: low 1-2, low-to-moderate 3-4, moderate 5-7, high >7  Most recent quit attempt 5-6 years ago Longest time ever been tobacco free 1 week.  Medications used in past cessation efforts include: NRT (patch) and varenicline. She states she could not tolerate either of these and does not wish to try them again.   Rates IMPORTANCE of quitting tobacco on 1-10 scale of 10. Rates CONFIDENCE of quitting tobacco on 1-10 scale of 7.  Most common triggers to use tobacco include: after eating wanting to smoke  Motivation to quit: health "I want to live longer"  Assessment/Plan:   Understanding of regimen: fair  Understanding of indications: fair  Potential of compliance: fair  Patient has known adherence challenges based on  score of 10 for questionnaire. Barriers include: lack of knowledge, forgetfulness, and lack of belief in necessity of treatment. Medication list reviewed and updated. Properly disposed of HCTZ and valsartan prescriptions for patient. Patient was provided with a printed medication list as well as a pill organizer. Plan is to send all future refills [facility] to package medications and mail them to patient's home. Provided patient phone number to set up account. Patient has appt with provider, Dr. Sherry Ruffing, following our appt, and will relay medication issues to her to discuss further with patient.  Tobacco use disorder with moderate nicotine dependence of 42 years duration in a patient who is fair candidate for success because of motivation to quit but recognizing challenges this may pose.     1. Provided information on 1 800-QUIT NOW support program for patient to obtain nicotine lozenges at no cost.  2. Consider addition of bupropion in the future.  Follow-up appointment in one month when patient has nicotine lozenges. Written patient instructions provided.  This appointment required 60 minutes of patient care (this includes precharting, chart review, review of results, and face-to-face care).  Thank you for involving pharmacy to assist in providing this patient's care.

## 2020-10-04 NOTE — Progress Notes (Signed)
Patient left without being seen.  She thought she only needed to see the doctor however had the appointment with the pharmacist earlier today since she did not want to stay.  Pharmacy patient apparently was taking Tresiba 15 units daily and NovoLog 15 units twice daily, is not taking the Lantus 10 units twice daily that had been previously prescribed.  Also taking Janumet 50-1000 mg twice daily, Jardiance 25 mg daily, and glipizide 5 mg daily.  A1c and glucose were already obtained, A1c elevated at 11.7, glucose 107.  Patient has appointment scheduled on 5/18, will contact patient with results and ask to bring in glucometer and medications on follow-up visit.

## 2020-10-04 NOTE — Patient Instructions (Addendum)
Krista Sosa it was a pleasure seeing you today.   Today we reviewed all of the medications you are currently taking. Included is an updated medication list. Please continue taking all medications as prescribed on this list.  To help you remember to take your medications:  - Supplied you with a pill organizer - [facility]  If you have any questions please call the clinic and ask to speak with me.  You must call 1-800-QUIT NOW to enroll in [employer]'s smoking cessation program. Please discuss with them nicotine lozenges.   If you have any questions please call the clinic or my direct number [phone].  Please follow-up in one month

## 2020-10-08 ENCOUNTER — Telehealth: Payer: Self-pay | Admitting: *Deleted

## 2020-10-08 DIAGNOSIS — E119 Type 2 diabetes mellitus without complications: Secondary | ICD-10-CM | POA: Diagnosis not present

## 2020-10-08 DIAGNOSIS — Z794 Long term (current) use of insulin: Secondary | ICD-10-CM | POA: Diagnosis not present

## 2020-10-08 NOTE — Telephone Encounter (Signed)
Tillie Rung, NP with [facility], called in from patient's home to report A1c is 12.7. Explained patient saw PCP on 5/12 and A1c was 11.7. Patient has appt with PCP on 5/18 at 2:15.

## 2020-10-10 ENCOUNTER — Encounter: Payer: Self-pay | Admitting: Internal Medicine

## 2020-10-10 ENCOUNTER — Ambulatory Visit (INDEPENDENT_AMBULATORY_CARE_PROVIDER_SITE_OTHER): Payer: Medicare Other | Admitting: Internal Medicine

## 2020-10-10 DIAGNOSIS — R809 Proteinuria, unspecified: Secondary | ICD-10-CM

## 2020-10-10 DIAGNOSIS — E1129 Type 2 diabetes mellitus with other diabetic kidney complication: Secondary | ICD-10-CM

## 2020-10-10 DIAGNOSIS — I10 Essential (primary) hypertension: Secondary | ICD-10-CM

## 2020-10-10 MED ORDER — TRESIBA 100 UNIT/ML ~~LOC~~ SOLN: 25.0000 [IU] | Freq: Every day | SUBCUTANEOUS | 1 refills | Status: DC

## 2020-10-10 MED ORDER — INSULIN ASPART 100 UNIT/ML IJ SOLN: 15.0000 [IU] | Freq: Three times a day (TID) | INTRAMUSCULAR | 1 refills | Status: DC

## 2020-10-10 NOTE — Patient Instructions (Signed)
[name],  It was a pleasure to see you today. Thank you for coming in.   Today we discussed your diabetes and your elevated A1c.   Please continue taking Jardiance once a day and Janument twice a day.  Increase your Tyler Aas to 25 units daily, you can continue taking this at night.  Please start taking Novolog 15 units three times a day with meals instead of the twice a day that you were taking.  Your goal blood sugars are between 70-140s.  Please start scanning your CGM more often.  Please return to clinic in 2 weeks or sooner if needed.   Thank you again for coming in.   [name].

## 2020-10-10 NOTE — Progress Notes (Signed)
CC: Diabetes  HPI:  Krista Sosa is a 70 y.o. with history of hypertension, asthma, uncontrolled type 2 diabetes, and major depressive disorders presenting for follow-up of her diabetes.  Past Medical History:  Diagnosis Date  . Allergy   . Asthma, chronic 06/09/2006  . Bilateral cataracts 05/07/2016   s/p left cataract extraction 10/02/2016 and right cataract extraction 10/16/2016  . Bronchiectasis without complication ([place]) 15/17/6160   Mild left lower lobe bronchiectasis seen on CT scan of the thorax in December 2019  . Cataract    bil eyes  . Essential hypertension 02/12/2018  . Gastroesophageal reflux disease 08/13/2011  . Glaucoma    Per patient report  . Hx of adenomatous colonic polyps 11/16/2008  . Hypertension    pt said she is supposed to take med but she is not taking it.  . Hypertensive retinopathy, grade 2, bilateral 05/07/2016  . Latent tuberculosis    dx 03/17/08 - tx with INH X 9 months  . Lichenoid dermatitis 02/13/2012   Previously treated with Triamcinolone 0.025% cream Q8-12H PRN   . Migraine headache 08/06/2018  . Osteoarthritis, generalized 05/21/2006   Diffuse: cervical spine, right shoulder, left wrist Ineffective medications: Tylenol, ASA, Naprosyn, Ibuprofen, Meloxicam    . Overweight (BMI 25.0-29.9) 02/13/2012  . Perennial allergic rhinitis with seasonal variation 06/09/2006  . Persistent microalbuminuria associated with type II diabetes mellitus ([place]) 05/21/2006  . Sickle cell anemia (HCC)    pt has sickle cell trait per pt  . Sickle cell trait ([place]) 02/13/2012  . Smoker    1/2ppd  . Tobacco abuse disorder 06/09/2006  . Tubular adenoma of rectum 11/16/2008   5 mm, excised endoscopically 11/16/2008, repeat colonoscopy due July 2015   . Ulnar neuropathy of right upper extremity 02/14/2016  . Urinary tract disease    JICP  . Urticaria    Review of Systems:   Constitutional: Negative for chills and fever.  Respiratory: Negative for shortness of  breath.   Cardiovascular: Negative for chest pain and leg swelling.  Gastrointestinal: Negative for abdominal pain, nausea and vomiting.  Neurological: Negative for dizziness and headaches.    Physical Exam:  Vitals:   10/10/20 1401  BP: (!) 115/56  Pulse: (!) 102  Temp: 98.3 F (36.8 C)  TempSrc: Oral  SpO2: 98%  Weight: 163 lb 14.4 oz (74.3 kg)  Height: 5\' 6"  (1.676 m)   Physical Exam HENT:     Head: Normocephalic and atraumatic.  Eyes:     Conjunctiva/sclera: Conjunctivae normal.     Pupils: Pupils are equal, round, and reactive to light.  Neck:     Thyroid: No thyromegaly.  Cardiovascular:     Rate and Rhythm: Normal rate and regular rhythm.     Heart sounds: Normal heart sounds. No murmur heard. No friction rub. No gallop.   Pulmonary:     Effort: Pulmonary effort is normal. No respiratory distress.     Breath sounds: Normal breath sounds. No wheezing.  Abdominal:     General: Bowel sounds are normal. There is no distension.     Palpations: Abdomen is soft.  Musculoskeletal:        General: Normal range of motion.     Cervical back: Normal range of motion and neck supple.  Skin:    General: Skin is warm and dry.     Findings: No erythema.  Neurological:     Mental Status: She is alert and oriented to person, place, and time.  Gait: Gait is intact.  Psychiatric:        Mood and Affect: Mood and affect normal.      Assessment & Plan:   See Encounters Tab for problem based charting.  Patient discussed with Dr. Dareen Piano

## 2020-10-11 NOTE — Assessment & Plan Note (Addendum)
Patient is currently on Jardiance 25 mg daily, Janumet 50-1000 mg BID, Novolog 15 units twice daily, and Tresiba 20 units daily. Patient had previously reported an allergic rash related to the [place], Lantus, and NovoLog.  She has followed up with allergy and had a Lantus insulin challenge with no signs of drug reaction.  She was supposed to be on Lantus 15 units once a day, however she states that she is taking [place] 20 units daily and NovoLog 15 units twice a day which she has been tolerating with no reaction.  CGM readings showed a 63% time CGM was active, target range 60% of the time, low 1% of the time high 33% of the time, and very high 6% of the time, highest appeared to be around the afternoon time, she is currently taking the NovoLog with breakfast and dinner, we discussed that we can add NovoLog coverage to her lunchtime meal in increase long-acting insulin slightly and she is agreeable to this. Her A1c last week was 11.7.  Now that she is tolerating her insulin we can start increasing her doses to get better control of her diabetes.   -Continue Jardiance 25 mg daily -Continue Janumet 50-1000 mg twice daily, -Increase NovoLog 15 units to 3 times a day at -Increase Tresiba to 25 units daily -Continue monitoring with CGM -RTC in 2 weeks

## 2020-10-11 NOTE — Progress Notes (Signed)
Internal Medicine Clinic Attending ° °Case discussed with Dr. Krienke  At the time of the visit.  We reviewed the resident’s history and exam and pertinent patient test results.  I agree with the assessment, diagnosis, and plan of care documented in the resident’s note.  °

## 2020-10-11 NOTE — Assessment & Plan Note (Signed)
On HCTZ-Olmesartan 12.5-20 mg daily, no issues taking her medications. BP today is 115/56. No changes.

## 2020-10-17 MED ORDER — HUMALOG 100 UNIT/ML IJ SOLN: 15.0000 [IU] | Freq: Three times a day (TID) | INTRAMUSCULAR | 1 refills | Status: DC

## 2020-10-17 NOTE — Addendum Note (Signed)
Addended by: [name] on: 10/17/2020 04:04 PM   Modules accepted: Orders

## 2020-10-20 ENCOUNTER — Encounter: Payer: Self-pay | Admitting: *Deleted

## 2020-10-23 ENCOUNTER — Other Ambulatory Visit: Payer: Self-pay

## 2020-10-23 ENCOUNTER — Telehealth: Payer: Self-pay | Admitting: Behavioral Health

## 2020-10-23 ENCOUNTER — Institutional Professional Consult (permissible substitution): Payer: Medicare Other | Admitting: Behavioral Health

## 2020-10-23 ENCOUNTER — Telehealth: Payer: Self-pay

## 2020-10-23 NOTE — Telephone Encounter (Signed)
Dr. Theodis Shove presented to triage room and informed nurse she is currently on the phone with patient for a telehealth appt and patient is asking questions r/t DM medication. RN asked Dr. Theodis Shove to inform patient a triage RN will return her call shortly. SChaplin, RN,BSN

## 2020-10-23 NOTE — Telephone Encounter (Signed)
RTC, VM obtained which was in Spanish, no message left. SChaplin, RN,BSN

## 2020-10-23 NOTE — Telephone Encounter (Signed)
Requesting to speak with a nurse about medications. Please call back.

## 2020-10-23 NOTE — Telephone Encounter (Signed)
Contacted Pt for today's call @ 1:30pm. Pt has medication concern re: the Pharmacy & getting the incorrect prescription that was just changed on 10/10/2020.  Told Pt I would have Triage RN return her call.  Dr. Theodis Shove

## 2020-10-23 NOTE — Telephone Encounter (Signed)
TY Stacee-

## 2020-10-23 NOTE — Telephone Encounter (Signed)
RTC, patient states "My medicines are still not right".  She has questions regarding which DM medications she needs to be taking and which were discontinued.    She has an upcoming appt w/ Dr. Georgina Peer on 6/9. Dr. Georgina Peer, Will you please reach out to patient regarding her medications, it was difficult to understand what/which medication she needed.  Per LOV, she was to f/u in 2 weeks.  Appt made for 10/30/20 @ 1515 w/ Dr. Wynetta Emery. Thank you, SChaplin, RN,BSN

## 2020-10-24 ENCOUNTER — Other Ambulatory Visit: Payer: Self-pay | Admitting: Internal Medicine

## 2020-10-24 MED ORDER — INSULIN ASPART 100 UNIT/ML FLEXPEN: 15.0000 [IU] | PEN_INJECTOR | Freq: Three times a day (TID) | SUBCUTANEOUS | 1 refills | Status: DC

## 2020-10-24 NOTE — Telephone Encounter (Signed)
Called patient and discussed diabetes medications with her. She states the pharmacy gave her Lantus but she cannot take that without an Epi-pen. Discussed with patient that last note states she should be continuing [place] rather than Lantus and that a prescription was sent in. In addition, note from 10/10/20 also mentions patient to continue to take Novolog but prescription sent in was for Humalog.   Patient will need new rx sent to pharmacy for Novolog. Once sent in I will follow-up with pharmacy and clarify which prescriptions should be filled for patient and which she is no longer taking. I will then call patient back to inform her.

## 2020-10-26 NOTE — Telephone Encounter (Signed)
Called patient to clarify medications again. Pharmacy told me patient picked up Tresiba pens but patient claims it was the vial. I called pharmacy back to clarify and the person on the phone this time stated it was in fact the vial. Patient will need new prescription sent in for Tresiba pens not the vial solution. Patient does not know how to use vials. Discussed with pharmacy and they inactivated all insulin prescriptions besides the Humalog pens sent in yesterday and they will fill the Tresiba pens once that is sent to them.

## 2020-10-29 ENCOUNTER — Other Ambulatory Visit: Payer: Self-pay | Admitting: Student

## 2020-10-29 MED ORDER — TRESIBA FLEXTOUCH 100 UNIT/ML ~~LOC~~ SOPN: 25.0000 [IU] | PEN_INJECTOR | Freq: Every day | SUBCUTANEOUS | 3 refills | Status: DC

## 2020-10-29 NOTE — Telephone Encounter (Signed)
Tyler Aas FlexPens [facility], thanks.

## 2020-10-30 ENCOUNTER — Encounter: Payer: Medicare Other | Admitting: Pharmacist

## 2020-10-30 ENCOUNTER — Ambulatory Visit (INDEPENDENT_AMBULATORY_CARE_PROVIDER_SITE_OTHER): Payer: Medicare Other | Admitting: Student

## 2020-10-30 ENCOUNTER — Encounter: Payer: Self-pay | Admitting: Student

## 2020-10-30 VITALS — BP 119/60 | HR 98 | Temp 98.5°F | Ht 66.0 in | Wt 169.2 lb

## 2020-10-30 DIAGNOSIS — E1165 Type 2 diabetes mellitus with hyperglycemia: Secondary | ICD-10-CM | POA: Diagnosis not present

## 2020-10-30 DIAGNOSIS — R809 Proteinuria, unspecified: Secondary | ICD-10-CM

## 2020-10-30 DIAGNOSIS — E1129 Type 2 diabetes mellitus with other diabetic kidney complication: Secondary | ICD-10-CM

## 2020-10-30 DIAGNOSIS — Z794 Long term (current) use of insulin: Secondary | ICD-10-CM | POA: Diagnosis not present

## 2020-10-30 DIAGNOSIS — I10 Essential (primary) hypertension: Secondary | ICD-10-CM

## 2020-10-30 DIAGNOSIS — IMO0002 Reserved for concepts with insufficient information to code with codable children: Secondary | ICD-10-CM

## 2020-10-30 MED ORDER — OLMESARTAN MEDOXOMIL-HCTZ 20-12.5 MG PO TABS: 1.0000 | ORAL_TABLET | Freq: Every day | ORAL | 3 refills | Status: DC

## 2020-10-30 NOTE — Assessment & Plan Note (Signed)
Patient presents to clinic for follow-up for diabetes. She reports significant confusion regarding her medication regimen. She states that she has been taking her oral medications (Janumet twice daily and Jardiance daily) as prescribed. She states that she has been using her [place] FlexPen twice daily and Humalog KwikPen twice daily. She brings with her additional a vial of lantus, glipizide and a Novolog FlexPen also. We discussed at length her prescribed regimen to which she was able to teach back effectively. She will require close follow-up with our team. -Continue empagliflozin (Jardiance) 25mg  daily -Continue sitagliptin-metformin (Janumet) 50-1000mg  twice daily -Use insulin degludec Tyler Aas FlexTouch Pen) 25 units daily -Use insulin lispro (Humalog KwikPen) 15 units three times daily -Follow-up in 2 weeks

## 2020-10-30 NOTE — Assessment & Plan Note (Signed)
Patient's blood pressure well controlled at today's visit (119/60). She recently ran out of her prescribed olmesartan-HCTZ 20-12.5mg  -Refill olmesartan-HCTZ 20-12.5mg 

## 2020-10-30 NOTE — Patient Instructions (Addendum)
Ms. Fiumara,  It was a pleasure seeing you today in clinic.  For your diabetes, we suggest that you take the following regimen:  -Continue empagliflozin (Jardiance) 25mg  daily - PILL  -Continue sitagliptin-metformin (Janumet) 50-1000mg  twice daily - PILL  -Use insulin degludec Tyler Aas FlexTouch Pen) 25 units daily - GREEN PEN  -Use insulin lispro (Humalog KwikPen) 15 units three times daily - ORANGE PEN  -Follow-up in 2 weeks  For your hypertension, we suggest the following regimen: -Olmesartan-HCTZ 20-12.5mg  daily  Sincerely, Dr. Paulla Dolly, MD

## 2020-10-30 NOTE — Progress Notes (Signed)
   CC: T2DM follow-up  HPI:  [name] is a 70 y.o. with uncontrolled type 2 diabetes who presents to clinic for 2-week follow-up. Refer to problem list for charting of this encounter.  Past Medical History:  Diagnosis Date  . Allergy   . Asthma, chronic 06/09/2006  . Bilateral cataracts 05/07/2016   s/p left cataract extraction 10/02/2016 and right cataract extraction 10/16/2016  . Bronchiectasis without complication ([place]) 88/50/2774   Mild left lower lobe bronchiectasis seen on CT scan of the thorax in December 2019  . Cataract    bil eyes  . Essential hypertension 02/12/2018  . Gastroesophageal reflux disease 08/13/2011  . Glaucoma    Per patient report  . Hx of adenomatous colonic polyps 11/16/2008  . Hypertension    pt said she is supposed to take med but she is not taking it.  . Hypertensive retinopathy, grade 2, bilateral 05/07/2016  . Latent tuberculosis    dx 03/17/08 - tx with INH X 9 months  . Lichenoid dermatitis 02/13/2012   Previously treated with Triamcinolone 0.025% cream Q8-12H PRN   . Migraine headache 08/06/2018  . Osteoarthritis, generalized 05/21/2006   Diffuse: cervical spine, right shoulder, left wrist Ineffective medications: Tylenol, ASA, Naprosyn, Ibuprofen, Meloxicam    . Overweight (BMI 25.0-29.9) 02/13/2012  . Perennial allergic rhinitis with seasonal variation 06/09/2006  . Persistent microalbuminuria associated with type II diabetes mellitus ([place]) 05/21/2006  . Sickle cell anemia (HCC)    pt has sickle cell trait per pt  . Sickle cell trait ([place]) 02/13/2012  . Smoker    1/2ppd  . Tobacco abuse disorder 06/09/2006  . Tubular adenoma of rectum 11/16/2008   5 mm, excised endoscopically 11/16/2008, repeat colonoscopy due July 2015   . Ulnar neuropathy of right upper extremity 02/14/2016  . Urinary tract disease    JICP  . Urticaria    Review of Systems:  Denies chest pain, shortness of breath, nausea, vomiting, diarrhea, polyuria,  polydipsia.  Physical Exam:  Vitals:   10/30/20 1523  BP: 119/60  Pulse: 98  Temp: 98.5 F (36.9 C)  TempSrc: Oral  SpO2: 98%  Weight: 169 lb 3.2 oz (76.7 kg)  Height: 5\' 6"  (1.676 m)   Physical Exam Constitutional:      General: She is not in acute distress.    Appearance: She is not ill-appearing.  Cardiovascular:     Rate and Rhythm: Normal rate and regular rhythm.     Pulses: Normal pulses.     Heart sounds: Normal heart sounds.  Pulmonary:     Effort: Pulmonary effort is normal.     Breath sounds: Normal breath sounds.  Abdominal:     General: Abdomen is flat. Bowel sounds are normal.     Palpations: Abdomen is soft.     Tenderness: There is no abdominal tenderness.  Skin:    General: Skin is warm and dry.     Capillary Refill: Capillary refill takes less than 2 seconds.  Neurological:     General: No focal deficit present.     Mental Status: She is alert. Mental status is at baseline.  Psychiatric:        Mood and Affect: Mood normal.        Behavior: Behavior normal.    Assessment & Plan:   See Encounters Tab for problem based charting.  Patient discussed with Dr. Evette Doffing

## 2020-11-01 ENCOUNTER — Encounter: Payer: Medicare Other | Admitting: Pharmacist

## 2020-11-02 DIAGNOSIS — Z9189 Other specified personal risk factors, not elsewhere classified: Secondary | ICD-10-CM

## 2020-11-02 NOTE — Progress Notes (Signed)
[facility])                                            G[place]ling Team                                        Statin Quality Measure Assessment    11/02/2020  Krista Sosa 05-30-1950 [id number]  Per review of chart and payor information, this patient has been flagged for non-adherence to the following CMS Quality Measure:   []  Statin Use in Persons with Diabetes  []  Statin Use in Persons with Cardiovascular Disease  The 10-year ASCVD risk score [name]., et al., 2013) is: 32.5%   Values used to calculate the score:     Age: 70 years     Sex: Female     Is Non-Hispanic African American: Yes     Diabetic: Yes     Tobacco smoker: Yes     Systolic Blood Pressure: 163 mmHg     Is BP treated: Yes     HDL Cholesterol: 66 mg/dL     Total Cholesterol: 182 mg/dL  10/10/2019  Currently prescribed statin:  [x]  Yes []  No     Comments: atorvastatin 40 mg daily Per [place] this medication was last received on 05/03/2020 for 3 months supply. [facility] no record of [facility]orvastatin on file. I called patient but was unable to reach. At this time, it is unclear if the patient has compliance issues, medication stockpile, or filling medication somewhere else.  Next appt w/ provider 11/05/2020. Please consider provider discussion of medication compliance if appropriate. If the patient discontinued atorvastatin d/t statin intolerance please update problem list and associate intolerance (see codes below) to the encounter.     Please consider ONE of the following recommendations if appropriate:   Initiate high intensity statin Atorvastatin 40mg  once daily, #90, 3 refills   Rosuvastatin 20mg  once daily, #90, 3 refills    Initiate moderate intensity          statin with reduced frequency if prior          statin intolerance 1x weekly, #13, 3 refills   2x weekly, #26, 3 refills   3x weekly, #39, 3 refills    Code  for past statin intolerance or other exclusions (required annually)  Drug Induced Myopathy G72.0   Myositis, unspecified M60.9   Rhabdomyolysis M62.82   Prediabetes R73.03   Adverse effect of antihyperlipidemic and antiarteriosclerotic drugs, initial encounter A45.3M4W    Thank you for your time,  Kristeen Miss, [place] Cell: [phone]

## 2020-11-05 ENCOUNTER — Ambulatory Visit: Payer: Medicare Other | Admitting: Behavioral Health

## 2020-11-05 DIAGNOSIS — F419 Anxiety disorder, unspecified: Secondary | ICD-10-CM

## 2020-11-05 DIAGNOSIS — F331 Major depressive disorder, recurrent, moderate: Secondary | ICD-10-CM
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
## 2020-11-05 NOTE — Addendum Note (Signed)
Addended by: Lalla Brothers T on: 11/05/2020 08:27 AM   Modules accepted: Level of Service

## 2020-11-05 NOTE — Progress Notes (Signed)
Internal Medicine Clinic Attending  Case discussed with Dr. Johnson  At the time of the visit.  We reviewed the resident's history and exam and pertinent patient test results.  I agree with the assessment, diagnosis, and plan of care documented in the resident's note.  

## 2020-11-07 MED ORDER — ATORVASTATIN CALCIUM 40 MG PO TABS: 40.0000 mg | ORAL_TABLET | Freq: Every day | ORAL | 11 refills | Status: DC

## 2020-11-07 NOTE — Addendum Note (Signed)
Addended by: [name] on: 11/07/2020 01:39 PM   Modules accepted: Orders

## 2020-11-22 ENCOUNTER — Ambulatory Visit: Payer: Medicare Other | Admitting: Behavioral Health

## 2020-11-22 DIAGNOSIS — F419 Anxiety disorder, unspecified: Secondary | ICD-10-CM

## 2020-11-22 DIAGNOSIS — F331 Major depressive disorder, recurrent, moderate: Secondary | ICD-10-CM
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
## 2020-12-03 ENCOUNTER — Encounter: Payer: Medicare Other | Admitting: Student

## 2020-12-03 ENCOUNTER — Encounter: Payer: Medicare Other | Admitting: Internal Medicine

## 2020-12-12 ENCOUNTER — Telehealth: Payer: Self-pay

## 2020-12-12 NOTE — Telephone Encounter (Signed)
[facility] rep is requesting a call back about PT .Marland [phone] Revonda Humphrey

## 2020-12-13 ENCOUNTER — Ambulatory Visit (INDEPENDENT_AMBULATORY_CARE_PROVIDER_SITE_OTHER): Payer: Medicare Other | Admitting: Student

## 2020-12-13 ENCOUNTER — Encounter: Payer: Self-pay | Admitting: Student

## 2020-12-13 VITALS — BP 135/67 | HR 90 | Wt 175.8 lb

## 2020-12-13 DIAGNOSIS — R809 Proteinuria, unspecified: Secondary | ICD-10-CM

## 2020-12-13 DIAGNOSIS — Z0000 Encounter for general adult medical examination without abnormal findings: Secondary | ICD-10-CM | POA: Diagnosis not present

## 2020-12-13 DIAGNOSIS — IMO0002 Reserved for concepts with insufficient information to code with codable children: Secondary | ICD-10-CM

## 2020-12-13 DIAGNOSIS — Z23 Encounter for immunization: Secondary | ICD-10-CM

## 2020-12-13 DIAGNOSIS — E1129 Type 2 diabetes mellitus with other diabetic kidney complication: Secondary | ICD-10-CM

## 2020-12-13 DIAGNOSIS — E1165 Type 2 diabetes mellitus with hyperglycemia: Secondary | ICD-10-CM

## 2020-12-13 DIAGNOSIS — Z794 Long term (current) use of insulin: Secondary | ICD-10-CM

## 2020-12-13 MED ORDER — ZOSTER VAC RECOMB ADJUVANTED 50 MCG/0.5ML IM SUSR: 0.5000 mL | Freq: Once | INTRAMUSCULAR | 0 refills | Status: AC

## 2020-12-13 NOTE — Telephone Encounter (Signed)
Number provided is for [facility]. No answer. Left message on VM requesting return call.

## 2020-12-13 NOTE — Assessment & Plan Note (Addendum)
Krista Sosa is presenting to clinic today to discuss diabetes. At her previous visit, her insulin regimen was changed with plans to follow-up in two weeks due to concerns for medication adherence/comprehension. Per chart review, this has been an ongoing issue for this patient. Last A1c 11.7 two months ago.  Today Krista Sosa reports that she has been taking [place] 25 units daily, Novolog 15 units twice daily, Humalog 15 units twice daily, Jardiance, and Janumet. She says she sometimes will not take the Humalog as it has made her sick to her stomach. Mentions that she often feels dizzy and lightheaded in the morning and evening. Her CGM stopped working a few days ago and she has not checked her sugars since. States previous readings were in the 100's-200's. She also notes that she has seen low's in the 60's and 70's intermittently. She continues to endorse polyuria and polydipsia at this time. Notes that she feels frustrated with our clinic because "we cannot seem to get it right."   I discussed with Krista Sosa that the regimen she has been taking is too much insulin for her. Instructed her to stop taking Novolog and only take [place] and Humalog for her insulins. In clinic today we disposed of the Novolog pen that she brought. She does mention that she has more at home. Graciously, our diabetes educator was able to work with her today to fix the CGM. She has also worked with Dr. Georgina Peer in the past. Discussed with multiple staff members in clinic who have seen her multiple times in the past. There is a concern for possible poor literacy vs cognitive impairment contributing to her lack of regimen adherence. Will discuss with Dr. Georgina Peer and Butch Penny, both who will see her within the next two weeks.  - Tyler Aas 25 units daily - Humalog 15 units twice daily - Jardiance '25mg'$  once daily - Janumet 50-'1000mg'$  twice daily - Follow-up with Butch Penny and Dr. Georgina Peer within next two weeks - A1c in one month - At next  visit consider mini-cog test

## 2020-12-13 NOTE — Telephone Encounter (Signed)
Abby from [facility] returned call. States per Guidelines, patient should be on a statin 2/2 diabetes. Explained Atorvastatin 40 mg # 30 with 11 refills was sent to [facility] on 11/07/20. Abby was very Patent attorney.

## 2020-12-13 NOTE — Patient Instructions (Signed)
Ms.Akhila Luan Moore, it was a pleasure seeing you today!  Today we discussed: - Diabetes: Please see below for the regimen you should be on: JARDIANCE 25mg  ONCE DAILY JANUMET 50-1000mg  TWICE DAILY TRESIBA 25 UNITS DAILY HUMALOG 15 UNITS TWICE DAILY  - In the mean time, please use your fingerstick meter to check sugars.  - Do not take Novolog  - Please follow-up with Butch Penny  Follow-up:  2 weeks    Please make sure to arrive 15 minutes prior to your next appointment. If you arrive late, you may be asked to reschedule.   We look forward to seeing you next time. Please call our clinic at [phone] if you have any questions or concerns. The best time to call is Monday-Friday from 9am-4pm, but there is someone available 24/7. If after hours or the weekend, call the main hospital number and ask for the Internal Medicine Resident On-Call. If you need medication refills, please notify your pharmacy one week in advance and they will send [place] a request.  Thank you for letting us take part in your care. Wishing you the best!  Thank you, Sanjuan Dame, MD

## 2020-12-13 NOTE — Assessment & Plan Note (Signed)
-   RX given for Shingrix  - Discussed she should get Tdap at [place] exam completed today

## 2020-12-13 NOTE — Progress Notes (Signed)
   CC: diabetes follow-up  HPI:  Krista Sosa is a 70 y.o. with medical history as below presenting to [facility] for diabetes follow-up.  Please see problem-based list for further details, assessments, and plans.  Past Medical History:  Diagnosis Date   Allergy    Asthma, chronic 06/09/2006   Bilateral cataracts 05/07/2016   s/p left cataract extraction 10/02/2016 and right cataract extraction 10/16/2016   Bronchiectasis without complication ([place]) [id number]   Mild left lower lobe bronchiectasis seen on CT scan of the thorax in December 2019   Cataract    bil eyes   Essential hypertension 02/12/2018   Gastroesophageal reflux disease 08/13/2011   Glaucoma    Per patient report   Hx of adenomatous colonic polyps 11/16/2008   Hypertension    pt said she is supposed to take med but she is not taking it.   Hypertensive retinopathy, grade 2, bilateral 05/07/2016   Latent tuberculosis    dx 03/17/08 - tx with INH X 9 months   Lichenoid dermatitis 02/13/2012   Previously treated with Triamcinolone 0.025% cream Q8-12H PRN    Migraine headache 08/06/2018   Osteoarthritis, generalized 05/21/2006   Diffuse: cervical spine, right shoulder, left wrist Ineffective medications: Tylenol, ASA, Naprosyn, Ibuprofen, Meloxicam     Overweight (BMI 25.0-29.9) 02/13/2012   Perennial allergic rhinitis with seasonal variation 06/09/2006   Persistent microalbuminuria associated with type II diabetes mellitus ([place]) 05/21/2006   Sickle cell anemia (HCC)    pt has sickle cell trait per pt   Sickle cell trait ([place]) 02/13/2012   Smoker    1/2ppd   Tobacco abuse disorder 06/09/2006   Tubular adenoma of rectum 11/16/2008   5 mm, excised endoscopically 11/16/2008, repeat colonoscopy due July 2015    Ulnar neuropathy of right upper extremity 02/14/2016   Urinary tract disease    JICP   Urticaria    Review of Systems:  As per HPI  Physical Exam:  Vitals:   12/13/20 0924  BP: 135/67  Pulse: 90  SpO2: 100%   Weight: 175 lb 12.8 oz (79.7 kg)   General: Well-developed, well-nourished. No acute distress CV: Regular rate, rhythm. No murmurs, rubs, gallops appreciated. Pulm: Normal work of breathing on room air. Clear to auscultation bilaterally. MSK: Normal bulk, tone. No pitting edema bilaterally. Feet: Pulses 2+ bilaterally. No open wounds appreciated. Skin: Warm, dry. No rashes or lesions appreciated. Neuro: Awake, alert, answering questions appropriately. Psych: Flat affect. Normal mood, speech.  Assessment & Plan:   See Encounters Tab for problem based charting.  Patient discussed with Dr. Dareen Piano

## 2020-12-17 ENCOUNTER — Ambulatory Visit: Payer: Medicare Other | Admitting: Behavioral Health

## 2020-12-17 ENCOUNTER — Other Ambulatory Visit: Payer: Self-pay

## 2020-12-17 DIAGNOSIS — F331 Major depressive disorder, recurrent, moderate: Secondary | ICD-10-CM

## 2020-12-17 DIAGNOSIS — F419 Anxiety disorder, unspecified: Secondary | ICD-10-CM

## 2020-12-17 NOTE — Progress Notes (Signed)
Internal Medicine Clinic Attending ? ?Case discussed with Dr. Braswell  At the time of the visit.  We reviewed the resident?s history and exam and pertinent patient test results.  I agree with the assessment, diagnosis, and plan of care documented in the resident?s note.  ?
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
## 2021-01-03 ENCOUNTER — Ambulatory Visit (INDEPENDENT_AMBULATORY_CARE_PROVIDER_SITE_OTHER): Payer: Medicare Other | Admitting: Pharmacist

## 2021-01-03 DIAGNOSIS — E1165 Type 2 diabetes mellitus with hyperglycemia: Secondary | ICD-10-CM | POA: Diagnosis not present

## 2021-01-03 DIAGNOSIS — R809 Proteinuria, unspecified: Secondary | ICD-10-CM | POA: Diagnosis not present

## 2021-01-03 DIAGNOSIS — Z794 Long term (current) use of insulin: Secondary | ICD-10-CM | POA: Diagnosis not present

## 2021-01-03 DIAGNOSIS — E1129 Type 2 diabetes mellitus with other diabetic kidney complication: Secondary | ICD-10-CM | POA: Diagnosis not present

## 2021-01-03 DIAGNOSIS — IMO0002 Reserved for concepts with insufficient information to code with codable children: Secondary | ICD-10-CM

## 2021-01-03 NOTE — Progress Notes (Signed)
Subjective:    Patient ID: Krista Sosa, female    DOB: 1950/12/30, 70 y.o.   MRN: [id number]  HPI  Patient is a 70 y.o. female who presents for diabetes management and Libre 2.0 follow-up. She is in Sosa spirits and presents without assistance. Patient was referred and last seen by provider, Dr. Collene Gobble on 12/13/20.  Insurance coverage/medication affordability: UHC Medicare/Medicaid  Current diabetes medications include: insulin degludec Tyler Aas) 25 units once daily, Humalog 15 units BID, Jardiance 25 mg once daily, Janumet 50-1058m BID  Current hypertension medications include: olmesartan-hydrochlorothiazide 20-12.531m Current hyperlipidemia medications include: atorvastatin 4076matient states that She is taking her medications as prescribed. Patient reports adherence with medications. Patient states that She misses her nighttime medications 1-2 times per week, on average.  Do you feel that your medications are working for you?  yes  Have you been experiencing any side effects to the medications prescribed? no  Do you have any problems obtaining medications due to transportation or finances?  no     Patient reports hypoglycemic events. Patient reports polyuria (increased urination).  Patient reports polyphagia (increased appetite).  Patient reports polydipsia (increased thirst).  Patient denies neuropathy (nerve pain). Patient reports visual changes. Patient reports self foot exams.   Freestyle Libre 2.0 patient education Patient taking >500 mg Vitamin C: denies Reminded patient to not take Vitamin C with Freestyle Libre.  Sensor application -- sensor placed on left 1. Site selection and site prep with alcohol pad/Skin Tac 2. Sensor prep-sensor pack and sensor applicator 3. Starting the sensor: 1 hour warm up before BG readings available     Will ask for fingersticks the first 12 hours   4. Sensor change every 14 days and rotate site 5. Call [employer] customer service if  sensor comes off before 14 days  Safety and Troubleshooting 1. Scan the sensor at least every 8 hours 2. When the "test BG" symbol appears, test fingerstick blood glucose prior to    making treatment decisions 3. Do a fingerstick blood glucose test if the sensor readings do not match how    you feel 4. Remove sensor prior for MRI or CT. Sensor may be damaged by exposure to    airport x-ray screening 5. Vitamin C may cause false high readings and aspirin may cause false low     readings 6. Store sensor kit between 39 and 77 degrees. Can be refrigerated at this temp.  Contact information provided for [employer] service and/or trainer.  Objective:   Labs:   Physical Exam Neurological:     Mental Status: She is alert. She is disoriented.   Lab Results  Component Value Date   HGBA1C 11.7 (A) 10/04/2020   HGBA1C 9.8 (A) 06/20/2020   HGBA1C 8.4 (A) 04/24/2020    Lab Results  Component Value Date   MICRALBCREAT 185 (H) 09/29/2018    Lipid Panel     Component Value Date/Time   CHOL 182 10/10/2019 1500   TRIG 135 10/10/2019 1500   HDL 66 10/10/2019 1500   CHOLHDL 2.8 10/10/2019 1500   CHOLHDL 3.7 02/25/2013 1139   VLDL 23 02/25/2013 1139   LDLCALC 93 10/10/2019 1500    Clinical Atherosclerotic Cardiovascular Disease (ASCVD): No  The 10-year ASCVD risk score (GoMikey Bussing Jr., et al., 2013) is: 40.6%   Values used to calculate the score:     Age: 61 6ars     Sex: Female     Is Non-Hispanic African American: Yes  Diabetic: Yes     Tobacco smoker: Yes     Systolic Blood Pressure: 673 mmHg     Is BP treated: Yes     HDL Cholesterol: 66 mg/dL     Total Cholesterol: 182 mg/dL    Assessment/Plan:   T2DM is mostly controlled based on CGM data. Medication adherence appears optimal. Additional pharmacotherapy is not appropriate at this time. Will continue current medications and consider adjusting based on next A1C. Following instruction patient verbalized  understanding of treatment plan. Freestyle Libre 2.0 CGM placed on patient's left successfully.  nsulin degludec Tyler Aas) 25 units once daily, Humalog 15 units BID, Jardiance 25 mg once daily, Janumet 50-1069m BID    Continued basal insulin degludec (Tyler Aas 25 units once daily. Continued  rapid insulin lispro (Humalog) 15 units BID  Continued Janumet 50-10051mBID Continued SGLT2-I Jardiance 2562mnce daily Continue to use Libre 2.0 CGM Extensively discussed pathophysiology of diabetes, dietary effects on blood sugar control, and recommended lifestyle interventions. Counseled on s/sx of and management of hypoglycemia Next A1C anticipated August 2022.   Follow-up appointment four weeks to review sugar readings. Written patient instructions provided.  This appointment required 30 minutes of direct patient care.  Thank you for involving pharmacy to assist in providing this patient's care.

## 2021-01-03 NOTE — Patient Instructions (Addendum)
Krista Sosa it was a pleasure seeing you today.   The sensor is small waterproof disc that is placed on the back of the upper arm.  There is a very thin filament that is inserted under the surface of the skin and measures the amount of glucose in the interstitial fluid.  This system collects your sugar levels for up to 14 days and it automatically records the glucose level every 15 minutes. This will show your provider any patterns in your glucose levels.  Please remember... 1. Sensor will last 14 days 2. Sensor should be applied to area away from scarring, tattoos, irritation, and bones. 3. Starting the sensor: 1 hour warm up before BG readings available   4. Scan the sensor at least every 8 hours 5. Hold reader within 1.5 inches of sensor to scan 6. When the blood drop and magnifying glass symbol appears, test fingerstick blood glucose prior to making treatment decisions 7. Do a fingerstick blood glucose test if the sensor readings do not match how you feel 8. Remove sensor prior to magnetic resonance imaging (MRI), computed tomography (CT) scan, or high-frequency electrical heat (diathermy) treatment. 9. Freestyle Libre may be worn through a Environmental education officer. It may not be exposed to an advanced Imaging Technology (AIT) body scanner (also called a millimeter wave scanner) or the baggage x-ray machine. Instead, ask for hand-wanding or full-body pat-down and visual inspection.  10. Doses of vitamin C (ascorbic acid) >500 mg every day may cause false high readings. 11. Do not submerge more than 3 feet or keep underwater longer than 30 minutes at a time. Gently pat to dry.  12. Store sensor kit between 39 and 77 degrees Farenheit. Can be refrigerated within this temperature range.  Problems with Freestyle Libre sticking? 1. Order Tegaderm I.V. films to place directly over [facility] sensor on arm. 2. May also order Skin Tac from [facility]. Alcohol swab area you plan to administer  Freestyle Libre then let dry. Once dry, apply Skin Tac in a circular motion (with a spot in the middle for sensor without skin tac) and let dry. Once dry you can apply Freestyle Libre   Problems taking off Freestyle Marcus? 1. Remember to try to shower before removing Freestyle Libre 2. Order Tac Away to help remove any extra adhesive left on your skin once you remove Freestyle Libre 3. May also try baby oil to loosen adhesive  [place] Phone number: [phone] Available 7 days a week; excluding holidays 8 AM to 8PM EST  [email or web address]  Please do the following:  Continue your medications as directed today during your appointment. If you have any questions or if you believe something has occurred because of this change, call me or your doctor to let one of [place] know.  Continue checking blood sugars at home. It's really important that you record these and bring these in to your next doctor's appointment.  Continue making the lifestyle changes we've discussed together during our visit. Diet and exercise play a significant role in improving your blood sugars.  Follow-up with me on September 1st.   Hypoglycemia or low blood sugar:   Low blood sugar can happen quickly and may become an emergency if not treated right away.   While this shouldn't happen often, it can be brought upon if you skip a meal or do not eat enough. Also, if your insulin or other diabetes medications are dosed too high, this can cause your blood sugar to go  to low.   Warning signs of low blood sugar include: Feeling shaky or dizzy Feeling weak or tired  Excessive hunger Feeling anxious or upset  Sweating even when you aren't exercising  What to do if I experience low blood sugar? Follow the Rule of 15 Check your blood sugar. If lower than 70, proceed to step 2.  Treat with 15 grams of fast acting carbs which is found in 3-4 glucose tablets. If none are available you can try hard candy, 1  tablespoon of sugar or honey,4 ounces of fruit juice, or 6 ounces of REGULAR soda.  Re-check your sugar in 15 minutes. If it is still below 70, do what you did in step 2 again. If your blood sugar has come back up, go ahead and eat a snack or small meal made up of complex carbs (ex. Whole grains) and protein at this time to avoid recurrence of low blood sugar.     Your annual wellness visit was on 09/05/20. Your last provider visit was on 12/13/20. Your flu shot was on 03/26/20.

## 2021-01-07 NOTE — Assessment & Plan Note (Signed)
T2DM is mostly controlled based on CGM data. Medication adherence appears optimal. Additional pharmacotherapy is not appropriate at this time. Will continue current medications and consider adjusting based on next A1C. Following instruction patient verbalized understanding of treatment plan. Freestyle Libre 2.0 CGM placed on patient's left successfully.  nsulin degludec Tyler Aas) 25 units once daily, Humalog 15 units BID, Jardiance 25 mg once daily, Janumet 50-'1000mg'$  BID    1. Continued basal insulin degludec Tyler Aas) 25 units once daily. 2. Continued  rapid insulin lispro (Humalog) 15 units BID  3. Continued Janumet 50-'1000mg'$  BID 4. Continued SGLT2-I Jardiance '25mg'$  once daily 5. Continue to use Libre 2.0 CGM 6. Extensively discussed pathophysiology of diabetes, dietary effects on blood sugar control, and recommended lifestyle interventions. 7. Counseled on s/sx of and management of hypoglycemia 8. Next A1C anticipated August 2022.   Follow-up appointment four weeks to review sugar readings. Written patient instructions provided.

## 2021-01-07 NOTE — Addendum Note (Signed)
Addended by: Hulan Fray on: 01/07/2021 05:46 PM   Modules accepted: Orders

## 2021-01-08 ENCOUNTER — Other Ambulatory Visit: Payer: Self-pay | Admitting: Family

## 2021-01-09 ENCOUNTER — Ambulatory Visit: Payer: Medicare Other | Admitting: Behavioral Health

## 2021-01-09 ENCOUNTER — Telehealth: Payer: Self-pay

## 2021-01-09 ENCOUNTER — Telehealth: Payer: Self-pay | Admitting: Allergy

## 2021-01-09 DIAGNOSIS — Z794 Long term (current) use of insulin: Secondary | ICD-10-CM | POA: Diagnosis not present

## 2021-01-09 DIAGNOSIS — F419 Anxiety disorder, unspecified: Secondary | ICD-10-CM

## 2021-01-09 DIAGNOSIS — F331 Major depressive disorder, recurrent, moderate: Secondary | ICD-10-CM

## 2021-01-09 DIAGNOSIS — E119 Type 2 diabetes mellitus without complications: Secondary | ICD-10-CM | POA: Diagnosis not present

## 2021-01-09 MED ORDER — ADVAIR HFA 230-21 MCG/ACT IN AERO: 2.0000 | INHALATION_SPRAY | Freq: Two times a day (BID) | RESPIRATORY_TRACT | 5 refills | Status: DC

## 2021-01-09 NOTE — Telephone Encounter (Signed)
Return pt's call -  stated she tried to get her inhaler [facility]) refilled but [facility] told her the refill was refused by her doctor. According to the chart, it was refused by [facility] b/c pt needs an appt - pt informed; requested office phone # which was given to pt. Also per pt's request, call transferred to the front office so she can schedule an appt w/her new PCP.    WIXELA INHUB 500-50 MCG/ACT AEPB [Pharmacy Med Name: [facility] 500-50 INHUB] 60 each 3 01/08/2021   Sig:  INHALE 1 PUFF INTO THE LUNGS IN THE MORNING AND AT BEDTIME.  Class:  Normal  DAW:  No  Reason for Refusal:  Patient needs an appointment  Refused By:  Felipa Emory, CMA

## 2021-01-09 NOTE — Telephone Encounter (Signed)
Patient called and made appointment for 02/01/2021 with dr Maudie Mercury. She needs to have her inhaler called in, that is the one that is easily to use. [phone].

## 2021-01-09 NOTE — BH Specialist Note (Signed)
Integrated Behavioral Health via Telemedicine Visit  01/09/2021 Krista Sosa [id number]  Number of [place] visits: 4/6 Session Start time: 1:00pm  Session End time: 1:30pm Total time: 30  Referring Provider: Dr. Lonia Skinner, MD Patient/Family location: Pt is home in private [facility] Provider location: [facility] Office All persons participating in visit: Pt & Clinician Types of Service: Individual psychotherapy  I connected with [name] and/or Wandra Arthurs Jou's  self  via  Telephone or [employer]  (Video is Caregility application) and verified that I am speaking with the correct person using two identifiers. Discussed confidentiality:  4th visit  I discussed the limitations of telemedicine and the availability of in person appointments.  Discussed there is a possibility of technology failure and discussed alternative modes of communication if that failure occurs.  I discussed that engaging in this telemedicine visit, they consent to the provision of behavioral healthcare and the services will be billed under their insurance.  Patient and/or legal guardian expressed understanding and consented to Telemedicine visit:  4th visit  Presenting Concerns: Patient and/or family reports the following symptoms/concerns: reduced anx/dep due to discussion w/female neighbor about her concerns of how neighbors treat her. Pt feels more safety now. Duration of problem: months; Severity of problem: moderate  Patient and/or Family's Strengths/Protective Factors: Social connections, Social and Emotional competence, Concrete supports in place (healthy food, safe environments, etc.), and Sense of purpose  Goals Addressed: Patient will:  Reduce symptoms of: anxiety, depression, and stress   Increase knowledge and/or ability of: coping skills, healthy habits, and stress reduction   Demonstrate ability to: Increase healthy adjustment to current life  circumstances  Progress towards Goals: Ongoing  Interventions: Interventions utilized:  Solution-Focused Strategies and Supportive Counseling Standardized Assessments completed:  screeners prn  Patient and/or Family Response: Pt receptive to call today & requests future appt.  Assessment: Patient currently experiencing reduced anx/dep due to recent efforts of Family to secure her safety.   Patient may benefit from cont'd ck-in sessions for support.  Plan: Follow up with behavioral health clinician on : 2-3 wks on telehealth for 30 min Behavioral recommendations: Cont to rely on Son Eliseo Squires for awareness & support for current situation Referral(s): [place] (In Clinic)  I discussed the assessment and treatment plan with the patient and/or parent/guardian. They were provided an opportunity to ask questions and all were answered. They agreed with the plan and demonstrated an understanding of the instructions.   They were advised to call back or seek an in-person evaluation if the symptoms worsen or if the condition fails to improve as anticipated.  Donnetta Hutching, LMFT

## 2021-01-09 NOTE — Telephone Encounter (Signed)
Pt is requesting a call back .. she stated that our pharmacist changed her inhaler but now her pharmacy is not willing to dispense the medicine  and told her that her PCP deny it

## 2021-01-09 NOTE — Telephone Encounter (Signed)
Sent in Rx for Advair 257mg 2 puffs twice a day with spacer and rinse mouth afterwards.  Stop Wixela.

## 2021-01-24 ENCOUNTER — Encounter: Payer: Medicare Other | Admitting: Pharmacist

## 2021-01-31 NOTE — Progress Notes (Deleted)
Follow Up Note  RE: Krista Sosa MRN: [id number] DOB: 1951-04-27 Date of Office Visit: 02/01/2021  Referring provider: Wayland Denis, MD Primary care provider: Wayland Denis, MD  Chief Complaint: No chief complaint on file.  History of Present Illness: I had the pleasure of seeing Krista Sosa for a follow up visit at the [facility] [place] on 01/31/2021. She is a 70 y.o. female, who is being followed for adverse drug reactions, asthma. Her previous allergy office visit was on 09/03/2020 with Krista Charon FNP. Today is a regular follow up visit.  1. Adverse effect of drug, subsequent encounter       No orders of the defined types were placed in this encounter.     Patient Instructions  Jaice tolerated the Lantus Solostar insulin challenge today without any adverse signs or symptoms of an allergic reaction.  Do not give any insulin products for the next 24 hours. -Monitor for allergic symptoms such as rash, wheezing, diarrhea, swelling, and vomiting for the next 24 hours.  If severe symptoms occur seek medical attention and call 911.  For less severe symptoms treat with Benadryl 4 teaspoonfuls every 6 hours and call the clinic. Continue to avoid NovoLog insulin.  Make sure that you are scheduled for a NovoLog insulin challenge in 2 weeks.  Remember to remain off all antihistamines 3 days prior and to bring your glucometer with you on the day of this appointment. -Starting tomorrow start Lantus Solostar 15 units once a day. Call your primary care physician to schedule an appointment and for further instructions. Also, call your pharmacy/primary care physician about getting needles for your Lantus Solostar.   Drug reaction Started to break out in pruritic rash and "knots" on her back 1 month after starting insulin - usually within 15 minutes of injection. Initially on Lantus 15U, then switched to [place] 20U, then added Aspart 7U BID. Stopped all insulin and symptoms  resolved except still has some itching. Denies history of hives. Diabetes is uncontrolled on oral meds. Patient discarded all of her insulin and does not have any at home. Denies any other changes in diet, meds or personal care products around this time.  Continue to avoid insulin. Will reach out to PCP and ask what insulin they want to try next. Depending on what they want to try - we will do skin testing and challenge to the drug. If patient has significant positive on skin testing and/or fails drug challenge, there have been some success with undergoing insulin desensitization which may be an option for her.  Most of the insulin preparations contain other inactive ingredients and the most common additives that are known to trigger immediate reactions are protamine, cresol and possibly latex.  Unfortunately, our office does not have access to protamine, cresol nor insulin to skin test for. If it's not possible for our office to obtain these materials, patient may need to be referred out to an academic allergy department to undergo these testing.  Get bloodwork as below to rule out other etiologies of the rash/itching.   Not well controlled asthma without complication Patient is a current smoker with not well controlled asthma symptoms. Currently on Advair 265mg 1 puff twice a day and albuterol prn.  Today's spirometry showed: No overt abnormalities noted given today's efforts. She had 10% improvement in FEV1 post-bronchodilator treatment with no clinical change. Daily controller medication(s): Advair 2579m 1 puff twice a day and rinse mouth after each use.  May use  albuterol rescue inhaler 2 puffs every 4 to 6 hours as needed for shortness of breath, chest tightness, coughing, and wheezing. May use albuterol rescue inhaler 2 puffs 5 to 15 minutes prior to strenuous physical activities. Monitor frequency of use.  Decrease smoking Asthma needs to be under good control prior to undergoing drug  challenges.    Adverse food reaction Pineapple causes itching. Continue to avoid pineapples for now. For mild symptoms you can take over the counter antihistamines such as Benadryl and monitor symptoms closely. If symptoms worsen or if you have severe symptoms including breathing issues, throat closure, significant swelling, whole body hives, severe diarrhea and vomiting, lightheadedness then seek immediate medical care. Check pineapple IgE. Assessment and Plan: Krista Sosa is a 70 y.o. female with: No problem-specific Assessment & Plan notes found for this encounter.  No follow-ups on file.  No orders of the defined types were placed in this encounter.  Lab Orders  No laboratory test(s) ordered today    Diagnostics: Spirometry:  Tracings reviewed. Her effort: {Blank single:19197::"Good reproducible efforts.","It was hard to get consistent efforts and there is a question as to whether this reflects a maximal maneuver.","Poor effort, data can not be interpreted."} FVC: ***L FEV1: ***L, ***% predicted FEV1/FVC ratio: ***% Interpretation: {Blank single:19197::"Spirometry consistent with mild obstructive disease","Spirometry consistent with moderate obstructive disease","Spirometry consistent with severe obstructive disease","Spirometry consistent with possible restrictive disease","Spirometry consistent with mixed obstructive and restrictive disease","Spirometry uninterpretable due to technique","Spirometry consistent with normal pattern","No overt abnormalities noted given today's efforts"}.  Please see scanned spirometry results for details.  Skin Testing: {Blank single:19197::"Select foods","Environmental allergy panel","Environmental allergy panel and select foods","Food allergy panel","None","Deferred due to recent antihistamines use"}. *** Results discussed with patient/family.   Medication List:  Current Outpatient Medications  Medication Sig Dispense Refill   albuterol (VENTOLIN  HFA) 108 (90 Base) MCG/ACT inhaler Inhale 1-2 puffs into the lungs every 4 (four) hours as needed for wheezing or shortness of breath. 1 each 0   atorvastatin (LIPITOR) 40 MG tablet Take 1 tablet (40 mg total) by mouth daily. 30 tablet 11   Continuous Blood Gluc Sensor (Hiddenite) MISC by Does not apply route.     diclofenac Sodium (VOLTAREN) 1 % GEL Apply topically 4 (four) times daily.     empagliflozin (JARDIANCE) 25 MG TABS tablet Take 1 tablet (25 mg total) by mouth daily. 90 tablet 3   famotidine (PEPCID) 20 MG tablet Take 20 mg by mouth 2 (two) times daily.     fluticasone-salmeterol (ADVAIR HFA) 230-21 MCG/ACT inhaler Inhale 2 puffs into the lungs 2 (two) times daily. with spacer and rinse mouth afterwards. 1 each 5   insulin degludec (TRESIBA FLEXTOUCH) 100 UNIT/ML FlexTouch Pen Inject 25 Units into the skin daily. 15 mL 3   insulin lispro (HUMALOG KWIKPEN) 100 UNIT/ML KwikPen Inject 15 Units into the skin 3 (three) times daily. 15 mL 3   olmesartan-hydrochlorothiazide (BENICAR HCT) 20-12.5 MG tablet Take 1 tablet by mouth daily. 90 tablet 3   sitaGLIPtin-metformin (JANUMET) 50-1000 MG tablet Take 1 tablet by mouth 2 (two) times daily with a meal. 180 tablet 3   Skin Protectants, Misc. (EUCERIN) cream Apply topically as needed for dry skin. 454 g 1   triamcinolone (KENALOG) 0.025 % ointment APPLY TOPICALLY TWICE DAILY AS DIRECTED (Patient not taking: Reported on 01/03/2021) 30 g 3   No current facility-administered medications for this visit.   Allergies: Allergies  Allergen Reactions   Pineapple    I reviewed her  past medical history, social history, family history, and environmental history and no significant changes have been reported from her previous visit.  Review of Systems  Constitutional:  Negative for appetite change, chills, fever and unexpected weight change.  HENT:  Negative for congestion and rhinorrhea.   Eyes:  Negative for itching.  Respiratory:   Positive for chest tightness and shortness of breath. Negative for cough and wheezing.   Cardiovascular:  Negative for chest pain.  Gastrointestinal:  Negative for abdominal pain.  Genitourinary:  Negative for difficulty urinating.  Skin:  Negative for rash.  Neurological:  Negative for headaches.   Objective: LMP 08/27/2007  There is no height or weight on file to calculate BMI. Physical Exam Vitals and nursing note reviewed.  Constitutional:      Appearance: Normal appearance. She is well-developed.  HENT:     Head: Normocephalic and atraumatic.     Right Ear: External ear normal.     Left Ear: External ear normal.     Nose: Nose normal.     Mouth/Throat:     Mouth: Mucous membranes are moist.     Pharynx: Oropharynx is clear.  Eyes:     Conjunctiva/sclera: Conjunctivae normal.  Cardiovascular:     Rate and Rhythm: Normal rate and regular rhythm.     Heart sounds: Normal heart sounds. No murmur heard.   No friction rub. No gallop.  Pulmonary:     Effort: Pulmonary effort is normal.     Breath sounds: Normal breath sounds. No wheezing, rhonchi or rales.  Abdominal:     Palpations: Abdomen is soft.  Musculoskeletal:     Cervical back: Neck supple.  Skin:    General: Skin is warm.     Findings: No rash.  Neurological:     Mental Status: She is alert and oriented to person, place, and time.  Psychiatric:        Behavior: Behavior normal.   Previous notes and tests were reviewed. The plan was reviewed with the patient/family, and all questions/concerned were addressed.  It was my pleasure to see Dennys today and participate in her care. Please feel free to contact me with any questions or concerns.  Sincerely,  Rexene Alberts, DO Allergy & Immunology  [facility] office: [facility] office: [phone]

## 2021-02-01 ENCOUNTER — Ambulatory Visit: Payer: Medicare Other | Admitting: Allergy

## 2021-02-01 DIAGNOSIS — T781XXD Other adverse food reactions, not elsewhere classified, subsequent encounter: Secondary | ICD-10-CM

## 2021-02-05 ENCOUNTER — Ambulatory Visit: Payer: Medicare Other | Admitting: Behavioral Health

## 2021-02-05 DIAGNOSIS — F419 Anxiety disorder, unspecified: Secondary | ICD-10-CM

## 2021-02-05 DIAGNOSIS — F331 Major depressive disorder, recurrent, moderate: Secondary | ICD-10-CM

## 2021-02-05 NOTE — BH Specialist Note (Signed)
Integrated Behavioral Health via Telemedicine Visit  02/05/2021 CHERYSH LUTEN [id number]  Number of [place] visits: 5/6 Session Start time: 1:00pm  Session End time: 1:30pm Total time: 30  Referring Provider: Dr. Lonia Skinner, MD Patient/Family location: Pt is home in private [facility] Provider location: [facility] Office All persons participating in visit: Pt & Clinician Types of Service: Individual psychotherapy  I connected with [name] and/or Wandra Arthurs Kizer's  self  via  Telephone or [employer]  (Video is Caregility application) and verified that I am speaking with the correct person using two identifiers. Discussed confidentiality:  5th visit  I discussed the limitations of telemedicine and the availability of in person appointments.  Discussed there is a possibility of technology failure and discussed alternative modes of communication if that failure occurs.  I discussed that engaging in this telemedicine visit, they consent to the provision of behavioral healthcare and the services will be billed under their insurance.  Patient and/or legal guardian expressed understanding and consented to Telemedicine visit:  5th visit  Presenting Concerns: Patient and/or family reports the following symptoms/concerns: reduced anx from Nephew's intervention w/Neighbors that cause issues; they are not bothering her Duration of problem: months; Severity of problem: mild  Patient and/or Family's Strengths/Protective Factors: Social connections, Social and Emotional competence, and Concrete supports in place (healthy food, safe environments, etc.)  Goals Addressed: Patient will:  Reduce symptoms of: anxiety, depression, and stress   Increase knowledge and/or ability of: stress reduction   Demonstrate ability to: Increase healthy adjustment to current life circumstances  Progress towards Goals: Ongoing  Interventions: Interventions  utilized:  Supportive Counseling Standardized Assessments completed:  screeners prn  Patient and/or Family Response: Pt receptive to call today & feeling more positive about the neighborhood. Family has pulled in to support & encourage Pt.   Assessment: Patient currently experiencing dec'd anx over the current situation as her Nephew has spoken w/the neighbor to prevent conflict.   Patient may benefit from cont'd ck-ins to maintain her sense of mental health wellness & safety.  Plan: Follow up with behavioral health clinician on : 2-3 wks on telehealth for 30 min ck-in Behavioral recommendations: None @ this time Referral(s): [place] (In Clinic)  I discussed the assessment and treatment plan with the patient and/or parent/guardian. They were provided an opportunity to ask questions and all were answered. They agreed with the plan and demonstrated an understanding of the instructions.   They were advised to call back or seek an in-person evaluation if the symptoms worsen or if the condition fails to improve as anticipated.  Donnetta Hutching, LMFT

## 2021-02-06 ENCOUNTER — Ambulatory Visit (INDEPENDENT_AMBULATORY_CARE_PROVIDER_SITE_OTHER): Payer: Medicare Other | Admitting: Allergy

## 2021-02-06 ENCOUNTER — Other Ambulatory Visit: Payer: Self-pay

## 2021-02-06 ENCOUNTER — Encounter: Payer: Self-pay | Admitting: Allergy

## 2021-02-06 VITALS — BP 134/84 | HR 88 | Temp 97.7°F | Resp 18 | Ht 66.0 in | Wt 182.0 lb

## 2021-02-06 DIAGNOSIS — J45909 Unspecified asthma, uncomplicated: Secondary | ICD-10-CM | POA: Diagnosis not present

## 2021-02-06 DIAGNOSIS — T781XXD Other adverse food reactions, not elsewhere classified, subsequent encounter: Secondary | ICD-10-CM

## 2021-02-06 DIAGNOSIS — J45901 Unspecified asthma with (acute) exacerbation: Secondary | ICD-10-CM

## 2021-02-06 DIAGNOSIS — Z720 Tobacco use: Secondary | ICD-10-CM

## 2021-02-06 DIAGNOSIS — J45998 Other asthma: Secondary | ICD-10-CM | POA: Diagnosis not present

## 2021-02-06 MED ORDER — ALBUTEROL SULFATE HFA 108 (90 BASE) MCG/ACT IN AERS: 1.0000 | INHALATION_SPRAY | RESPIRATORY_TRACT | 1 refills | Status: DC | PRN

## 2021-02-06 MED ORDER — TRELEGY ELLIPTA 200-62.5-25 MCG/INH IN AEPB: 1.0000 | INHALATION_SPRAY | Freq: Every day | RESPIRATORY_TRACT | 3 refills | Status: DC

## 2021-02-06 NOTE — Assessment & Plan Note (Addendum)
Past history - Patient is a current smoker. Tried Advair 251mg. Interim history - symptoms flaring despite being on Advair 2329m 2 puffs BID, using albuterol BID the last few weeks.   Today's spirometry showed: restriction with 36% improvement in FEV1 post bronchodilator treatment. Clinically feeling improved.  . Start prednisone taper. Prednisone '10mg'$  tablets - take 2 tablets for 4 days then 1 tablet on day 5.  o Make sure you monitor sugar while on prednisone.  . Daily controller medication(s): START Trelegy 20044m1 puff daily and rinse mouth after each use. Sample given. Demonstrated proper use.  . Hold Advair for now. . May use albuterol rescue inhaler 2 puffs every 4 to 6 hours as needed for shortness of breath, chest tightness, coughing, and wheezing. May use albuterol rescue inhaler 2 puffs 5 to 15 minutes prior to strenuous physical activities. Monitor frequency of use.  . SMarland Kitchenacer given and demonstrated proper use with inhaler. Patient understood technique and all questions/concerned were addressed.  . Get spirometry at next visit. . Encouraged smoking cessation.

## 2021-02-06 NOTE — Assessment & Plan Note (Addendum)
Past history - Pineapple causes itching. Pineapple IgE 0.36  Continue to avoid pineapples for now.  For mild symptoms you can take over the counter antihistamines such as Benadryl and monitor symptoms closely. If symptoms worsen or if you have severe symptoms including breathing issues, throat closure, significant swelling, whole body hives, severe diarrhea and vomiting, lightheadedness then seek immediate medical care.

## 2021-02-06 NOTE — Progress Notes (Signed)
Follow Up Note  RE: Krista Sosa MRN: [id number] DOB: [birth year] Date of Office Visit: 02/06/2021  Referring provider: Wayland Denis, MD Primary care provider: Wayland Denis, MD  Chief Complaint: Follow-up and Medication Refill  History of Present Illness: I had the pleasure of seeing Krista Sosa for a follow up visit at the [facility] on 02/06/2021. She is a 70 y.o. female, who is being followed for adverse drug reactions and asthma. Her previous allergy office visit was on 09/03/2020 with Krista Sosa. Today is a regular follow up visit.  Drug reaction Patient currently tolerating Humalog and Tresibe with no issues.   Asthma Currently on Advair 210mg 2 puffs BID x 1 month. She noticed some wheezing at night, coughing with white phlegm x 2 weeks.  Using albuterol twice a day with some benefit.   Denies any ER/urgent care visits or prednisone use since the last visit.  Smoking about 1/2 pack per day. Denies URI symptoms.  No spacer at home.   Assessment and Plan: BDeberahis a 70y.o. female with: Asthma with acute exacerbation Past history - Patient is a current smoker. Tried Advair 2536m. Interim history - symptoms flaring despite being on Advair 23065m2 puffs BID, using albuterol BID the last few weeks.  Today's spirometry showed: restriction with 36% improvement in FEV1 post bronchodilator treatment. Clinically feeling improved.  Start prednisone taper. Prednisone '10mg'$  tablets - take 2 tablets for 4 days then 1 tablet on day 5.  Make sure you monitor sugar while on prednisone.  Daily controller medication(s): START Trelegy 200m70m puff daily and rinse mouth after each use. Sample given. Demonstrated proper use.  Hold Advair for now. May use albuterol rescue inhaler 2 puffs every 4 to 6 hours as needed for shortness of breath, chest tightness, coughing, and wheezing. May use albuterol rescue inhaler 2 puffs 5 to 15 minutes prior to  strenuous physical activities. Monitor frequency of use.  Spacer given and demonstrated proper use with inhaler. Patient understood technique and all questions/concerned were addressed.  Get spirometry at next visit. Encouraged smoking cessation.  Adverse food reaction Past history - Pineapple causes itching. Pineapple IgE 0.36 Continue to avoid pineapples for now. For mild symptoms you can take over the counter antihistamines such as Benadryl and monitor symptoms closely. If symptoms worsen or if you have severe symptoms including breathing issues, throat closure, significant swelling, whole body hives, severe diarrhea and vomiting, lightheadedness then seek immediate medical care.  Return in about 2 months (around 04/08/2021).  Meds ordered this encounter  Medications   DISCONTD: albuterol (VENTOLIN HFA) 108 (90 Base) MCG/ACT inhaler    Sig: Inhale 1-2 puffs into the lungs every 4 (four) hours as needed for wheezing or shortness of breath (coughing fits). Use with spacer.    Dispense:  1 each    Refill:  1   Fluticasone-Umeclidin-Vilant (TRELEGY ELLIPTA) 200-62.5-25 MCG/INH AEPB    Sig: Inhale 1 puff into the lungs daily. Rinse mouth after each use.    Dispense:  60 each    Refill:  3   albuterol (VENTOLIN HFA) 108 (90 Base) MCG/ACT inhaler    Sig: Inhale 1-2 puffs into the lungs every 4 (four) hours as needed for wheezing or shortness of breath (coughing fits). Use with spacer.    Dispense:  1 each    Refill:  1    Lab Orders  No laboratory test(s) ordered today    Diagnostics: Spirometry:  Tracings  reviewed. Her effort: Good reproducible efforts. FVC: 1.48L FEV1: 1.03L, 50% predicted FEV1/FVC ratio: 70% Interpretation: Spirometry consistent with possible restrictive disease with 36% improvement in FEV1 post bronchodilator treatment. Clinically feeling improved.   Please see scanned spirometry results for details.  Medication List:  Current Outpatient Medications   Medication Sig Dispense Refill   Fluticasone-Umeclidin-Vilant (TRELEGY ELLIPTA) 200-62.5-25 MCG/INH AEPB Inhale 1 puff into the lungs daily. Rinse mouth after each use. 60 each 3   albuterol (VENTOLIN HFA) 108 (90 Base) MCG/ACT inhaler Inhale 1-2 puffs into the lungs every 4 (four) hours as needed for wheezing or shortness of breath (coughing fits). Use with spacer. 1 each 1   atorvastatin (LIPITOR) 40 MG tablet Take 1 tablet (40 mg total) by mouth daily. 30 tablet 11   Continuous Blood Gluc Sensor (Collins) MISC by Does not apply route.     diclofenac Sodium (VOLTAREN) 1 % GEL Apply topically 4 (four) times daily.     empagliflozin (JARDIANCE) 25 MG TABS tablet Take 1 tablet (25 mg total) by mouth daily. 90 tablet 3   famotidine (PEPCID) 20 MG tablet Take 20 mg by mouth 2 (two) times daily.     insulin degludec (TRESIBA FLEXTOUCH) 100 UNIT/ML FlexTouch Pen Inject 25 Units into the skin daily. 15 mL 3   insulin lispro (HUMALOG KWIKPEN) 100 UNIT/ML KwikPen Inject 15 Units into the skin 3 (three) times daily. 15 mL 3   olmesartan-hydrochlorothiazide (BENICAR HCT) 20-12.5 MG tablet Take 1 tablet by mouth daily. 90 tablet 3   sitaGLIPtin-metformin (JANUMET) 50-1000 MG tablet Take 1 tablet by mouth 2 (two) times daily with a meal. 180 tablet 3   Skin Protectants, Misc. (EUCERIN) cream Apply topically as needed for dry skin. 454 g 1   triamcinolone (KENALOG) 0.025 % ointment APPLY TOPICALLY TWICE DAILY AS DIRECTED (Patient not taking: Reported on 01/03/2021) 30 g 3   No current facility-administered medications for this visit.   Allergies: Allergies  Allergen Reactions   Pineapple    I reviewed her past medical history, social history, family history, and environmental history and no significant changes have been reported from her previous visit.  Review of Systems  Constitutional:  Negative for appetite change, chills, fever and unexpected weight change.  HENT:  Negative  for congestion and rhinorrhea.   Eyes:  Negative for itching.  Respiratory:  Positive for cough, chest tightness, shortness of breath and wheezing.   Cardiovascular:  Negative for chest pain.  Gastrointestinal:  Negative for abdominal pain.  Genitourinary:  Negative for difficulty urinating.  Skin:  Negative for rash.  Neurological:  Negative for headaches.   Objective: BP 134/84   Pulse 88   Temp 97.7 F (36.5 C) (Temporal)   Resp 18   Ht '5\' 6"'$  (1.676 m)   Wt 182 lb (82.6 kg)   LMP 08/27/2007   SpO2 97%   BMI 29.38 kg/m  Body mass index is 29.38 kg/m. Physical Exam Vitals and nursing note reviewed.  Constitutional:      Appearance: Normal appearance. She is well-developed.  HENT:     Head: Normocephalic and atraumatic.     Right Ear: External ear normal.     Left Ear: External ear normal.     Nose: Nose normal.     Mouth/Throat:     Mouth: Mucous membranes are moist.     Pharynx: Oropharynx is clear.  Eyes:     Conjunctiva/sclera: Conjunctivae normal.  Cardiovascular:     Rate and  Rhythm: Normal rate and regular rhythm.     Heart sounds: Normal heart sounds. No murmur heard.   No friction rub. No gallop.  Pulmonary:     Effort: Pulmonary effort is normal.     Breath sounds: Normal breath sounds. No wheezing, rhonchi or rales.  Abdominal:     Palpations: Abdomen is soft.  Musculoskeletal:     Cervical back: Neck supple.  Skin:    General: Skin is warm.     Findings: No rash.  Neurological:     Mental Status: She is alert and oriented to person, place, and time.  Psychiatric:        Behavior: Behavior normal.  Previous notes and tests were reviewed. The plan was reviewed with the patient/family, and all questions/concerned were addressed.  It was my pleasure to see Ieva today and participate in her care. Please feel free to contact me with any questions or concerns.  Sincerely,  Rexene Alberts, DO Allergy & Immunology  [facility] office: [place] office: [phone]

## 2021-02-06 NOTE — Patient Instructions (Addendum)
Asthma: Start prednisone taper. Prednisone '10mg'$  tablets - take 2 tablets for 4 days then 1 tablet on day 5.  Make sure you monitor your sugars as prednisone can make your sugars go high.  Daily controller medication(s): START Trelegy 263mg 1 puff daily and rinse mouth after each use. Sample given. Demonstrated proper use.  Hold Advair for now. May use albuterol rescue inhaler 2 puffs every 4 to 6 hours as needed for shortness of breath, chest tightness, coughing, and wheezing. May use albuterol rescue inhaler 2 puffs 5 to 15 minutes prior to strenuous physical activities. Monitor frequency of use.  Spacer given and demonstrated proper use with inhaler. Patient understood technique and all questions/concerned were addressed.  Asthma control goals:  Full participation in all desired activities (may need albuterol before activity) Albuterol use two times or less a week on average (not counting use with activity) Cough interfering with sleep two times or less a month Oral steroids no more than once a year No hospitalizations  Decrease smoking  Food: Continue to avoid pineapples for now. For mild symptoms you can take over the counter antihistamines such as Benadryl and monitor symptoms closely. If symptoms worsen or if you have severe symptoms including breathing issues, throat closure, significant swelling, whole body hives, severe diarrhea and vomiting, lightheadedness then seek immediate medical care.  Follow up in 2 months or sooner if needed.

## 2021-02-14 ENCOUNTER — Encounter: Payer: Medicare Other | Admitting: Internal Medicine

## 2021-02-21 ENCOUNTER — Ambulatory Visit (INDEPENDENT_AMBULATORY_CARE_PROVIDER_SITE_OTHER): Payer: Medicare Other | Admitting: Internal Medicine

## 2021-02-21 ENCOUNTER — Encounter: Payer: Self-pay | Admitting: Internal Medicine

## 2021-02-21 ENCOUNTER — Other Ambulatory Visit: Payer: Self-pay

## 2021-02-21 VITALS — BP 126/60 | HR 96 | Temp 98.0°F | Ht 66.0 in | Wt 186.1 lb

## 2021-02-21 DIAGNOSIS — E1165 Type 2 diabetes mellitus with hyperglycemia: Secondary | ICD-10-CM | POA: Diagnosis not present

## 2021-02-21 DIAGNOSIS — E1129 Type 2 diabetes mellitus with other diabetic kidney complication: Secondary | ICD-10-CM

## 2021-02-21 DIAGNOSIS — Z794 Long term (current) use of insulin: Secondary | ICD-10-CM | POA: Diagnosis not present

## 2021-02-21 DIAGNOSIS — Z0000 Encounter for general adult medical examination without abnormal findings: Secondary | ICD-10-CM

## 2021-02-21 DIAGNOSIS — IMO0002 Reserved for concepts with insufficient information to code with codable children: Secondary | ICD-10-CM

## 2021-02-21 DIAGNOSIS — R809 Proteinuria, unspecified: Secondary | ICD-10-CM

## 2021-02-21 DIAGNOSIS — I10 Essential (primary) hypertension: Secondary | ICD-10-CM

## 2021-02-21 LAB — POCT GLYCOSYLATED HEMOGLOBIN (HGB A1C): Hemoglobin A1C: 7.6 % — AB (ref 4.0–5.6)

## 2021-02-21 LAB — GLUCOSE, CAPILLARY: Glucose-Capillary: 100 mg/dL — ABNORMAL HIGH (ref 70–99)

## 2021-02-21 MED ORDER — INSULIN LISPRO (1 UNIT DIAL) 100 UNIT/ML (KWIKPEN): PEN_INJECTOR | SUBCUTANEOUS | 3 refills | Status: DC

## 2021-02-21 NOTE — Patient Instructions (Signed)
[name],   It was a pleasure seeing you today! Today we discussed your diabetes and blood pressure.   For your diabetes, we will decrease your morning Lispro to 12 units instead of 15 units. Please continue taking your afternoon insulin at 15 units. Please continue taking your nightly Tresiba 25 units, Janumet, and Jardiance. We will follow up your blood sugars in 3 months.   Today your blood pressure is well controlled! We will not make any medication changes.   I will also send a referral for a colonoscopy and send a Zoster vaccine to your pharmacy. Additionally, you will receive the flu shot at today's visit.   We will see you in three months!  Krista Mercury, MD

## 2021-02-22 NOTE — Assessment & Plan Note (Addendum)
Vitals with BMI 02/21/2021 02/21/2021 02/06/2021  Height - 5\' 6"  5\' 6"   Weight - 186 lbs 2 oz 182 lbs  BMI - 12.92 90.90  Systolic 301 499 692  Diastolic 60 67 84  Pulse 96 98 88   Patient with Hx of HTN presents to the clinic for follow up. She is currently taking Benicar 20-12.5 mg daily. She is compliant and has not experienced any side effects. HTN is well controlled at this time.  - Continue current regimen

## 2021-02-22 NOTE — Progress Notes (Signed)
CC: Diabetes Follow Up  HPI:  [name] is a 70 y.o. person, with a PMH noted below, who presents to the clinic for diabetes follow up. . To see the management of their acute and chronic conditions, please see the A&P note under the Encounters tab.   Past Medical History:  Diagnosis Date   Allergy    Asthma, chronic 06/09/2006   Bilateral cataracts 05/07/2016   s/p left cataract extraction 10/02/2016 and right cataract extraction 10/16/2016   Bronchiectasis without complication ([place]) 31/51/7616   Mild left lower lobe bronchiectasis seen on CT scan of the thorax in December 2019   Cataract    bil eyes   Essential hypertension 02/12/2018   Gastroesophageal reflux disease 08/13/2011   Glaucoma    Per patient report   Hx of adenomatous colonic polyps 11/16/2008   Hypertension    pt said she is supposed to take med but she is not taking it.   Hypertensive retinopathy, grade 2, bilateral 05/07/2016   Latent tuberculosis    dx 03/17/08 - tx with INH X 9 months   Lichenoid dermatitis 02/13/2012   Previously treated with Triamcinolone 0.025% cream Q8-12H PRN    Migraine headache 08/06/2018   Osteoarthritis, generalized 05/21/2006   Diffuse: cervical spine, right shoulder, left wrist Ineffective medications: Tylenol, ASA, Naprosyn, Ibuprofen, Meloxicam     Overweight (BMI 25.0-29.9) 02/13/2012   Perennial allergic rhinitis with seasonal variation 06/09/2006   Persistent microalbuminuria associated with type II diabetes mellitus ([place]) 05/21/2006   Sickle cell anemia ([place])    pt has sickle cell trait per pt   Sickle cell trait ([place]) 02/13/2012   Smoker    1/2ppd   Tobacco abuse disorder 06/09/2006   Tubular adenoma of rectum 11/16/2008   5 mm, excised endoscopically 11/16/2008, repeat colonoscopy due July 2015    Ulnar neuropathy of right upper extremity 02/14/2016   Urinary tract disease    JICP   Urticaria    Review of Systems:   Review of Systems  Constitutional:  Negative for  chills, diaphoresis, fever, malaise/fatigue and weight loss.  HENT:  Negative for ear pain.   Gastrointestinal:  Negative for abdominal pain, diarrhea, nausea and vomiting.  Neurological:  Negative for dizziness and headaches.    Physical Exam:  Vitals:   02/21/21 1340 02/21/21 1348  BP: (!) 151/67 126/60  Pulse: 98 96  Temp: 98 F (36.7 C)   TempSrc: Oral   SpO2: 99%   Weight: 186 lb 1.6 oz (84.4 kg)   Height: 5\' 6"  (1.676 m)    Physical Exam Constitutional:      Appearance: Normal appearance.  HENT:     Head: Normocephalic and atraumatic.  Eyes:     General:        Right eye: No discharge.        Left eye: No discharge.  Cardiovascular:     Rate and Rhythm: Normal rate and regular rhythm.     Pulses: Normal pulses.     Heart sounds: Normal heart sounds. No murmur heard.   No friction rub. No gallop.  Pulmonary:     Effort: Pulmonary effort is normal.     Breath sounds: Normal breath sounds. No wheezing, rhonchi or rales.  Neurological:     Mental Status: She is alert and oriented to person, place, and time.  Psychiatric:        Mood and Affect: Mood normal.        Behavior: Behavior normal.  Assessment & Plan:   See Encounters Tab for problem based charting.  Patient discussed with Dr. Philipp Ovens

## 2021-02-22 NOTE — Assessment & Plan Note (Addendum)
Patient presents to the clinic for a follow up of her diabetes. She is currently taking Tresiba 25U nightly, Lispro 15 U BID with 2 largest carb meals, Janumet 50-1000 mg daily, and Jardiance 25 mg daily. Her last A1c was 11.7. She has been compliant with her medications, and presents with a CGM. Unfortunately, we are missing approximately 2 weeks of data, and nightly readings today. Patient has been within goal 77% of the time, under goal 2% of the time, and over goal 21% of the time with an average reading of 149. Of her under goal events, she appears to have hypoglycemic episodes at breakfast. Her repeat A1c today is 7.6. We discussed changing her 15U short acting to 12U with her first meal, which she is agreeable to try.  - Continue current regimen - Follow up A1c in 3 months

## 2021-02-26 ENCOUNTER — Ambulatory Visit: Payer: Medicare Other | Admitting: Behavioral Health

## 2021-02-26 DIAGNOSIS — F419 Anxiety disorder, unspecified: Secondary | ICD-10-CM

## 2021-02-26 DIAGNOSIS — F331 Major depressive disorder, recurrent, moderate: Secondary | ICD-10-CM

## 2021-02-26 NOTE — BH Specialist Note (Signed)
Integrated Behavioral Health via Telemedicine Visit  02/26/2021 Krista Sosa [id number]  Number of [place] visits: 6/6 Session Start time: 1:30pm  Session End time: 2:00pm Total time: 30  Referring Provider: Dr. Wayland Denis, MD Patient/Family location: Pt is in her car [facility] Provider location: [facility] Office All persons participating in visit: Pt & Clinician Types of Service: Individual psychotherapy  I connected with [name] and/or Wandra Arthurs Odoherty's  self  via  Telephone or [employer]  (Video is Caregility application) and verified that I am speaking with the correct person using two identifiers. Discussed confidentiality:  6th visit  I discussed the limitations of telemedicine and the availability of in person appointments.  Discussed there is a possibility of technology failure and discussed alternative modes of communication if that failure occurs.  I discussed that engaging in this telemedicine visit, they consent to the provision of behavioral healthcare and the services will be billed under their insurance.  Patient and/or legal guardian expressed understanding and consented to Telemedicine visit:  6th visit  Presenting Concerns: Patient and/or family reports the following symptoms/concerns: elevated anx/dep over her neighbor's brandishing a gun Duration of problem: over a month; Severity of problem: moderate  Patient and/or Family's Strengths/Protective Factors: Social connections, Social and Emotional competence, Concrete supports in place (healthy food, safe environments, etc.), and Sense of purpose  Goals Addressed: Patient will:  Reduce symptoms of: anxiety, depression, and stress   Increase knowledge and/or ability of: coping skills and stress reduction   Demonstrate ability to: Increase healthy adjustment to current life circumstances and Increase adequate support systems for patient/family  Progress  towards Goals: Ongoing  Interventions: Interventions utilized:  Behavioral Activation Standardized Assessments completed:  screeners prn  Patient and/or Family Response: Pt receptive to call & requests future visits  Assessment: Patient currently experiencing elevated anx & worry over neighbor who carries a gun around the area intimidating ppl. Negotiated Pt willingness to call the Police & make a Report.  Pt is hesitant, but extremely stressed by this man's beh. Pt even spoke w/the Mother of this young man & the Mother reported he has mental health issues. There are 2 other Sons in the home w/addt'l issues.   Patient may benefit from relief of the stress this current situation is causing. Provided Pt w/monologue she can use on a phone call if she chooses.  Plan: Follow up with behavioral health clinician on : 2-3 wks on telehealth for 30 min Behavioral recommendations: Suggestions to make a Report to the Police as Pt feels able to reduce her anx/dep & to promote her feelings of safety. Referral(s): [place] (In Clinic) & Resources of the [employer]  I discussed the assessment and treatment plan with the patient and/or parent/guardian. They were provided an opportunity to ask questions and all were answered. They agreed with the plan and demonstrated an understanding of the instructions.   They were advised to call back or seek an in-person evaluation if the symptoms worsen or if the condition fails to improve as anticipated.  Donnetta Hutching, LMFT

## 2021-03-12 ENCOUNTER — Ambulatory Visit: Payer: Medicare Other | Admitting: Behavioral Health

## 2021-03-12 DIAGNOSIS — F419 Anxiety disorder, unspecified: Secondary | ICD-10-CM

## 2021-03-12 DIAGNOSIS — F331 Major depressive disorder, recurrent, moderate: Secondary | ICD-10-CM

## 2021-03-12 NOTE — BH Specialist Note (Signed)
Integrated Behavioral Health via Telemedicine Visit  03/12/2021 YASHA TIBBETT [id number]  Number of [place] visits: 7 Session Start time: 2:30pm  Session End time: 3:00pm Total time: 30  Referring Provider: Dr. Wayland Denis, MD Patient/Family location: Pt is home in private [facility] Provider location: [facility] Office All persons participating in visit: Pt & Clinician Types of Service: Individual psychotherapy  I connected with [name] and/or Wandra Arthurs Mcgough's  self  via  Telephone or [employer]  (Video is Caregility application) and verified that I am speaking with the correct person using two identifiers. Discussed confidentiality:  7th visit  I discussed the limitations of telemedicine and the availability of in person appointments.  Discussed there is a possibility of technology failure and discussed alternative modes of communication if that failure occurs.  I discussed that engaging in this telemedicine visit, they consent to the provision of behavioral healthcare and the services will be billed under their insurance.  Patient and/or legal guardian expressed understanding and consented to Telemedicine visit:  7th visit  Presenting Concerns: Patient and/or family reports the following symptoms/concerns: elevated anx/dep due to neighborhood situation & ppl brandishing weapons Duration of problem: months; Severity of problem: moderate  Patient and/or Family's Strengths/Protective Factors: Social connections, Social and Emotional competence, Concrete supports in place (healthy food, safe environments, etc.), and Sense of purpose  Goals Addressed: Patient will:  Reduce symptoms of: anxiety, depression, and stress   Increase knowledge and/or ability of: coping skills and stress reduction   Demonstrate ability to: Increase healthy adjustment to current life circumstances  Progress towards  Goals: Ongoing  Interventions: Interventions utilized:  Solution-Focused Strategies Standardized Assessments completed:  screeners prn  Patient and/or Family Response: Pt receptive to call today & requests future appts  Assessment: Patient currently experiencing elevated anx/dep due to man who walks around w/a gun in the neighborhood.   Patient may benefit from cont'd support for circumstances & eoncouragement to find new Apt location.  Plan: Follow up with behavioral health clinician on : 2-3 wks for 30 min on telehealth Behavioral recommendations: Reach out to Niece to assist you w/new residence Referral(s): [place] (In Clinic)  I discussed the assessment and treatment plan with the patient and/or parent/guardian. They were provided an opportunity to ask questions and all were answered. They agreed with the plan and demonstrated an understanding of the instructions.   They were advised to call back or seek an in-person evaluation if the symptoms worsen or if the condition fails to improve as anticipated.  Donnetta Hutching, LMFT

## 2021-03-13 ENCOUNTER — Telehealth: Payer: Self-pay

## 2021-03-13 NOTE — Telephone Encounter (Signed)
Received TC from patient who states she had a wellness visit this year and wants to know what date she had the visit.  Per chart review, patient had a Medicare AWV w/ Lauren,RN on 09/05/20.  She was given this information and verbalized appreciation. SChaplin, RN,BSN

## 2021-03-25 ENCOUNTER — Other Ambulatory Visit: Payer: Self-pay | Admitting: Internal Medicine

## 2021-03-25 DIAGNOSIS — Z1231 Encounter for screening mammogram for malignant neoplasm of breast: Secondary | ICD-10-CM

## 2021-04-02 ENCOUNTER — Ambulatory Visit: Payer: Medicare Other | Admitting: Behavioral Health

## 2021-04-02 DIAGNOSIS — F419 Anxiety disorder, unspecified: Secondary | ICD-10-CM

## 2021-04-02 DIAGNOSIS — F331 Major depressive disorder, recurrent, moderate: Secondary | ICD-10-CM

## 2021-04-02 NOTE — BH Specialist Note (Signed)
Integrated Behavioral Health via Telemedicine Visit  04/02/2021 Krista Sosa [id number]  Number of [place] visits: 8 Session Start time: 1:40pm  Session End time: 2:00pm Total time: 20  Referring Provider: Dr. Wayland Denis, MD Patient/Family location: Pt is home in private [facility] Provider location: [facility] Office All persons participating in visit: Pt & Clinician Types of Service: Individual psychotherapy  I connected with Krista Sosa and/or Krista Sosa's  self  via  Telephone or [employer]  (Video is Caregility application) and verified that I am speaking with the correct person using two identifiers. Discussed confidentiality:  8th visit  I discussed the limitations of telemedicine and the availability of in person appointments.  Discussed there is a possibility of technology failure and discussed alternative modes of communication if that failure occurs.  I discussed that engaging in this telemedicine visit, they consent to the provision of behavioral healthcare and the services will be billed under their insurance.  Patient and/or legal guardian expressed understanding and consented to Telemedicine visit:  8th visit  Presenting Concerns: Patient and/or family reports the following symptoms/concerns: reduced concern for neighborhood issues by staying to herself. Duration of problem: months; Severity of problem: mild  Patient and/or Family's Strengths/Protective Factors: Social and Emotional competence, Concrete supports in place (healthy food, safe environments, etc.), and Sense of purpose  Goals Addressed: Patient will:  Reduce symptoms of: anxiety, depression, and stress   Increase knowledge and/or ability of: healthy habits and stress reduction   Demonstrate ability to: Increase healthy adjustment to current life circumstances  Progress towards Goals: Ongoing  Interventions: Interventions utilized:  Behavioral  Activation Standardized Assessments completed:  screeners prn  Patient and/or Family Response: Pt receptive to call & requests future appts  Assessment: Patient currently experiencing elevated sense of well-being as she initiates walking w/a former Co-Worker @ the [employer]. This friend is former Nurse, mental health.  Patient may benefit from cont'd psychotherapy sessions prn.  Plan: Follow up with behavioral health clinician on : 2-3 wks on telehealth for 30 min Behavioral recommendations: Be healthy on your walk next week! Enjoy your Co-Workers from previous job. Referral(s): [place] (In Clinic)  I discussed the assessment and treatment plan with the patient and/or parent/guardian. They were provided an opportunity to ask questions and all were answered. They agreed with the plan and demonstrated an understanding of the instructions.   They were advised to call back or seek an in-person evaluation if the symptoms worsen or if the condition fails to improve as anticipated.  Donnetta Hutching, LMFT

## 2021-04-09 DIAGNOSIS — Z794 Long term (current) use of insulin: Secondary | ICD-10-CM | POA: Diagnosis not present

## 2021-04-09 DIAGNOSIS — E119 Type 2 diabetes mellitus without complications: Secondary | ICD-10-CM | POA: Diagnosis not present

## 2021-04-24 DIAGNOSIS — Z961 Presence of intraocular lens: Secondary | ICD-10-CM | POA: Diagnosis not present

## 2021-04-24 LAB — HM DIABETES EYE EXAM

## 2021-04-29 ENCOUNTER — Ambulatory Visit: Payer: Medicare Other | Admitting: Behavioral Health

## 2021-04-29 DIAGNOSIS — F331 Major depressive disorder, recurrent, moderate: Secondary | ICD-10-CM

## 2021-04-29 DIAGNOSIS — F419 Anxiety disorder, unspecified: Secondary | ICD-10-CM
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
## 2021-04-30 ENCOUNTER — Telehealth: Payer: Self-pay | Admitting: Behavioral Health

## 2021-04-30 ENCOUNTER — Ambulatory Visit: Payer: Medicare Other | Admitting: Behavioral Health

## 2021-04-30 ENCOUNTER — Ambulatory Visit
Admission: RE | Admit: 2021-04-30 | Discharge: 2021-04-30 | Disposition: A | Discharge status: Home / Self Care | Payer: Medicare Other | Source: Ambulatory Visit | Attending: Internal Medicine | Admitting: Internal Medicine

## 2021-04-30 DIAGNOSIS — Z1231 Encounter for screening mammogram for malignant neoplasm of breast: Secondary | ICD-10-CM | POA: Diagnosis not present
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
## 2021-05-02 ENCOUNTER — Other Ambulatory Visit: Payer: Self-pay | Admitting: Internal Medicine

## 2021-05-02 DIAGNOSIS — R928 Other abnormal and inconclusive findings on diagnostic imaging of breast: Secondary | ICD-10-CM

## 2021-05-22 ENCOUNTER — Ambulatory Visit: Payer: Medicare Other | Admitting: Behavioral Health

## 2021-05-22 DIAGNOSIS — F419 Anxiety disorder, unspecified: Secondary | ICD-10-CM

## 2021-05-22 DIAGNOSIS — F331 Major depressive disorder, recurrent, moderate: Secondary | ICD-10-CM
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
## 2021-05-29 ENCOUNTER — Encounter: Payer: Self-pay | Admitting: Internal Medicine

## 2021-05-29 ENCOUNTER — Ambulatory Visit (INDEPENDENT_AMBULATORY_CARE_PROVIDER_SITE_OTHER): Payer: Commercial Managed Care - HMO | Admitting: Internal Medicine

## 2021-05-29 VITALS — BP 134/62 | HR 106 | Wt 187.5 lb

## 2021-05-29 DIAGNOSIS — M1711 Unilateral primary osteoarthritis, right knee: Secondary | ICD-10-CM | POA: Diagnosis not present

## 2021-05-29 MED ORDER — ACETAMINOPHEN 500 MG PO TABS: 1000.0000 mg | ORAL_TABLET | Freq: Three times a day (TID) | ORAL | 0 refills | Status: AC

## 2021-05-29 NOTE — Assessment & Plan Note (Signed)
Patient presents for R knee pain, has history of generalized osteoarthritis. Patient reports 1 month ago she woke up with new right knee pain. Had been walking a lot the day prior. Denies swelling, erythema, fever, chills, injury to the knee. Tried Motrin 800mg  once a day and muscle relaxers nightly for pain without relief. Appreciates knee cracking sensation when she moves the knee. Hurts at rest and worsens upon standing and walking. Has limited her mobility. Does not walk with assistive device currently.  On MSK exam, Right knee: No overlying erythema.  No swelling compared to left.  No knee joint effusion identified.  No quadriceps tendon or patellar tendon tenderness.  TTP of medial joint line.  No anterior or posterior laxity.  No varus or valgus laxity.  McMurry test negative.  Likely flare of osteoarthritis though medial meniscal injury on differential. Reassuring that McMurray negative and pain still able to ambulate. Discussed scheduled tylenol and PT referral. If patient continues to have pain could consider MRI in the future.    Plan: -1,000mg  tylenol TID -ambulatory referral to PT

## 2021-05-29 NOTE — Patient Instructions (Signed)
Thank you, Ms.[name] for allowing [place] to provide your care today. Today we discussed:  Right knee pain: Please take Tylenol 1000 mg 3 times a day (take two 500 mg tablets 3 times a day).  You will receive a call to set up physical therapy.   My Chart Access: [email or web address]  Please follow-up in 4 months.  Please make sure to arrive 15 minutes prior to your next appointment. If you arrive late, you may be asked to reschedule.    We look forward to seeing you next time. Please call our clinic at [phone] if you have any questions or concerns. The best time to call is Monday-Friday from 9am-4pm, but there is someone available 24/7. If after hours or the weekend, call the main hospital number and ask for the Internal Medicine Resident On-Call. If you need medication refills, please notify your pharmacy one week in advance and they will send [place] a request.   Thank you for letting us take part in your care. Wishing you the best!  Wayland Denis, MD 05/29/2021, 11:14 AM IM Resident, PGY-1

## 2021-05-29 NOTE — Progress Notes (Signed)
CC: R leg pain  HPI:  [name] is a 71 y.o. female with a past medical history stated below and presents today for R leg pain. Please see problem based assessment and plan for additional details.  Past Medical History:  Diagnosis Date   Allergy    Asthma, chronic 06/09/2006   Bilateral cataracts 05/07/2016   s/p left cataract extraction 10/02/2016 and right cataract extraction 10/16/2016   Bronchiectasis without complication ([place]) 50/27/7412   Mild left lower lobe bronchiectasis seen on CT scan of the thorax in December 2019   Cataract    bil eyes   Essential hypertension 02/12/2018   Gastroesophageal reflux disease 08/13/2011   Glaucoma    Per patient report   Hx of adenomatous colonic polyps 11/16/2008   Hypertension    pt said she is supposed to take med but she is not taking it.   Hypertensive retinopathy, grade 2, bilateral 05/07/2016   Latent tuberculosis    dx 03/17/08 - tx with INH X 9 months   Lichenoid dermatitis 02/13/2012   Previously treated with Triamcinolone 0.025% cream Q8-12H PRN    Migraine headache 08/06/2018   Osteoarthritis, generalized 05/21/2006   Diffuse: cervical spine, right shoulder, left wrist Ineffective medications: Tylenol, ASA, Naprosyn, Ibuprofen, Meloxicam     Overweight (BMI 25.0-29.9) 02/13/2012   Perennial allergic rhinitis with seasonal variation 06/09/2006   Persistent microalbuminuria associated with type II diabetes mellitus ([place]) 05/21/2006   Sickle cell anemia ([place])    pt has sickle cell trait per pt   Sickle cell trait ([place]) 02/13/2012   Smoker    1/2ppd   Tobacco abuse disorder 06/09/2006   Tubular adenoma of rectum 11/16/2008   5 mm, excised endoscopically 11/16/2008, repeat colonoscopy due July 2015    Ulnar neuropathy of right upper extremity 02/14/2016   Urinary tract disease    JICP   Urticaria     Current Outpatient Medications on File Prior to Visit  Medication Sig Dispense Refill   albuterol (VENTOLIN HFA) 108 (90  Base) MCG/ACT inhaler Inhale 1-2 puffs into the lungs every 4 (four) hours as needed for wheezing or shortness of breath (coughing fits). Use with spacer. 1 each 1   atorvastatin (LIPITOR) 40 MG tablet Take 1 tablet (40 mg total) by mouth daily. 30 tablet 11   Continuous Blood Gluc Sensor ([place]) MISC by Does not apply route.     diclofenac Sodium (VOLTAREN) 1 % GEL Apply topically 4 (four) times daily.     empagliflozin (JARDIANCE) 25 MG TABS tablet Take 1 tablet (25 mg total) by mouth daily. 90 tablet 3   famotidine (PEPCID) 20 MG tablet Take 20 mg by mouth 2 (two) times daily.     Fluticasone-Umeclidin-Vilant (TRELEGY ELLIPTA) 200-62.5-25 MCG/INH AEPB Inhale 1 puff into the lungs daily. Rinse mouth after each use. 60 each 3   insulin degludec (TRESIBA FLEXTOUCH) 100 UNIT/ML FlexTouch Pen Inject 25 Units into the skin daily. 15 mL 3   insulin lispro (HUMALOG KWIKPEN) 100 UNIT/ML KwikPen Inject 12 units in the morning and 15 units in the afternoon with largest meals 15 mL 3   olmesartan-hydrochlorothiazide (BENICAR HCT) 20-12.5 MG tablet Take 1 tablet by mouth daily. 90 tablet 3   sitaGLIPtin-metformin (JANUMET) 50-1000 MG tablet Take 1 tablet by mouth 2 (two) times daily with a meal. 180 tablet 3   Skin Protectants, Misc. (EUCERIN) cream Apply topically as needed for dry skin. 454 g 1   triamcinolone (KENALOG) 0.025 %  ointment APPLY TOPICALLY TWICE DAILY AS DIRECTED (Patient not taking: Reported on 01/03/2021) 30 g 3   [DISCONTINUED] Triamcinolone Acetonide (TRIAMCINOLONE 0.1 % CREAM : EUCERIN) CREA Apply 1 application topically 2 (two) times daily as needed. 454 each 3   No current facility-administered medications on file prior to visit.    Family History  Problem Relation Age of Onset   Diabetes Mother    Heart disease Mother 49   Colon cancer Mother    Cancer Mother        Colon   Cancer Father        Lung   Sickle cell anemia Sister    Sickle cell anemia  Brother    Diabetes Sister    Brain cancer Sister    Cancer Sister        brain   Lung cancer Brother    Cancer Brother        Lung   Healthy Sister    Healthy Sister    Healthy Sister    Healthy Daughter    Healthy Son    Healthy Daughter    Healthy Daughter    Healthy Son    Wilson's disease Maternal Uncle    Breast cancer Maternal Aunt    Breast cancer Other    Esophageal cancer Neg Hx    Rectal cancer Neg Hx    Stomach cancer Neg Hx     Social History   Tobacco Use   Smoking status: Every Day    Packs/day: 0.50    Years: 25.00    Pack years: 12.50    Types: Cigarettes   Smokeless tobacco: Never   Tobacco comments:    .5 ppd  Vaping Use   Vaping Use: Never used  Substance Use Topics   Alcohol use: Yes    Alcohol/week: 0.0 standard drinks    Comment: occasionally   Drug use: No    Comment: positive marijuana metabolite in 2010  Walks without assistive devices.   Review of Systems: ROS negative except for what is noted on the assessment and plan.  Vitals:   05/29/21 1014  BP: 134/62  Pulse: (!) 106  SpO2: 100%  Weight: 187 lb 8 oz (85 kg)     Physical Exam: General: Well appearing obese african [place] female, NAD HENT: normocephalic, atraumatic EYES: conjunctiva non-erythematous, no scleral icterus CV: regular rate, normal rhythm, no murmurs, rubs, gallops. Pulmonary: normal work of breathing on RA, lungs clear to auscultation, no rales, wheezes, rhonchi Abdominal: non-distended, soft, non-tender to palpation, normal BS Skin: Warm and dry, no rashes or lesions Neurological: MS: awake, alert and oriented x3, normal speech and fund of knowledge Motor: moves all extremities antigravity MSK: Right knee: No overlying erythema.  No swelling compared to left.  No knee joint effusion identified.  No quadriceps tendon or patellar tendon tenderness.  TTP of medial joint line.  No anterior posterior laxity.  No varus or valgus laxity.  McMurry test  negative. Psych: normal affect   Assessment & Plan:   See Encounters Tab for problem based charting.  Patient discussed with Dr. Dionicia Abler, M.D. [facility], PGY-1 Pager: [phone] Date 05/29/2021 Time 11:09 AM

## 2021-06-03 NOTE — Progress Notes (Signed)
Internal Medicine Clinic Attending ? ?Case discussed with Dr. Zinoviev  At the time of the visit.  We reviewed the resident?s history and exam and pertinent patient test results.  I agree with the assessment, diagnosis, and plan of care documented in the resident?s note.  ?

## 2021-06-06 ENCOUNTER — Ambulatory Visit: Payer: Commercial Managed Care - HMO

## 2021-06-06 ENCOUNTER — Ambulatory Visit
Admission: RE | Admit: 2021-06-06 | Discharge: 2021-06-06 | Disposition: A | Discharge status: Home / Self Care | Payer: Medicaid Other | Source: Ambulatory Visit | Attending: Internal Medicine | Admitting: Internal Medicine

## 2021-06-06 DIAGNOSIS — R928 Other abnormal and inconclusive findings on diagnostic imaging of breast: Secondary | ICD-10-CM

## 2021-06-11 ENCOUNTER — Other Ambulatory Visit: Payer: Medicare Other

## 2021-06-17 ENCOUNTER — Ambulatory Visit: Payer: Medicare Other | Admitting: Behavioral Health

## 2021-06-17 DIAGNOSIS — F419 Anxiety disorder, unspecified: Secondary | ICD-10-CM

## 2021-06-17 DIAGNOSIS — F331 Major depressive disorder, recurrent, moderate: Secondary | ICD-10-CM
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
## 2021-06-18 ENCOUNTER — Ambulatory Visit: Payer: Commercial Managed Care - HMO | Attending: Internal Medicine

## 2021-06-18 NOTE — Therapy (Deleted)
OUTPATIENT PHYSICAL THERAPY LOWER EXTREMITY EVALUATION   Patient Name: Krista Sosa MRN: [id number] DOB:[birth year], 71 y.o., female Today's Date: 06/18/2021    Past Medical History:  Diagnosis Date   Allergy    Asthma, chronic 06/09/2006   Bilateral cataracts 05/07/2016   s/p left cataract extraction 10/02/2016 and right cataract extraction 10/16/2016   Bronchiectasis without complication ([place]) 42/70/6237   Mild left lower lobe bronchiectasis seen on CT scan of the thorax in December 2019   Cataract    bil eyes   Essential hypertension 02/12/2018   Gastroesophageal reflux disease 08/13/2011   Glaucoma    Per patient report   Hx of adenomatous colonic polyps 11/16/2008   Hypertension    pt said she is supposed to take med but she is not taking it.   Hypertensive retinopathy, grade 2, bilateral 05/07/2016   Latent tuberculosis    dx 03/17/08 - tx with INH X 9 months   Lichenoid dermatitis 02/13/2012   Previously treated with Triamcinolone 0.025% cream Q8-12H PRN    Migraine headache 08/06/2018   Osteoarthritis, generalized 05/21/2006   Diffuse: cervical spine, right shoulder, left wrist Ineffective medications: Tylenol, ASA, Naprosyn, Ibuprofen, Meloxicam     Overweight (BMI 25.0-29.9) 02/13/2012   Perennial allergic rhinitis with seasonal variation 06/09/2006   Persistent microalbuminuria associated with type II diabetes mellitus ([place]) 05/21/2006   Sickle cell anemia ([place])    pt has sickle cell trait per pt   Sickle cell trait ([place]) 02/13/2012   Smoker    1/2ppd   Tobacco abuse disorder 06/09/2006   Tubular adenoma of rectum 11/16/2008   5 mm, excised endoscopically 11/16/2008, repeat colonoscopy due July 2015    Ulnar neuropathy of right upper extremity 02/14/2016   Urinary tract disease    JICP   Urticaria    Past Surgical History:  Procedure Laterality Date   APPENDECTOMY     CATARACT EXTRACTION, BILATERAL Bilateral    OS 10/10/2016, OD 10/16/2016   COLONOSCOPY  11/16/08    EXCISION OF KELOID N/A 09/19/2020   Procedure: EXCISION OF CHRONICALLY INFECTED UPPER BACK CYST;  Surgeon: Coralie Keens, MD;  Location: [facility];  Service: General;  Laterality: N/A;   FOOT SURGERY  07/31/2008   Exostectomy right hallus   TONSILLECTOMY     TUBAL LIGATION     Patient Active Problem List   Diagnosis Date Noted   Osteoarthritis of right knee 05/29/2021   Drug reaction 08/07/2020   Urticaria 08/07/2020   Adverse food reaction 08/07/2020   Asthma with acute exacerbation 08/07/2020   Inflamed epidermoid cyst of skin 07/02/2020   Hives 03/26/2020   Goiter 05/30/2019   Split ear lobe 01/04/2019   Radiculopathy affecting upper extremity 01/02/2019   Migraine headache 08/06/2018   Xerosis of skin 08/06/2018   Bronchiectasis without complication ([place]) 62/83/1517   Chronic cough 05/05/2018   Essential hypertension 02/12/2018   Major depressive disorder, recurrent episode, mild with anxious distress ([place]) 06/05/2017   Hypertensive retinopathy, grade 2, bilateral 05/07/2016   Healthcare maintenance 61/60/7371   Lichenoid dermatitis 02/13/2012   Sickle cell trait ([place]) 02/13/2012   Overweight (BMI 25.0-29.9) 02/13/2012   Gastroesophageal reflux disease without esophagitis 08/13/2011   Hx of adenomatous colonic polyps 11/16/2008   Chest pain 01/25/2008   Tobacco abuse disorder 06/09/2006   Perennial allergic rhinitis with seasonal variation 06/09/2006   Mild intermittent asthma 06/09/2006   Uncontrolled type 2 diabetes mellitus with microalbuminuria, with long-term current use of insulin 05/21/2006  Osteoarthritis, generalized 05/21/2006    PCP: Wayland Denis, MD  REFERRING PROVIDER: Angelica Pou, MD  REFERRING DIAG: M17.11 (ICD-10-CM) - Osteoarthritis of right knee, unspecified osteoarthritis type   THERAPY DIAG:  R knee pain  ONSET DATE: 05/29/2021   SUBJECTIVE:   SUBJECTIVE STATEMENT: ***  PERTINENT HISTORY: Patient  presents for R knee pain, has history of generalized osteoarthritis. Patient reports 1 month ago she woke up with new right knee pain. Had been walking a lot the day prior. Denies swelling, erythema, fever, chills, injury to the knee. Tried Motrin 800mg  once a day and muscle relaxers nightly for pain without relief. Appreciates knee cracking sensation when she moves the knee. Hurts at rest and worsens upon standing and walking. Has limited her mobility. Does not walk with assistive device currently.   On MSK exam, Right knee: No overlying erythema.  No swelling compared to left.  No knee joint effusion identified.  No quadriceps tendon or patellar tendon tenderness.  TTP of medial joint line.  No anterior or posterior laxity.  No varus or valgus laxity.  McMurry test negative.   Likely flare of osteoarthritis though medial meniscal injury on differential. Reassuring that McMurray negative and pain still able to ambulate. Discussed scheduled tylenol and PT referral. If patient continues to have pain could consider MRI in the future.     Plan: -1,000mg  tylenol TID  PAIN:  Are you having pain? {yes/no:20286} NPRS scale: ***/10 Pain location: *** Pain orientation: {Pain Orientation:25161}  PAIN TYPE: {type:313116} Pain description: {PAIN DESCRIPTION:21022940}  Aggravating factors: *** Relieving factors: ***  PRECAUTIONS: None  WEIGHT BEARING RESTRICTIONS No  FALLS:  Has patient fallen in last 6 months? {yes/no:20286}, Number of falls: ***  LIVING ENVIRONMENT: Lives with: {OPRC lives with:25569::"lives with their family"} Lives in: {Lives in:25570} Stairs: {yes/no:20286}; {Stairs:24000} Has following equipment at home: {Assistive devices:23999}  OCCUPATION: ***  PLOF: Independent  PATIENT GOALS ***   OBJECTIVE:   DIAGNOSTIC FINDINGS: Patient presents for R knee pain, has history of generalized osteoarthritis. Patient reports 1 month ago she woke up with new right knee pain. Had  been walking a lot the day prior. Denies swelling, erythema, fever, chills, injury to the knee. Tried Motrin 800mg  once a day and muscle relaxers nightly for pain without relief. Appreciates knee cracking sensation when she moves the knee. Hurts at rest and worsens upon standing and walking. Has limited her mobility. Does not walk with assistive device currently.   On MSK exam, Right knee: No overlying erythema.  No swelling compared to left.  No knee joint effusion identified.  No quadriceps tendon or patellar tendon tenderness.  TTP of medial joint line.  No anterior or posterior laxity.  No varus or valgus laxity.  McMurry test negative.   Likely flare of osteoarthritis though medial meniscal injury on differential. Reassuring that McMurray negative and pain still able to ambulate. Discussed scheduled tylenol and PT referral. If patient continues to have pain could consider MRI in the future.     Plan: -1,000mg  tylenol TID  PATIENT SURVEYS:  FOTO ***  COGNITION:  Overall cognitive status: Within functional limits for tasks assessed     SENSATION:  Light touch: {intact/deficits:24005}  Stereognosis: {intact/deficits:24005}  Hot/Cold: {intact/deficits:24005}  Proprioception: {intact/deficits:24005}  MUSCLE LENGTH: Hamstrings: Right *** deg; Left *** deg Thomas test: Right *** deg; Left *** deg  POSTURE:  ***  PALPATION: ***  LE AROM/PROM:  A/PROM Right 06/18/2021 Left 06/18/2021  Hip flexion    Hip extension  Hip abduction    Hip adduction    Hip internal rotation    Hip external rotation    Knee flexion    Knee extension    Ankle dorsiflexion    Ankle plantarflexion    Ankle inversion    Ankle eversion     (Blank rows = not tested)  LE MMT:  MMT Right 06/18/2021 Left 06/18/2021  Hip flexion    Hip extension    Hip abduction    Hip adduction    Hip internal rotation    Hip external rotation    Knee flexion    Knee extension    Ankle dorsiflexion    Ankle  plantarflexion    Ankle inversion    Ankle eversion     (Blank rows = not tested)  LOWER EXTREMITY SPECIAL TESTS:  {LEspecialtests:26242}  FUNCTIONAL TESTS:  {Functional tests:24029}  GAIT: Distance walked: *** Assistive device utilized: {Assistive devices:23999} Level of assistance: {Levels of assistance:24026} Comments: ***    TODAY'S TREATMENT: ***   PATIENT EDUCATION:  Education details: Discussed eval findings, rehab rationale and POC and patient is in agreement  Person educated: Patient Education method: Explanation, Demonstration, and Handouts Education comprehension: verbalized understanding, returned demonstration, verbal cues required, tactile cues required, and needs further education   HOME EXERCISE PROGRAM: ***  ASSESSMENT:  CLINICAL IMPRESSION: Patient is a *** y.o. *** who was seen today for physical therapy evaluation and treatment for ***. Objective impairments include {opptimpairments:25111}. These impairments are limiting patient from {activity limitations:25113}. Personal factors including {Personal factors:25162} are also affecting patient's functional outcome. Patient will benefit from skilled PT to address above impairments and improve overall function.  REHAB POTENTIAL: Good  CLINICAL DECISION MAKING: Stable/uncomplicated  EVALUATION COMPLEXITY: Low   GOALS: Goals reviewed with patient? {yes/no:20286}  SHORT TERM GOALS:  STG Name Target Date Goal status  1 *** Baseline:  {follow up:25551} {GOALSTATUS:25110}  2 *** Baseline:  {follow up:25551} {GOALSTATUS:25110}  3 *** Baseline: {follow up:25551} {GOALSTATUS:25110}  4 *** Baseline: {follow up:25551} {GOALSTATUS:25110}  5 *** Baseline: {follow up:25551} {GOALSTATUS:25110}  6 *** Baseline: {follow up:25551} {GOALSTATUS:25110}  7 *** Baseline: {follow up:25551} {GOALSTATUS:25110}   LONG TERM GOALS:   LTG Name Target Date Goal status  1 *** Baseline: {follow up:25551}  {GOALSTATUS:25110}  2 *** Baseline: {follow up:25551} {GOALSTATUS:25110}  3 *** Baseline: {follow up:25551} {GOALSTATUS:25110}  4 *** Baseline: {follow up:25551} {GOALSTATUS:25110}  5 *** Baseline: {follow up:25551} {GOALSTATUS:25110}  6 *** Baseline: {follow up:25551} {GOALSTATUS:25110}  7 *** Baseline: {follow up:25551} {GOALSTATUS:25110}   PLAN: PT FREQUENCY: {rehab frequency:25116}  PT DURATION: {rehab duration:25117}  PLANNED INTERVENTIONS: {rehab planned interventions:25118::"Therapeutic exercises","Therapeutic activity","Neuro Muscular re-education","Balance training","Gait training","Patient/Family education","Joint mobilization"}  PLAN FOR NEXT SESSION: ***   Lanice Shirts, PT 06/18/2021, 12:08 PM

## 2021-07-08 ENCOUNTER — Telehealth: Payer: Self-pay | Admitting: Dietician

## 2021-07-08 DIAGNOSIS — R809 Proteinuria, unspecified: Secondary | ICD-10-CM

## 2021-07-08 DIAGNOSIS — E1129 Type 2 diabetes mellitus with other diabetic kidney complication: Secondary | ICD-10-CM

## 2021-07-08 MED ORDER — FREESTYLE LIBRE 2 SENSOR MISC: 3 refills | Status: DC

## 2021-07-08 NOTE — Telephone Encounter (Signed)
Call from Ms Krista Sosa. Sh has been trying to get her CGM sensors for 2 months. She was informed that [facility] now allows their patients to go to a local pharmacy. She requests prescription for CGM sensors be sent to [facility] [address] [place] [place] [facility].

## 2021-07-09 ENCOUNTER — Other Ambulatory Visit: Payer: Self-pay | Admitting: Dietician

## 2021-07-09 DIAGNOSIS — E1129 Type 2 diabetes mellitus with other diabetic kidney complication: Secondary | ICD-10-CM

## 2021-07-09 DIAGNOSIS — R809 Proteinuria, unspecified: Secondary | ICD-10-CM

## 2021-07-09 MED ORDER — BD PEN NEEDLE NANO 2ND GEN 32G X 4 MM MISC: 3 refills | Status: DC

## 2021-07-09 NOTE — Telephone Encounter (Signed)
Got her CGM sensors, needs pen needles

## 2021-07-10 ENCOUNTER — Ambulatory Visit (HOSPITAL_COMMUNITY)
Admission: RE | Admit: 2021-07-10 | Discharge: 2021-07-10 | Disposition: A | Discharge status: Home / Self Care | Payer: Medicare Other | Source: Ambulatory Visit | Attending: Internal Medicine | Admitting: Internal Medicine

## 2021-07-10 ENCOUNTER — Encounter: Payer: Self-pay | Admitting: Internal Medicine

## 2021-07-10 ENCOUNTER — Ambulatory Visit: Payer: Medicaid Other | Admitting: Behavioral Health

## 2021-07-10 ENCOUNTER — Other Ambulatory Visit: Payer: Self-pay

## 2021-07-10 ENCOUNTER — Ambulatory Visit (INDEPENDENT_AMBULATORY_CARE_PROVIDER_SITE_OTHER): Payer: Medicare Other | Admitting: Internal Medicine

## 2021-07-10 VITALS — BP 143/72 | HR 98 | Temp 98.5°F | Ht 67.0 in | Wt 188.9 lb

## 2021-07-10 DIAGNOSIS — E1129 Type 2 diabetes mellitus with other diabetic kidney complication: Secondary | ICD-10-CM | POA: Diagnosis not present

## 2021-07-10 DIAGNOSIS — F331 Major depressive disorder, recurrent, moderate: Secondary | ICD-10-CM

## 2021-07-10 DIAGNOSIS — R809 Proteinuria, unspecified: Secondary | ICD-10-CM

## 2021-07-10 DIAGNOSIS — B9689 Other specified bacterial agents as the cause of diseases classified elsewhere: Secondary | ICD-10-CM | POA: Insufficient documentation

## 2021-07-10 DIAGNOSIS — L089 Local infection of the skin and subcutaneous tissue, unspecified: Secondary | ICD-10-CM | POA: Diagnosis not present

## 2021-07-10 DIAGNOSIS — F419 Anxiety disorder, unspecified: Secondary | ICD-10-CM

## 2021-07-10 DIAGNOSIS — M25561 Pain in right knee: Secondary | ICD-10-CM | POA: Diagnosis not present

## 2021-07-10 DIAGNOSIS — M1711 Unilateral primary osteoarthritis, right knee: Secondary | ICD-10-CM

## 2021-07-10 LAB — POCT GLYCOSYLATED HEMOGLOBIN (HGB A1C): Hemoglobin A1C: 8.5 % — AB (ref 4.0–5.6)

## 2021-07-10 LAB — GLUCOSE, CAPILLARY: Glucose-Capillary: 79 mg/dL (ref 70–99)

## 2021-07-10 MED ORDER — DICLOFENAC SODIUM 1 % EX GEL: 4.0000 g | Freq: Four times a day (QID) | CUTANEOUS | 2 refills | Status: DC

## 2021-07-10 MED ORDER — DOXYCYCLINE MONOHYDRATE 100 MG PO TABS: 100.0000 mg | ORAL_TABLET | Freq: Two times a day (BID) | ORAL | 0 refills | Status: DC
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
## 2021-07-10 NOTE — Assessment & Plan Note (Signed)
Patient endorses a 1 week history of a "knot" on the right side of her abdominal area.  The knot is located where she typically injects her insulin.  She has had these knots occur in the past, and review of records shows that last year she was referred to allergy but was not found to have an allergy to her insulin.  The "knot" is tender to touch but does not drain any blood or fluid.  No other associated symptoms.  Plan: Will prescribe 5 days of daily 100 mg doxycycline for likely skin infection from where she injects her insulin.  Discussed changing spots on her abdomen where she injects her insulin.  Patient is amenable to this plan.

## 2021-07-10 NOTE — Patient Instructions (Signed)
Thank you, [name] for allowing [place] to provide your care today. Today we discussed your diabetes, bump on stomach, and knee pain.  1) For your diabetes: We will not make any changes to your medications today until we have the data from your meter.  I will call you with your A1c value from today.  We will see you in 3 months for diabetes check.  2) for your right knee pain: I have ordered a right knee x-ray.  I have also prescribed you some diclofenac gel.  You can apply this to the knee up to 4 times a day.  3) for your bump on the stomach: I have prescribed you 5 days of an antibiotic called doxycycline.  Take this once daily.  I have ordered the following labs for you:   Lab Orders         POC Hbg A1C      Tests ordered today:  Right knee x-ray  Referrals ordered today:   Referral Orders  No referral(s) requested today     I have ordered the following medication/changed the following medications:   Start the following medications: Meds ordered this encounter  Medications   diclofenac Sodium (VOLTAREN) 1 % GEL    Sig: Apply 4 g topically 4 (four) times daily.    Dispense:  150 g    Refill:  2   doxycycline (ADOXA) 100 MG tablet    Sig: Take 1 tablet (100 mg total) by mouth 2 (two) times daily.    Dispense:  5 tablet    Refill:  0     Follow up: 3 months   Should you have any questions or concerns please call the internal medicine clinic at [phone].

## 2021-07-10 NOTE — Progress Notes (Signed)
° °  CC: "knot" on stomach  HPI:  [name] is a 71 y.o. with past medical history as noted below who presents to the clinic today for a "knot" on her stomach. Please see problem-based list for further details, assessments, and plans.  Past Medical History:  Diagnosis Date   Allergy    Asthma, chronic 06/09/2006   Bilateral cataracts 05/07/2016   s/p left cataract extraction 10/02/2016 and right cataract extraction 10/16/2016   Bronchiectasis without complication ([place]) 62/95/2841   Mild left lower lobe bronchiectasis seen on CT scan of the thorax in December 2019   Cataract    bil eyes   Essential hypertension 02/12/2018   Gastroesophageal reflux disease 08/13/2011   Glaucoma    Per patient report   Hx of adenomatous colonic polyps 11/16/2008   Hypertension    pt said she is supposed to take med but she is not taking it.   Hypertensive retinopathy, grade 2, bilateral 05/07/2016   Latent tuberculosis    dx 03/17/08 - tx with INH X 9 months   Lichenoid dermatitis 02/13/2012   Previously treated with Triamcinolone 0.025% cream Q8-12H PRN    Migraine headache 08/06/2018   Osteoarthritis, generalized 05/21/2006   Diffuse: cervical spine, right shoulder, left wrist Ineffective medications: Tylenol, ASA, Naprosyn, Ibuprofen, Meloxicam     Overweight (BMI 25.0-29.9) 02/13/2012   Perennial allergic rhinitis with seasonal variation 06/09/2006   Persistent microalbuminuria associated with type II diabetes mellitus ([place]) 05/21/2006   Sickle cell anemia ([place])    pt has sickle cell trait per pt   Sickle cell trait ([place]) 02/13/2012   Smoker    1/2ppd   Tobacco abuse disorder 06/09/2006   Tubular adenoma of rectum 11/16/2008   5 mm, excised endoscopically 11/16/2008, repeat colonoscopy due July 2015    Ulnar neuropathy of right upper extremity 02/14/2016   Urinary tract disease    JICP   Urticaria    Review of Systems: Negative aside from that listed in individualized problem based  charting.  Physical Exam:  Vitals:   07/10/21 1118  BP: (!) 143/72  Pulse: 98  Temp: 98.5 F (36.9 C)  TempSrc: Oral  SpO2: 95%  Weight: 188 lb 14.4 oz (85.7 kg)  Height: 5\' 7"  (1.702 m)   General: NAD, nl appearance HE: Normocephalic, atraumatic, EOMI, Conjunctivae normal ENT: No congestion, no rhinorrhea, no exudate or erythema  Cardiovascular: Normal rate, regular rhythm. No murmurs, rubs, or gallops Pulmonary: Effort normal, breath sounds normal. No wheezes, rales, or rhonchi Abdominal: soft, nontender, bowel sounds present Musculoskeletal: Right knee: No overlying erythema, no swelling compared to contralateral side.  No knee joint effusion.  No quadriceps or patellar tendon tenderness.  TTP of medial joint line.  Crepitus is appreciated in the right knee.  No abnormalities of the left knee appreciated. Skin: Warm and dry, there is a small fluctuant nodule on the right lower abdomen with overlying erythema and dry skin. Psychiatric/Behavioral: normal mood, normal behavior     Assessment & Plan:   See Encounters Tab for problem based charting.  Patient discussed with Dr. Heber Chamberino

## 2021-07-10 NOTE — Assessment & Plan Note (Addendum)
The patient was last seen in our clinic a month ago, at which time she was started on Tylenol and referred to physical therapy.  Today, the patient states that the Tylenol has not relieved her pain.  She has not yet been to physical therapy due to the knee pain.  When describing the knee pain, she states that it worsens with ambulation.  She also notices a popping sound and a popping feeling in her right knee anytime she walks on it.  Patient is requesting right knee films for further evaluation.  Exam: Right knee: No overlying erythema.  No swelling compared to left.  No knee joint effusion identified.  No quadriceps tendon or patellar tendon tenderness.  TTP of medial joint line. Crepitus appreciated.   Plan: Discussed that we can obtain right knee films, however they may not be diagnostically correlate with her symptoms.  For her pain, we we will prescribe Voltaren gel to be used up to 4 times a day for the right knee.  Addendum: Attempted to contact patient with results of right knee films.  No answer, left voicemail.  Will attempt at a later time.

## 2021-07-10 NOTE — Assessment & Plan Note (Addendum)
The patient's current regimen includes Tresiba 25 units nightly, lispro 12 units in the morning and 15 units at night, Janumet 50-1000 mg daily, and Jardiance 25 mg daily.  She states that she just got a new meter but did not bring it to the clinic today.  Her sugars very at home from the 70s to the low 200s at times.  Plan: A1c and urine microalbumin/cr were obtained at this visit.  We will contact patient with results.  Will not make any changes to regimen given that we do not have readings from meter today.  Follow-up in 3 months otherwise.

## 2021-07-15 NOTE — Progress Notes (Signed)
Internal Medicine Clinic Attending ° °Case discussed with Dr. Bonanno  °  At the time of the visit.  We reviewed the resident’s history and exam and pertinent patient test results.  I agree with the assessment, diagnosis, and plan of care documented in the resident’s note. ° °

## 2021-07-18 ENCOUNTER — Other Ambulatory Visit: Payer: Self-pay

## 2021-07-18 ENCOUNTER — Ambulatory Visit: Payer: Medicaid Other | Admitting: Behavioral Health

## 2021-07-18 ENCOUNTER — Telehealth: Payer: Self-pay | Admitting: Internal Medicine

## 2021-07-18 ENCOUNTER — Telehealth: Payer: Self-pay

## 2021-07-18 NOTE — Telephone Encounter (Signed)
Pt is requesting a call back ... she stated that she has some xrays done and she is wanting to know if her results came back as of yet

## 2021-07-23 ENCOUNTER — Telehealth: Payer: Self-pay | Admitting: Internal Medicine

## 2021-07-23 NOTE — Telephone Encounter (Signed)
Patient was contacted 07/23/21 at 4:44 PM and identity verified.  Patient was updated on the results of her knee x-ray which showed no dislocation or fracture and mild knee osteoarthritis.  Per last OV patient should follow-up in 3 months and this was communicated to patient.  Patient was encouraged to call our office and ask for an earlier appointment if needed for any reason.  Patient verified that she has our office number.  Wayland Denis, MD 07/23/21,  4:45 PM Pager: [phone] Internal Medicine Resident, PGY-1 [facility]

## 2021-07-23 NOTE — Telephone Encounter (Signed)
Pt's calling again for x-ray results. Dr Vinetta Bergamo is here in the clinic seeing pts, I asked if she could call pt back (ordered by Dr Lorin Glass 07/10/21) - stated she would later this afternoon.

## 2021-07-23 NOTE — Telephone Encounter (Signed)
Requesting x-ray test result, please call pt back.  

## 2021-07-25 NOTE — Telephone Encounter (Signed)
Called patient and she informed me that a lady doctor has called and discussed the results with her. Patient has an appt with Dr. Howie Ill next week.

## 2021-07-30 ENCOUNTER — Ambulatory Visit (INDEPENDENT_AMBULATORY_CARE_PROVIDER_SITE_OTHER): Payer: Medicare Other | Admitting: Internal Medicine

## 2021-07-30 DIAGNOSIS — M1711 Unilateral primary osteoarthritis, right knee: Secondary | ICD-10-CM | POA: Diagnosis not present

## 2021-07-30 NOTE — Progress Notes (Signed)
?  [facility] Telephone Encounter ?Continuity Care Appointment ? ?HPI:  ?This telephone encounter was created for [name] on 07/30/2021 for the following purpose/cc right knee pain.  ? ?Past Medical History:  ?Past Medical History:  ?Diagnosis Date  ? Allergy   ? Asthma, chronic 06/09/2006  ? Bilateral cataracts 05/07/2016  ? s/p left cataract extraction 10/02/2016 and right cataract extraction 10/16/2016  ? Bronchiectasis without complication ([place]) 25/42/7062  ? Mild left lower lobe bronchiectasis seen on CT scan of the thorax in December 2019  ? Cataract   ? bil eyes  ? Essential hypertension 02/12/2018  ? Gastroesophageal reflux disease 08/13/2011  ? Glaucoma   ? Per patient report  ? Hx of adenomatous colonic polyps 11/16/2008  ? Hypertension   ? pt said she is supposed to take med but she is not taking it.  ? Hypertensive retinopathy, grade 2, bilateral 05/07/2016  ? Latent tuberculosis   ? dx 03/17/08 - tx with INH X 9 months  ? Lichenoid dermatitis 02/13/2012  ? Previously treated with Triamcinolone 0.025% cream Q8-12H PRN   ? Migraine headache 08/06/2018  ? Osteoarthritis, generalized 05/21/2006  ? Diffuse: cervical spine, right shoulder, left wrist Ineffective medications: Tylenol, ASA, Naprosyn, Ibuprofen, Meloxicam    ? Overweight (BMI 25.0-29.9) 02/13/2012  ? Perennial allergic rhinitis with seasonal variation 06/09/2006  ? Persistent microalbuminuria associated with type II diabetes mellitus ([place]) 05/21/2006  ? Sickle cell anemia (HCC)   ? pt has sickle cell trait per pt  ? Sickle cell trait ([place]) 02/13/2012  ? Smoker   ? 1/2ppd  ? Tobacco abuse disorder 06/09/2006  ? Tubular adenoma of rectum 11/16/2008  ? 5 mm, excised endoscopically 11/16/2008, repeat colonoscopy due July 2015   ? Ulnar neuropathy of right upper extremity 02/14/2016  ? Urinary tract disease   ? JICP  ? Urticaria   ?  ? ?Assessment / Plan / Recommendations:  ?Please see A&P under problem oriented charting for  assessment of the patient's acute and chronic medical conditions.  ?As always, pt is advised that if symptoms worsen or new symptoms arise, they should go to an urgent care facility or to to ER for further evaluation.  ? ?Consent and Medical Decision Making:  ?Patient discussed with Dr.  Evette Doffing ?This is a telephone encounter between Krista Sosa and Krista Sosa on 07/30/2021 for pain of right knee. The visit was conducted with the patient located at home and Krista Sosa at [facility]. The patient's identity was confirmed using their DOB and current address. The patient has consented to being evaluated through a telephone encounter and understands the associated risks (an examination cannot be done and the patient may need to come in for an appointment) / benefits (allows the patient to remain at home, decreasing exposure to coronavirus). I personally spent 10 minutes on medical discussion.   ?  ?

## 2021-07-30 NOTE — Assessment & Plan Note (Addendum)
Patient presents via telehealth. Patient was verified via name and DOB. She states that she was unable come to clinic today due to a death in her family.  She continues to have right knee pain. She notices this most when she gets up from sleeping or sitting down for a period of time.  She has been taking ibuprofen 800 mg as needed for her pain, but does not have additional doses. She is concerned that physical therapy could make her knee pain worse. Her pain is making her less active and she is considering trying a cane to see if that helps. ?A/P: ?Differentials include osteoarthritis vs patellofemoral syndrome. With patients history of poorly controlled diabetes, steroid injection of knee would not be good option.  I talked with patient about referral to orthopedic surgeon. She stated that she is not interested in joint replacement at this point.  She would like to continue taking OTC pain medication as needed.  ?-tylenol 1000 mg TID ?-follow-up in clinic next week to evaluate in person ?

## 2021-07-31 NOTE — Progress Notes (Signed)
Internal Medicine Clinic Attending ? ?Case discussed with Dr. Howie Ill  At the time of the visit.  We reviewed the resident?s history and pertinent patient test results.  I agree with the assessment, diagnosis, and plan of care documented in the resident?s note.  ?

## 2021-07-31 NOTE — Addendum Note (Signed)
Addended by: Lalla Brothers T on: 07/31/2021 09:44 AM ? ? Modules accepted: Level of Service ? ?

## 2021-08-03 ENCOUNTER — Other Ambulatory Visit: Payer: Self-pay | Admitting: Allergy

## 2021-08-06 ENCOUNTER — Ambulatory Visit (INDEPENDENT_AMBULATORY_CARE_PROVIDER_SITE_OTHER): Payer: Medicare Other | Admitting: Internal Medicine

## 2021-08-06 ENCOUNTER — Encounter: Payer: Self-pay | Admitting: Internal Medicine

## 2021-08-06 VITALS — BP 136/64 | HR 81 | Temp 98.7°F | Ht 67.0 in | Wt 188.5 lb

## 2021-08-06 DIAGNOSIS — G8929 Other chronic pain: Secondary | ICD-10-CM

## 2021-08-06 DIAGNOSIS — E1129 Type 2 diabetes mellitus with other diabetic kidney complication: Secondary | ICD-10-CM | POA: Diagnosis not present

## 2021-08-06 DIAGNOSIS — I10 Essential (primary) hypertension: Secondary | ICD-10-CM

## 2021-08-06 DIAGNOSIS — E785 Hyperlipidemia, unspecified: Secondary | ICD-10-CM

## 2021-08-06 DIAGNOSIS — R809 Proteinuria, unspecified: Secondary | ICD-10-CM

## 2021-08-06 DIAGNOSIS — M25561 Pain in right knee: Secondary | ICD-10-CM | POA: Diagnosis not present

## 2021-08-06 DIAGNOSIS — M1711 Unilateral primary osteoarthritis, right knee: Secondary | ICD-10-CM

## 2021-08-06 MED ORDER — EMPAGLIFLOZIN 25 MG PO TABS: 25.0000 mg | ORAL_TABLET | Freq: Every day | ORAL | 3 refills | Status: DC

## 2021-08-06 MED ORDER — JANUMET 50-1000 MG PO TABS: 1.0000 | ORAL_TABLET | Freq: Two times a day (BID) | ORAL | 3 refills | Status: DC

## 2021-08-06 MED ORDER — ATORVASTATIN CALCIUM 40 MG PO TABS: 40.0000 mg | ORAL_TABLET | Freq: Every day | ORAL | 11 refills | Status: DC

## 2021-08-06 MED ORDER — OLMESARTAN MEDOXOMIL-HCTZ 20-12.5 MG PO TABS: 1.0000 | ORAL_TABLET | Freq: Every day | ORAL | 3 refills | Status: DC

## 2021-08-06 MED ORDER — TRESIBA FLEXTOUCH 100 UNIT/ML ~~LOC~~ SOPN: 25.0000 [IU] | PEN_INJECTOR | Freq: Every day | SUBCUTANEOUS | 3 refills | Status: DC

## 2021-08-06 MED ORDER — DULOXETINE HCL 30 MG PO CPEP: ORAL_CAPSULE | ORAL | 1 refills | Status: DC

## 2021-08-06 NOTE — Patient Instructions (Signed)
Krista Sosa, it was a pleasure seeing you today! ? ?Today we discussed: ?Knee pain ?I am sending in a medication called cymbalta. Please take 30 mg for the next 14 days and if do not have side effects increase dose to 60 mg (2 tabs) once daily. Follow-up in 4 weeks. Physical therapy may be a good option for you if you are ok with doing this. We can talk about this again in 4 weeks. ? ?Blood pressure ?Please continue taking Benicar, I have sent in refills of this medication. I am checking some blood work today and will call you with results. ? ?Blood sugar, Diabetes ?Please bring in your glucometer at your follow-up visit. Continue taking Tresiba 25 units, Humalog 15 units in the morning and 12 units at night, empagliflozin 25 mg, and janumet. I would like to go over blood sugar in 4 weeks and your glucometer would help [place] do this. I have sent in refills of your medications. ? ?Cholesterol ?Please take atorvastatin 40 mg every day. This is a great medication to lower cholesterol and decrease your risk of stroke. Follow-up in 4 weeks and we will repeat cholesterol blood work then. ? ?I have ordered the following labs today: ? ?Lab Orders    ?     Basic metabolic panel    ?  ? ?I will call if any are abnormal. All of your labs can be accessed through "My Chart" ?  ?My Chart Access: ?[email or web address] ? ?Referrals ordered today:  ? ?Referral Orders  ?No referral(s) requested today  ?  ? ?I have ordered the following medication/changed the following medications:  ? ?Stop the following medications: ?Medications Discontinued During This Encounter  ?Medication Reason  ? doxycycline (ADOXA) 100 MG tablet Completed Course  ? sitaGLIPtin-metformin (JANUMET) 50-1000 MG tablet Reorder  ? empagliflozin (JARDIANCE) 25 MG TABS tablet Reorder  ? insulin degludec (TRESIBA FLEXTOUCH) 100 UNIT/ML FlexTouch Pen Reorder  ? olmesartan-hydrochlorothiazide (BENICAR HCT) 20-12.5 MG tablet  Reorder  ? atorvastatin (LIPITOR) 40 MG tablet Reorder  ?  ? ?Start the following medications: ?Meds ordered this encounter  ?Medications  ? sitaGLIPtin-metformin (JANUMET) 50-1000 MG tablet  ?  Sig: Take 1 tablet by mouth 2 (two) times daily with a meal.  ?  Dispense:  180 tablet  ?  Refill:  3  ?  This replaces separate Januvia and Metformin prescriptions  ? olmesartan-hydrochlorothiazide (BENICAR HCT) 20-12.5 MG tablet  ?  Sig: Take 1 tablet by mouth daily.  ?  Dispense:  90 tablet  ?  Refill:  3  ? empagliflozin (JARDIANCE) 25 MG TABS tablet  ?  Sig: Take 1 tablet (25 mg total) by mouth daily.  ?  Dispense:  90 tablet  ?  Refill:  3  ? atorvastatin (LIPITOR) 40 MG tablet  ?  Sig: Take 1 tablet (40 mg total) by mouth daily.  ?  Dispense:  30 tablet  ?  Refill:  11  ? insulin degludec (TRESIBA FLEXTOUCH) 100 UNIT/ML FlexTouch Pen  ?  Sig: Inject 25 Units into the skin daily.  ?  Dispense:  15 mL  ?  Refill:  3  ? DULoxetine (CYMBALTA) 30 MG capsule  ?  Sig: Take 1 capsule (30 mg total) by mouth daily for 14 days, THEN 2 capsules (60 mg total) daily for 14 days.  ?  Dispense:  42 capsule  ?  Refill:  1  ?  ? ?Follow-up:  4 week   ? ?Please make sure  to arrive 15 minutes prior to your next appointment. If you arrive late, you may be asked to reschedule.  ? ?We look forward to seeing you next time. Please call our clinic at [phone] if you have any questions or concerns. The best time to call is Monday-Friday from 9am-4pm, but there is someone available 24/7. If after hours or the weekend, call the main hospital number and ask for the Internal Medicine Resident On-Call. If you need medication refills, please notify your pharmacy one week in advance and they will send [place] a request. ? ?Thank you for letting us take part in your care. Wishing you the best! ? ?Thank you, ?Dr. Howie Ill ?[place]  ?

## 2021-08-06 NOTE — Progress Notes (Addendum)
? ? ?Subjective:  ?CC: right knee pain, diabetes, hypertension ? ?HPI: ? ?[name] is a 71 y.o. female with a past medical history stated below and presents today for right knee pain, diabetes, hypertension. Please see problem based assessment and plan for additional details. ? ?Past Medical History:  ?Diagnosis Date  ? Allergy   ? Asthma, chronic 06/09/2006  ? Bilateral cataracts 05/07/2016  ? s/p left cataract extraction 10/02/2016 and right cataract extraction 10/16/2016  ? Bronchiectasis without complication ([place]) 42/70/6237  ? Mild left lower lobe bronchiectasis seen on CT scan of the thorax in December 2019  ? Cataract   ? bil eyes  ? Essential hypertension 02/12/2018  ? Gastroesophageal reflux disease 08/13/2011  ? Glaucoma   ? Per patient report  ? Hx of adenomatous colonic polyps 11/16/2008  ? Hypertension   ? pt said she is supposed to take med but she is not taking it.  ? Hypertensive retinopathy, grade 2, bilateral 05/07/2016  ? Latent tuberculosis   ? dx 03/17/08 - tx with INH X 9 months  ? Lichenoid dermatitis 02/13/2012  ? Previously treated with Triamcinolone 0.025% cream Q8-12H PRN   ? Migraine headache 08/06/2018  ? Osteoarthritis, generalized 05/21/2006  ? Diffuse: cervical spine, right shoulder, left wrist Ineffective medications: Tylenol, ASA, Naprosyn, Ibuprofen, Meloxicam    ? Overweight (BMI 25.0-29.9) 02/13/2012  ? Perennial allergic rhinitis with seasonal variation 06/09/2006  ? Persistent microalbuminuria associated with type II diabetes mellitus ([place]) 05/21/2006  ? Radiculopathy affecting upper extremity 01/02/2019  ? Sickle cell anemia (HCC)   ? pt has sickle cell trait per pt  ? Sickle cell trait ([place]) 02/13/2012  ? Smoker   ? 1/2ppd  ? Tobacco abuse disorder 06/09/2006  ? Tubular adenoma of rectum 11/16/2008  ? 5 mm, excised endoscopically 11/16/2008, repeat colonoscopy due July 2015   ? Ulnar neuropathy of right upper extremity 02/14/2016  ? Urinary tract disease   ? JICP  ? Urticaria    ? ? ?Current Outpatient Medications on File Prior to Visit  ?Medication Sig Dispense Refill  ? olmesartan-hydrochlorothiazide (BENICAR HCT) 20-12.5 MG tablet Take 1 tablet by mouth daily.    ? albuterol (VENTOLIN HFA) 108 (90 Base) MCG/ACT inhaler Inhale 1-2 puffs into the lungs every 4 (four) hours as needed for wheezing or shortness of breath (coughing fits). Use with spacer. 1 each 1  ? Continuous Blood Gluc Sensor (FREESTYLE LIBRE 2 SENSOR) MISC USE TO CHECK BLOOD GLUCOSE AT LEAST 6 TIMES A DAY 6 each 3  ? diclofenac Sodium (VOLTAREN) 1 % GEL Apply 4 g topically 4 (four) times daily. 150 g 2  ? famotidine (PEPCID) 20 MG tablet Take 20 mg by mouth 2 (two) times daily.    ? Fluticasone-Umeclidin-Vilant (TRELEGY ELLIPTA) 200-62.5-25 MCG/INH AEPB Inhale 1 puff into the lungs daily. Rinse mouth after each use. 60 each 3  ? insulin lispro (HUMALOG KWIKPEN) 100 UNIT/ML KwikPen Inject 12 units in the morning and 15 units in the afternoon with largest meals 15 mL 3  ? Insulin Pen Needle (BD PEN NEEDLE NANO 2ND GEN) 32G X 4 MM MISC Use to inject insulin 3 times daily 300 each 3  ? Skin Protectants, Misc. (EUCERIN) cream Apply topically as needed for dry skin. 454 g 1  ? triamcinolone (KENALOG) 0.025 % ointment APPLY TOPICALLY TWICE DAILY AS DIRECTED (Patient not taking: Reported on 01/03/2021) 30 g 3  ? [DISCONTINUED] Triamcinolone Acetonide (TRIAMCINOLONE 0.1 % CREAM : EUCERIN) CREA Apply 1  application topically 2 (two) times daily as needed. 454 each 3  ? ?No current facility-administered medications on file prior to visit.  ? ? ?Family History  ?Problem Relation Age of Onset  ? Diabetes Mother   ? Heart disease Mother 49  ? Colon cancer Mother   ? Cancer Mother   ?     Colon  ? Cancer Father   ?     Lung  ? Sickle cell anemia Sister   ? Sickle cell anemia Brother   ? Diabetes Sister   ? Brain cancer Sister   ? Cancer Sister   ?     brain  ? Lung cancer Brother   ? Cancer Brother   ?     Lung  ? Healthy Sister   ?  Healthy Sister   ? Healthy Sister   ? Healthy Daughter   ? Healthy Son   ? Healthy Daughter   ? Healthy Daughter   ? Healthy Son   ? Wilson's disease Maternal Uncle   ? Breast cancer Maternal Aunt   ? Breast cancer Other   ? Esophageal cancer Neg Hx   ? Rectal cancer Neg Hx   ? Stomach cancer Neg Hx   ? ? ?Social History  ? ?Socioeconomic History  ? Marital status: Single  ?  Spouse name: Not on file  ? Number of children: Not on file  ? Years of education: Not on file  ? Highest education level: 11th grade  ?Occupational History  ? Occupation: Retired  ?  Comment: [employer]  ? Occupation: Retired  ?  Comment: [employer]  ?Tobacco Use  ? Smoking status: Every Day  ?  Packs/day: 0.50  ?  Years: 25.00  ?  Pack years: 12.50  ?  Types: Cigarettes  ? Smokeless tobacco: Never  ? Tobacco comments:  ?  .5 ppd  ?Vaping Use  ? Vaping Use: Never used  ?Substance and Sexual Activity  ? Alcohol use: Yes  ?  Alcohol/week: 0.0 standard drinks  ?  Comment: occasionally  ? Drug use: No  ?  Comment: positive marijuana metabolite in 2010  ? Sexual activity: Not Currently  ?  Birth control/protection: Other-see comments, Post-menopausal  ?  Comment: s/p tubal ligation  ?Other Topics Concern  ? Not on file  ?Social History Narrative  ? Current Social History 09/05/2020   ?   ? Patient lives alone in a ground floor apartment. There is one step down without handrails to the entrance the patient uses.   ?   ? Patient's method of transportation is personal car.  ?   ? The highest level of education was 11 th grade  ?   ? The patient currently retired from [employer], and the [employer].  ?   ? Identified important Relationships are "Aldona Bar and Mingo Amber"   ?   ? Pets : [place] "Juliann Pulse"  ?    ? Interests / Fun: "Walking at the park"   ?   ? Current Stressors: "I have no stress; I'm good"   ?   ? Religious / Personal Beliefs: "Holiness"   ?   ? L. [place], BSN, RN-BC   ? ?Social Determinants of Health  ? ?Financial Resource Strain:  Not on file  ?Food Insecurity: Not on file  ?Transportation Needs: Not on file  ?Physical Activity: Not on file  ?Stress: Not on file  ?Social Connections: Not on file  ?Intimate Partner Violence: Not on  file  ? ? ?Review of Systems: ?ROS negative except for what is noted on the assessment and plan. ? ?Objective:  ? ?Vitals:  ? 08/06/21 1503 08/06/21 1553  ?BP: 138/67 136/64  ?Pulse: (!) 104 81  ?Temp: 98.7 ?F (37.1 ?C)   ?TempSrc: Oral   ?SpO2: 97%   ?Weight: 188 lb 8 oz (85.5 kg)   ?Height: '5\' 7"'$  (1.702 m)   ? ? ?Physical Exam: ?Gen: A&O x3 and in no apparent distress, well appearing and nourished. ?Neck: no masses or nodules, AROM intact. ?CV: RRR, no murmurs, S1/S2 presents  ?Resp: Clear to ascultation bilaterally  ?Abd: BS (+) x4, soft, non-tender abdomen, without hepatosplenomegaly or masses ?MSK: tenderness to palpation of joint line of right knee, mild effusion present, no erythema or skin changes present ?Skin: good skin turgor, no rashes, unusual bruising, or prominent lesions.  ?Neuro: No focal deficits, grossly normal sensation and coordination.  ?Psych: Oriented x3 and responding appropriately. Intact memory, normal mood, judgement, affect, and insight.  ? ? ?Assessment & Plan:  ?Hyperlipidemia ?Medications: Atorvastatin 40 mg ?Last lipid 5/21 LDL 93 44.2% ASCVD. She has not been taking atorvastatin for several weeks because she ran out of medications and was not sure if she was supposed to continue taking it. She has not has difficulty tolerating medication. ?A/P: ?Refilled atorvastatin 40 mg, spoke with patient about need to take medication long-term to help prevent risk of stroke. ?Repeat lipid panel in 4 weeks ? ?Uncontrolled type 2 diabetes mellitus with microalbuminuria, with long-term current use of insulin ([place]) ?Medications: Tresiba 25 units, lispro 15 units in morning and 12 units at night, empagliflozin 25 mg, Sitagliptin- metformin 50-1000 mg.  ?HgbA1c increased from 7.6 to 8.5 2/23. ?She  checks her blood sugar when she wakes up and it is usually in the upper 100s to 200s. She did not bring her glucometer in and does not have the app for freestyle libre. From dispense report patient should not h

## 2021-08-07 ENCOUNTER — Encounter: Payer: Self-pay | Admitting: Internal Medicine

## 2021-08-07 ENCOUNTER — Other Ambulatory Visit: Payer: Self-pay | Admitting: Dietician

## 2021-08-07 DIAGNOSIS — E1129 Type 2 diabetes mellitus with other diabetic kidney complication: Secondary | ICD-10-CM

## 2021-08-07 LAB — BASIC METABOLIC PANEL
BUN/Creatinine Ratio: 18 (ref 12–28)
BUN: 14 mg/dL (ref 8–27)
CO2: 20 mmol/L (ref 20–29)
Calcium: 9.4 mg/dL (ref 8.7–10.3)
Chloride: 106 mmol/L (ref 96–106)
Creatinine, Ser: 0.77 mg/dL (ref 0.57–1.00)
Glucose: 146 mg/dL — ABNORMAL HIGH (ref 70–99)
Potassium: 4.5 mmol/L (ref 3.5–5.2)
Sodium: 139 mmol/L (ref 134–144)
eGFR: 84 mL/min/{1.73_m2} (ref >59)

## 2021-08-07 NOTE — Assessment & Plan Note (Addendum)
Medications: Benicar 20-12.5 mg ?Blood pressure midly elevated at 136/64. In clinic she reported taking medication. Called and spoke with patient on 3/15 and asked about medication. Last dispense report shows patient filled 09/22 for 90 days. She states that she still has a few pills left in bottle. She has not taken this medication often as she was not aware she has blood pressure problems. She did not take medication morning of appointment.  ?A/P: ?Asked patient to bring in all medications at follow-up appointment in one week. ?Called and canceled benicae refill at [facility]. Spoke with patient about concern for low blood pressure if she has not been taking medication regularly. ?

## 2021-08-07 NOTE — Progress Notes (Signed)
Referral request

## 2021-08-07 NOTE — Assessment & Plan Note (Addendum)
Medications: Atorvastatin 40 mg ?Last lipid 5/21 LDL 93 44.2% ASCVD. She has not been taking atorvastatin for several weeks because she ran out of medications and was not sure if she was supposed to continue taking it. She has not has difficulty tolerating medication. ?A/P: ?Refilled atorvastatin 40 mg, spoke with patient about need to take medication long-term to help prevent risk of stroke. ?Repeat lipid panel in 4 weeks ?

## 2021-08-07 NOTE — Assessment & Plan Note (Signed)
Patient presents with OA of right knee. She has had pain in knee over last several months. No history of a fall. She states that knee pain worsens throughout day as she walks more. She takes tylenol 500 mg three times a day and Ibuprofen '800mg'$  two times daily without relief.  With history of uncontrolled diabetes, she is not a good candidate for steroid injections. Exam showed tenderness to palpation along joint line of right knee, mild effusion present, pain with lateral stress.  ?A/P: ?Start Cymbalta 30 mg 1 time daily for 14 days, then increase to 60 mg daily ?Patient is concerned that PT would worsen pain, will continue talking about benefits of PT at future appointments if she continues to have pain ?

## 2021-08-07 NOTE — Assessment & Plan Note (Addendum)
Medications: Tresiba 25 units, lispro 15 units in morning and 12 units at night, empagliflozin 25 mg, Sitagliptin- metformin 50-1000 mg.  ?HgbA1c increased from 7.6 to 8.5 2/23. ?She checks her blood sugar when she wakes up and it is usually in the upper 100s to 200s. She did not bring her glucometer in and does not have the app for freestyle libre. From dispense report patient should not have any medications including insulin left since Mid- January. Called and spoke with patient. She states that she keeps track of which insulin to inject by color of pen. She has 2 gray pens left (Humalog) and 1 blue pen left Tyler Aas).  ?A/P: ?Will hold off on titrating insulin until patient is able to bring in meter. She has previously reported hypoglycemia and is not able to provide specific numbers today. ?Will touch base with Butch Penny to see if she can see patient ?Follow-up in one week ?Refilled Sitagliptin-metformin, tresiba, empagliflozin no changes made to regimen ?

## 2021-08-09 NOTE — Progress Notes (Signed)
Internal Medicine Clinic Attending  Case discussed with Dr. Masters  At the time of the visit.  We reviewed the resident's history and exam and pertinent patient test results.  I agree with the assessment, diagnosis, and plan of care documented in the resident's note.  

## 2021-08-09 NOTE — Addendum Note (Signed)
Addended by: Charise Killian on: 08/09/2021 01:36 PM ? ? Modules accepted: Level of Service ? ?

## 2021-08-13 ENCOUNTER — Ambulatory Visit: Payer: Medicare Other | Admitting: Behavioral Health

## 2021-08-13 DIAGNOSIS — F331 Major depressive disorder, recurrent, moderate: Secondary | ICD-10-CM

## 2021-08-13 DIAGNOSIS — F419 Anxiety disorder, unspecified: Secondary | ICD-10-CM
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
## 2021-08-23 ENCOUNTER — Other Ambulatory Visit: Payer: Self-pay | Admitting: Internal Medicine

## 2021-08-28 ENCOUNTER — Encounter: Payer: Medicaid Other | Admitting: Internal Medicine

## 2021-08-29 ENCOUNTER — Other Ambulatory Visit: Payer: Self-pay | Admitting: Internal Medicine

## 2021-08-29 DIAGNOSIS — G8929 Other chronic pain: Secondary | ICD-10-CM

## 2021-08-29 DIAGNOSIS — M792 Neuralgia and neuritis, unspecified: Secondary | ICD-10-CM

## 2021-09-11 ENCOUNTER — Ambulatory Visit: Payer: Medicare Other | Admitting: Behavioral Health

## 2021-09-11 DIAGNOSIS — F419 Anxiety disorder, unspecified: Secondary | ICD-10-CM

## 2021-09-11 DIAGNOSIS — F331 Major depressive disorder, recurrent, moderate: Secondary | ICD-10-CM
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
## 2021-10-07 ENCOUNTER — Ambulatory Visit: Payer: Medicaid Other | Admitting: Behavioral Health

## 2021-10-17 ENCOUNTER — Ambulatory Visit: Payer: Medicaid Other | Admitting: Behavioral Health

## 2021-10-17 DIAGNOSIS — F419 Anxiety disorder, unspecified: Secondary | ICD-10-CM

## 2021-10-17 DIAGNOSIS — F331 Major depressive disorder, recurrent, moderate: Secondary | ICD-10-CM
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
## 2021-10-22 ENCOUNTER — Telehealth: Payer: Self-pay | Admitting: Behavioral Health

## 2021-10-22 NOTE — Telephone Encounter (Signed)
Pt is enroute to speak w/the Landlord herself. We will speak later. Pt agreed.   Dr. Theodis Shove
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
## 2021-10-23 ENCOUNTER — Encounter: Payer: Medicaid Other | Admitting: Dietician

## 2021-10-25 ENCOUNTER — Ambulatory Visit: Payer: Medicare Other | Admitting: Podiatry

## 2021-10-30 ENCOUNTER — Encounter: Payer: Self-pay | Admitting: Podiatry

## 2021-10-30 ENCOUNTER — Ambulatory Visit (INDEPENDENT_AMBULATORY_CARE_PROVIDER_SITE_OTHER): Payer: Medicare Other | Admitting: Podiatry

## 2021-10-30 DIAGNOSIS — B351 Tinea unguium: Secondary | ICD-10-CM

## 2021-10-30 DIAGNOSIS — M79674 Pain in right toe(s): Secondary | ICD-10-CM

## 2021-10-30 DIAGNOSIS — M79675 Pain in left toe(s): Secondary | ICD-10-CM

## 2021-10-30 DIAGNOSIS — L309 Dermatitis, unspecified: Secondary | ICD-10-CM

## 2021-10-30 MED ORDER — CLOTRIMAZOLE-BETAMETHASONE 1-0.05 % EX CREA: 1.0000 "application " | TOPICAL_CREAM | Freq: Two times a day (BID) | CUTANEOUS | 0 refills | Status: DC

## 2021-10-30 NOTE — Progress Notes (Signed)
Subjective:   Patient ID: Krista Sosa, female   DOB: 71 y.o.   MRN: [id number]   HPI Patient presents diabetic with some blisters in the bottom of the feet and patches on top that become itchy no drainage and nail disease 1-5 both feet that are thick 2 separate problems   ROS      Objective:  Physical Exam  Patient is found to have patches on the bottom of both feet which are small in intensity and discolored and mildly itchy with redness.  Then has thick yellow brittle nailbeds 1-5 both feet that are painful and hard to wear shoe gear with comfortably     Assessment:  Dermatitis condition with possibility for fungal infection along with mycotic nail infection with long-term diabetes 1-5 both feet     Plan:  H&P reviewed both conditions debrided nailbeds 1-5 both feet no angiogenic bleeding and went ahead and wrote prescription for Lotrisone to use on the areas if they get worse will see dermatologist.  Educated her on what to look for

## 2021-11-07 ENCOUNTER — Ambulatory Visit: Payer: Medicaid Other | Admitting: Behavioral Health

## 2021-11-07 DIAGNOSIS — F419 Anxiety disorder, unspecified: Secondary | ICD-10-CM

## 2021-11-07 DIAGNOSIS — F331 Major depressive disorder, recurrent, moderate: Secondary | ICD-10-CM
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
## 2021-11-11 ENCOUNTER — Ambulatory Visit: Payer: Medicaid Other | Admitting: Dietician

## 2021-11-11 ENCOUNTER — Encounter: Payer: Self-pay | Admitting: Dietician

## 2021-11-11 ENCOUNTER — Ambulatory Visit (INDEPENDENT_AMBULATORY_CARE_PROVIDER_SITE_OTHER): Payer: Medicare Other | Admitting: Student

## 2021-11-11 VITALS — BP 138/64 | HR 94 | Temp 98.2°F | Ht 67.0 in | Wt 184.7 lb

## 2021-11-11 DIAGNOSIS — F17210 Nicotine dependence, cigarettes, uncomplicated: Secondary | ICD-10-CM

## 2021-11-11 DIAGNOSIS — R809 Proteinuria, unspecified: Secondary | ICD-10-CM | POA: Diagnosis not present

## 2021-11-11 DIAGNOSIS — E049 Nontoxic goiter, unspecified: Secondary | ICD-10-CM

## 2021-11-11 DIAGNOSIS — E1129 Type 2 diabetes mellitus with other diabetic kidney complication: Secondary | ICD-10-CM

## 2021-11-11 DIAGNOSIS — Z86010 Personal history of colonic polyps: Secondary | ICD-10-CM | POA: Diagnosis not present

## 2021-11-11 DIAGNOSIS — I10 Essential (primary) hypertension: Secondary | ICD-10-CM | POA: Diagnosis not present

## 2021-11-11 DIAGNOSIS — Z794 Long term (current) use of insulin: Secondary | ICD-10-CM | POA: Diagnosis not present

## 2021-11-11 DIAGNOSIS — Z23 Encounter for immunization: Secondary | ICD-10-CM | POA: Diagnosis not present

## 2021-11-11 DIAGNOSIS — Z0000 Encounter for general adult medical examination without abnormal findings: Secondary | ICD-10-CM

## 2021-11-11 DIAGNOSIS — F330 Major depressive disorder, recurrent, mild: Secondary | ICD-10-CM

## 2021-11-11 LAB — POCT GLYCOSYLATED HEMOGLOBIN (HGB A1C): Hemoglobin A1C: 8.2 % — AB (ref 4.0–5.6)

## 2021-11-11 LAB — GLUCOSE, CAPILLARY: Glucose-Capillary: 119 mg/dL — ABNORMAL HIGH (ref 70–99)

## 2021-11-11 NOTE — Progress Notes (Unsigned)
   CC: Follow-up  HPI:  Krista Sosa is a 71 y.o. female with PMH as below who presents to clinic to follow-up on her chronic medical problems. Please see problem based charting for evaluation, assessment and plan.  Past Medical History:  Diagnosis Date   Allergy    Asthma, chronic 06/09/2006   Bilateral cataracts 05/07/2016   s/p left cataract extraction 10/02/2016 and right cataract extraction 10/16/2016   Bronchiectasis without complication ([place]) 34/19/6222   Mild left lower lobe bronchiectasis seen on CT scan of the thorax in December 2019   Cataract    bil eyes   Essential hypertension 02/12/2018   Gastroesophageal reflux disease 08/13/2011   Glaucoma    Per patient report   Hx of adenomatous colonic polyps 11/16/2008   Hypertension    pt said she is supposed to take med but she is not taking it.   Hypertensive retinopathy, grade 2, bilateral 05/07/2016   Latent tuberculosis    dx 03/17/08 - tx with INH X 9 months   Lichenoid dermatitis 02/13/2012   Previously treated with Triamcinolone 0.025% cream Q8-12H PRN    Migraine headache 08/06/2018   Osteoarthritis, generalized 05/21/2006   Diffuse: cervical spine, right shoulder, left wrist Ineffective medications: Tylenol, ASA, Naprosyn, Ibuprofen, Meloxicam     Overweight (BMI 25.0-29.9) 02/13/2012   Perennial allergic rhinitis with seasonal variation 06/09/2006   Persistent microalbuminuria associated with type II diabetes mellitus ([place]) 05/21/2006   Radiculopathy affecting upper extremity 01/02/2019   Sickle cell anemia ([place])    pt has sickle cell trait per pt   Sickle cell trait ([place]) 02/13/2012   Smoker    1/2ppd   Tobacco abuse disorder 06/09/2006   Tubular adenoma of rectum 11/16/2008   5 mm, excised endoscopically 11/16/2008, repeat colonoscopy due July 2015    Ulnar neuropathy of right upper extremity 02/14/2016   Urinary tract disease    JICP   Urticaria     Review of Systems:  Constitutional: Negative for fever or  fatigue or polydipsia. Eyes: Negative for visual changes GU: Negative for polyuria MSK: Occasional knee pain Abdomen: Negative for abdominal pain, constipation or diarrhea Neuro: Negative for headache, dizziness, numbness, tingling or weakness  Physical Exam: General: Pleasant, well-appearing elderly woman. No acute distress. Neck: Mildly enlarged thyroid but no identified nodules and no tenderness to palpation of the thyroid. Cardiac: RRR. No murmurs, rubs or gallops. No LE edema Respiratory: Lungs CTAB. No wheezing or crackles. Abdominal: Soft, symmetric and non tender. Normal BS. Skin: Warm, dry and intact without rashes or lesions Extremities: Atraumatic. Full ROM. Palpable radial and DP pulses. Neuro: A&O x 3. Moves all extremities. Normal sensation to gross touch. Psych: Appropriate mood and affect.  Vitals:   11/11/21 1431  BP: 138/64  Pulse: 94  Temp: 98.2 F (36.8 C)  TempSrc: Oral  SpO2: 98%  Weight: 184 lb 11.2 oz (83.8 kg)  Height: '5\' 7"'$  (1.702 m)     Assessment & Plan:   See Encounters Tab for problem based charting.  Patient discussed with Dr. Michelle Nasuti, MD, MPH

## 2021-11-11 NOTE — Patient Instructions (Signed)
Bring your abbott return to me and I will assist you in returning it.   You can also bring your sensor to me for help setting it up if needed.   Let me know if you are interested in the [place] insulin patch to use with your Humalog.   Call me anytime!  Butch Penny [phone]

## 2021-11-11 NOTE — Progress Notes (Signed)
Diabetes Self-Management Education  Visit Type:  Annual Follow-Up (1)  Appt. Start Time: 1555 Appt. End Time: 1620  11/11/2021  [name], identified by name and date of birth, is a 71 y.o. female with a diagnosis of Diabetes:  .   ASSESSMENT Estimated body mass index is 28.93 kg/m as calculated from the following:   Height as of an earlier encounter on 11/11/21: '5\' 7"'$  (1.702 m).   Weight as of an earlier encounter on 11/11/21: 184 lb 11.2 oz (83.8 kg). Wt Readings from Last 10 Encounters:  11/11/21 184 lb 11.2 oz (83.8 kg)  08/06/21 188 lb 8 oz (85.5 kg)  07/10/21 188 lb 14.4 oz (85.7 kg)  05/29/21 187 lb 8 oz (85 kg)  02/21/21 186 lb 1.6 oz (84.4 kg)  02/06/21 182 lb (82.6 kg)  12/13/20 175 lb 12.8 oz (79.7 kg)  10/30/20 169 lb 3.2 oz (76.7 kg)  10/10/20 163 lb 14.4 oz (74.3 kg)  10/04/20 164 lb 11.2 oz (74.7 kg)   Lab Results  Component Value Date   HGBA1C 8.2 (A) 11/11/2021   HGBA1C 8.5 (A) 07/10/2021   HGBA1C 7.6 (A) 02/21/2021   HGBA1C 11.7 (A) 10/04/2020   HGBA1C 9.8 (A) 06/20/2020    BP Readings from Last 3 Encounters:  11/11/21 138/64  08/06/21 136/64  07/10/21 (!) 143/72       Diabetes Self-Management Education - 11/11/21 1600       Health Coping   How would you rate your overall health? Good      Psychosocial Assessment   Patient Belief/Attitude about Diabetes Motivated to manage diabetes    What is the hardest part about your diabetes right now, causing you the most concern, or is the most worrisome to you about your diabetes?   Checking blood sugar    Self-care barriers Unable to determine    Self-management support Family    Patient Concerns Monitoring;Medication    Special Needs None    Preferred Learning Style No preference indicated    Learning Readiness Contemplating      Pre-Education Assessment   Patient understands the diabetes disease and treatment process. Comprehends key points    Patient understands incorporating  nutritional management into lifestyle. Comprehends key points    Patient undertands incorporating physical activity into lifestyle. Comprehends key points    Patient understands using medications safely. Comprehends key points    Patient understands monitoring blood glucose, interpreting and using results Comprehends key points    Patient understands prevention, detection, and treatment of acute complications. Demonstrates understanding / competency    Patient understands prevention, detection, and treatment of chronic complications. Compreheands key points    Patient understands how to develop strategies to address psychosocial issues. Comprehends key points    Patient understands how to develop strategies to promote health/change behavior. Demonstrates understanding / competency      Complications   Last HgB A1C per patient/outside source 8.2 %    How often do you check your blood sugar? 3-4 times / week   intermittently per her 14 days CGM download, she says she cannot find her meter   Have you had a dilated eye exam in the past 12 months? No    Have you had a dental exam in the past 12 months? No    Are you checking your feet? Yes    How many days per week are you checking your feet? 7      Patient Education   Previous  Diabetes Education Yes (please comment)   here in 2021 and before that as well   Medications Other (comment)   showed patient a 3 day Cecur insulin patch   Monitoring Taught/evaluated CGM (comment)   assisted her with calling [employer] about her reader problems. they are mailing her a new one.     Individualized Goals (developed by patient)   Monitoring  Test my blood glucose as discussed      Patient Self-Evaluation of Goals - Patient rates self as meeting previously set goals (% of time)   Monitoring 50 - 75 % (half of the time)      Post-Education Assessment   Patient understands the diabetes disease and treatment process. Comprehends key points    Patient understands  incorporating nutritional management into lifestyle. Comprehends key points    Patient undertands incorporating physical activity into lifestyle. Comprehends key points    Patient understands using medications safely. Comphrehends key points    Patient understands monitoring blood glucose, interpreting and using results Comprehends key points    Patient understands prevention, detection, and treatment of acute complications. Comprehends key points    Patient understands prevention, detection, and treatment of chronic complications. Comprehends key points    Patient understands how to develop strategies to address psychosocial issues. Comprehends key points    Patient understands how to develop strategies to promote health/change behavior. Comprehends key points      Outcomes   Program Status Completed      Subsequent Visit   Since your last visit have you continued or begun to take your medications as prescribed? Yes    Since your last visit have you had your blood pressure checked? Yes    Is your most recent blood pressure lower, unchanged, or higher since your last visit? Unchanged    Since your last visit have you experienced any weight changes? Gain    Weight Gain (lbs) 20    Since your last visit, are you checking your blood glucose at least once a day? No   having trouble finding her reader and now unsure if it is working correctly. we called [employer] and they are replacing her sensor and reader overnight.            Learning Objective:  Patient will have a greater understanding of diabetes self-management. Patient education plan is to attend individual and/or group sessions per assessed needs and concerns.   Plan:   There are no Patient Instructions on file for this visit.   Expected Outcomes:  Demonstrated interest in learning. Expect positive outcomes  Education material provided: Diabetes Resources  If problems or questions, patient to contact team via:  Phone  Future  DSME appointment: - Orion Crook Felicity Penix, RD 11/11/2021 4:42 PM.

## 2021-11-11 NOTE — Patient Instructions (Signed)
Thank you, Ms.[name] for allowing [place] to provide your care today. Today we discussed your diabetes, blood pressure, itching and vaccinations.  Your A1c is improving.  Continue working on dietary changes and exercise as previously instructed.  We will work with Butch Penny to help get you another meter.  Please follow-up in clinic after you have had your meter for at least 4 weeks.  Received the pneumonia vaccine today.  For your thyroid Gorder, I am repeating an ultrasound to further assess.  I have ordered the following tests: Ultrasound thyroid   My Chart Access: [email or web address]  Please follow-up in 4 weeks  Please make sure to arrive 15 minutes prior to your next appointment. If you arrive late, you may be asked to reschedule.    We look forward to seeing you next time. Please call our clinic at [phone] if you have any questions or concerns. The best time to call is Monday-Friday from 9am-4pm, but there is someone available 24/7. If after hours or the weekend, call the main hospital number and ask for the Internal Medicine Resident On-Call. If you need medication refills, please notify your pharmacy one week in advance and they will send [place] a request.   Thank you for letting us take part in your care. Wishing you the best!  Lacinda Axon, MD 11/11/2021, 3:16 PM IM Resident, PGY-2 Oswaldo Milian 41:10

## 2021-11-13 ENCOUNTER — Encounter: Payer: Self-pay | Admitting: Student

## 2021-11-13 ENCOUNTER — Telehealth: Payer: Self-pay | Admitting: Dietician

## 2021-11-13 ENCOUNTER — Ambulatory Visit
Admission: RE | Admit: 2021-11-13 | Discharge: 2021-11-13 | Disposition: A | Discharge status: Home / Self Care | Payer: Medicaid Other | Source: Ambulatory Visit | Attending: Internal Medicine | Admitting: Internal Medicine

## 2021-11-13 DIAGNOSIS — E041 Nontoxic single thyroid nodule: Secondary | ICD-10-CM | POA: Diagnosis not present

## 2021-11-13 DIAGNOSIS — E049 Nontoxic goiter, unspecified: Secondary | ICD-10-CM

## 2021-11-13 NOTE — Telephone Encounter (Signed)
Krista Sosa wanted to schedule a time for help with her CGM. Appointment made for tomorrow.

## 2021-11-13 NOTE — Assessment & Plan Note (Addendum)
A1c slightly improved from 8.5% 3 months ago to 8.2% today. Patient brought in her freestyle Elenor Legato but states it is not working well. The report shows 4% very high, 20% high, 72% in range and 4% low. CGM sensor was only active 33% of the time with average blood sugar of 147 from only 14 days of data, 6/6-6/19. Patient is scheduled to work with Butch Penny later today. Butch Penny will help patient get a replacement sensor. Patient advised to follow-up after a few weeks of data from CGM. Encouraged to continue lifestyle modifications with decreasing sugary foods and carbs as well as exercising as able.  Patient states she saw her eye doctor earlier this year and plans to call them to release her information to [place]. -Continue Tresiba 25 units daily -Continue Humalog 12 units in a.m. and 15 units with largest meal in the afternoon or evening -Continue Jardiance 25 mg daily -Continue Janumet 50-1000 mg twice daily -Follow-up in 3 months for repeat A1c, she will be due for diabetic foot exam

## 2021-11-13 NOTE — Assessment & Plan Note (Signed)
Due for repeat colonoscopy in 2024

## 2021-11-13 NOTE — Assessment & Plan Note (Signed)
Received the pneumonia vaccine today

## 2021-11-13 NOTE — Assessment & Plan Note (Signed)
Patient with a history of multinodular goiter with suspicious nodules.  It was recommended patient get follow-up thyroid ultrasound yearly but this was not done last year. -Repeat thyroid ultrasound

## 2021-11-13 NOTE — Assessment & Plan Note (Signed)
PHQ-9 score of 0 today.  States her mood has been stable. -Continue Cymbalta 60 mg daily -Repeat PHQ-9 at next office visit

## 2021-11-13 NOTE — Assessment & Plan Note (Signed)
Blood pressure remained stable with SBP in the 130s. She denies any headaches, dizziness or blurry vision.  -Consider restarting Benicar if BP elevated with SBP in the 140s to 150s at next office visit.

## 2021-11-14 ENCOUNTER — Encounter: Payer: Self-pay | Admitting: Dietician

## 2021-11-14 ENCOUNTER — Ambulatory Visit: Payer: Medicaid Other | Admitting: Dietician

## 2021-11-14 NOTE — Progress Notes (Signed)
Documentation: assisted with set up of new Continuous glucose monitoring reader and observed patient apply and start new sensor.  [name], RD 11/14/2021 4:23 PM.

## 2021-11-17 ENCOUNTER — Encounter: Payer: Self-pay | Admitting: *Deleted

## 2021-11-20 ENCOUNTER — Ambulatory Visit: Payer: Medicaid Other | Admitting: Behavioral Health

## 2021-11-20 DIAGNOSIS — F419 Anxiety disorder, unspecified: Secondary | ICD-10-CM

## 2021-11-20 DIAGNOSIS — F331 Major depressive disorder, recurrent, moderate: Secondary | ICD-10-CM

## 2021-11-20 NOTE — Progress Notes (Signed)
Internal Medicine Clinic Attending ° °Case discussed with Dr. Amponsah  at the time of the visit.  We reviewed the resident’s history and exam and pertinent patient test results.  I agree with the assessment, diagnosis, and plan of care documented in the resident’s note.  °

## 2021-11-20 NOTE — BH Specialist Note (Signed)
Integrated Behavioral Health via Telemedicine Visit  11/20/2021 JURLINE FOLGER [id number]  Number of [place] Clinician visits: 70 Session Start time: 1330 Session End time: 1400 Total time in minutes: 30 min  Referring Provider: Dr. Lily Kocher, MD Patient/Family location: Pt is home in private [facility] Provider location: [facility] Office All persons participating in visit: Pt & Clinician Types of Service: Individual psychotherapy  I connected with [name] and/or Wandra Arthurs [place]'s  self  via  Telephone or [employer]  (Video is Caregility application) and verified that I am speaking with the correct person using two identifiers. Discussed confidentiality: Yes   I discussed the limitations of telemedicine and the availability of in person appointments.  Discussed there is a possibility of technology failure and discussed alternative modes of communication if that failure occurs.  I discussed that engaging in this telemedicine visit, they consent to the provision of behavioral healthcare and the services will be billed under their insurance.  Patient and/or legal guardian expressed understanding and consented to Telemedicine visit: Yes   Presenting Concerns: Patient and/or family reports the following symptoms/concerns: elevated anx/dep due to neighbors beh in her Apt Complex Duration of problem: Pt reports Hx of 2 years of neighbors who make drugs; Severity of problem: moderate  Patient and/or Family's Strengths/Protective Factors: Social connections, Social and Emotional competence, Concrete supports in place (healthy food, safe environments, etc.), and Sense of purpose  Goals Addressed: Patient will:  Reduce symptoms of: anxiety, depression, and stress   Increase knowledge and/or ability of: coping skills and stress reduction   Demonstrate ability to: Increase healthy adjustment to current life circumstances  Progress towards  Goals: Ongoing  Interventions: Interventions utilized:  Supportive Counseling Standardized Assessments completed:  screeners prn  Patient and/or Family Response: Pt is receptive to call today & knows Clinician is transitioning w/in [facility].  Assessment: Patient currently experiencing elevated anx/dep due to housing situation. Landlord is renovating a [place] for Pt to move into soon.  Patient may benefit from cont'd ck-ins for mental health wellbeing.  Plan: Follow up with behavioral health clinician on : TBD by New Provider Behavioral recommendations: Cont to contact Landlord Referral(s): [place] (In Clinic)  I discussed the assessment and treatment plan with the patient and/or parent/guardian. They were provided an opportunity to ask questions and all were answered. They agreed with the plan and demonstrated an understanding of the instructions.   They were advised to call back or seek an in-person evaluation if the symptoms worsen or if the condition fails to improve as anticipated.  Donnetta Hutching, LMFT

## 2022-01-03 NOTE — Progress Notes (Signed)
Findings from thyroid ultrasound shows that 3 of the orders are less than 1 cm and do not meet criteria for surveillance. However, there are two goiters that are 1.0 cm and 1.3 cm and will require yearly surveillance with repeat thyroid ultrasound. Patient has been called and informed of the results. -Repeat  U/S thyroid in 1 year.

## 2022-03-04 ENCOUNTER — Encounter: Payer: Self-pay | Admitting: Internal Medicine

## 2022-03-04 ENCOUNTER — Other Ambulatory Visit: Payer: Self-pay

## 2022-03-04 ENCOUNTER — Encounter: Payer: Self-pay | Admitting: Dietician

## 2022-03-04 ENCOUNTER — Ambulatory Visit (INDEPENDENT_AMBULATORY_CARE_PROVIDER_SITE_OTHER): Payer: Medicare Other | Admitting: Internal Medicine

## 2022-03-04 VITALS — BP 126/65 | HR 93 | Temp 98.1°F | Ht 66.0 in | Wt 183.8 lb

## 2022-03-04 DIAGNOSIS — M25561 Pain in right knee: Secondary | ICD-10-CM

## 2022-03-04 DIAGNOSIS — R809 Proteinuria, unspecified: Secondary | ICD-10-CM

## 2022-03-04 DIAGNOSIS — Z23 Encounter for immunization: Secondary | ICD-10-CM | POA: Diagnosis not present

## 2022-03-04 DIAGNOSIS — G8929 Other chronic pain: Secondary | ICD-10-CM

## 2022-03-04 DIAGNOSIS — R1319 Other dysphagia: Secondary | ICD-10-CM

## 2022-03-04 DIAGNOSIS — E785 Hyperlipidemia, unspecified: Secondary | ICD-10-CM

## 2022-03-04 DIAGNOSIS — M792 Neuralgia and neuritis, unspecified: Secondary | ICD-10-CM | POA: Diagnosis not present

## 2022-03-04 DIAGNOSIS — I10 Essential (primary) hypertension: Secondary | ICD-10-CM | POA: Diagnosis not present

## 2022-03-04 DIAGNOSIS — E1129 Type 2 diabetes mellitus with other diabetic kidney complication: Secondary | ICD-10-CM | POA: Diagnosis not present

## 2022-03-04 DIAGNOSIS — E049 Nontoxic goiter, unspecified: Secondary | ICD-10-CM | POA: Diagnosis not present

## 2022-03-04 DIAGNOSIS — F17210 Nicotine dependence, cigarettes, uncomplicated: Secondary | ICD-10-CM

## 2022-03-04 LAB — POCT GLYCOSYLATED HEMOGLOBIN (HGB A1C): Hemoglobin A1C: 7.7 % — AB (ref 4.0–5.6)

## 2022-03-04 LAB — GLUCOSE, CAPILLARY: Glucose-Capillary: 115 mg/dL — ABNORMAL HIGH (ref 70–99)

## 2022-03-04 MED ORDER — OLMESARTAN MEDOXOMIL-HCTZ 20-12.5 MG PO TABS: 1.0000 | ORAL_TABLET | Freq: Every day | ORAL | 2 refills | Status: DC

## 2022-03-04 MED ORDER — DULOXETINE HCL 30 MG PO CPEP: 60.0000 mg | ORAL_CAPSULE | Freq: Every day | ORAL | 0 refills | Status: DC

## 2022-03-04 MED ORDER — INSULIN LISPRO (1 UNIT DIAL) 100 UNIT/ML (KWIKPEN): PEN_INJECTOR | SUBCUTANEOUS | 1 refills | Status: DC

## 2022-03-04 MED ORDER — TRESIBA FLEXTOUCH 100 UNIT/ML ~~LOC~~ SOPN: 25.0000 [IU] | PEN_INJECTOR | Freq: Every day | SUBCUTANEOUS | 3 refills | Status: DC

## 2022-03-04 NOTE — Progress Notes (Unsigned)
Eats biggest meal at 7p Only eats morning and at night 15 units / 24 units, has bene doing this for a while Lows started around 1 month ago Last OV was when insulin changes were made  Eating less  [facility] [address]--needs insulin, bp meds, cymbalta  Sensation of food getting stuck for about 2 months, solids  Ratio has been elevated for at least 10 years so don't need urine  Got shingles shots, both, earlier this year

## 2022-03-04 NOTE — Patient Instructions (Addendum)
Ms. Holecek,  It was a pleasure to care for you today! I am glad that you are overall doing well!  Please make sure that you are eating regular meals throughout the day to help prevent your blood sugar from getting too low.  I have sent a referral to the gastroenterologist to evaluate your feeling of food getting stuck in your chest.  I will let you know what your lab results show.  Please follow up in about 3 months, or sooner if needed.  My best, Dr. Marlou Sa

## 2022-03-04 NOTE — Progress Notes (Unsigned)
CC: routine check up, trouble swallowing  HPI:  Krista Sosa is a 71 y.o. person with past medical history as detailed below who presents today for routine check up. She is also concerned about new difficulty with her swallowing. Please see problem based charting for detailed assessment and plan.  Past Medical History:  Diagnosis Date   Allergy    Asthma, chronic 06/09/2006   Bilateral cataracts 05/07/2016   s/p left cataract extraction 10/02/2016 and right cataract extraction 10/16/2016   Bronchiectasis without complication ([place]) 58/85/0277   Mild left lower lobe bronchiectasis seen on CT scan of the thorax in December 2019   Cataract    bil eyes   Essential hypertension 02/12/2018   Gastroesophageal reflux disease 08/13/2011   Glaucoma    Per patient report   Hx of adenomatous colonic polyps 11/16/2008   Hypertension    pt said she is supposed to take med but she is not taking it.   Hypertensive retinopathy, grade 2, bilateral 05/07/2016   Inflamed epidermoid cyst of skin 07/02/2020   Latent tuberculosis    dx 03/17/08 - tx with INH X 9 months   Lichenoid dermatitis 02/13/2012   Previously treated with Triamcinolone 0.025% cream Q8-12H PRN    Migraine headache 08/06/2018   Osteoarthritis, generalized 05/21/2006   Diffuse: cervical spine, right shoulder, left wrist Ineffective medications: Tylenol, ASA, Naprosyn, Ibuprofen, Meloxicam     Overweight (BMI 25.0-29.9) 02/13/2012   Perennial allergic rhinitis with seasonal variation 06/09/2006   Persistent microalbuminuria associated with type II diabetes mellitus ([place]) 05/21/2006   Radiculopathy affecting upper extremity 01/02/2019   Sickle cell anemia ([place])    pt has sickle cell trait per pt   Sickle cell trait ([place]) 02/13/2012   Smoker    1/2ppd   Split ear lobe 01/04/2019   Tobacco abuse disorder 06/09/2006   Tubular adenoma of rectum 11/16/2008   5 mm, excised endoscopically 11/16/2008, repeat colonoscopy due July 2015    Ulnar  neuropathy of right upper extremity 02/14/2016   Urinary tract disease    JICP   Urticaria    Review of Systems:  Negative unless otherwise stated.  Physical Exam:  Vitals:   03/04/22 1451  BP: 126/65  Pulse: 93  Temp: 98.1 F (36.7 C)  TempSrc: Oral  SpO2: 99%  Weight: 183 lb 12.8 oz (83.4 kg)  Height: '5\' 6"'$  (1.676 m)   Constitutional:Appears well, stated age. In no acute distress. Cardio:Regular rate and rhythm. No murmurs, rubs, or gallops. Pulm:Clear to auscultation bilaterally. Normal work of breathing on room air. Abdomen:Soft, nontender, nondistended. AJO:INOMVEHM for extremity edema. Skin:Warm and dry. Neuro:Alert and oriented x3. No focal deficit noted. Psych:Pleasant mood and affect.   Assessment & Plan:   See Encounters Tab for problem based charting.  Essential hypertension BP at goal today, 126/65. Current regimen is olmesartan-HCTZ 20-12.5 mg daily. Renal function WNL in March 2023. Plan: Continue current regimen. Check BMP today.  Uncontrolled type 2 diabetes mellitus with microalbuminuria, with long-term current use of insulin (HCC) HbA1c 8.2% 10/2021, improved to 7.7% today. Current regimen is Jardiance 25 mg daily, sitagliptin-metformin 50-1000 mg BID, Tresiba 25 units daily, and Humalog 12 units in the morning and 15 units in the evening. She is due for diabetic foot exam and urine protein studies at this time.  CGM report from today was reviewed.  CGM data summary   Average is    145  for 14 days   Time sensor is active  65 %   Time in range (70-180 mg/dL):  71 % (Goal >70%)   Time High (181-250 mg/dL)  21 % (Goal < 25%)   Time Very High (>250 mg/dl)  3 % (Goal < 5%)   Time Low (54-69 mg/dL)  4 % (Goal is <4%)   Time Very Low (<54)  1%  (Goal <1%)   Review of detailed glucometer report shows that she has mostly midday lows with occasional overnight lows. She does not eat during the day and her meals are in the morning and evening. She states that  she is injecting 15 units in the morning and 24 units in the evening and has been since her last OV. She noticed that she was having more lows about 1 month ago. In general she has been eating less in an effort to have better control of her diabetes and because of a sensation of food being stuck in her chest (see separate assessment and plan).   She has had proteinuria for at least 10 years and is on an ACEI and will not need repeat urine studies today. Plan:Continue current regimen. Patient advised to eat regular meals including lunch, and educated on her her insulin affects her blood sugar throughout the day.   Hyperlipidemia Last lipid panel obtained 09/2019 with LDL at goal at 93. Current regimen is atorvastatin 40 mg daily. Plan:Continue current regimen. Check lipid panel today.  Major depressive disorder, recurrent episode, mild with anxious distress ([place]) Refill for Cymbalta sent in today.  Need for immunization against influenza Flu vaccine given today.  Need for tetanus booster Tetanus booster given today.  Esophageal dysphagia Patient reports experiencing the sensation of food getting stuck in her chest for the last ~2 months. She states this happens with solid food only and the area she feels that the food is getting stuck is around the center of her chest. She has not choked but the frequency of eating has decreased in this time due to the discomfort. She has acid reflux and has smoked 10 cigarettes per day for at least 25 years. She denies fevers, chills, or weight loss. Assessment: I am concerned that she may have some esophageal pathology such as an esophageal stricture or mass. Plan:Refer to GI for further evaluation.  Goiter Recommendation for repeat thyroid ultrasound in 1 year.  Patient discussed with Dr. Philipp Ovens

## 2022-03-05 DIAGNOSIS — R1319 Other dysphagia: Secondary | ICD-10-CM

## 2022-03-05 DIAGNOSIS — M792 Neuralgia and neuritis, unspecified: Secondary | ICD-10-CM | POA: Insufficient documentation

## 2022-03-05 DIAGNOSIS — G8929 Other chronic pain: Secondary | ICD-10-CM | POA: Insufficient documentation

## 2022-03-05 DIAGNOSIS — Z23 Encounter for immunization: Secondary | ICD-10-CM | POA: Insufficient documentation

## 2022-03-05 HISTORY — DX: Other dysphagia: R13.19

## 2022-03-05 LAB — BMP8+ANION GAP
Anion Gap: 13 mmol/L (ref 10.0–18.0)
BUN/Creatinine Ratio: 31 — ABNORMAL HIGH (ref 12–28)
BUN: 20 mg/dL (ref 8–27)
CO2: 22 mmol/L (ref 20–29)
Calcium: 9.6 mg/dL (ref 8.7–10.3)
Chloride: 104 mmol/L (ref 96–106)
Creatinine, Ser: 0.65 mg/dL (ref 0.57–1.00)
Glucose: 68 mg/dL — ABNORMAL LOW (ref 70–99)
Potassium: 4.3 mmol/L (ref 3.5–5.2)
Sodium: 139 mmol/L (ref 134–144)
eGFR: 95 mL/min/{1.73_m2} (ref >59)

## 2022-03-05 LAB — LIPID PANEL
Chol/HDL Ratio: 4 ratio (ref 0.0–4.4)
Cholesterol, Total: 216 mg/dL — ABNORMAL HIGH (ref 100–199)
HDL: 54 mg/dL (ref >39)
LDL Chol Calc (NIH): 136 mg/dL — ABNORMAL HIGH (ref 0–99)
Triglycerides: 148 mg/dL (ref 0–149)
VLDL Cholesterol Cal: 26 mg/dL (ref 5–40)

## 2022-03-05 NOTE — Assessment & Plan Note (Signed)
Recommendation for repeat thyroid ultrasound in 1 year.

## 2022-03-05 NOTE — Assessment & Plan Note (Signed)
Refill for Cymbalta sent in today.

## 2022-03-05 NOTE — Assessment & Plan Note (Signed)
Flu vaccine given today. 

## 2022-03-05 NOTE — Assessment & Plan Note (Signed)
Patient reports experiencing the sensation of food getting stuck in her chest for the last ~2 months. She states this happens with solid food only and the area she feels that the food is getting stuck is around the center of her chest. She has not choked but the frequency of eating has decreased in this time due to the discomfort. She has acid reflux and has smoked 10 cigarettes per day for at least 25 years. She denies fevers, chills, or weight loss. Assessment: I am concerned that she may have some esophageal pathology such as an esophageal stricture or mass. Plan:Refer to GI for further evaluation.

## 2022-03-05 NOTE — Assessment & Plan Note (Signed)
HbA1c 8.2% 10/2021, improved to 7.7% today. Current regimen is Jardiance 25 mg daily, sitagliptin-metformin 50-1000 mg BID, Tresiba 25 units daily, and Humalog 12 units in the morning and 15 units in the evening. She is due for diabetic foot exam and urine protein studies at this time.  CGM report from today was reviewed.  CGM data summary   Average is    145  for 14 days   Time sensor is active   65 %   Time in range (70-180 mg/dL):  71 % (Goal >70%)   Time High (181-250 mg/dL)  21 % (Goal < 25%)   Time Very High (>250 mg/dl)  3 % (Goal < 5%)   Time Low (54-69 mg/dL)  4 % (Goal is <4%)   Time Very Low (<54)  1%  (Goal <1%)   Review of detailed glucometer report shows that she has mostly midday lows with occasional overnight lows. She does not eat during the day and her meals are in the morning and evening. She states that she is injecting 15 units in the morning and 24 units in the evening and has been since her last OV. She noticed that she was having more lows about 1 month ago. In general she has been eating less in an effort to have better control of her diabetes and because of a sensation of food being stuck in her chest (see separate assessment and plan).   She has had proteinuria for at least 10 years and is on an ACEI and will not need repeat urine studies today. Plan:Continue current regimen. Patient advised to eat regular meals including lunch, and educated on her her insulin affects her blood sugar throughout the day.

## 2022-03-05 NOTE — Assessment & Plan Note (Signed)
Last lipid panel obtained 09/2019 with LDL at goal at 93. Current regimen is atorvastatin 40 mg daily. Plan:Continue current regimen. Check lipid panel today.

## 2022-03-05 NOTE — Assessment & Plan Note (Signed)
Tetanus booster given today. 

## 2022-03-05 NOTE — Assessment & Plan Note (Signed)
BP at goal today, 126/65. Current regimen is olmesartan-HCTZ 20-12.5 mg daily. Renal function WNL in March 2023. Plan: Continue current regimen. Check BMP today.

## 2022-03-06 ENCOUNTER — Telehealth: Payer: Self-pay | Admitting: Internal Medicine

## 2022-03-06 MED ORDER — ATORVASTATIN CALCIUM 80 MG PO TABS: 80.0000 mg | ORAL_TABLET | Freq: Every day | ORAL | 11 refills | Status: DC

## 2022-03-06 NOTE — Progress Notes (Signed)
LDL remains above goal. Patient is currently on atorvastatin 40 mg daily. Will increase to 80 mg daily and recheck at next OV.

## 2022-03-06 NOTE — Telephone Encounter (Signed)
Discussed lab results from recent OV with patient and new medication, atorvastatin 80 mg daily. All questions answered.  Farrel Gordon, DO

## 2022-03-07 NOTE — Progress Notes (Signed)
Internal Medicine Clinic Attending ? ?Case discussed with Dr. Dean  At the time of the visit.  We reviewed the resident?s history and exam and pertinent patient test results.  I agree with the assessment, diagnosis, and plan of care documented in the resident?s note.  ?

## 2022-03-13 IMAGING — MG MM DIGITAL DIAGNOSTIC UNILAT*L* W/ TOMO W/ CAD
6 of 12 series · 6 of 36 positions shown · non-contrast
Comparison: Previous exam(s).

CLINICAL DATA: Recall from screening mammography with
tomosynthesis, possible masslike asymmetry in the UPPER LEFT breast
at far POSTERIOR depth visible only on the MLO images.

EXAM:
DIGITAL DIAGNOSTIC LEFT MAMMOGRAM WITH CAD AND TOMO
ULTRASOUND LEFT BREAST

[L ML synth-2D]
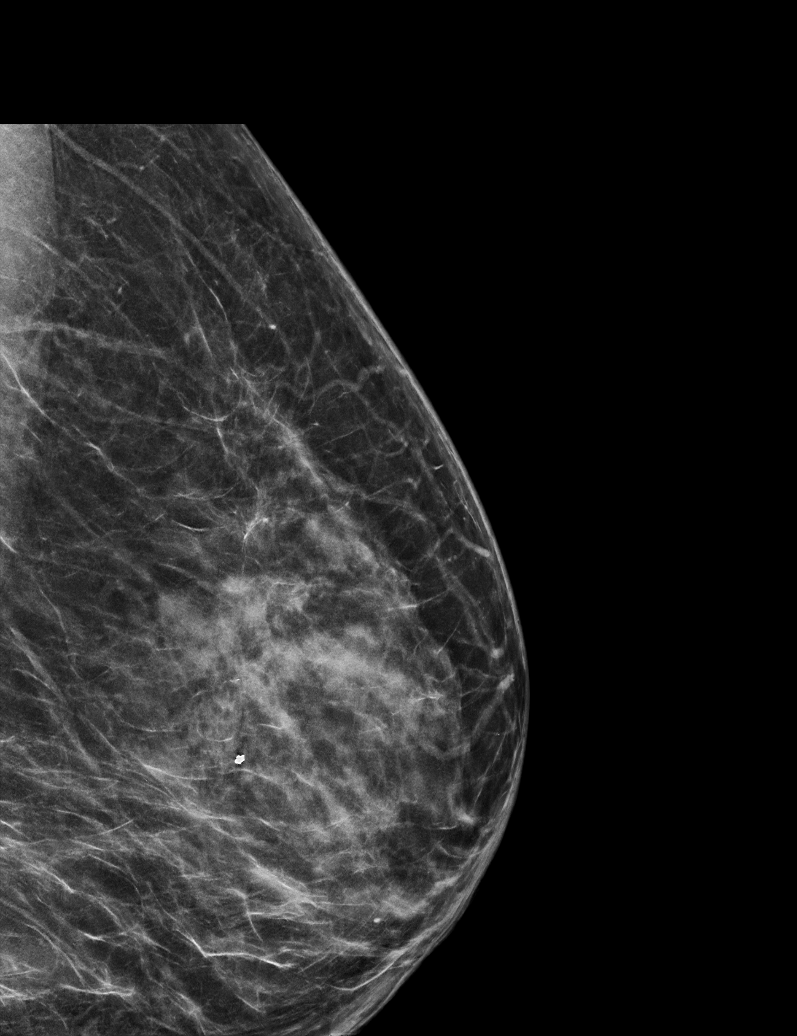

[L MLO synth-2D (1 of 2)]
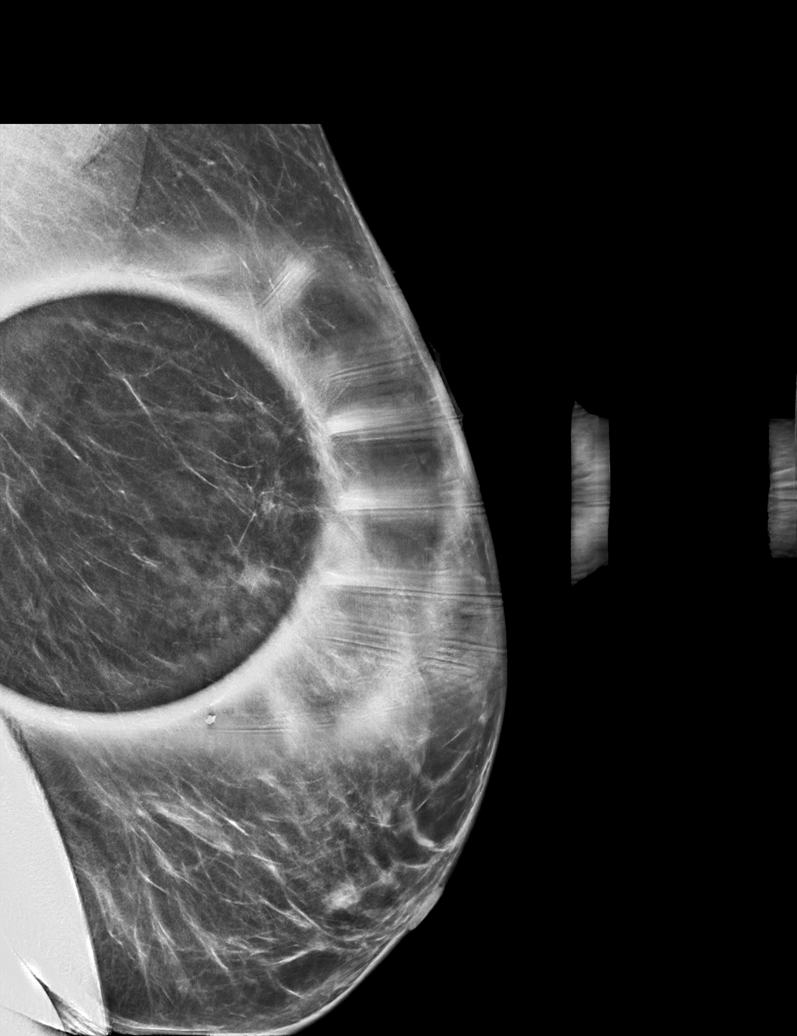

[L MLO synth-2D (2 of 2)]
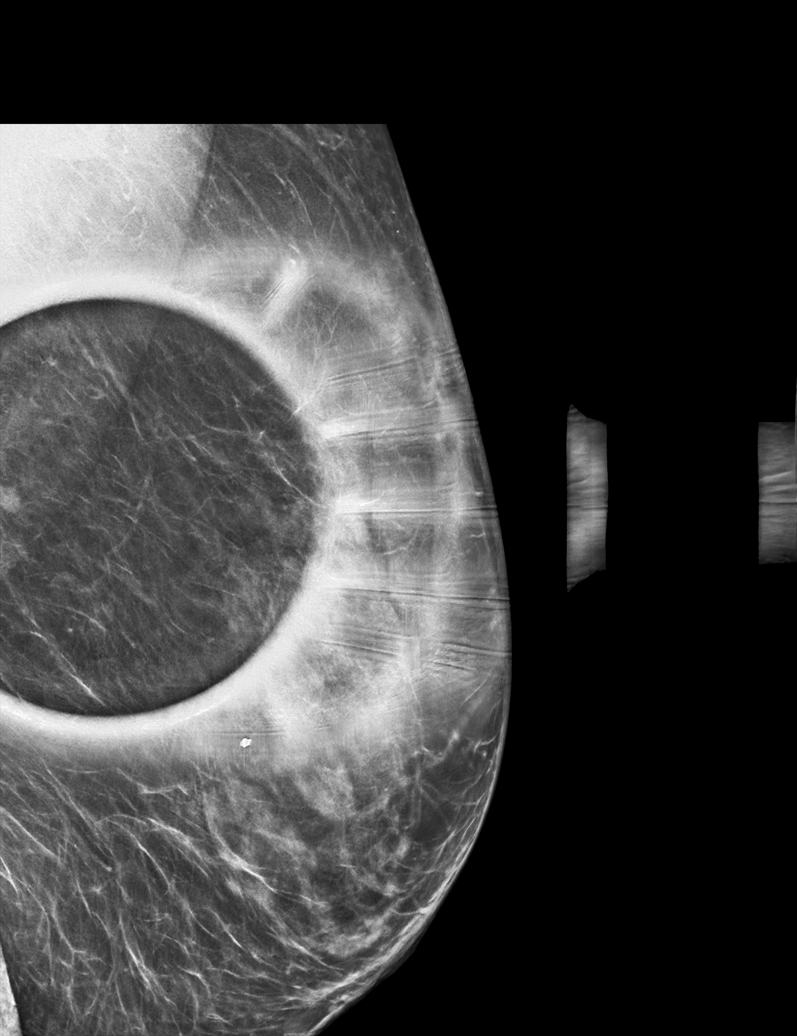

[L LM synth-2D]
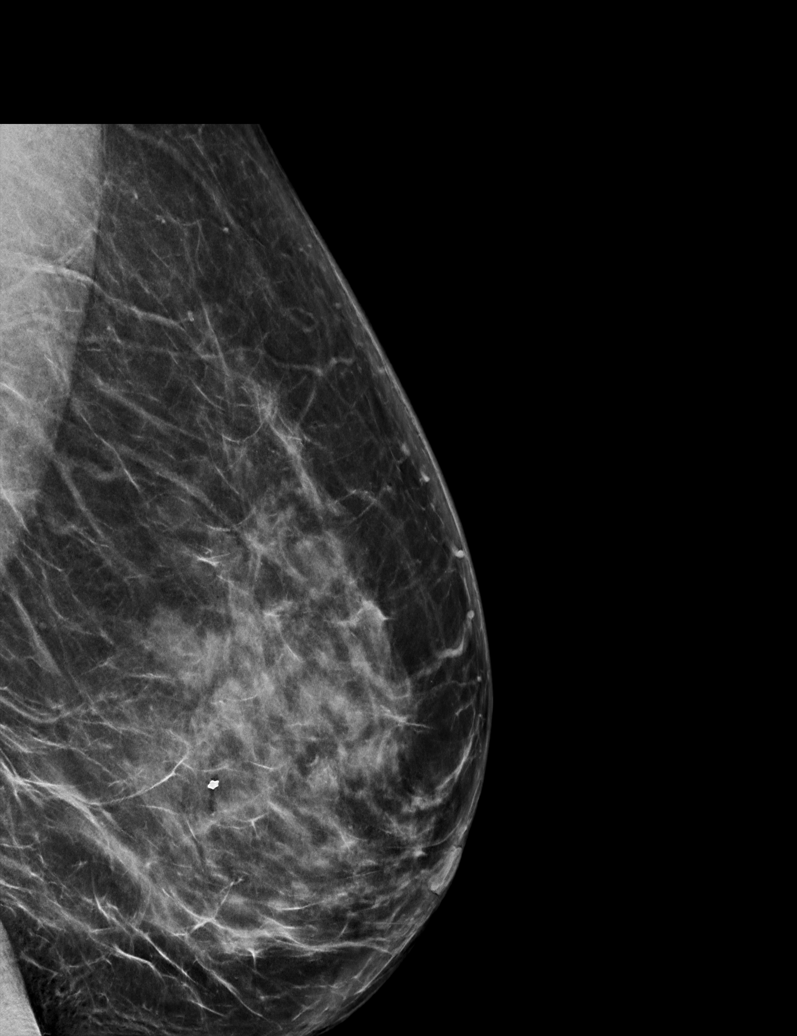

[L XCCL synth-2D (1 of 2)]
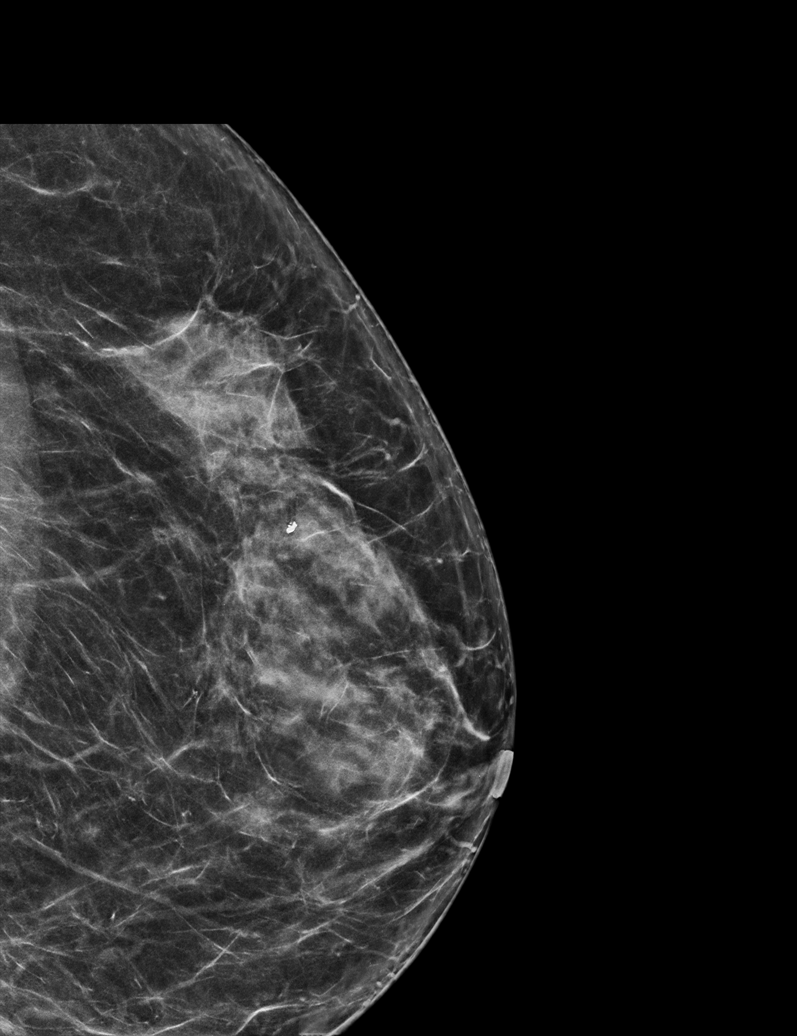

[L XCCL synth-2D (2 of 2)]
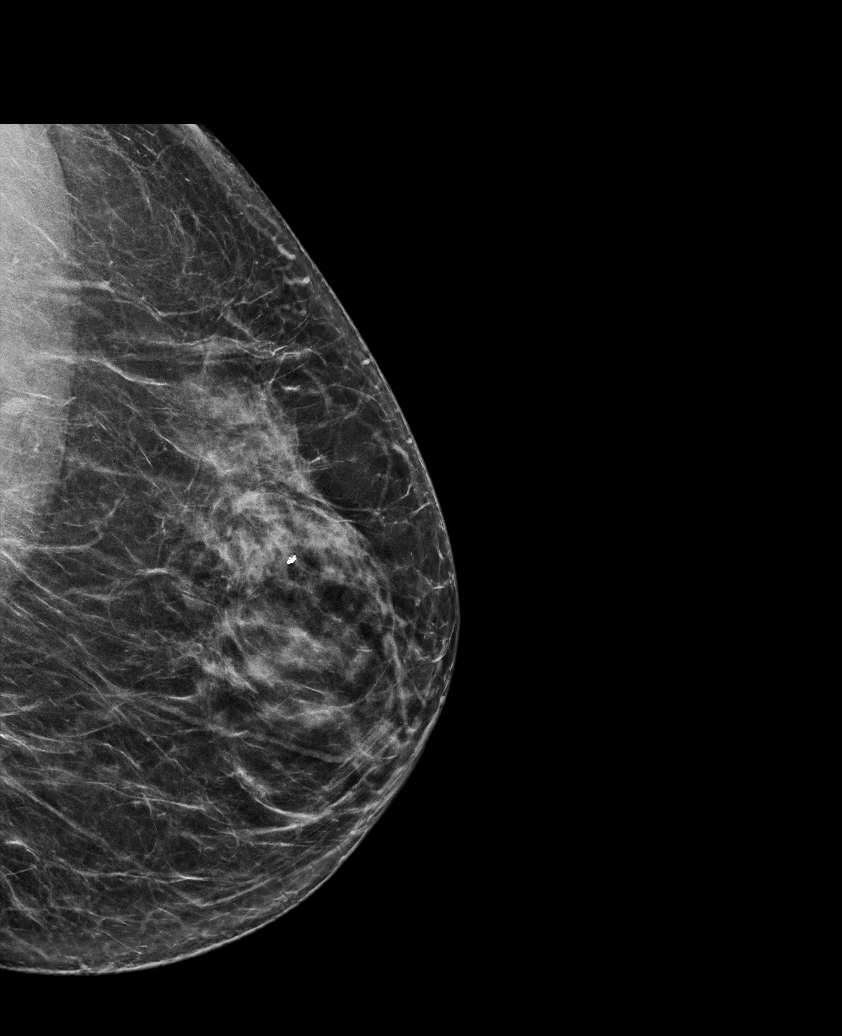

[6 of 36 positions shown; findings below may reference images not displayed]

ACR Breast Density Category c: The breast tissue is heterogeneously
dense, which may obscure small masses.
FINDINGS: Tomosynthesis and synthesized digital 2D spot compression MLO view
of the area of concern in the LEFT breast and tomosynthesis and
synthesized digital 2D full field mediolateral, lateral-medial, and
laterally exaggerated CC views of the LEFT breast were obtained.
Mammographic images were processed with CAD.

The spot compression images confirm a circumscribed isodense mass in
the UPPER breast at far POSTERIOR depth measuring approximately 6-7
mm. There is no associated architectural distortion or suspicious
calcifications. The mass is not conspicuous on the full field
mediolateral or lateral-medial images. The mass localizes to the
OUTER breast on the laterally exaggerated CC images, therefore
likely at or near the 2 o'clock to 3 o'clock location.

Targeted LEFT breast ultrasound is performed, showing a normal
intramammary lymph node with normal cortical thickness at the 2:30-3
o'clock position approximately 13 cm from the nipple at far
POSTERIOR depth, measuring approximately 6 x 3 x 5 mm, demonstrating
a normal fatty hilus and normal cortical thickness. No suspicious
solid mass or abnormal acoustic shadowing is identified.
IMPRESSION: Normal appearing 6 mm intramammary lymph node in the OUTER LEFT
breast at far POSTERIOR depth which accounts for the screening
mammographic finding.

RECOMMENDATION:
Screening mammogram in one year.(Code:R5-8-1XD)

I have discussed the findings and recommendations with the patient.
If applicable, a reminder letter will be sent to the patient
regarding the next appointment.

BI-RADS CATEGORY  2: Benign.

## 2022-03-26 ENCOUNTER — Other Ambulatory Visit: Payer: Self-pay | Admitting: Internal Medicine

## 2022-03-26 DIAGNOSIS — Z1231 Encounter for screening mammogram for malignant neoplasm of breast: Secondary | ICD-10-CM

## 2022-04-29 ENCOUNTER — Encounter: Payer: Self-pay | Admitting: Physician Assistant

## 2022-05-06 ENCOUNTER — Encounter (HOSPITAL_COMMUNITY): Payer: Self-pay

## 2022-05-06 ENCOUNTER — Ambulatory Visit (HOSPITAL_COMMUNITY)
Admission: EM | Admit: 2022-05-06 | Discharge: 2022-05-06 | Disposition: A | Discharge status: Home / Self Care | Payer: Medicare Other | Attending: Family Medicine | Admitting: Family Medicine

## 2022-05-06 ENCOUNTER — Ambulatory Visit (INDEPENDENT_AMBULATORY_CARE_PROVIDER_SITE_OTHER): Payer: Medicare Other

## 2022-05-06 DIAGNOSIS — J4521 Mild intermittent asthma with (acute) exacerbation: Secondary | ICD-10-CM

## 2022-05-06 DIAGNOSIS — R059 Cough, unspecified: Secondary | ICD-10-CM | POA: Diagnosis not present

## 2022-05-06 DIAGNOSIS — J069 Acute upper respiratory infection, unspecified: Secondary | ICD-10-CM | POA: Diagnosis not present

## 2022-05-06 MED ORDER — PREDNISONE 20 MG PO TABS: 40.0000 mg | ORAL_TABLET | Freq: Every day | ORAL | 0 refills | Status: DC

## 2022-05-06 MED ORDER — HYDROCODONE BIT-HOMATROP MBR 5-1.5 MG/5ML PO SOLN: ORAL | 0 refills | Status: DC

## 2022-05-06 NOTE — ED Triage Notes (Signed)
Pt is here for sore throat , bilateral  ear pain , knot on the back of head, neck pain, nasal congestion, ,cough , chest congestion body aches and chillsx 2wks

## 2022-05-06 NOTE — Discharge Instructions (Signed)
Be aware, your cough medication may cause drowsiness. Please do not drive, operate heavy machinery or make important decisions while on this medication as it may cloud your judgement.

## 2022-05-07 NOTE — ED Provider Notes (Signed)
Krista Sosa   [id number] 05/06/22 Arrival Time: 0998  ASSESSMENT & PLAN:  1. Mild intermittent asthma with acute exacerbation   2. Viral URI with cough    Discussed typical duration of likely viral illness. OTC symptom care as needed.  Discharge Medication List as of 05/06/2022  4:53 PM     START taking these medications   Details  HYDROcodone bit-homatropine (HYCODAN) 5-1.5 MG/5ML syrup Take 2.5 to 5 mL by mouth every 6 hours as needed for cough., Normal    predniSONE (DELTASONE) 20 MG tablet Take 2 tablets (40 mg total) by mouth daily., Starting Tue 05/06/2022, Normal       I have personally viewed the imaging studies ordered this visit. Normal CXR. No signs of PNA.   Follow-up Information     Krista, Saad, MD.   Why: If worsening or failing to improve as anticipated. Contact information: [place] [address] [phone]                 Reviewed expectations re: course of current medical issues. Questions answered. Outlined signs and symptoms indicating need for more acute intervention. Understanding verbalized. After Visit Summary given.   SUBJECTIVE: History from: Patient. Krista Sosa is a 71 y.o. female. Reports: Pt is here for sore throat, bilateral  ear pain, nasal congestion, cough, chest congestion body aches and chills; on/off past two weeks. Is wheezing. Cough is affecting sleep. No tx PTA. Denies: fever. Normal PO intake without n/v/d. No specific SOB. No CP.  OBJECTIVE:  Vitals:   05/06/22 1529  BP: (!) 145/74  Pulse: 80  Resp: 16  Temp: 99 F (37.2 C)  TempSrc: Oral  SpO2: 96%    General appearance: alert; no distress Eyes: PERRLA; EOMI; conjunctiva normal HENT: [place]; AT; with mild nasal congestion Neck: supple  Lungs: speaks full sentences without difficulty; unlabored; mild to moderate bilat exp wheezing Extremities: no edema Skin: warm and dry Neurologic: normal gait Psychological: alert  and cooperative; normal mood and affect  Imaging: DG Chest 2 View  Result Date: 05/06/2022 CLINICAL DATA:  Cough for 2 weeks. EXAM: CHEST - 2 VIEW COMPARISON:  Chest two views 06/17/2018, CT chest 05/11/2018 FINDINGS: Cardiac silhouette and mediastinal contours are within normal limits. The lungs are clear. No pleural effusion or pneumothorax. Mild-to-moderate anterior wedging of the T6 vertebral body, unchanged from prior radiographs and CT. IMPRESSION: No active cardiopulmonary disease. Electronically Signed   By: Yvonne Kendall M.D.   On: 05/06/2022 16:13    Allergies  Allergen Reactions   Pineapple     Past Medical History:  Diagnosis Date   Allergy    Asthma, chronic 06/09/2006   Bilateral cataracts 05/07/2016   s/p left cataract extraction 10/02/2016 and right cataract extraction 10/16/2016   Bronchiectasis without complication ([place]) 93/79/0240   Mild left lower lobe bronchiectasis seen on CT scan of the thorax in December 2019   Cataract    bil eyes   Essential hypertension 02/12/2018   Gastroesophageal reflux disease 08/13/2011   Glaucoma    Per patient report   Hx of adenomatous colonic polyps 11/16/2008   Hypertension    pt said she is supposed to take med but she is not taking it.   Hypertensive retinopathy, grade 2, bilateral 05/07/2016   Inflamed epidermoid cyst of skin 07/02/2020   Latent tuberculosis    dx 03/17/08 - tx with INH X 9 months   Lichenoid dermatitis 02/13/2012   Previously treated with Triamcinolone  0.025% cream Q8-12H PRN    Migraine headache 08/06/2018   Osteoarthritis, generalized 05/21/2006   Diffuse: cervical spine, right shoulder, left wrist Ineffective medications: Tylenol, ASA, Naprosyn, Ibuprofen, Meloxicam     Overweight (BMI 25.0-29.9) 02/13/2012   Perennial allergic rhinitis with seasonal variation 06/09/2006   Persistent microalbuminuria associated with type II diabetes mellitus ([place]) 05/21/2006   Radiculopathy affecting upper extremity 01/02/2019    Sickle cell anemia (HCC)    pt has sickle cell trait per pt   Sickle cell trait ([place]) 02/13/2012   Smoker    1/2ppd   Split ear lobe 01/04/2019   Tobacco abuse disorder 06/09/2006   Tubular adenoma of rectum 11/16/2008   5 mm, excised endoscopically 11/16/2008, repeat colonoscopy due July 2015    Ulnar neuropathy of right upper extremity 02/14/2016   Urinary tract disease    JICP   Urticaria    Social History   Socioeconomic History   Marital status: Single    Spouse name: Not on file   Number of children: Not on file   Years of education: Not on file   Highest education level: 11th grade  Occupational History   Occupation: Retired    Comment: Chief Executive Officer   Occupation: Retired    Comment: [employer]  Tobacco Use   Smoking status: Every Day    Packs/day: 0.50    Years: 25.00    Total pack years: 12.50    Types: Cigarettes   Smokeless tobacco: Never   Tobacco comments:    .5 ppd  Vaping Use   Vaping Use: Never used  Substance and Sexual Activity   Alcohol use: Yes    Alcohol/week: 0.0 standard drinks of alcohol    Comment: occasionally   Drug use: No    Comment: positive marijuana metabolite in 2010   Sexual activity: Not Currently    Birth control/protection: Other-see comments, Post-menopausal    Comment: s/p tubal ligation  Other Topics Concern   Not on file  Social History Narrative   Current Social History 09/05/2020       Patient lives alone in a ground floor apartment. There is one step down without handrails to the entrance the patient uses.       Patient's method of transportation is personal car.      The highest level of education was 11 th grade      The patient currently retired from [employer], and the [employer].      Identified important Relationships are "Aldona Bar and Mingo Amber"       Pets : Tom [place] "[employer] / Fun: "Walking at the park"       Current Stressors: "I have no stress; I'm good"       Religious / Personal  Beliefs: "Holiness"       L. [place], BSN, RN-BC    Social Determinants of Health   Financial Resource Strain: Not on file  Food Insecurity: Not on file  Transportation Needs: Not on file  Physical Activity: Not on file  Stress: Not on file  Social Connections: Not on file  Intimate Partner Violence: Not on file   Family History  Problem Relation Age of Onset   Diabetes Mother    Heart disease Mother 74   Colon cancer Mother    Cancer Mother        Colon   Cancer Father        Lung   Sickle cell anemia  Sister    Sickle cell anemia Brother    Diabetes Sister    Brain cancer Sister    Cancer Sister        brain   Lung cancer Brother    Cancer Brother        Lung   Healthy Sister    Healthy Sister    Healthy Sister    Healthy Daughter    Healthy Son    Healthy Daughter    Healthy Daughter    Healthy Son    Wilson's disease Maternal Uncle    Breast cancer Maternal Aunt    Breast cancer Other    Esophageal cancer Neg Hx    Rectal cancer Neg Hx    Stomach cancer Neg Hx    Past Surgical History:  Procedure Laterality Date   APPENDECTOMY     CATARACT EXTRACTION, BILATERAL Bilateral    OS 10/10/2016, OD 10/16/2016   COLONOSCOPY  11/16/08   EXCISION OF KELOID N/A 09/19/2020   Procedure: EXCISION OF CHRONICALLY INFECTED UPPER BACK CYST;  Surgeon: Coralie Keens, MD;  Location: [facility];  Service: General;  Laterality: N/A;   FOOT SURGERY  07/31/2008   Exostectomy right hallus   TONSILLECTOMY     TUBAL LIGATION       Vanessa Kick, MD 05/07/22 1030

## 2022-05-22 ENCOUNTER — Ambulatory Visit
Admission: RE | Admit: 2022-05-22 | Discharge: 2022-05-22 | Disposition: A | Discharge status: Home / Self Care | Payer: Medicare Other | Source: Ambulatory Visit | Attending: Internal Medicine | Admitting: Internal Medicine

## 2022-05-22 DIAGNOSIS — Z1231 Encounter for screening mammogram for malignant neoplasm of breast: Secondary | ICD-10-CM | POA: Diagnosis not present

## 2022-05-29 ENCOUNTER — Other Ambulatory Visit: Payer: Self-pay | Admitting: Internal Medicine

## 2022-05-29 DIAGNOSIS — I10 Essential (primary) hypertension: Secondary | ICD-10-CM

## 2022-06-03 ENCOUNTER — Ambulatory Visit: Payer: Medicaid Other | Admitting: Physician Assistant

## 2022-06-24 ENCOUNTER — Ambulatory Visit (INDEPENDENT_AMBULATORY_CARE_PROVIDER_SITE_OTHER): Payer: 59 | Admitting: Physician Assistant

## 2022-06-24 ENCOUNTER — Encounter: Payer: Self-pay | Admitting: Physician Assistant

## 2022-06-24 ENCOUNTER — Other Ambulatory Visit: Payer: Self-pay | Admitting: Internal Medicine

## 2022-06-24 VITALS — BP 140/70 | HR 86 | Ht 66.0 in | Wt 187.0 lb

## 2022-06-24 DIAGNOSIS — K219 Gastro-esophageal reflux disease without esophagitis: Secondary | ICD-10-CM

## 2022-06-24 DIAGNOSIS — R1310 Dysphagia, unspecified: Secondary | ICD-10-CM

## 2022-06-24 DIAGNOSIS — E1129 Type 2 diabetes mellitus with other diabetic kidney complication: Secondary | ICD-10-CM

## 2022-06-24 DIAGNOSIS — Z86010 Personal history of colonic polyps: Secondary | ICD-10-CM | POA: Diagnosis not present

## 2022-06-24 MED ORDER — OMEPRAZOLE 40 MG PO CPDR: 40.0000 mg | DELAYED_RELEASE_CAPSULE | Freq: Two times a day (BID) | ORAL | 3 refills | Status: DC

## 2022-06-24 NOTE — Patient Instructions (Addendum)
You have been scheduled for a Barium Esophogram at [facility] (1st floor of the hospital) on 07/04/22 at 10:00 am. Please arrive 30 minutes prior to your appointment for registration. Make certain not to have anything to eat or drink 3 hours prior to your test. If you need to reschedule for any reason, please contact radiology at [phone] to do so. __________________________________________________________________ A barium swallow is an examination that concentrates on views of the esophagus. This tends to be a double contrast exam (barium and two liquids which, when combined, create a gas to distend the wall of the oesophagus) or single contrast (non-ionic iodine based). The study is usually tailored to your symptoms so a good history is essential. Attention is paid during the study to the form, structure and configuration of the esophagus, looking for functional disorders (such as aspiration, dysphagia, achalasia, motility and reflux) EXAMINATION You may be asked to change into a gown, depending on the type of swallow being performed. A radiologist and radiographer will perform the procedure. The radiologist will advise you of the type of contrast selected for your procedure and direct you during the exam. You will be asked to stand, sit or lie in several different positions and to hold a small amount of fluid in your mouth before being asked to swallow while the imaging is performed .In some instances you may be asked to swallow barium coated marshmallows to assess the motility of a solid food bolus. The exam can be recorded as a digital or video fluoroscopy procedure. POST PROCEDURE It will take 1-2 days for the barium to pass through your system. To facilitate this, it is important, unless otherwise directed, to increase your fluids for the next 24-48hrs and to resume your normal diet.  This test typically takes about 30 minutes to perform.  We have sent the following medications to your  pharmacy for you to pick up at your convenience:   Start Omeprazole 40 mg take 1 capsule daily 30 minutes before meals  Discontinue Pepcid   If your blood pressure at your visit was 140/90 or greater, please contact your primary care physician to follow up on this.  _______________________________________________________  If you are age 90 or older, your body mass index should be between 23-30. Your Body mass index is 30.18 kg/m. If this is out of the aforementioned range listed, please consider follow up with your Primary Care Provider.  If you are age 62 or younger, your body mass index should be between 19-25. Your Body mass index is 30.18 kg/m. If this is out of the aformentioned range listed, please consider follow up with your Primary Care Provider.   ________________________________________________________  The  GI providers would like to encourage you to use [facility] to communicate with providers for non-urgent requests or questions.  Due to long hold times on the telephone, sending your provider a message by [facility] may be a faster and more efficient way to get a response.  Please allow 48 business hours for a response.  Please remember that this is for non-urgent requests.   Due to recent changes in healthcare laws, you may see the results of your imaging and laboratory studies on MyChart before your provider has had a chance to review them.  We understand that in some cases there may be results that are confusing or concerning to you. Not all laboratory results come back in the same time frame and the provider may be waiting for multiple results in order to interpret  others.  Please give [place] 48 hours in order for your provider to thoroughly review all the results before contacting the office for clarification of your results.    Thank you for entrusting me with your care and choosing [facility].  Ellouise Newer PA-C

## 2022-06-24 NOTE — Progress Notes (Signed)
Chief Complaint: Dysphagia  HPI:    Krista Sosa is a 72 year old African-American female with past medical history as listed below, known to Dr. Carlean Purl, who was referred to me by Nooruddin, Marlene Lard, MD for a complaint of dysphagia.      12/20/2015 colonoscopy with two 3-4 mm polyps in the descending colon, moderate diverticulosis in the sigmoid colon otherwise normal.  Pathology showed 1 diminutive adenoma and 1 mucosal polyp, recall recommended in 7 years.  (12/20/2022)    Today, the patient tells me over the past year she feels like her throat will hurt at times and her stomach hurts occasionally but most importantly she feels like when she eats any solid food she is having trouble with it getting stuck on the way down and then will get some saliva or mucus that comes back up in her throat.  This also happens with pills.  It is becoming more frequent over the past year.  She does take Pepcid 20 mg twice a day over the past month or so but does not feel like this is really helping.    Denies fever, chills, weight loss, blood in her stool, nausea, vomiting or symptoms that awaken her from sleep.  Past Medical History:  Diagnosis Date   Allergy    Asthma, chronic 06/09/2006   Bilateral cataracts 05/07/2016   s/p left cataract extraction 10/02/2016 and right cataract extraction 10/16/2016   Bronchiectasis without complication ([place]) 16/02/9603   Mild left lower lobe bronchiectasis seen on CT scan of the thorax in December 2019   Cataract    bil eyes   Essential hypertension 02/12/2018   Gastroesophageal reflux disease 08/13/2011   Glaucoma    Per patient report   Hx of adenomatous colonic polyps 11/16/2008   Hypertension    pt said she is supposed to take med but she is not taking it.   Hypertensive retinopathy, grade 2, bilateral 05/07/2016   Inflamed epidermoid cyst of skin 07/02/2020   Latent tuberculosis    dx 03/17/08 - tx with INH X 9 months   Lichenoid dermatitis 02/13/2012   Previously  treated with Triamcinolone 0.025% cream Q8-12H PRN    Migraine headache 08/06/2018   Osteoarthritis, generalized 05/21/2006   Diffuse: cervical spine, right shoulder, left wrist Ineffective medications: Tylenol, ASA, Naprosyn, Ibuprofen, Meloxicam     Overweight (BMI 25.0-29.9) 02/13/2012   Perennial allergic rhinitis with seasonal variation 06/09/2006   Persistent microalbuminuria associated with type II diabetes mellitus ([place]) 05/21/2006   Radiculopathy affecting upper extremity 01/02/2019   Sickle cell anemia ([place])    pt has sickle cell trait per pt   Sickle cell trait ([place]) 02/13/2012   Smoker    1/2ppd   Split ear lobe 01/04/2019   Tobacco abuse disorder 06/09/2006   Tubular adenoma of rectum 11/16/2008   5 mm, excised endoscopically 11/16/2008, repeat colonoscopy due July 2015    Ulnar neuropathy of right upper extremity 02/14/2016   Urinary tract disease    JICP   Urticaria     Past Surgical History:  Procedure Laterality Date   APPENDECTOMY     CATARACT EXTRACTION, BILATERAL Bilateral    OS 10/10/2016, OD 10/16/2016   COLONOSCOPY  11/16/08   EXCISION OF KELOID N/A 09/19/2020   Procedure: EXCISION OF CHRONICALLY INFECTED UPPER BACK CYST;  Surgeon: Coralie Keens, MD;  Location: [facility];  Service: General;  Laterality: N/A;   FOOT SURGERY  07/31/2008   Exostectomy right hallus   TONSILLECTOMY  TUBAL LIGATION      Current Outpatient Medications  Medication Sig Dispense Refill   albuterol (VENTOLIN HFA) 108 (90 Base) MCG/ACT inhaler Inhale 1-2 puffs into the lungs every 4 (four) hours as needed for wheezing or shortness of breath (coughing fits). Use with spacer. 1 each 1   atorvastatin (LIPITOR) 80 MG tablet Take 1 tablet (80 mg total) by mouth daily. 30 tablet 11   clotrimazole-betamethasone (LOTRISONE) cream Apply 1 application. topically 2 (two) times daily. 30 g 0   Continuous Blood Gluc Sensor (FREESTYLE LIBRE 2 SENSOR) MISC USE TO CHECK BLOOD GLUCOSE AT  LEAST 6 TIMES A DAY 6 each 3   diclofenac Sodium (VOLTAREN) 1 % GEL Apply 4 g topically 4 (four) times daily. 150 g 2   DULoxetine (CYMBALTA) 30 MG capsule Take 2 capsules (60 mg total) by mouth daily. 180 capsule 0   empagliflozin (JARDIANCE) 25 MG TABS tablet Take 1 tablet (25 mg total) by mouth daily. 90 tablet 3   famotidine (PEPCID) 20 MG tablet Take 20 mg by mouth 2 (two) times daily.     Fluticasone-Umeclidin-Vilant (TRELEGY ELLIPTA) 200-62.5-25 MCG/INH AEPB Inhale 1 puff into the lungs daily. Rinse mouth after each use. 60 each 3   HYDROcodone bit-homatropine (HYCODAN) 5-1.5 MG/5ML syrup Take 2.5 to 5 mL by mouth every 6 hours as needed for cough. 90 mL 0   insulin degludec (TRESIBA FLEXTOUCH) 100 UNIT/ML FlexTouch Pen Inject 25 Units into the skin daily. 15 mL 3   insulin lispro (HUMALOG KWIKPEN) 100 UNIT/ML KwikPen INJECT 12 UNITS IN THE MORNING AND 15 UNITS IN THE AFTERNOON WITH LARGEST MEALS 45 mL 1   Insulin Pen Needle (BD PEN NEEDLE NANO 2ND GEN) 32G X 4 MM MISC Use to inject insulin 3 times daily 300 each 3   olmesartan-hydrochlorothiazide (BENICAR HCT) 20-12.5 MG tablet TAKE 1 TABLET BY MOUTH EVERY DAY 90 tablet 0   predniSONE (DELTASONE) 20 MG tablet Take 2 tablets (40 mg total) by mouth daily. 10 tablet 0   sitaGLIPtin-metformin (JANUMET) 50-1000 MG tablet Take 1 tablet by mouth 2 (two) times daily with a meal. 180 tablet 3   Skin Protectants, Misc. (EUCERIN) cream Apply topically as needed for dry skin. 454 g 1   triamcinolone (KENALOG) 0.025 % ointment APPLY TOPICALLY TWICE DAILY AS DIRECTED (Patient not taking: Reported on 01/03/2021) 30 g 3   No current facility-administered medications for this visit.    Allergies as of 06/24/2022 - Review Complete 05/06/2022  Allergen Reaction Noted   Pineapple  10/04/2020    Family History  Problem Relation Age of Onset   Diabetes Mother    Heart disease Mother 31   Colon cancer Mother    Cancer Mother        Colon   Cancer  Father        Lung   Sickle cell anemia Sister    Sickle cell anemia Brother    Diabetes Sister    Brain cancer Sister    Cancer Sister        brain   Lung cancer Brother    Cancer Brother        Lung   Healthy Sister    Healthy Sister    Healthy Sister    Healthy Daughter    Healthy Son    Healthy Daughter    Healthy Daughter    Healthy Son    Wilson's disease Maternal Uncle    Breast cancer Maternal Aunt  Breast cancer Other    Esophageal cancer Neg Hx    Rectal cancer Neg Hx    Stomach cancer Neg Hx     Social History   Socioeconomic History   Marital status: Single    Spouse name: Not on file   Number of children: Not on file   Years of education: Not on file   Highest education level: 11th grade  Occupational History   Occupation: Retired    Comment: Chief Executive Officer   Occupation: Retired    Comment: [employer]  Tobacco Use   Smoking status: Every Day    Packs/day: 0.50    Years: 25.00    Total pack years: 12.50    Types: Cigarettes   Smokeless tobacco: Never   Tobacco comments:    .5 ppd  Vaping Use   Vaping Use: Never used  Substance and Sexual Activity   Alcohol use: Yes    Alcohol/week: 0.0 standard drinks of alcohol    Comment: occasionally   Drug use: No    Comment: positive marijuana metabolite in 2010   Sexual activity: Not Currently    Birth control/protection: Other-see comments, Post-menopausal    Comment: s/p tubal ligation  Other Topics Concern   Not on file  Social History Narrative   Current Social History 09/05/2020       Patient lives alone in a ground floor apartment. There is one step down without handrails to the entrance the patient uses.       Patient's method of transportation is personal car.      The highest level of education was 11 th grade      The patient currently retired from [employer], and the [employer].      Identified important Relationships are "Aldona Bar and Mingo Amber"       Pets : [place] "[employer] / Fun: "Walking at the park"       Current Stressors: "I have no stress; I'm good"       Religious / Personal Beliefs: "Holiness"       L. [place], BSN, RN-BC    Social Determinants of Health   Financial Resource Strain: Not on file  Food Insecurity: Not on file  Transportation Needs: Not on file  Physical Activity: Not on file  Stress: Not on file  Social Connections: Not on file  Intimate Partner Violence: Not on file    Review of Systems:    Constitutional: No weight loss, fever or chills Skin: No rash  Cardiovascular: No chest pain Respiratory: No SOB  Gastrointestinal: See HPI and otherwise negative Genitourinary: No dysuria Neurological: No headache, dizziness or syncope Musculoskeletal: No new muscle or joint pain Hematologic: No bleeding  Psychiatric: No history of depression or anxiety   Physical Exam:  Vital signs: BP (!) 140/70   Pulse 86   Ht '5\' 6"'$  (1.676 m)   Wt 187 lb (84.8 kg)   LMP 08/27/2007   BMI 30.18 kg/m    Constitutional:   Pleasant AA female appears to be in NAD, Well developed, Well nourished, alert and cooperative Head:  Normocephalic and atraumatic. Eyes:   PEERL, EOMI. No icterus. Conjunctiva pink. Ears:  Normal auditory acuity. Neck:  Supple Throat: Oral cavity and pharynx without inflammation, swelling or lesion.  Respiratory: Respirations even and unlabored. Lungs clear to auscultation bilaterally.   No wheezes, crackles, or rhonchi.  Cardiovascular: Normal S1, S2. No MRG. Regular rate and rhythm. No peripheral  edema, cyanosis or pallor.  Gastrointestinal:  Soft, nondistended, nontender. No rebound or guarding. Normal bowel sounds. No appreciable masses or hepatomegaly. Rectal:  Not performed.  Msk:  Symmetrical without gross deformities. Without edema, no deformity or joint abnormality.  Neurologic:  Alert and  oriented x4;  grossly normal neurologically.  Skin:   Dry and intact without significant lesions or  rashes. Psychiatric: Demonstrates good judgement and reason without abnormal affect or behaviors.  RELEVANT LABS AND IMAGING: CBC    Component Value Date/Time   WBC 6.4 08/06/2020 1520   WBC 7.9 03/16/2014 1328   RBC 4.42 08/06/2020 1520   RBC 4.53 03/16/2014 1328   HGB 12.7 08/06/2020 1520   HCT 38.0 08/06/2020 1520   PLT 251 08/06/2020 1520   MCV 86 08/06/2020 1520   MCH 28.7 08/06/2020 1520   MCH 28.5 03/16/2014 1328   MCHC 33.4 08/06/2020 1520   MCHC 35.5 03/16/2014 1328   RDW 13.4 08/06/2020 1520   LYMPHSABS 2.8 08/06/2020 1520   MONOABS 0.6 03/16/2014 1328   EOSABS 0.1 08/06/2020 1520   BASOSABS 0.0 08/06/2020 1520    CMP     Component Value Date/Time   NA 139 03/04/2022 1558   K 4.3 03/04/2022 1558   CL 104 03/04/2022 1558   CO2 22 03/04/2022 1558   GLUCOSE 68 (L) 03/04/2022 1558   GLUCOSE 128 (H) 09/17/2020 0830   BUN 20 03/04/2022 1558   CREATININE 0.65 03/04/2022 1558   CREATININE 0.61 02/25/2013 1139   CALCIUM 9.6 03/04/2022 1558   PROT 7.4 08/06/2020 1520   ALBUMIN 4.3 08/06/2020 1520   AST 12 08/06/2020 1520   ALT 14 08/06/2020 1520   ALKPHOS 76 08/06/2020 1520   BILITOT <0.2 08/06/2020 1520   GFRNONAA >60 09/17/2020 0830   GFRNONAA >89 02/25/2013 1139   GFRAA 108 06/20/2020 1626   GFRAA >89 02/25/2013 1139    Assessment: 1.  Dysphagia: Increase in symptoms over the past year, also GERD; consider stricture versus dysmotility versus reflux 2.  History of adenomatous polyps: Due for repeat colonoscopy 12/20/2022 3.  GERD  Plan: 1.  Scheduled patient for barium esophagram with tablet for further evaluation.  Pending results will consider EGD. 2.  Discontinue Pepcid.  Started Omeprazole 40 mg twice daily, 30-60 minutes before breakfast and dinner #60 with 4 refills. 3.  Reviewed antidysphagia measures including taking small bites, drinking sips of water in between bites, chewing well, avoiding distraction while eating and the chin tuck technique. 4.   Patient to follow in clinic per recommendations after barium esophagram as above.  Ellouise Newer, PA-C [facility] 06/24/2022, 1:42 PM  Cc: Drucie Opitz, MD

## 2022-07-02 ENCOUNTER — Other Ambulatory Visit: Payer: Self-pay

## 2022-07-02 DIAGNOSIS — E1129 Type 2 diabetes mellitus with other diabetic kidney complication: Secondary | ICD-10-CM

## 2022-07-02 MED ORDER — TRESIBA FLEXTOUCH 100 UNIT/ML ~~LOC~~ SOPN: 25.0000 [IU] | PEN_INJECTOR | Freq: Every day | SUBCUTANEOUS | 3 refills | Status: DC

## 2022-07-02 MED ORDER — INSULIN LISPRO (1 UNIT DIAL) 100 UNIT/ML (KWIKPEN): PEN_INJECTOR | SUBCUTANEOUS | 1 refills | Status: DC

## 2022-07-04 ENCOUNTER — Other Ambulatory Visit (HOSPITAL_COMMUNITY): Payer: 59

## 2022-07-07 ENCOUNTER — Other Ambulatory Visit: Payer: Self-pay | Admitting: Internal Medicine

## 2022-07-07 ENCOUNTER — Encounter: Payer: 59 | Admitting: Student

## 2022-07-07 DIAGNOSIS — E1129 Type 2 diabetes mellitus with other diabetic kidney complication: Secondary | ICD-10-CM

## 2022-07-10 ENCOUNTER — Telehealth: Payer: Self-pay | Admitting: *Deleted

## 2022-07-10 NOTE — Telephone Encounter (Signed)
Call from [place] with [facility] visited patient today did a HbA1c 9.8.  Also did PVD Screening patient found to have elevation in her right leg.  No complaints of pain or swelling.  Just wanted t inform office.  Berton Lan was informed that patient has an appointment on 07/14/2022 in the Clinics.  Will inform patient and have her bring in her medications. Has a CGM  on as well.

## 2022-07-14 ENCOUNTER — Ambulatory Visit (INDEPENDENT_AMBULATORY_CARE_PROVIDER_SITE_OTHER): Payer: 59 | Admitting: Student

## 2022-07-14 ENCOUNTER — Encounter: Payer: Self-pay | Admitting: Student

## 2022-07-14 ENCOUNTER — Ambulatory Visit (INDEPENDENT_AMBULATORY_CARE_PROVIDER_SITE_OTHER): Payer: 59

## 2022-07-14 ENCOUNTER — Other Ambulatory Visit: Payer: Self-pay | Admitting: Internal Medicine

## 2022-07-14 ENCOUNTER — Other Ambulatory Visit: Payer: Self-pay

## 2022-07-14 VITALS — BP 136/73 | HR 90 | Temp 98.3°F | Resp 32 | Ht 66.0 in | Wt 185.5 lb

## 2022-07-14 VITALS — BP 136/73 | HR 90 | Temp 98.3°F | Ht 66.0 in | Wt 185.5 lb

## 2022-07-14 DIAGNOSIS — Z0000 Encounter for general adult medical examination without abnormal findings: Secondary | ICD-10-CM

## 2022-07-14 DIAGNOSIS — Z794 Long term (current) use of insulin: Secondary | ICD-10-CM

## 2022-07-14 DIAGNOSIS — G8929 Other chronic pain: Secondary | ICD-10-CM

## 2022-07-14 DIAGNOSIS — R809 Proteinuria, unspecified: Secondary | ICD-10-CM | POA: Diagnosis not present

## 2022-07-14 DIAGNOSIS — I10 Essential (primary) hypertension: Secondary | ICD-10-CM | POA: Diagnosis not present

## 2022-07-14 DIAGNOSIS — M792 Neuralgia and neuritis, unspecified: Secondary | ICD-10-CM

## 2022-07-14 DIAGNOSIS — F17210 Nicotine dependence, cigarettes, uncomplicated: Secondary | ICD-10-CM | POA: Diagnosis not present

## 2022-07-14 DIAGNOSIS — E1165 Type 2 diabetes mellitus with hyperglycemia: Secondary | ICD-10-CM | POA: Diagnosis not present

## 2022-07-14 DIAGNOSIS — J4520 Mild intermittent asthma, uncomplicated: Secondary | ICD-10-CM | POA: Diagnosis not present

## 2022-07-14 DIAGNOSIS — M25561 Pain in right knee: Secondary | ICD-10-CM

## 2022-07-14 DIAGNOSIS — F330 Major depressive disorder, recurrent, mild: Secondary | ICD-10-CM

## 2022-07-14 DIAGNOSIS — E1129 Type 2 diabetes mellitus with other diabetic kidney complication: Secondary | ICD-10-CM | POA: Diagnosis not present

## 2022-07-14 LAB — POCT GLYCOSYLATED HEMOGLOBIN (HGB A1C): Hemoglobin A1C: 8.9 % — AB (ref 4.0–5.6)

## 2022-07-14 LAB — GLUCOSE, CAPILLARY: Glucose-Capillary: 84 mg/dL (ref 70–99)

## 2022-07-14 MED ORDER — DULOXETINE HCL 30 MG PO CPEP: 60.0000 mg | ORAL_CAPSULE | Freq: Every day | ORAL | 3 refills | Status: DC

## 2022-07-14 MED ORDER — SYNJARDY 12.5-1000 MG PO TABS: 1.0000 | ORAL_TABLET | Freq: Two times a day (BID) | ORAL | 3 refills | Status: DC

## 2022-07-14 NOTE — Progress Notes (Signed)
Subjective:   Krista Sosa is a 72 y.o. female who presents for Medicare Annual (Subsequent) preventive examination. I connected with  Krista Sosa on 07/14/22 by a  Face-To-Face  enabled telemedicine application and verified that I am speaking with the correct person using two identifiers.  Patient Location: Other:  Office/Clinic  Provider Location: Office/Clinic  I discussed the limitations of evaluation and management by telemedicine. The patient expressed understanding and agreed to proceed.  Review of Systems    Defer to PCP       Objective:    Today's Vitals   07/14/22 1305  BP: 136/73  Pulse: 90  Temp: 98.3 F (36.8 C)  TempSrc: Oral  SpO2: 98%  Weight: 185 lb 8 oz (84.1 kg)  Height: 5' 6"$  (1.676 m)   Body mass index is 29.94 kg/m.     07/14/2022    1:08 PM 07/14/2022    9:36 AM 03/04/2022    2:48 PM 11/11/2021    2:45 PM 08/06/2021    3:06 PM 05/29/2021   10:17 AM 02/21/2021    1:47 PM  Advanced Directives  Does Patient Have a Medical Advance Directive? No No No No No No No  Would patient like information on creating a medical advance directive? No - Patient declined No - Patient declined No - Patient declined No - Patient declined No - Patient declined No - Patient declined No - Patient declined    Current Medications (verified) Outpatient Encounter Medications as of 07/14/2022  Medication Sig   albuterol (VENTOLIN HFA) 108 (90 Base) MCG/ACT inhaler Inhale 1-2 puffs into the lungs every 4 (four) hours as needed for wheezing or shortness of breath (coughing fits). Use with spacer.   atorvastatin (LIPITOR) 80 MG tablet Take 1 tablet (80 mg total) by mouth daily.   BD PEN NEEDLE NANO 2ND GEN 32G X 4 MM MISC USE TO INJECT INSULIN 3 TIMES DAILY   clotrimazole-betamethasone (LOTRISONE) cream Apply 1 application. topically 2 (two) times daily.   Continuous Blood Gluc Sensor (FREESTYLE LIBRE 2 SENSOR) MISC USE TO CHECK BLOOD GLUCOSE AT LEAST 6 TIMES A DAY    diclofenac Sodium (VOLTAREN) 1 % GEL Apply 4 g topically 4 (four) times daily.   DULoxetine (CYMBALTA) 30 MG capsule Take 2 capsules (60 mg total) by mouth daily.   Empagliflozin-metFORMIN HCl (SYNJARDY) 12.09-998 MG TABS Take 1 tablet by mouth 2 (two) times daily.   Fluticasone-Umeclidin-Vilant (TRELEGY ELLIPTA) 200-62.5-25 MCG/INH AEPB Inhale 1 puff into the lungs daily. Rinse mouth after each use.   HYDROcodone bit-homatropine (HYCODAN) 5-1.5 MG/5ML syrup Take 2.5 to 5 mL by mouth every 6 hours as needed for cough.   insulin degludec (TRESIBA FLEXTOUCH) 100 UNIT/ML FlexTouch Pen Inject 25 Units into the skin daily.   insulin lispro (HUMALOG KWIKPEN) 100 UNIT/ML KwikPen INJECT 12 UNITS IN THE MORNING AND 15 UNITS IN THE AFTERNOON WITH LARGEST MEALS   olmesartan-hydrochlorothiazide (BENICAR HCT) 20-12.5 MG tablet TAKE 1 TABLET BY MOUTH EVERY DAY   omeprazole (PRILOSEC) 40 MG capsule Take 1 capsule (40 mg total) by mouth 2 (two) times daily.   predniSONE (DELTASONE) 20 MG tablet Take 2 tablets (40 mg total) by mouth daily.   Skin Protectants, Misc. (EUCERIN) cream Apply topically as needed for dry skin.   triamcinolone (KENALOG) 0.025 % ointment APPLY TOPICALLY TWICE DAILY AS DIRECTED   [DISCONTINUED] Triamcinolone Acetonide (TRIAMCINOLONE 0.1 % CREAM : EUCERIN) CREA Apply 1 application topically 2 (two) times daily as needed.  No facility-administered encounter medications on file as of 07/14/2022.    Allergies (verified) Pineapple   History: Past Medical History:  Diagnosis Date   Allergy    Asthma, chronic 06/09/2006   Bilateral cataracts 05/07/2016   s/p left cataract extraction 10/02/2016 and right cataract extraction 10/16/2016   Bronchiectasis without complication ([place]) [id number]   Mild left lower lobe bronchiectasis seen on CT scan of the thorax in December 2019   Cataract    bil eyes   Essential hypertension 02/12/2018   Gastroesophageal reflux disease 08/13/2011   Glaucoma     Per patient report   Hx of adenomatous colonic polyps 11/16/2008   Hypertension    pt said she is supposed to take med but she is not taking it.   Hypertensive retinopathy, grade 2, bilateral 05/07/2016   Inflamed epidermoid cyst of skin 07/02/2020   Latent tuberculosis    dx 03/17/08 - tx with INH X 9 months   Lichenoid dermatitis 02/13/2012   Previously treated with Triamcinolone 0.025% cream Q8-12H PRN    Migraine headache 08/06/2018   Osteoarthritis, generalized 05/21/2006   Diffuse: cervical spine, right shoulder, left wrist Ineffective medications: Tylenol, ASA, Naprosyn, Ibuprofen, Meloxicam     Overweight (BMI 25.0-29.9) 02/13/2012   Perennial allergic rhinitis with seasonal variation 06/09/2006   Persistent microalbuminuria associated with type II diabetes mellitus ([place]) 05/21/2006   Radiculopathy affecting upper extremity 01/02/2019   Sickle cell anemia ([place])    pt has sickle cell trait per pt   Sickle cell trait ([place]) 02/13/2012   Smoker    1/2ppd   Split ear lobe 01/04/2019   Tobacco abuse disorder 06/09/2006   Tubular adenoma of rectum 11/16/2008   5 mm, excised endoscopically 11/16/2008, repeat colonoscopy due July 2015    Ulnar neuropathy of right upper extremity 02/14/2016   Urinary tract disease    JICP   Urticaria    Past Surgical History:  Procedure Laterality Date   APPENDECTOMY     CATARACT EXTRACTION, BILATERAL Bilateral    OS 10/10/2016, OD 10/16/2016   COLONOSCOPY  11/16/08   EXCISION OF KELOID N/A 09/19/2020   Procedure: EXCISION OF CHRONICALLY INFECTED UPPER BACK CYST;  Surgeon: Coralie Keens, MD;  Location: [facility];  Service: General;  Laterality: N/A;   FOOT SURGERY  07/31/2008   Exostectomy right hallus   TONSILLECTOMY     TUBAL LIGATION     Family History  Problem Relation Age of Onset   Diabetes Mother    Heart disease Mother 62   Colon cancer Mother    Cancer Mother        Colon   Cancer Father        Lung   Sickle cell anemia  Sister    Sickle cell anemia Brother    Diabetes Sister    Brain cancer Sister    Cancer Sister        brain   Lung cancer Brother    Cancer Brother        Lung   Healthy Sister    Healthy Sister    Healthy Sister    Healthy Daughter    Healthy Son    Healthy Daughter    Healthy Daughter    Healthy Son    Wilson's disease Maternal Uncle    Breast cancer Maternal Aunt    Breast cancer Other    Esophageal cancer Neg Hx    Rectal cancer Neg Hx    Stomach cancer Neg Hx  Social History   Socioeconomic History   Marital status: Single    Spouse name: Not on file   Number of children: Not on file   Years of education: Not on file   Highest education level: 11th grade  Occupational History   Occupation: Retired    Comment: Chief Executive Officer   Occupation: Retired    Comment: [employer]  Tobacco Use   Smoking status: Every Day    Packs/day: 0.30    Years: 25.00    Total pack years: 7.50    Types: Cigarettes   Smokeless tobacco: Never   Tobacco comments:    .3 ppd  Vaping Use   Vaping Use: Never used  Substance and Sexual Activity   Alcohol use: Yes    Alcohol/week: 0.0 standard drinks of alcohol    Comment: occasionally   Drug use: No    Comment: positive marijuana metabolite in 2010   Sexual activity: Not Currently    Birth control/protection: Other-see comments, Post-menopausal    Comment: s/p tubal ligation  Other Topics Concern   Not on file  Social History Narrative   Current Social History 09/05/2020       Patient lives alone in a ground floor apartment. There is one step down without handrails to the entrance the patient uses.       Patient's method of transportation is personal car.      The highest level of education was 11 th grade      The patient currently retired from [employer], and the [employer].      Identified important Relationships are "Aldona Bar and Mingo Amber"       Pets : [place] "[employer] / Fun: "Walking at the  park"       Current Stressors: "I have no stress; I'm Sosa"       Religious / Personal Beliefs: "Holiness"       L. Ducatte, BSN, RN-BC    Social Determinants of Health   Financial Resource Strain: High Risk (07/14/2022)   Overall Financial Resource Strain (CARDIA)    Difficulty of Paying Living Expenses: Very hard  Food Insecurity: Unknown (07/14/2022)   Hunger Vital Sign    Worried About Running Out of Food in the Last Year: Patient refused    [place] in the Last Year: Patient refused  Transportation Needs: No Transportation Needs (07/14/2022)   PRAPARE - Hydrologist (Medical): No    Lack of Transportation (Non-Medical): No  Physical Activity: Unknown (07/14/2022)   Exercise Vital Sign    Days of Exercise per Week: Not on file    Minutes of Exercise per Session: 30 min  Stress: Stress Concern Present (07/14/2022)   [place]    Feeling of Stress : To some extent  Social Connections: Unknown (07/14/2022)   Social Connection and Isolation Panel [NHANES]    Frequency of Communication with Friends and Family: More than three times a week    Frequency of Social Gatherings with Friends and Family: Patient refused    Attends Religious Services: Patient refused    Marine scientist or Organizations: No    Attends Archivist Meetings: Never    Marital Status: Widowed    Tobacco Counseling Ready to quit: Not Answered Counseling given: Not Answered Tobacco comments: .3 ppd   Clinical Intake:  Pre-visit preparation completed: Yes  Pain :  No/denies pain     Nutritional Risks: None Diabetes: Yes CBG done?: Yes CBG resulted in Enter/ Edit results?: Yes Did pt. bring in CBG monitor from home?: Yes Glucose Meter Downloaded?: Yes  How often do you need to have someone help you when you read instructions, pamphlets, or other written materials from your doctor or  pharmacy?: 1 - Never What is the last grade level you completed in school?: 11th grade  Diabetic?Nutrition Risk Assessment:  Has the patient had any N/V/D within the last 2 months?  No  Does the patient have any non-healing wounds?  No  Has the patient had any unintentional weight loss or weight gain?  No   Diabetes:  Is the patient diabetic?  Yes  If diabetic, was a CBG obtained today?  Yes  Did the patient bring in their glucometer from home?  No  How often do you monitor your CBG's? Every 3 months.   Financial Strains and Diabetes Management:  Are you having any financial strains with the device, your supplies or your medication? No .  Does the patient want to be seen by Chronic Care Management for management of their diabetes?  No  Would the patient like to be referred to a Nutritionist or for Diabetic Management?  No   Diabetic Exams:  Diabetic Eye Exam: Overdue for diabetic eye exam. Pt has been advised about the importance in completing this exam. Patient advised to call and schedule an eye exam. Diabetic Foot Exam: Completed 12/13/2020    Interpreter Needed?: No  Information entered by :: Garth Diffley,cma 07/14/22 1:08pm   Activities of Daily Living    07/14/2022    1:08 PM 07/14/2022    9:35 AM  In your present state of health, do you have any difficulty performing the following activities:  Hearing? 0 0  Vision? 0 0  Difficulty concentrating or making decisions? 0 0  Walking or climbing stairs? 1 1  Comment  tires , weak knees  Dressing or bathing? 0 0  Doing errands, shopping? 0 0    Patient Care Team: Drucie Opitz, MD as PCP - General Marilynne Halsted, MD as Referring Physician (Ophthalmology) Gatha Mayer, MD as Consulting Physician (Gastroenterology) Coralie Keens, MD as Consulting Physician (General Surgery) Althea Charon, [place] as Nurse Practitioner (Family Medicine) Velora Heckler (Inactive) (Orthotics) Plyler, Chauncey Reading, RD as Dietitian  (Dietician) Garnet Sierras, DO as Consulting Physician (Allergy)  Indicate any recent Medical Services you may have received from other than [facility] providers in the past year (date may be approximate).     Assessment:   This is a routine wellness examination for Rebbecca.  Hearing/Vision screen No results found.  Dietary issues and exercise activities discussed:     Goals Addressed   None   Depression Screen    07/14/2022    1:08 PM 03/04/2022    2:48 PM 11/11/2021    2:46 PM 08/06/2021    3:05 PM 07/10/2021   11:25 AM 05/29/2021   10:17 AM 02/21/2021    1:47 PM  PHQ 2/9 Scores  PHQ - 2 Score 0 0 0 0 0 1 0  PHQ- 9 Score  7  0 2 4 0    Fall Risk    07/14/2022    1:08 PM 07/14/2022    9:35 AM 03/04/2022    2:48 PM 11/11/2021    2:45 PM 08/06/2021    3:05 PM  Fall Risk   Falls in the past year? 0 0  0 0 0  Number falls in past yr: 0 0  0 0  Injury with Fall? 0 0  0 0  Risk for fall due to : No Fall Risks No Fall Risks No Fall Risks  No Fall Risks  Follow up Falls evaluation completed;Falls prevention discussed Falls evaluation completed;Falls prevention discussed Falls evaluation completed Falls evaluation completed Falls evaluation completed;Falls prevention discussed    FALL RISK PREVENTION PERTAINING TO THE HOME:  Any stairs in or around the home? Yes  If so, are there any without handrails? Yes  Home free of loose throw rugs in walkways, pet beds, electrical cords, etc? No  Adequate lighting in your home to reduce risk of falls? Yes   ASSISTIVE DEVICES UTILIZED TO PREVENT FALLS:  Life alert? No  Use of a cane, walker or w/c? No  Grab bars in the bathroom? No  Shower chair or bench in shower? No  Elevated toilet seat or a handicapped toilet? No   TIMED UP AND GO:  Was the test performed? Yes .  Length of time to ambulate 10 feet: 1 min.  Gait slow and steady without use of assistive device Gait slow and steady without use of assistive device  Cognitive Function:         07/14/2022    1:08 PM  6CIT Screen  What Year? 0 points  What month? 0 points  What time? 0 points  Count back from 20 0 points  Months in reverse 0 points  Repeat phrase 0 points  Total Score 0 points    Immunizations Immunization History  Administered Date(s) Administered   Influenza Split 04/07/2011, 02/13/2012   Influenza Whole 05/04/2007, 03/14/2008, 03/06/2009   Influenza,inj,Quad PF,6+ Mos 01/20/2014, 02/14/2016, 01/29/2017, 02/12/2018, 02/21/2019, 03/26/2020, 03/04/2022   Influenza-Unspecified 03/26/2013, 02/21/2021   PFIZER(Purple Top)SARS-COV-2 Vaccination 07/29/2019, 08/19/2019, 07/17/2020   PNEUMOCOCCAL CONJUGATE-20 11/11/2021   PPD Test 07/25/2014   Pneumococcal Conjugate-13 07/28/2014   Pneumococcal Polysaccharide-23 03/06/2009, 10/19/2015   Td 03/04/2022    TDAP status: Up to date  Flu Vaccine status: Up to date  Pneumococcal vaccine status: Up to date  Covid-19 vaccine status: Completed vaccines  Qualifies for Shingles Vaccine? No   Zostavax completed No   Shingrix Completed?: No.    Education has been provided regarding the importance of this vaccine. Patient has been advised to call insurance company to determine out of pocket expense if they have not yet received this vaccine. Advised may also receive vaccine at local pharmacy or Health Dept. Verbalized acceptance and understanding.  Screening Tests Health Maintenance  Topic Date Due   Zoster Vaccines- Shingrix (1 of 2) Never done   COVID-19 Vaccine (4 - 2023-24 season) 01/24/2022   OPHTHALMOLOGY EXAM  04/24/2022   Diabetic kidney evaluation - Urine ACR  03/06/2027 (Originally 09/29/2019)   HEMOGLOBIN A1C  10/12/2022   COLONOSCOPY (Pts 45-6yr Insurance coverage will need to be confirmed)  12/20/2022   Diabetic kidney evaluation - eGFR measurement  03/05/2023   FOOT EXAM  03/05/2023   LIPID PANEL  03/05/2023   Medicare Annual Wellness (AWV)  07/15/2023   MAMMOGRAM  05/22/2024   DTaP/Tdap/Td  (2 - Tdap) 03/04/2032   Pneumonia Vaccine 70 Years old  Completed   INFLUENZA VACCINE  Completed   DEXA SCAN  Completed   Hepatitis C Screening  Completed   HPV VACCINES  Aged Out    Health Maintenance  Health Maintenance Due  Topic Date Due   Zoster Vaccines- Shingrix (1 of 2) Never  done   COVID-19 Vaccine (4 - 2023-24 season) 01/24/2022   OPHTHALMOLOGY EXAM  04/24/2022    Colorectal cancer screening: Type of screening: Colonoscopy. Completed 12/20/2015. Repeat every 7 years  Mammogram status: Completed 05/22/2022. Repeat every year:2 Bone Density status: Ordered 07/14/2022. Pt provided with contact info and advised to call to schedule appt.  Lung Cancer Screening: (Low Dose CT Chest recommended if Age 15-80 years, 30 pack-year currently smoking OR have quit w/in 15years.) does not qualify.   Lung Cancer Screening Referral: N/A  Additional Screening:  Hepatitis C Screening: does not qualify; Completed 03/29/2008  Vision Screening: Recommended annual ophthalmology exams for early detection of glaucoma and other disorders of the eye. Is the patient up to date with their annual eye exam?  Yes  Who is the provider or what is the name of the office in which the patient attends annual eye exams? Marilynne Halsted,  If pt is not established with a provider, would they like to be referred to a provider to establish care? No .   Dental Screening: Recommended annual dental exams for proper oral hygiene  Community Resource Referral / Chronic Care Management: CRR required this visit?  No   CCM required this visit?  No      Plan:     I have personally reviewed and noted the following in the patient's chart:   Medical and social history Use of alcohol, tobacco or illicit drugs  Current medications and supplements including opioid prescriptions. Patient is not currently taking opioid prescriptions. Functional ability and status Nutritional status Physical activity Advanced  directives List of other physicians Hospitalizations, surgeries, and ER visits in previous 12 months Vitals Screenings to include cognitive, depression, and falls Referrals and appointments  In addition, I have reviewed and discussed with patient certain preventive protocols, quality metrics, and best practice recommendations. A written personalized care plan for preventive services as well as general preventive health recommendations were provided to patient.     Kerin Perna, [facility]   07/14/2022   Nurse Notes: Face-To-Face Visit  Ms. Lindler , Thank you for taking time to come for your Medicare Wellness Visit. I appreciate your ongoing commitment to your health goals. Please review the following plan we discussed and let me know if I can assist you in the future.   These are the goals we discussed:  Goals      Blood Pressure < 140/90     Exercise 3x per week (30 min per time)     Walking     HEMOGLOBIN A1C < 7.0     LDL CALC < 100     Quit smoking / using tobacco     Weight < 150 lb (68 kg)        This is a list of the screening recommended for you and due dates:  Health Maintenance  Topic Date Due   Zoster (Shingles) Vaccine (1 of 2) Never done   COVID-19 Vaccine (4 - 2023-24 season) 01/24/2022   Eye exam for diabetics  04/24/2022   Yearly kidney health urinalysis for diabetes  03/06/2027*   Hemoglobin A1C  10/12/2022   Colon Cancer Screening  12/20/2022   Yearly kidney function blood test for diabetes  03/05/2023   Complete foot exam   03/05/2023   Lipid (cholesterol) test  03/05/2023   Medicare Annual Wellness Visit  07/15/2023   Mammogram  05/22/2024   DTaP/Tdap/Td vaccine (2 - Tdap) 03/04/2032   Pneumonia Vaccine  Completed   Flu Shot  Completed   DEXA scan (bone density measurement)  Completed   Hepatitis C Screening: USPSTF Recommendation to screen - Ages 18-79 yo.  Completed   HPV Vaccine  Aged Out  *Topic was postponed. The date shown is not the original  due date.

## 2022-07-14 NOTE — Patient Instructions (Addendum)
Thank you so much for coming to the clinic today!   Please stop taking the Jardiance and the Janumet, and start taking the new medication that I prescribed for you.  I have also sent a refill of the duloxetine (Cymbalta).  At your next visit, please bring in the glucose monitor has it will be very important for [place] to see if we need to make changes to your insulin.  I would also recommend bringing in all of your medications as well so we can go through them together.  If you have any questions please feel free to the call the clinic at anytime at [phone]. It was a pleasure seeing you!  Best, Dr. Sanjuana Mae

## 2022-07-15 ENCOUNTER — Ambulatory Visit (HOSPITAL_COMMUNITY)
Admission: RE | Admit: 2022-07-15 | Discharge: 2022-07-15 | Disposition: A | Discharge status: Home / Self Care | Payer: 59 | Source: Ambulatory Visit | Attending: Physician Assistant | Admitting: Physician Assistant

## 2022-07-15 DIAGNOSIS — R1310 Dysphagia, unspecified: Secondary | ICD-10-CM | POA: Insufficient documentation

## 2022-07-15 MED ORDER — TRESIBA FLEXTOUCH 100 UNIT/ML ~~LOC~~ SOPN: 28.0000 [IU] | PEN_INJECTOR | Freq: Every day | SUBCUTANEOUS | 3 refills | Status: DC

## 2022-07-15 NOTE — Progress Notes (Signed)
CC: General Check Up  HPI:  [name] is a 72 y.o. female living with a history stated below and presents today for general check up. Please see problem based assessment and plan for additional details.  Past Medical History:  Diagnosis Date   Allergy    Asthma, chronic 06/09/2006   Bilateral cataracts 05/07/2016   s/p left cataract extraction 10/02/2016 and right cataract extraction 10/16/2016   Bronchiectasis without complication ([place]) [id number]   Mild left lower lobe bronchiectasis seen on CT scan of the thorax in December 2019   Cataract    bil eyes   Essential hypertension 02/12/2018   Gastroesophageal reflux disease 08/13/2011   Glaucoma    Per patient report   Hx of adenomatous colonic polyps 11/16/2008   Hypertension    pt said she is supposed to take med but she is not taking it.   Hypertensive retinopathy, grade 2, bilateral 05/07/2016   Inflamed epidermoid cyst of skin 07/02/2020   Latent tuberculosis    dx 03/17/08 - tx with INH X 9 months   Lichenoid dermatitis 02/13/2012   Previously treated with Triamcinolone 0.025% cream Q8-12H PRN    Migraine headache 08/06/2018   Osteoarthritis, generalized 05/21/2006   Diffuse: cervical spine, right shoulder, left wrist Ineffective medications: Tylenol, ASA, Naprosyn, Ibuprofen, Meloxicam     Overweight (BMI 25.0-29.9) 02/13/2012   Perennial allergic rhinitis with seasonal variation 06/09/2006   Persistent microalbuminuria associated with type II diabetes mellitus ([place]) 05/21/2006   Radiculopathy affecting upper extremity 01/02/2019   Sickle cell anemia ([place])    pt has sickle cell trait per pt   Sickle cell trait ([place]) 02/13/2012   Smoker    1/2ppd   Split ear lobe 01/04/2019   Tobacco abuse disorder 06/09/2006   Tubular adenoma of rectum 11/16/2008   5 mm, excised endoscopically 11/16/2008, repeat colonoscopy due July 2015    Ulnar neuropathy of right upper extremity 02/14/2016   Urinary tract disease    JICP    Urticaria     Current Outpatient Medications on File Prior to Visit  Medication Sig Dispense Refill   albuterol (VENTOLIN HFA) 108 (90 Base) MCG/ACT inhaler Inhale 1-2 puffs into the lungs every 4 (four) hours as needed for wheezing or shortness of breath (coughing fits). Use with spacer. 1 each 1   atorvastatin (LIPITOR) 80 MG tablet Take 1 tablet (80 mg total) by mouth daily. 30 tablet 11   BD PEN NEEDLE NANO 2ND GEN 32G X 4 MM MISC USE TO INJECT INSULIN 3 TIMES DAILY 300 each 3   clotrimazole-betamethasone (LOTRISONE) cream Apply 1 application. topically 2 (two) times daily. 30 g 0   Continuous Blood Gluc Sensor (FREESTYLE LIBRE 2 SENSOR) MISC USE TO CHECK BLOOD GLUCOSE AT LEAST 6 TIMES A DAY 1 each 3   diclofenac Sodium (VOLTAREN) 1 % GEL Apply 4 g topically 4 (four) times daily. 150 g 2   Fluticasone-Umeclidin-Vilant (TRELEGY ELLIPTA) 200-62.5-25 MCG/INH AEPB Inhale 1 puff into the lungs daily. Rinse mouth after each use. 60 each 3   HYDROcodone bit-homatropine (HYCODAN) 5-1.5 MG/5ML syrup Take 2.5 to 5 mL by mouth every 6 hours as needed for cough. 90 mL 0   insulin lispro (HUMALOG KWIKPEN) 100 UNIT/ML KwikPen INJECT 12 UNITS IN THE MORNING AND 15 UNITS IN THE AFTERNOON WITH LARGEST MEALS 45 mL 1   olmesartan-hydrochlorothiazide (BENICAR HCT) 20-12.5 MG tablet TAKE 1 TABLET BY MOUTH EVERY DAY 90 tablet 0   omeprazole (PRILOSEC) 40 MG  capsule Take 1 capsule (40 mg total) by mouth 2 (two) times daily. 60 capsule 3   predniSONE (DELTASONE) 20 MG tablet Take 2 tablets (40 mg total) by mouth daily. 10 tablet 0   Skin Protectants, Misc. (EUCERIN) cream Apply topically as needed for dry skin. 454 g 1   triamcinolone (KENALOG) 0.025 % ointment APPLY TOPICALLY TWICE DAILY AS DIRECTED 30 g 3   [DISCONTINUED] Triamcinolone Acetonide (TRIAMCINOLONE 0.1 % CREAM : EUCERIN) CREA Apply 1 application topically 2 (two) times daily as needed. 454 each 3   No current facility-administered medications on file  prior to visit.    Family History  Problem Relation Age of Onset   Diabetes Mother    Heart disease Mother 28   Colon cancer Mother    Cancer Mother        Colon   Cancer Father        Lung   Sickle cell anemia Sister    Sickle cell anemia Brother    Diabetes Sister    Brain cancer Sister    Cancer Sister        brain   Lung cancer Brother    Cancer Brother        Lung   Healthy Sister    Healthy Sister    Healthy Sister    Healthy Daughter    Healthy Son    Healthy Daughter    Healthy Daughter    Healthy Son    Wilson's disease Maternal Uncle    Breast cancer Maternal Aunt    Breast cancer Other    Esophageal cancer Neg Hx    Rectal cancer Neg Hx    Stomach cancer Neg Hx     Social History   Socioeconomic History   Marital status: Single    Spouse name: Not on file   Number of children: Not on file   Years of education: Not on file   Highest education level: 11th grade  Occupational History   Occupation: Retired    Comment: Chief Executive Officer   Occupation: Retired    Comment: [employer]  Tobacco Use   Smoking status: Every Day    Packs/day: 0.30    Years: 25.00    Total pack years: 7.50    Types: Cigarettes   Smokeless tobacco: Never   Tobacco comments:    .3 ppd  Vaping Use   Vaping Use: Never used  Substance and Sexual Activity   Alcohol use: Yes    Alcohol/week: 0.0 standard drinks of alcohol    Comment: occasionally   Drug use: No    Comment: positive marijuana metabolite in 2010   Sexual activity: Not Currently    Birth control/protection: Other-see comments, Post-menopausal    Comment: s/p tubal ligation  Other Topics Concern   Not on file  Social History Narrative   Current Social History 09/05/2020       Patient lives alone in a ground floor apartment. There is one step down without handrails to the entrance the patient uses.       Patient's method of transportation is personal car.      The highest level of education was 11 th grade       The patient currently retired from [employer], and the [employer].      Identified important Relationships are "Aldona Bar and Mingo Amber"       Pets : [place] "[employer] / Fun: "Walking at the park"  Current Stressors: "I have no stress; I'm good"       Religious / Personal Beliefs: "Holiness"       L. [place], BSN, RN-BC    Social Determinants of Health   Financial Resource Strain: High Risk (07/14/2022)   Overall Financial Resource Strain (CARDIA)    Difficulty of Paying Living Expenses: Very hard  Food Insecurity: Unknown (07/14/2022)   Hunger Vital Sign    Worried About Running Out of Food in the Last Year: Patient refused    [place] in the Last Year: Patient refused  Transportation Needs: No Transportation Needs (07/14/2022)   PRAPARE - Hydrologist (Medical): No    Lack of Transportation (Non-Medical): No  Physical Activity: Unknown (07/14/2022)   Exercise Vital Sign    Days of Exercise per Week: Not on file    Minutes of Exercise per Session: 30 min  Stress: Stress Concern Present (07/14/2022)   [place]    Feeling of Stress : To some extent  Social Connections: Unknown (07/14/2022)   Social Connection and Isolation Panel [NHANES]    Frequency of Communication with Friends and Family: More than three times a week    Frequency of Social Gatherings with Friends and Family: Patient refused    Attends Religious Services: Patient refused    Marine scientist or Organizations: No    Attends Archivist Meetings: Never    Marital Status: Widowed  Intimate Partner Violence: Not At Risk (07/14/2022)   Humiliation, Afraid, Rape, and Kick questionnaire    Fear of Current or Ex-Partner: No    Emotionally Abused: No    Physically Abused: No    Sexually Abused: No    Review of Systems: ROS negative except for what is noted on the  assessment and plan.  Vitals:   07/14/22 0937  BP: 136/73  Pulse: 90  Resp: (!) 32  Temp: 98.3 F (36.8 C)  TempSrc: Oral  SpO2: 98%  Weight: 185 lb 8 oz (84.1 kg)  Height: 5' 6"$  (1.676 m)    Physical Exam: Constitutional: well-appearing female in no acute distress HENT: normocephalic atraumatic, mucous membranes moist Eyes: conjunctiva non-erythematous Neck: supple Cardiovascular: regular rate and rhythm, no m/r/g Pulmonary/Chest: normal work of breathing on room air, lungs clear to auscultation bilaterally Abdominal: soft, non-tender, non-distended MSK: normal bulk and tone Neurological: alert & oriented x 3, 5/5 strength in bilateral upper and lower extremities, normal gait Skin: warm and dry   Assessment & Plan:   Uncontrolled type 2 diabetes mellitus with hyperglycemia, with long-term current use of insulin ([place]) Patient presents for a checkup.  A1c was taken by her home health nurse and was shown to be 9.8 at that time. Last A1c checked was 7.7. Repeat A1c in the clinic was 8.9. Her current regimen is 25U [place] daily, and 12U of Humalog in the AM, and 15U in the PM, which she states that she is compliant with. She also takes Sitagliptin-Metformin 50-1064m BID. She lives at home by herself, and states she is accurate with her medications, however does not remember her medication names and per review seems as if some of her medications she is out of, including the sitagliptin-metformin. She did not bring her glucometer to this visit. She does endorse polyuria and polydipsia.   Plan:  - With an effort to reduce pill burden, will start her on Synjardy 12.5- 10054mBID and stop  the Janumet and Jardiance  - Will also increase long acting to 28U - Encouraged patient to bring glucometer to next visit  - Will follow up in three months for repeat evaluation   Essential hypertension Pt is currently taking Olmesartan-HCTZ 20-12.57m, and endorses good compliance with it. Blood  pressure at 136/73 currently. Will continue. Will refill Olmesartan-HCTZ  Major depressive disorder, recurrent episode, mild with anxious distress ([place] Pt compliant with Cymbalta, will reorder.   Mild intermittent asthma Refilled inhalers  Healthcare maintenance Pt is over due for DEXA scan, will place order for one. She also has opthalmology appointment next week in the setting of diabetes.   Patient discussed with Dr. EThomasene Ripple M.D. CTrentInternal Medicine, PGY-1 Phone: [phone] 07/15/2022 Time 8:04 AM

## 2022-07-15 NOTE — Assessment & Plan Note (Addendum)
Patient presents for a checkup.  A1c was taken by her home health nurse and was shown to be 9.8 at that time. Last A1c checked was 7.7. Repeat A1c in the clinic was 8.9. Her current regimen is 25U [place] daily, and 12U of Humalog in the AM, and 15U in the PM, which she states that she is compliant with. She also takes Sitagliptin-Metformin 50-1046m BID. She lives at home by herself, and states she is accurate with her medications, however does not remember her medication names and per review seems as if some of her medications she is out of, including the sitagliptin-metformin. She did not bring her glucometer to this visit. She does endorse polyuria and polydipsia.   Plan:  - With an effort to reduce pill burden, will start her on Synjardy 12.5- 10061mBID and stop the Janumet and Jardiance  - Will also increase long acting to 28U - Encouraged patient to bring glucometer to next visit  - Will follow up in three months for repeat evaluation

## 2022-07-15 NOTE — Assessment & Plan Note (Signed)
Pt is over due for DEXA scan, will place order for one. She also has opthalmology appointment next week in the setting of diabetes.

## 2022-07-15 NOTE — Assessment & Plan Note (Signed)
Refilled inhalers

## 2022-07-15 NOTE — Assessment & Plan Note (Signed)
Pt compliant with Cymbalta, will reorder.

## 2022-07-15 NOTE — Assessment & Plan Note (Addendum)
Pt is currently taking Olmesartan-HCTZ 20-12.86m, and endorses good compliance with it. Blood pressure at 136/73 currently. Will continue. Will refill Olmesartan-HCTZ

## 2022-07-16 NOTE — Progress Notes (Signed)
Internal Medicine Clinic Attending  Case discussed with the resident at the time of the visit.  We reviewed the resident's history and exam and pertinent patient test results.  I agree with the assessment, diagnosis, and plan of care documented in the resident's note.  

## 2022-07-17 ENCOUNTER — Other Ambulatory Visit: Payer: Self-pay

## 2022-07-17 DIAGNOSIS — R1310 Dysphagia, unspecified: Secondary | ICD-10-CM

## 2022-07-18 NOTE — Progress Notes (Signed)
Internal Medicine Clinic Attending  Case and documentation of Dr. Sanjuana Mae   reviewed.  I reviewed the AWV findings.  I agree with the assessment, diagnosis, and plan of care documented in the AWV note.

## 2022-07-31 ENCOUNTER — Telehealth: Payer: Self-pay | Admitting: *Deleted

## 2022-07-31 ENCOUNTER — Other Ambulatory Visit: Payer: Self-pay | Admitting: Student

## 2022-07-31 DIAGNOSIS — E1129 Type 2 diabetes mellitus with other diabetic kidney complication: Secondary | ICD-10-CM

## 2022-07-31 NOTE — Telephone Encounter (Signed)
Continuous Blood Gluc Sensor (FREESTYLE LIBRE 2 SENSOR) MISC   [id number] [place] [address]

## 2022-07-31 NOTE — Telephone Encounter (Signed)
The prescription soul be corrected to dispense 2 with 11 refills or dispense 6 with 3 refills for her to get enough to monitor her glucose continuously.

## 2022-07-31 NOTE — Telephone Encounter (Signed)
Call from pt stating she needs more sensors. Stated 1 sensor last almost one month. Rx written 06/25/22 x 3 refills. I asked pt to call [facility] and if she has any problems, to call the office back.

## 2022-08-01 MED ORDER — FREESTYLE LIBRE 2 SENSOR MISC: 3 refills | Status: DC

## 2022-08-06 ENCOUNTER — Encounter: Payer: Self-pay | Admitting: Internal Medicine

## 2022-08-06 ENCOUNTER — Ambulatory Visit (AMBULATORY_SURGERY_CENTER): Payer: 59 | Admitting: Internal Medicine

## 2022-08-06 VITALS — BP 135/75 | HR 84 | Temp 97.8°F | Resp 13 | Ht 66.0 in | Wt 187.0 lb

## 2022-08-06 DIAGNOSIS — R1310 Dysphagia, unspecified: Secondary | ICD-10-CM

## 2022-08-06 DIAGNOSIS — K2950 Unspecified chronic gastritis without bleeding: Secondary | ICD-10-CM | POA: Diagnosis not present

## 2022-08-06 MED ORDER — SODIUM CHLORIDE 0.9 % IV SOLN: 500.0000 mL | Freq: Once | INTRAVENOUS | Status: DC

## 2022-08-06 NOTE — Progress Notes (Signed)
Uneventful anesthetic. Report to pacu rn. Vss. Care resumed by rn. 

## 2022-08-06 NOTE — Progress Notes (Signed)
[name] History and Physical   Primary Care Physician:  Drucie Opitz, MD   Reason for Procedure:   dysphagia  Plan:    EGD and possible esophageal dilation     HPI: Krista Sosa is a 72 y.o. female w/ hx of dysphagia to solids. Ba swallow was unrevealing.  Wt Readings from Last 3 Encounters:  08/06/22 187 lb (84.8 kg)  07/14/22 185 lb 8 oz (84.1 kg)  07/14/22 185 lb 8 oz (84.1 kg)      Past Medical History:  Diagnosis Date   Allergy    Asthma, chronic 06/09/2006   Bilateral cataracts 05/07/2016   s/p left cataract extraction 10/02/2016 and right cataract extraction 10/16/2016   Bronchiectasis without complication ([place]) [id number]   Mild left lower lobe bronchiectasis seen on CT scan of the thorax in December 2019   Cataract    bil eyes   Essential hypertension 02/12/2018   Gastroesophageal reflux disease 08/13/2011   Glaucoma    Per patient report   Hx of adenomatous colonic polyps 11/16/2008   Hypertension    pt said she is supposed to take med but she is not taking it.   Hypertensive retinopathy, grade 2, bilateral 05/07/2016   Inflamed epidermoid cyst of skin 07/02/2020   Latent tuberculosis    dx 03/17/08 - tx with INH X 9 months   Lichenoid dermatitis 02/13/2012   Previously treated with Triamcinolone 0.025% cream Q8-12H PRN    Migraine headache 08/06/2018   Osteoarthritis, generalized 05/21/2006   Diffuse: cervical spine, right shoulder, left wrist Ineffective medications: Tylenol, ASA, Naprosyn, Ibuprofen, Meloxicam     Overweight (BMI 25.0-29.9) 02/13/2012   Perennial allergic rhinitis with seasonal variation 06/09/2006   Persistent microalbuminuria associated with type II diabetes mellitus ([place]) 05/21/2006   Radiculopathy affecting upper extremity 01/02/2019   Sickle cell anemia ([place])    pt has sickle cell trait per pt   Sickle cell trait ([place]) 02/13/2012   Smoker    1/2ppd   Split ear lobe 01/04/2019   Tobacco abuse disorder 06/09/2006    Tubular adenoma of rectum 11/16/2008   5 mm, excised endoscopically 11/16/2008, repeat colonoscopy due July 2015    Ulnar neuropathy of right upper extremity 02/14/2016   Urinary tract disease    JICP   Urticaria     Past Surgical History:  Procedure Laterality Date   APPENDECTOMY     CATARACT EXTRACTION, BILATERAL Bilateral    OS 10/10/2016, OD 10/16/2016   COLONOSCOPY  11/16/08   EXCISION OF KELOID N/A 09/19/2020   Procedure: EXCISION OF CHRONICALLY INFECTED UPPER BACK CYST;  Surgeon: Coralie Keens, MD;  Location: [facility];  Service: General;  Laterality: N/A;   FOOT SURGERY  07/31/2008   Exostectomy right hallus   TONSILLECTOMY     TUBAL LIGATION      Prior to Admission medications   Medication Sig Start Date End Date Taking? Authorizing Provider  albuterol (VENTOLIN HFA) 108 (90 Base) MCG/ACT inhaler Inhale 1-2 puffs into the lungs every 4 (four) hours as needed for wheezing or shortness of breath (coughing fits). Use with spacer. 02/06/21  Yes Garnet Sierras, DO  atorvastatin (LIPITOR) 80 MG tablet Take 1 tablet (80 mg total) by mouth daily. 03/06/22 03/06/23 Yes Dean, Raquel Sarna, DO  BD PEN NEEDLE NANO 2ND GEN 32G X 4 MM MISC USE TO INJECT INSULIN 3 TIMES DAILY 07/08/22  Yes Nooruddin, Marlene Lard, MD  clotrimazole-betamethasone (LOTRISONE) cream Apply 1 application. topically 2 (two) times daily.  10/30/21  Yes Regal, Tamala Fothergill, DPM  Continuous Blood Gluc Sensor (FREESTYLE LIBRE 2 SENSOR) MISC Please use to monitor glucose 08/01/22  Yes Nooruddin, Marlene Lard, MD  diclofenac Sodium (VOLTAREN) 1 % GEL Apply 4 g topically 4 (four) times daily. 07/10/21  Yes Orvis Brill, MD  Empagliflozin-metFORMIN HCl (SYNJARDY) 12.09-998 MG TABS Take 1 tablet by mouth 2 (two) times daily. 07/14/22  Yes Nooruddin, Marlene Lard, MD  Fluticasone-Umeclidin-Vilant (TRELEGY ELLIPTA) 200-62.5-25 MCG/INH AEPB Inhale 1 puff into the lungs daily. Rinse mouth after each use. 02/06/21  Yes Garnet Sierras, DO  insulin degludec  (TRESIBA FLEXTOUCH) 100 UNIT/ML FlexTouch Pen Inject 28 Units into the skin daily. 07/15/22  Yes Nooruddin, Marlene Lard, MD  insulin lispro (HUMALOG KWIKPEN) 100 UNIT/ML KwikPen INJECT 12 UNITS IN THE MORNING AND 15 UNITS IN THE AFTERNOON WITH LARGEST MEALS 07/02/22  Yes Nooruddin, Marlene Lard, MD  DULoxetine (CYMBALTA) 30 MG capsule Take 2 capsules (60 mg total) by mouth daily. Patient not taking: Reported on 08/06/2022 07/14/22 01/10/23  Nooruddin, Marlene Lard, MD  HYDROcodone bit-homatropine (HYCODAN) 5-1.5 MG/5ML syrup Take 2.5 to 5 mL by mouth every 6 hours as needed for cough. Patient not taking: Reported on 08/06/2022 05/06/22   Vanessa Kick, MD  olmesartan-hydrochlorothiazide (BENICAR HCT) 20-12.5 MG tablet TAKE 1 TABLET BY MOUTH EVERY DAY Patient not taking: Reported on 08/06/2022 05/30/22   Aldine Contes, MD  omeprazole (PRILOSEC) 40 MG capsule Take 1 capsule (40 mg total) by mouth 2 (two) times daily. Patient not taking: Reported on 08/06/2022 06/24/22   Levin Erp, PA  predniSONE (DELTASONE) 20 MG tablet Take 2 tablets (40 mg total) by mouth daily. Patient not taking: Reported on 08/06/2022 05/06/22   Vanessa Kick, MD  Skin Protectants, Misc. (EUCERIN) cream Apply topically as needed for dry skin. Patient not taking: Reported on 08/06/2022 08/06/18   Oval Linsey, MD  triamcinolone (KENALOG) 0.025 % ointment APPLY TOPICALLY TWICE DAILY AS DIRECTED Patient not taking: Reported on 08/06/2022 04/24/20   [name], MD  Triamcinolone Acetonide (TRIAMCINOLONE 0.1 % CREAM : EUCERIN) CREA Apply 1 application topically 2 (two) times daily as needed. 01/13/13 04/18/13  Oval Linsey, MD    Current Outpatient Medications  Medication Sig Dispense Refill   albuterol (VENTOLIN HFA) 108 (90 Base) MCG/ACT inhaler Inhale 1-2 puffs into the lungs every 4 (four) hours as needed for wheezing or shortness of breath (coughing fits). Use with spacer. 1 each 1   atorvastatin (LIPITOR) 80 MG tablet Take 1 tablet  (80 mg total) by mouth daily. 30 tablet 11   BD PEN NEEDLE NANO 2ND GEN 32G X 4 MM MISC USE TO INJECT INSULIN 3 TIMES DAILY 300 each 3   clotrimazole-betamethasone (LOTRISONE) cream Apply 1 application. topically 2 (two) times daily. 30 g 0   Continuous Blood Gluc Sensor (FREESTYLE LIBRE 2 SENSOR) MISC Please use to monitor glucose 1 each 3   diclofenac Sodium (VOLTAREN) 1 % GEL Apply 4 g topically 4 (four) times daily. 150 g 2   Empagliflozin-metFORMIN HCl (SYNJARDY) 12.09-998 MG TABS Take 1 tablet by mouth 2 (two) times daily. 60 tablet 3   Fluticasone-Umeclidin-Vilant (TRELEGY ELLIPTA) 200-62.5-25 MCG/INH AEPB Inhale 1 puff into the lungs daily. Rinse mouth after each use. 60 each 3   insulin degludec (TRESIBA FLEXTOUCH) 100 UNIT/ML FlexTouch Pen Inject 28 Units into the skin daily. 15 mL 3   insulin lispro (HUMALOG KWIKPEN) 100 UNIT/ML KwikPen INJECT 12 UNITS IN THE MORNING AND 15 UNITS IN THE AFTERNOON WITH LARGEST  MEALS 45 mL 1   DULoxetine (CYMBALTA) 30 MG capsule Take 2 capsules (60 mg total) by mouth daily. (Patient not taking: Reported on 08/06/2022) 90 capsule 3   olmesartan-hydrochlorothiazide (BENICAR HCT) 20-12.5 MG tablet TAKE 1 TABLET BY MOUTH EVERY DAY (Patient not taking: Reported on 08/06/2022) 90 tablet 0   omeprazole (PRILOSEC) 40 MG capsule Take 1 capsule (40 mg total) by mouth 2 (two) times daily. (Patient not taking: Reported on 08/06/2022) 60 capsule 3   Skin Protectants, Misc. (EUCERIN) cream Apply topically as needed for dry skin. (Patient not taking: Reported on 08/06/2022) 454 g 1   triamcinolone (KENALOG) 0.025 % ointment APPLY TOPICALLY TWICE DAILY AS DIRECTED (Patient not taking: Reported on 08/06/2022) 30 g 3   Current Facility-Administered Medications  Medication Dose Route Frequency Provider Last Rate Last Admin   0.9 %  sodium chloride infusion  500 mL Intravenous Once Gatha Mayer, MD        Allergies as of 08/06/2022 - Review Complete 08/06/2022  Allergen  Reaction Noted   Pineapple  10/04/2020    Family History  Problem Relation Age of Onset   Diabetes Mother    Heart disease Mother 87   Colon cancer Mother    Cancer Mother        Colon   Cancer Father        Lung   Sickle cell anemia Sister    Sickle cell anemia Brother    Diabetes Sister    Brain cancer Sister    Cancer Sister        brain   Lung cancer Brother    Cancer Brother        Lung   Healthy Sister    Healthy Sister    Healthy Sister    Healthy Daughter    Healthy Son    Healthy Daughter    Healthy Daughter    Healthy Son    Wilson's disease Maternal Uncle    Breast cancer Maternal Aunt    Breast cancer Other    Esophageal cancer Neg Hx    Rectal cancer Neg Hx    Stomach cancer Neg Hx     Social History   Socioeconomic History   Marital status: Single    Spouse name: Not on file   Number of children: Not on file   Years of education: Not on file   Highest education level: 11th grade  Occupational History   Occupation: Retired    Comment: Chief Executive Officer   Occupation: Retired    Comment: [employer]  Tobacco Use   Smoking status: Every Day    Packs/day: 0.30    Years: 25.00    Total pack years: 7.50    Types: Cigarettes   Smokeless tobacco: Never   Tobacco comments:    .3 ppd  Vaping Use   Vaping Use: Never used  Substance and Sexual Activity   Alcohol use: Yes    Alcohol/week: 0.0 standard drinks of alcohol    Comment: occasionally   Drug use: No    Comment: positive marijuana metabolite in 2010   Sexual activity: Not Currently    Birth control/protection: Other-see comments, Post-menopausal    Comment: s/p tubal ligation  Other Topics Concern   Not on file  Social History Narrative   Current Social History 09/05/2020       Patient lives alone in a ground floor apartment. There is one step down without handrails to the entrance the patient uses.  Patient's method of transportation is personal car.      The highest level of  education was 11 th grade      The patient currently retired from [employer], and the [employer].      Identified important Relationships are "Aldona Bar and Mingo Amber"       Pets : [place] "[employer] / Fun: "Walking at the park"       Current Stressors: "I have no stress; I'm good"       Religious / Personal Beliefs: "Holiness"       L. [place], BSN, RN-BC    Social Determinants of Health   Financial Resource Strain: High Risk (07/14/2022)   Overall Financial Resource Strain (CARDIA)    Difficulty of Paying Living Expenses: Very hard  Food Insecurity: Unknown (07/14/2022)   Hunger Vital Sign    Worried About Running Out of Food in the Last Year: Patient refused    [place] in the Last Year: Patient refused  Transportation Needs: No Transportation Needs (07/14/2022)   PRAPARE - Hydrologist (Medical): No    Lack of Transportation (Non-Medical): No  Physical Activity: Unknown (07/14/2022)   Exercise Vital Sign    Days of Exercise per Week: Not on file    Minutes of Exercise per Session: 30 min  Stress: Stress Concern Present (07/14/2022)   [place]    Feeling of Stress : To some extent  Social Connections: Unknown (07/14/2022)   Social Connection and Isolation Panel [NHANES]    Frequency of Communication with Friends and Family: More than three times a week    Frequency of Social Gatherings with Friends and Family: Patient refused    Attends Religious Services: Patient refused    Marine scientist or Organizations: No    Attends Archivist Meetings: Never    Marital Status: Widowed  Intimate Partner Violence: Not At Risk (07/14/2022)   Humiliation, Afraid, Rape, and Kick questionnaire    Fear of Current or Ex-Partner: No    Emotionally Abused: No    Physically Abused: No    Sexually Abused: No    Review of Systems:  All other review  of systems negative except as mentioned in the HPI.  Physical Exam: Vital signs BP 133/72   Pulse 84   Temp 97.8 F (36.6 C) (Skin)   Resp (!) 25   Ht '5\' 6"'$  (1.676 m)   Wt 187 lb (84.8 kg)   LMP 08/27/2007   SpO2 99%   BMI 30.18 kg/m   General:   Alert,  Well-developed, well-nourished, pleasant and cooperative in NAD Lungs:  Clear throughout to auscultation.   Heart:  Regular rate and rhythm; no murmurs, clicks, rubs,  or gallops. Abdomen:  Soft, nontender and nondistended. Normal bowel sounds.   Neuro/Psych:  Alert and cooperative. Normal mood and affect. A and O x 3   '@Lamone Ferrelli'$  Simonne Maffucci, MD, [facility] [phone] (pager) 08/06/2022 2:40 PM@

## 2022-08-06 NOTE — Op Note (Signed)
[facility] Patient Name: Krista Sosa Procedure Date: 08/06/2022 2:37 PM MRN: [id number] Endoscopist: Gatha Mayer , MD, [id number] Age: 72 Referring MD:  Date of Birth: 1950-06-19 Gender: Female Account #: [id number] Procedure:                Upper GI endoscopy Indications:              Dysphagia Medicines:                Monitored Anesthesia Care Procedure:                Pre-Anesthesia Assessment:                           - Prior to the procedure, a History and Physical                            was performed, and patient medications and                            allergies were reviewed. The patient's tolerance of                            previous anesthesia was also reviewed. The risks                            and benefits of the procedure and the sedation                            options and risks were discussed with the patient.                            All questions were answered, and informed consent                            was obtained. Prior Anticoagulants: The patient has                            taken no anticoagulant or antiplatelet agents. ASA                            Grade Assessment: III - A patient with severe                            systemic disease. After reviewing the risks and                            benefits, the patient was deemed in satisfactory                            condition to undergo the procedure.                           After obtaining informed consent, the endoscope was  passed under direct vision. Throughout the                            procedure, the patient's blood pressure, pulse, and                            oxygen saturations were monitored continuously. The                            GIF [id number] was introduced through the                            mouth, and advanced to the second part of duodenum.                            The upper GI endoscopy was accomplished  without                            difficulty. The patient tolerated the procedure                            well. Scope In: Scope Out: Findings:                 No endoscopic abnormality was evident in the                            esophagus to explain the patient's complaint of                            dysphagia. It was decided, however, to proceed with                            dilation of the entire esophagus. The scope was                            withdrawn. Dilation was performed with a Maloney                            dilator with no resistance at 4 Fr. The dilation                            site was examined following endoscope reinsertion                            and showed no change. Estimated blood loss: none.                           Patchy mildly erythematous mucosa was found in the                            gastric fundus and in the gastric antrum. Biopsies  were taken with a cold forceps for histology.                            Verification of patient identification for the                            specimen was done. Estimated blood loss was minimal.                           The exam was otherwise without abnormality.                           The cardia and gastric fundus were otherwise normal                            on retroflexion. Complications:            No immediate complications. Estimated Blood Loss:     Estimated blood loss was minimal. Impression:               - No endoscopic esophageal abnormality to explain                            patient's dysphagia. Esophagus dilated. Dilated.                           - Erythematous mucosa in the gastric fundus and                            antrum. Biopsied.                           - The examination was otherwise normal. Recommendation:           - Patient has a contact number available for                            emergencies. The signs and symptoms of potential                             delayed complications were discussed with the                            patient. Return to normal activities tomorrow.                            Written discharge instructions were provided to the                            patient.                           - Clear liquids x 1 hour then soft foods rest of                            day. Start prior diet tomorrow.                           -  Continue present medications.                           - Await pathology results. Gatha Mayer, MD 08/06/2022 2:58:37 PM This report has been signed electronically.

## 2022-08-06 NOTE — Progress Notes (Signed)
Pt's states no medical or surgical changes since previsit or office visit. 

## 2022-08-06 NOTE — Patient Instructions (Addendum)
There was some red color changes in the stomach that could represent inflammation, maybe infection. I took biopsies to check it.  Esophagus looked ok - I did stretch it to see if you swallow better.  I will contact you with biopsy results.  Hopefully swallowing gets better. If it does not - let me know.  I appreciate the opportunity to care for you. Gatha Mayer, MD, [facility]    Follow dilatation diet given to you today  Await results of biopsies taken    YOU HAD AN ENDOSCOPIC PROCEDURE TODAY AT [facility]:   Refer to the procedure report that was given to you for any specific questions about what was found during the examination.  If the procedure report does not answer your questions, please call your gastroenterologist to clarify.  If you requested that your care partner not be given the details of your procedure findings, then the procedure report has been included in a sealed envelope for you to review at your convenience later.  YOU SHOULD EXPECT: Some feelings of bloating in the abdomen. Passage of more gas than usual.  Walking can help get rid of the air that was put into your GI tract during the procedure and reduce the bloating. If you had a lower endoscopy (such as a colonoscopy or flexible sigmoidoscopy) you may notice spotting of blood in your stool or on the toilet paper. If you underwent a bowel prep for your procedure, you may not have a normal bowel movement for a few days.  Please Note:  You might notice some irritation and congestion in your nose or some drainage.  This is from the oxygen used during your procedure.  There is no need for concern and it should clear up in a day or so.  SYMPTOMS TO REPORT IMMEDIATELY:   Following upper endoscopy (EGD)  Vomiting of blood or coffee ground material  New chest pain or pain under the shoulder blades  Painful or persistently difficult swallowing  New shortness of breath  Fever of 100F or higher  Black,  tarry-looking stools  For urgent or emergent issues, a gastroenterologist can be reached at any hour by calling [phone]. Do not use MyChart messaging for urgent concerns.    DIET:  We do recommend a small meal at first, but then you may proceed to your regular diet.  Drink plenty of fluids but you should avoid alcoholic beverages for 24 hours.  ACTIVITY:  You should plan to take it easy for the rest of today and you should NOT DRIVE or use heavy machinery until tomorrow (because of the sedation medicines used during the test).    FOLLOW UP: Our staff will call the number listed on your records the next business day following your procedure.  We will call around 7:15- 8:00 am to check on you and address any questions or concerns that you may have regarding the information given to you following your procedure. If we do not reach you, we will leave a message.     If any biopsies were taken you will be contacted by phone or by letter within the next 1-3 weeks.  Please call us at [phone] if you have not heard about the biopsies in 3 weeks.    SIGNATURES/CONFIDENTIALITY: You and/or your care partner have signed paperwork which will be entered into your electronic medical record.  These signatures attest to the fact that that the information above on your After Visit Summary has  been reviewed and is understood.  Full responsibility of the confidentiality of this discharge information lies with you and/or your care-partner.

## 2022-08-07 ENCOUNTER — Telehealth: Payer: Self-pay

## 2022-08-07 NOTE — Telephone Encounter (Signed)
Patient is returning follow up call, states she is feeling okay nothing to be concerned about but will call back if anything changes.

## 2022-08-07 NOTE — Telephone Encounter (Signed)
Follow up call placed, no answer, no VM.  

## 2022-08-13 ENCOUNTER — Ambulatory Visit (INDEPENDENT_AMBULATORY_CARE_PROVIDER_SITE_OTHER): Payer: 59 | Admitting: Student

## 2022-08-13 ENCOUNTER — Encounter: Payer: Self-pay | Admitting: Student

## 2022-08-13 VITALS — BP 138/58 | HR 74 | Temp 98.2°F | Wt 189.9 lb

## 2022-08-13 DIAGNOSIS — Z794 Long term (current) use of insulin: Secondary | ICD-10-CM

## 2022-08-13 DIAGNOSIS — F17210 Nicotine dependence, cigarettes, uncomplicated: Secondary | ICD-10-CM

## 2022-08-13 DIAGNOSIS — E1129 Type 2 diabetes mellitus with other diabetic kidney complication: Secondary | ICD-10-CM | POA: Diagnosis not present

## 2022-08-13 DIAGNOSIS — L259 Unspecified contact dermatitis, unspecified cause: Secondary | ICD-10-CM | POA: Insufficient documentation

## 2022-08-13 DIAGNOSIS — Z7984 Long term (current) use of oral hypoglycemic drugs: Secondary | ICD-10-CM

## 2022-08-13 DIAGNOSIS — J4520 Mild intermittent asthma, uncomplicated: Secondary | ICD-10-CM

## 2022-08-13 DIAGNOSIS — I10 Essential (primary) hypertension: Secondary | ICD-10-CM | POA: Diagnosis not present

## 2022-08-13 DIAGNOSIS — E1165 Type 2 diabetes mellitus with hyperglycemia: Secondary | ICD-10-CM | POA: Diagnosis not present

## 2022-08-13 DIAGNOSIS — G43709 Chronic migraine without aura, not intractable, without status migrainosus: Secondary | ICD-10-CM

## 2022-08-13 DIAGNOSIS — R809 Proteinuria, unspecified: Secondary | ICD-10-CM

## 2022-08-13 MED ORDER — TRESIBA FLEXTOUCH 100 UNIT/ML ~~LOC~~ SOPN: 20.0000 [IU] | PEN_INJECTOR | Freq: Every day | SUBCUTANEOUS | 3 refills | Status: DC

## 2022-08-13 MED ORDER — FREESTYLE LIBRE 2 SENSOR MISC: 3 refills | Status: DC

## 2022-08-13 MED ORDER — TRESIBA FLEXTOUCH 100 UNIT/ML ~~LOC~~ SOPN: 28.0000 [IU] | PEN_INJECTOR | Freq: Every day | SUBCUTANEOUS | 3 refills | Status: DC

## 2022-08-13 MED ORDER — SUMATRIPTAN SUCCINATE 50 MG PO TABS: 50.0000 mg | ORAL_TABLET | ORAL | 1 refills | Status: DC | PRN

## 2022-08-13 MED ORDER — TRELEGY ELLIPTA 200-62.5-25 MCG/ACT IN AEPB: 1.0000 | INHALATION_SPRAY | Freq: Every day | RESPIRATORY_TRACT | 1 refills | Status: DC

## 2022-08-13 MED ORDER — OLMESARTAN MEDOXOMIL-HCTZ 20-12.5 MG PO TABS: 1.0000 | ORAL_TABLET | Freq: Every day | ORAL | 2 refills | Status: DC

## 2022-08-13 MED ORDER — INSULIN LISPRO (1 UNIT DIAL) 100 UNIT/ML (KWIKPEN): PEN_INJECTOR | SUBCUTANEOUS | 1 refills | Status: DC

## 2022-08-13 NOTE — Patient Instructions (Addendum)
Thank you, [name] for allowing [place] to provide your care today. Today we discussed   Rash I think you are having an allergic reaction, please go back to your old detergent and see if it resolves. You can try oatmeal baths in the meantime. If you have a new spot please call our clinic.   Diabetes Please continue to take your Synjardy. For your insulin please take 10 Units of humlog at lunch, 15 units after dinner, and 20 units of the tresiba before bed. If you notice you are having low blood sugars please call us  High blood pressure Please continue your olmesartan HCTZ and I recommend checking your blood pressures regularly.   Asthma I will refill you inhalers  Seasonal allergies Please take your claritin and use inhalers. If symptoms persist, please follow up with your allergy doctor  GERD Continue the acid pill omeprazole  Migraines Please take this medicine sumatriptan for your migraines. If you continue to have migraines multiple times a week we can talk about medicines to take daily to prevent them from occurring.   Please call the eye center number given to you by our nursing staff  I have ordered the following labs for you:  Lab Orders  No laboratory test(s) ordered today     Referrals ordered today:   Referral Orders  No referral(s) requested today     I have ordered the following medication/changed the following medications:   Stop the following medications: Medications Discontinued During This Encounter  Medication Reason   Continuous Blood Gluc Sensor (FREESTYLE LIBRE 2 SENSOR) MISC Reorder   olmesartan-hydrochlorothiazide (BENICAR HCT) 20-12.5 MG tablet Reorder   insulin lispro (HUMALOG KWIKPEN) 100 UNIT/ML KwikPen Reorder   insulin degludec (TRESIBA FLEXTOUCH) 100 UNIT/ML FlexTouch Pen Reorder   insulin degludec (TRESIBA FLEXTOUCH) 100 UNIT/ML FlexTouch Pen Reorder   Fluticasone-Umeclidin-Vilant (TRELEGY ELLIPTA) 200-62.5-25 MCG/INH AEPB       Start the following medications: Meds ordered this encounter  Medications   Continuous Blood Gluc Sensor (FREESTYLE LIBRE 2 SENSOR) MISC    Sig: Please use to monitor glucose    Dispense:  5 each    Refill:  3    DX Code Needed  OUTOF REFILLS.   olmesartan-hydrochlorothiazide (BENICAR HCT) 20-12.5 MG tablet    Sig: Take 1 tablet by mouth daily.    Dispense:  90 tablet    Refill:  2   SUMAtriptan (IMITREX) 50 MG tablet    Sig: Take 1 tablet (50 mg total) by mouth every 2 (two) hours as needed for migraine. May repeat in 2 hours if headache persists or recurs.    Dispense:  30 tablet    Refill:  1   insulin lispro (HUMALOG KWIKPEN) 100 UNIT/ML KwikPen    Sig: INJECT 10 UNITS with lunch AND 15 UNITS with dinner    Dispense:  45 mL    Refill:  1    REFILL ASAP PLEASE' PATIENT IS OUT   DISCONTD: insulin degludec (TRESIBA FLEXTOUCH) 100 UNIT/ML FlexTouch Pen    Sig: Inject 28 Units into the skin at bedtime.    Dispense:  15 mL    Refill:  3   insulin degludec (TRESIBA FLEXTOUCH) 100 UNIT/ML FlexTouch Pen    Sig: Inject 20 Units into the skin at bedtime.    Dispense:  15 mL    Refill:  3   Fluticasone-Umeclidin-Vilant (TRELEGY ELLIPTA) 200-62.5-25 MCG/ACT AEPB    Sig: Inhale 1 puff into the lungs daily.  Dispense:  60 each    Refill:  1     Follow up:  1 month     Should you have any questions or concerns please call the internal medicine clinic at [phone].    Sanjuana Letters, D.O. [place]

## 2022-08-13 NOTE — Assessment & Plan Note (Addendum)
Assessment: Diabetes follow up today. Last A1c of 8.9%. Current regimen of tresiba 25 U in the morning, 15 U humalog in the morning, and 12 U of novolog with lung. She also takes synjardy 12.09-998 mg BID. She endorses frequent episodse of hypoglycemia during the weak. She eats a smaller breakfast and her largest meal is in the evening with dinner. We will adjust her regimen as follows  10 U of humalog with lung, 15 U with dinner and 20 U of tresiba prior QHS  Plan: - Continue humalog and tresiba as per above, if persistent hypoglycemic episodes she is instructed to call the clinic for further adjustments -Continue Synjardy 12.09-998 mg twice daily -Patient denied wanting to follow-up with Butch Penny Plyler diabetes coordinator in our clinic -Repeat A1c in 2 months -She was given number for prior eye doctor

## 2022-08-13 NOTE — Assessment & Plan Note (Signed)
Assessment: Has frequent episodes of migraine headaches.  She uses Goody powders for this frequently.  We discussed discontinuing Goody powders as they can lead to worsening of her chronic gastritis.  Will trial on sumatriptan as needed.  If persistent migraines can consider suppressive medication therapy.  Plan: -Trial sumatriptan 50 mg as needed -Follow-up migraine symptoms and if persistent consider suppressive therapy

## 2022-08-13 NOTE — Assessment & Plan Note (Addendum)
Assessment: Endorses upper extremity rash for the past few months that is itch. She states rash periodically develops on her upper extremities or chest wall. They are pruritic in nature and scab over after she itches them. She has no new lesions on exam but has scarred over lesions of her arms and chest wall. Denies lesions in-between her fingers. Denies the rash having any clear drainage or pustules. No one else in her household has any rashes.   Non-specific on exam but possibly contact dermatitis she notes changing detergents in last few months. She will call to schedule an appointment for an acute lesion we can biopsy  Plan: - follow up recurrence once she changes detergents - if acute lesion try to biopsy

## 2022-08-13 NOTE — Assessment & Plan Note (Signed)
Assessment: Inhalers refilled today.  Has followed with allergy in the past. Also recommended over-the-counter allergy medicine such as Claritin which she has used in the past  Plan: -Continue albuterol as needed and Trelegy -Claritin as needed for allergies

## 2022-08-13 NOTE — Assessment & Plan Note (Signed)
Assessment: Current regimen of olmesartan-HCTZ 20-12.5 mg daily. BP 138/58. Not at goal but with lower diastolic will wait to increase or add additional medications such as amlodipien.   Plan: -Continue olmesartan HCTZ 20-12.5 daily - consider addition of amlodipine

## 2022-08-13 NOTE — Progress Notes (Signed)
CC: follow up diabetes  HPI:  Krista Sosa is a 72 y.o. female living with a history stated below and presents today for follow up of her diabetes. Please see problem based assessment and plan for additional details.  Past Medical History:  Diagnosis Date   Allergy    Asthma, chronic 06/09/2006   Bilateral cataracts 05/07/2016   s/p left cataract extraction 10/02/2016 and right cataract extraction 10/16/2016   Bronchiectasis without complication ([place]) 99991111   Mild left lower lobe bronchiectasis seen on CT scan of the thorax in December 2019   Cataract    bil eyes   Essential hypertension 02/12/2018   Gastroesophageal reflux disease 08/13/2011   Glaucoma    Per patient report   Hx of adenomatous colonic polyps 11/16/2008   Hypertension    pt said she is supposed to take med but she is not taking it.   Hypertensive retinopathy, grade 2, bilateral 05/07/2016   Inflamed epidermoid cyst of skin 07/02/2020   Latent tuberculosis    dx 03/17/08 - tx with INH X 9 months   Lichenoid dermatitis 02/13/2012   Previously treated with Triamcinolone 0.025% cream Q8-12H PRN    Migraine headache 08/06/2018   Osteoarthritis, generalized 05/21/2006   Diffuse: cervical spine, right shoulder, left wrist Ineffective medications: Tylenol, ASA, Naprosyn, Ibuprofen, Meloxicam     Overweight (BMI 25.0-29.9) 02/13/2012   Perennial allergic rhinitis with seasonal variation 06/09/2006   Persistent microalbuminuria associated with type II diabetes mellitus ([place]) 05/21/2006   Radiculopathy affecting upper extremity 01/02/2019   Sickle cell anemia ([place])    pt has sickle cell trait per pt   Sickle cell trait ([place]) 02/13/2012   Smoker    1/2ppd   Split ear lobe 01/04/2019   Tobacco abuse disorder 06/09/2006   Tubular adenoma of rectum 11/16/2008   5 mm, excised endoscopically 11/16/2008, repeat colonoscopy due July 2015    Ulnar neuropathy of right upper extremity 02/14/2016   Urinary tract disease    JICP    Urticaria     Current Outpatient Medications on File Prior to Visit  Medication Sig Dispense Refill   albuterol (VENTOLIN HFA) 108 (90 Base) MCG/ACT inhaler Inhale 1-2 puffs into the lungs every 4 (four) hours as needed for wheezing or shortness of breath (coughing fits). Use with spacer. 1 each 1   atorvastatin (LIPITOR) 80 MG tablet Take 1 tablet (80 mg total) by mouth daily. 30 tablet 11   BD PEN NEEDLE NANO 2ND GEN 32G X 4 MM MISC USE TO INJECT INSULIN 3 TIMES DAILY 300 each 3   clotrimazole-betamethasone (LOTRISONE) cream Apply 1 application. topically 2 (two) times daily. 30 g 0   diclofenac Sodium (VOLTAREN) 1 % GEL Apply 4 g topically 4 (four) times daily. 150 g 2   DULoxetine (CYMBALTA) 30 MG capsule Take 2 capsules (60 mg total) by mouth daily. (Patient not taking: Reported on 08/06/2022) 90 capsule 3   Empagliflozin-metFORMIN HCl (SYNJARDY) 12.09-998 MG TABS Take 1 tablet by mouth 2 (two) times daily. 60 tablet 3   omeprazole (PRILOSEC) 40 MG capsule Take 1 capsule (40 mg total) by mouth 2 (two) times daily. (Patient not taking: Reported on 08/06/2022) 60 capsule 3   Skin Protectants, Misc. (EUCERIN) cream Apply topically as needed for dry skin. (Patient not taking: Reported on 08/06/2022) 454 g 1   triamcinolone (KENALOG) 0.025 % ointment APPLY TOPICALLY TWICE DAILY AS DIRECTED (Patient not taking: Reported on 08/06/2022) 30 g 3   [DISCONTINUED] Triamcinolone  Acetonide (TRIAMCINOLONE 0.1 % CREAM : EUCERIN) CREA Apply 1 application topically 2 (two) times daily as needed. 454 each 3   No current facility-administered medications on file prior to visit.    Review of Systems: ROS negative except for what is noted on the assessment and plan.  Vitals:   08/13/22 1019 08/13/22 1103  BP: (!) 150/76 (!) 138/58  Pulse: 85 74  Temp: 98.2 F (36.8 C)   TempSrc: Oral   SpO2: 99%   Weight: 189 lb 14.4 oz (86.1 kg)     Physical Exam: Constitutional: well-appearing, in no acute  distress HENT: normocephalic atraumatic Eyes: conjunctiva non-erythematous Neck: supple Pulmonary/Chest: normal work of breathing on room air Abdominal: soft, non-tender, non-distended Neurological: alert  Skin: warm and dry. Multple scabbed over lesions of upper extremity and chest wall.  Psych: normal mood  Assessment & Plan:   Uncontrolled type 2 diabetes mellitus with hyperglycemia, with long-term current use of insulin (HCC) Assessment: Diabetes follow up today. Last A1c of 8.9%. Current regimen of tresiba 25 U in the morning, 15 U humalog in the morning, and 12 U of novolog with lung. She also takes synjardy 12.09-998 mg BID. She endorses frequent episodse of hypoglycemia during the weak. She eats a smaller breakfast and her largest meal is in the evening with dinner. We will adjust her regimen as follows  10 U of humalog with lung, 15 U with dinner and 20 U of tresiba prior QHS  Plan: - Continue humalog and tresiba as per above, if persistent hypoglycemic episodes she is instructed to call the clinic for further adjustments -Continue Synjardy 12.09-998 mg twice daily -Patient denied wanting to follow-up with Butch Penny Plyler diabetes coordinator in our clinic -Repeat A1c in 2 months -She was given number for prior eye doctor  Essential hypertension Assessment: Current regimen of olmesartan-HCTZ 20-12.5 mg daily. BP 138/58. Not at goal but with lower diastolic will wait to increase or add additional medications such as amlodipien.   Plan: -Continue olmesartan HCTZ 20-12.5 daily - consider addition of amlodipine   Migraine headache Assessment: Has frequent episodes of migraine headaches.  She uses Goody powders for this frequently.  We discussed discontinuing Goody powders as they can lead to worsening of her chronic gastritis.  Will trial on sumatriptan as needed.  If persistent migraines can consider suppressive medication therapy.  Plan: -Trial sumatriptan 50 mg as  needed -Follow-up migraine symptoms and if persistent consider suppressive therapy  Mild intermittent asthma Assessment: Inhalers refilled today.  Has followed with allergy in the past. Also recommended over-the-counter allergy medicine such as Claritin which she has used in the past  Plan: -Continue albuterol as needed and Trelegy -Claritin as needed for allergies  Contact dermatitis Assessment: Endorses upper extremity rash for the past few months that is itch. She states rash periodically develops on her upper extremities or chest wall. They are pruritic in nature and scab over after she itches them. She has no new lesions on exam but has scarred over lesions of her arms and chest wall. Denies lesions in-between her fingers. Denies the rash having any clear drainage or pustules. No one else in her household has any rashes.   Non-specific on exam but possibly contact dermatitis she notes changing detergents in last few months. She will call to schedule an appointment for an acute lesion we can biopsy  Plan: - follow up recurrence once she changes detergents - if acute lesion try to biopsy  Patient discussed with Dr. Daryll Drown  Sanjuana Letters, D.O. [facility], PGY-3 Phone: [phone] Date 08/13/2022 Time 8:34 PM

## 2022-08-18 NOTE — Telephone Encounter (Signed)
Thank you :)

## 2022-08-26 NOTE — Progress Notes (Signed)
Internal Medicine Clinic Attending  Case discussed with Dr. Katsadouros at the time of the visit.  We reviewed the resident's history and exam and pertinent patient test results.  I agree with the assessment, diagnosis, and plan of care documented in the resident's note.  

## 2022-09-02 ENCOUNTER — Other Ambulatory Visit: Payer: Self-pay

## 2022-09-02 DIAGNOSIS — L280 Lichen simplex chronicus: Secondary | ICD-10-CM

## 2022-09-02 DIAGNOSIS — M1711 Unilateral primary osteoarthritis, right knee: Secondary | ICD-10-CM

## 2022-09-02 DIAGNOSIS — E1129 Type 2 diabetes mellitus with other diabetic kidney complication: Secondary | ICD-10-CM

## 2022-09-02 MED ORDER — CLOTRIMAZOLE-BETAMETHASONE 1-0.05 % EX CREA: 1.0000 | TOPICAL_CREAM | Freq: Two times a day (BID) | CUTANEOUS | 0 refills | Status: DC

## 2022-09-02 MED ORDER — ATORVASTATIN CALCIUM 80 MG PO TABS: 80.0000 mg | ORAL_TABLET | Freq: Every day | ORAL | 11 refills | Status: DC

## 2022-09-02 MED ORDER — BD PEN NEEDLE NANO 2ND GEN 32G X 4 MM MISC: 3 refills | Status: DC

## 2022-09-02 MED ORDER — ALBUTEROL SULFATE HFA 108 (90 BASE) MCG/ACT IN AERS: 1.0000 | INHALATION_SPRAY | RESPIRATORY_TRACT | 1 refills | Status: AC | PRN

## 2022-09-02 MED ORDER — TRIAMCINOLONE ACETONIDE 0.025 % EX OINT: TOPICAL_OINTMENT | CUTANEOUS | 3 refills | Status: AC

## 2022-09-02 MED ORDER — DICLOFENAC SODIUM 1 % EX GEL: 4.0000 g | Freq: Four times a day (QID) | CUTANEOUS | 2 refills | Status: AC

## 2022-09-18 ENCOUNTER — Other Ambulatory Visit: Payer: Self-pay

## 2022-09-18 ENCOUNTER — Other Ambulatory Visit: Payer: Self-pay | Admitting: Student

## 2022-09-18 ENCOUNTER — Ambulatory Visit (INDEPENDENT_AMBULATORY_CARE_PROVIDER_SITE_OTHER): Payer: 59

## 2022-09-18 VITALS — BP 151/60 | HR 76 | Ht 66.0 in | Wt 183.1 lb

## 2022-09-18 DIAGNOSIS — I10 Essential (primary) hypertension: Secondary | ICD-10-CM

## 2022-09-18 DIAGNOSIS — R809 Proteinuria, unspecified: Secondary | ICD-10-CM

## 2022-09-19 ENCOUNTER — Other Ambulatory Visit: Payer: Self-pay | Admitting: Physician Assistant

## 2022-09-19 MED ORDER — FREESTYLE LIBRE 2 SENSOR MISC: 3 refills | Status: DC

## 2022-09-19 NOTE — Progress Notes (Signed)
Patient left before being seen. Rescheduled.

## 2022-09-23 ENCOUNTER — Encounter: Payer: 59 | Admitting: Internal Medicine

## 2022-09-23 NOTE — Progress Notes (Deleted)
   CC: ***  HPI:[name] is a 72 y.o. female who presents for evaluation of ***. Please see individual problem based A/P for details.  1 month f/u  Assessment: ***  Plan: ***  ***  Assessment: ***  Plan: ***  ***  Assessment: ***  Plan: ***   Depression, PHQ-9: Based on the patients  Flowsheet Row Office Visit from 03/04/2022 in [facility]  PHQ-9 Total Score 7      score we have ***.  Past Medical History:  Diagnosis Date   Allergy    Asthma, chronic 06/09/2006   Bilateral cataracts 05/07/2016   s/p left cataract extraction 10/02/2016 and right cataract extraction 10/16/2016   Bronchiectasis without complication (HCC) 05/13/2018   Mild left lower lobe bronchiectasis seen on CT scan of the thorax in December 2019   Cataract    bil eyes   Essential hypertension 02/12/2018   Gastroesophageal reflux disease 08/13/2011   Glaucoma    Per patient report   Hx of adenomatous colonic polyps 11/16/2008   Hypertension    pt said she is supposed to take med but she is not taking it.   Hypertensive retinopathy, grade 2, bilateral 05/07/2016   Inflamed epidermoid cyst of skin 07/02/2020   Latent tuberculosis    dx 03/17/08 - tx with INH X 9 months   Lichenoid dermatitis 02/13/2012   Previously treated with Triamcinolone 0.025% cream Q8-12H PRN    Migraine headache 08/06/2018   Osteoarthritis, generalized 05/21/2006   Diffuse: cervical spine, right shoulder, left wrist Ineffective medications: Tylenol, ASA, Naprosyn, Ibuprofen, Meloxicam     Overweight (BMI 25.0-29.9) 02/13/2012   Perennial allergic rhinitis with seasonal variation 06/09/2006   Persistent microalbuminuria associated with type II diabetes mellitus (HCC) 05/21/2006   Radiculopathy affecting upper extremity 01/02/2019   Sickle cell anemia (HCC)    pt has sickle cell trait per pt   Sickle cell trait (HCC) 02/13/2012   Smoker    1/2ppd   Split ear lobe 01/04/2019   Tobacco  abuse disorder 06/09/2006   Tubular adenoma of rectum 11/16/2008   5 mm, excised endoscopically 11/16/2008, repeat colonoscopy due July 2015    Ulnar neuropathy of right upper extremity 02/14/2016   Urinary tract disease    JICP   Urticaria    Review of Systems:   ROS   Physical Exam: There were no vitals filed for this visit.   General: *** HEENT: Conjunctiva nl , antiicteric sclerae, moist mucous membranes, no exudate or erythema Cardiovascular: Normal rate, regular rhythm.  No murmurs, rubs, or gallops Pulmonary : Equal breath sounds, No wheezes, rales, or rhonchi Abdominal: soft, nontender,  bowel sounds present Ext: No edema in lower extremities, no tenderness to palpation of lower extremities.   Assessment & Plan:   See Encounters Tab for problem based charting.  Patient {GC/GE:[id number]::"discussed with","seen with"} Dr. {[id number]:[id number]::"N. Hoffman","Guilloud","Mullen","Narendra","Raines","Vincent","Williams"}

## 2022-09-24 ENCOUNTER — Encounter: Payer: 59 | Admitting: Internal Medicine

## 2022-09-29 ENCOUNTER — Other Ambulatory Visit: Payer: Self-pay | Admitting: Student

## 2022-09-29 DIAGNOSIS — E1129 Type 2 diabetes mellitus with other diabetic kidney complication: Secondary | ICD-10-CM

## 2022-10-22 ENCOUNTER — Ambulatory Visit (HOSPITAL_COMMUNITY): Payer: 59

## 2022-11-08 ENCOUNTER — Other Ambulatory Visit: Payer: Self-pay | Admitting: Student

## 2022-11-08 DIAGNOSIS — E1129 Type 2 diabetes mellitus with other diabetic kidney complication: Secondary | ICD-10-CM

## 2022-12-04 ENCOUNTER — Other Ambulatory Visit: Payer: Self-pay | Admitting: Student

## 2022-12-04 DIAGNOSIS — G8929 Other chronic pain: Secondary | ICD-10-CM

## 2022-12-04 DIAGNOSIS — M792 Neuralgia and neuritis, unspecified: Secondary | ICD-10-CM

## 2022-12-09 ENCOUNTER — Other Ambulatory Visit: Payer: Self-pay | Admitting: Internal Medicine

## 2022-12-22 ENCOUNTER — Encounter: Payer: Self-pay | Admitting: Podiatry

## 2022-12-22 ENCOUNTER — Ambulatory Visit (INDEPENDENT_AMBULATORY_CARE_PROVIDER_SITE_OTHER): Payer: 59 | Admitting: Podiatry

## 2022-12-22 DIAGNOSIS — E1165 Type 2 diabetes mellitus with hyperglycemia: Secondary | ICD-10-CM | POA: Diagnosis not present

## 2022-12-22 DIAGNOSIS — Z794 Long term (current) use of insulin: Secondary | ICD-10-CM

## 2022-12-22 DIAGNOSIS — M79675 Pain in left toe(s): Secondary | ICD-10-CM

## 2022-12-22 DIAGNOSIS — M79674 Pain in right toe(s): Secondary | ICD-10-CM

## 2022-12-22 DIAGNOSIS — B351 Tinea unguium: Secondary | ICD-10-CM | POA: Diagnosis not present

## 2022-12-22 NOTE — Progress Notes (Signed)
This patient returns to my office for at risk foot care.  This patient requires this care by a professional since this patient will be at risk due to having diabetes and amputation big toes  B/L.  This patient is unable to cut nails herself since the patient cannot reach her nails.These nails are painful walking and wearing shoes.  This patient presents for at risk foot care today.  General Appearance  Alert, conversant and in no acute stress.  Vascular  Dorsalis pedis and posterior tibial  pulses are palpable  bilaterally.  Capillary return is within normal limits  bilaterally. Temperature is within normal limits  bilaterally.  Neurologic  Senn-Weinstein monofilament wire test within normal limits  bilaterally. Muscle power within normal limits bilaterally.  Nails Thick disfigured discolored nails with subungual debris  from hallux to fifth toes bilaterally. No evidence of bacterial infection or drainage bilaterally.  Orthopedic  No limitations of motion  feet .  No crepitus or effusions noted.  No bony pathology or digital deformities noted. Amputation distal aspect hallux  B/L.  Skin  normotropic skin with no porokeratosis noted bilaterally.  No signs of infections or ulcers noted.     Onychomycosis  Pain in right toes  Pain in left toes  Consent was obtained for treatment procedures.   Mechanical debridement of nails 1-5  bilaterally performed with a nail nipper.  Filed with dremel without incident.    Return office visit    4 months                  Told patient to return for periodic foot care and evaluation due to potential at risk complications.   Helane Gunther DPM

## 2022-12-23 ENCOUNTER — Other Ambulatory Visit: Payer: Self-pay

## 2022-12-23 DIAGNOSIS — I10 Essential (primary) hypertension: Secondary | ICD-10-CM

## 2022-12-23 MED ORDER — OLMESARTAN MEDOXOMIL-HCTZ 20-12.5 MG PO TABS
1.0000 | ORAL_TABLET | Freq: Every day | ORAL | 2 refills | Status: AC
Start: 2022-12-23 — End: ?

## 2023-02-21 ENCOUNTER — Other Ambulatory Visit: Payer: Self-pay | Admitting: Nurse Practitioner

## 2023-02-21 DIAGNOSIS — Z780 Asymptomatic menopausal state: Secondary | ICD-10-CM

## 2023-02-23 ENCOUNTER — Other Ambulatory Visit: Payer: Self-pay

## 2023-02-23 DIAGNOSIS — I10 Essential (primary) hypertension: Secondary | ICD-10-CM

## 2023-02-23 MED ORDER — OLMESARTAN MEDOXOMIL-HCTZ 20-12.5 MG PO TABS: 1.0000 | ORAL_TABLET | Freq: Every day | ORAL | 2 refills | Status: DC

## 2023-04-27 ENCOUNTER — Ambulatory Visit: Payer: 59 | Admitting: Podiatry

## 2023-05-11 ENCOUNTER — Ambulatory Visit: Payer: 59

## 2023-05-11 VITALS — Ht 66.0 in | Wt 182.0 lb

## 2023-05-11 DIAGNOSIS — Z860101 Personal history of adenomatous and serrated colon polyps: Secondary | ICD-10-CM

## 2023-05-11 NOTE — Progress Notes (Signed)

## 2023-06-08 NOTE — Progress Notes (Signed)
 [facility] History and Physical   Primary Care Physician:  Nooruddin, Saad, MD   Reason for Procedure:  History of colon polyps  Plan:    Colonoscopy     HPI: Krista Sosa is a 73 y.o. female with a history of adenomatous colon polyps as outlined below, she presents for a repeat colonoscopy.  Chart reviewed today indicates mother had colon cancer-she was elderly and died at age 52  5 mm, excised endoscopically 11/16/2008, 3 mm and 6 mm, excised endoscopically 12/24/2015, repeat colonoscopy due July 2022. UPDATE 2024 Past Medical History:  Diagnosis Date   Allergy    Asthma, chronic 06/09/2006   Bilateral cataracts 05/07/2016   s/p left cataract extraction 10/02/2016 and right cataract extraction 10/16/2016   Bronchiectasis without complication (HCC) 05/13/2018   Mild left lower lobe bronchiectasis seen on CT scan of the thorax in December 2019   Cataract    bil eyes   Essential hypertension 02/12/2018   Gastroesophageal reflux disease 08/13/2011   Glaucoma    Per patient report   Hx of adenomatous colonic polyps 11/16/2008   Hypertension    pt said she is supposed to take med but she is not taking it.   Hypertensive retinopathy, grade 2, bilateral 05/07/2016   Inflamed epidermoid cyst of skin 07/02/2020   Latent tuberculosis    dx 03/17/08 - tx with INH X 9 months   Lichenoid dermatitis 02/13/2012   Previously treated with Triamcinolone  0.025% cream Q8-12H PRN    Migraine headache 08/06/2018   Osteoarthritis, generalized 05/21/2006   Diffuse: cervical spine, right shoulder, left wrist Ineffective medications: Tylenol , ASA, Naprosyn , Ibuprofen , Meloxicam      Overweight (BMI 25.0-29.9) 02/13/2012   Perennial allergic rhinitis with seasonal variation 06/09/2006   Persistent microalbuminuria associated with type II diabetes mellitus (HCC) 05/21/2006   Radiculopathy affecting upper extremity 01/02/2019   Sickle cell anemia (HCC)    pt has sickle cell trait per pt    Sickle cell trait (HCC) 02/13/2012   Smoker    1/2ppd   Split ear lobe 01/04/2019   Tobacco abuse disorder 06/09/2006   Tubular adenoma of rectum 11/16/2008   5 mm, excised endoscopically 11/16/2008, repeat colonoscopy due July 2015    Ulnar neuropathy of right upper extremity 02/14/2016   Urinary tract disease    JICP   Urticaria     Past Surgical History:  Procedure Laterality Date   APPENDECTOMY     CATARACT EXTRACTION, BILATERAL Bilateral    OS 10/10/2016, OD 10/16/2016   COLONOSCOPY  11/16/08   EXCISION OF KELOID N/A 09/19/2020   Procedure: EXCISION OF CHRONICALLY INFECTED UPPER BACK CYST;  Surgeon: Vernetta Berg, MD;  Location: [facility];  Service: General;  Laterality: N/A;   FOOT SURGERY  07/31/2008   Exostectomy right hallus   TONSILLECTOMY     TUBAL LIGATION      Prior to Admission medications   Medication Sig Start Date End Date Taking? Authorizing Provider  albuterol  (VENTOLIN  HFA) 108 (90 Base) MCG/ACT inhaler Inhale 1-2 puffs into the lungs every 4 (four) hours as needed for wheezing or shortness of breath (coughing fits). Use with spacer. 09/02/22   Nooruddin, Saad, MD  atorvastatin  (LIPITOR) 80 MG tablet TAKE 1 TABLET BY MOUTH DAILY 12/11/22   Nooruddin, Saad, MD  clotrimazole -betamethasone  (LOTRISONE ) cream Apply 1 Application topically 2 (two) times daily. 09/02/22   Nooruddin, Saad, MD  Continuous Glucose Sensor (FREESTYLE LIBRE 2 SENSOR) MISC Please use to monitor glucose 09/19/22  Nooruddin, Saad, MD  diclofenac  Sodium (VOLTAREN ) 1 % GEL Apply 4 g topically 4 (four) times daily. 09/02/22   Nooruddin, Saad, MD  DULoxetine  (CYMBALTA ) 30 MG capsule TAKE 2 CAPSULES BY MOUTH DAILY 12/05/22   Nooruddin, Saad, MD  insulin  degludec (TRESIBA  FLEXTOUCH) 100 UNIT/ML FlexTouch Pen Inject 20 Units into the skin at bedtime. 08/13/22   Katsadouros, Vasilios, MD  insulin  lispro (HUMALOG  KWIKPEN) 100 UNIT/ML KwikPen INJECT SUBCUTANEOUSLY 12 UNITS  IN THE MORNING AND 15 UNITS  IN  THE EVENING WITH LARGEST MEALS 11/11/22   Leopold Damien NOVAK, MD  Insulin  Pen Needle (BD PEN NEEDLE NANO 2ND GEN) 32G X 4 MM MISC USE TO INJECT INSULIN  3 TIMES DAILY 09/02/22   Nooruddin, Saad, MD  methocarbamol  (ROBAXIN ) 500 MG tablet Take 500 mg by mouth every 8 (eight) hours as needed. 02/18/23   [provider]  olmesartan -hydrochlorothiazide  (BENICAR  HCT) 20-12.5 MG tablet Take 1 tablet by mouth daily. 02/23/23   Nooruddin, Saad, MD  omeprazole  (PRILOSEC) 40 MG capsule TAKE 1 CAPSULE BY MOUTH DAILY 09/22/22   Beather Delon Gibson, PA  Skin Protectants, Misc. (EUCERIN) cream Apply topically as needed for dry skin. 08/06/18   Teofilo Satterfield, MD  SUMAtriptan  (IMITREX ) 50 MG tablet Take 1 tablet (50 mg total) by mouth every 2 (two) hours as needed for migraine. May repeat in 2 hours if headache persists or recurs. 08/13/22   Katsadouros, Vasilios, MD  SYNJARDY  12.09-998 MG TABS TAKE 1 TABLET BY MOUTH TWICE  DAILY 10/01/22   Nooruddin, Saad, MD  TRELEGY ELLIPTA  200-62.5-25 MCG/ACT AEPB INHALE 1 INHALATION BY MOUTH  DAILY 09/19/22   Nooruddin, Saad, MD  triamcinolone  (KENALOG ) 0.025 % ointment APPLY TOPICALLY TWICE DAILY AS DIRECTED 09/02/22   Nooruddin, Saad, MD  Triamcinolone  Acetonide (TRIAMCINOLONE  0.1 % CREAM : EUCERIN) CREA Apply 1 application topically 2 (two) times daily as needed. 01/13/13 04/18/13  Teofilo Satterfield, MD    Current Outpatient Medications  Medication Sig Dispense Refill   albuterol  (VENTOLIN  HFA) 108 (90 Base) MCG/ACT inhaler Inhale 1-2 puffs into the lungs every 4 (four) hours as needed for wheezing or shortness of breath (coughing fits). Use with spacer. 1 each 1   atorvastatin  (LIPITOR) 80 MG tablet TAKE 1 TABLET BY MOUTH DAILY 90 tablet 3   Continuous Glucose Sensor (FREESTYLE LIBRE 2 SENSOR) MISC Please use to monitor glucose 5 each 3   insulin  lispro (HUMALOG  KWIKPEN) 100 UNIT/ML KwikPen INJECT SUBCUTANEOUSLY 12 UNITS  IN THE MORNING AND 15 UNITS IN  THE EVENING WITH  LARGEST MEALS 30 mL 2   Insulin  Pen Needle (BD PEN NEEDLE NANO 2ND GEN) 32G X 4 MM MISC USE TO INJECT INSULIN  3 TIMES DAILY 300 each 3   omeprazole  (PRILOSEC) 40 MG capsule TAKE 1 CAPSULE BY MOUTH DAILY 60 capsule 5   SYNJARDY  12.09-998 MG TABS TAKE 1 TABLET BY MOUTH TWICE  DAILY 120 tablet 5   clotrimazole -betamethasone  (LOTRISONE ) cream Apply 1 Application topically 2 (two) times daily. 30 g 0   diclofenac  Sodium (VOLTAREN ) 1 % GEL Apply 4 g topically 4 (four) times daily. 150 g 2   DULoxetine  (CYMBALTA ) 30 MG capsule TAKE 2 CAPSULES BY MOUTH DAILY 90 capsule 5   insulin  degludec (TRESIBA  FLEXTOUCH) 100 UNIT/ML FlexTouch Pen Inject 20 Units into the skin at bedtime. 15 mL 3   methocarbamol  (ROBAXIN ) 500 MG tablet Take 500 mg by mouth every 8 (eight) hours as needed.     olmesartan -hydrochlorothiazide  (BENICAR  HCT) 20-12.5 MG tablet Take 1  tablet by mouth daily. 90 tablet 2   Skin Protectants, Misc. (EUCERIN) cream Apply topically as needed for dry skin. 454 g 1   SUMAtriptan  (IMITREX ) 50 MG tablet Take 1 tablet (50 mg total) by mouth every 2 (two) hours as needed for migraine. May repeat in 2 hours if headache persists or recurs. 30 tablet 1   TRELEGY ELLIPTA  200-62.5-25 MCG/ACT AEPB INHALE 1 INHALATION BY MOUTH  DAILY 60 each 11   triamcinolone  (KENALOG ) 0.025 % ointment APPLY TOPICALLY TWICE DAILY AS DIRECTED 30 g 3   Current Facility-Administered Medications  Medication Dose Route Frequency Provider Last Rate Last Admin   0.9 %  sodium chloride  infusion  500 mL Intravenous Once Avram Lupita BRAVO, MD        Allergies as of 06/09/2023 - Review Complete 06/09/2023  Allergen Reaction Noted   Pineapple Hives 10/04/2020    Family History  Problem Relation Age of Onset   Diabetes Mother    Heart disease Mother 75   Colon cancer Mother    Cancer Mother        Colon   Cancer Father        Lung   Sickle cell anemia Sister    Diabetes Sister    Brain cancer Sister    Cancer Sister         brain   Healthy Sister    Healthy Sister    Healthy Sister    Sickle cell anemia Brother    Lung cancer Brother    Cancer Brother        Lung   Breast cancer Maternal Aunt    Wilson's disease Maternal Engineer, Water Daughter    Healthy Daughter    Healthy Daughter    Healthy Son    Healthy Son    Breast cancer Other    Esophageal cancer Neg Hx    Rectal cancer Neg Hx    Stomach cancer Neg Hx    Colon polyps Neg Hx     Social History   Socioeconomic History   Marital status: Single    Spouse name: Not on file   Number of children: Not on file   Years of education: Not on file   Highest education level: 11th grade  Occupational History   Occupation: Retired    Comment: Dispensing optician   Occupation: Retired    Comment: [employer]  Tobacco Use   Smoking status: Every Day    Current packs/day: 0.30    Average packs/day: 0.3 packs/day for 25.0 years (7.5 ttl pk-yrs)    Types: Cigarettes   Smokeless tobacco: Never   Tobacco comments:    .3 ppd  Vaping Use   Vaping status: Never Used  Substance and Sexual Activity   Alcohol use: Yes    Alcohol/week: 0.0 standard drinks of alcohol    Comment: occasionally   Drug use: No    Comment: positive marijuana metabolite in 2010   Sexual activity: Not Currently    Birth control/protection: Other-see comments, Post-menopausal    Comment: s/p tubal ligation  Other Topics Concern   Not on file  Social History Narrative   Current Social History 09/05/2020       Patient lives alone in a ground floor apartment. There is one step down without handrails to the entrance the patient uses.       Patient's method of transportation is personal car.      The highest level of education was 11 th grade  The patient currently retired from [employer], and the [employer].      Identified important Relationships are Lucie and Kathi       Pets : Eddy Deem       Interests / Fun: Walking at the park        Current Stressors: I have no stress; I'm good       Religious / Personal Beliefs: Holiness       L. Ducatte, BSN, RN-BC    Social Drivers of Health   Financial Resource Strain: High Risk (07/14/2022)   Overall Financial Resource Strain (CARDIA)    Difficulty of Paying Living Expenses: Very hard  Food Insecurity: Patient Declined (07/14/2022)   Hunger Vital Sign    Worried About Running Out of Food in the Last Year: Patient declined    Ran Out of Food in the Last Year: Patient declined  Transportation Needs: No Transportation Needs (07/14/2022)   PRAPARE - Administrator, Civil Service (Medical): No    Lack of Transportation (Non-Medical): No  Physical Activity: Unknown (07/14/2022)   Exercise Vital Sign    Days of Exercise per Week: Not on file    Minutes of Exercise per Session: 30 min  Stress: Stress Concern Present (07/14/2022)   [employer] of Occupational Health - Occupational Stress Questionnaire    Feeling of Stress : To some extent  Social Connections: Unknown (07/14/2022)   Social Connection and Isolation Panel [NHANES]    Frequency of Communication with Friends and Family: More than three times a week    Frequency of Social Gatherings with Friends and Family: Patient declined    Attends Religious Services: Patient declined    Database Administrator or Organizations: No    Attends Banker Meetings: Never    Marital Status: Widowed  Intimate Partner Violence: Not At Risk (07/14/2022)   Humiliation, Afraid, Rape, and Kick questionnaire    Fear of Current or Ex-Partner: No    Emotionally Abused: No    Physically Abused: No    Sexually Abused: No    Review of Systems:  All other review of systems negative except as mentioned in the HPI.  Physical Exam: Vital signs BP (!) 145/68   Pulse 76   Temp (!) 97.2 F (36.2 C) (Temporal)   Resp (!) 23   Ht 5' 6 (1.676 m)   Wt 182 lb (82.6 kg)   LMP 08/27/2007   SpO2 99%   BMI 29.38 kg/m    General:   Alert,  Well-developed, well-nourished, pleasant and cooperative in NAD Lungs:  Clear throughout to auscultation.   Heart:  Regular rate and rhythm; no murmurs, clicks, rubs,  or gallops. Abdomen:  Soft, nontender and nondistended. Normal bowel sounds.   Neuro/Psych:  Alert and cooperative. Normal mood and affect. A and O x 3   @Maleah Rabago  CHARLENA Commander, MD, [facility] [phone] (pager) 06/09/2023 1:24 PM@

## 2023-06-09 ENCOUNTER — Encounter: Payer: Self-pay | Admitting: Internal Medicine

## 2023-06-09 ENCOUNTER — Ambulatory Visit (AMBULATORY_SURGERY_CENTER): Payer: 59 | Admitting: Internal Medicine

## 2023-06-09 VITALS — BP 137/98 | HR 76 | Temp 97.2°F | Resp 14 | Ht 66.0 in | Wt 182.0 lb

## 2023-06-09 DIAGNOSIS — Z1211 Encounter for screening for malignant neoplasm of colon: Secondary | ICD-10-CM

## 2023-06-09 DIAGNOSIS — Z860101 Personal history of adenomatous and serrated colon polyps: Secondary | ICD-10-CM

## 2023-06-09 DIAGNOSIS — D123 Benign neoplasm of transverse colon: Secondary | ICD-10-CM | POA: Diagnosis not present

## 2023-06-09 DIAGNOSIS — K5730 Diverticulosis of large intestine without perforation or abscess without bleeding: Secondary | ICD-10-CM

## 2023-06-09 MED ORDER — SODIUM CHLORIDE 0.9 % IV SOLN
500.0000 mL | Freq: Once | INTRAVENOUS | Status: DC
Start: 2023-06-09 — End: 2023-06-09

## 2023-06-09 NOTE — Progress Notes (Signed)
 To pacu, VSS. Report to [email or web address].tb

## 2023-06-09 NOTE — Patient Instructions (Addendum)
 I found and removed 2 small polyps that look benign.  I will let you know pathology results - I am recommending a repeat exam in 5 years.  You also have a condition called diverticulosis - common and not usually a problem. Please read the handout provided.  I appreciate the opportunity to care for you. Krista CHARLENA Commander, MD, FACG   YOU HAD AN ENDOSCOPIC PROCEDURE TODAY AT THE [facility]:   Refer to the procedure report that was given to you for any specific questions about what was found during the examination.  If the procedure report does not answer your questions, please call your gastroenterologist to clarify.  If you requested that your care partner not be given the details of your procedure findings, then the procedure report has been included in a sealed envelope for you to review at your convenience later.  YOU SHOULD EXPECT: Some feelings of bloating in the abdomen. Passage of more gas than usual.  Walking can help get rid of the air that was put into your GI tract during the procedure and reduce the bloating. If you had a lower endoscopy (such as a colonoscopy or flexible sigmoidoscopy) you may notice spotting of blood in your stool or on the toilet paper. If you underwent a bowel prep for your procedure, you may not have a normal bowel movement for a few days.  Please Note:  You might notice some irritation and congestion in your nose or some drainage.  This is from the oxygen used during your procedure.  There is no need for concern and it should clear up in a day or so.  SYMPTOMS TO REPORT IMMEDIATELY:  Following lower endoscopy (colonoscopy or flexible sigmoidoscopy):  Excessive amounts of blood in the stool  Significant tenderness or worsening of abdominal pains  Swelling of the abdomen that is new, acute  Fever of 100F or higher  For urgent or emergent issues, a gastroenterologist can be reached at any hour by calling [phone]. Do not use MyChart messaging  for urgent concerns.    DIET:  We do recommend a small meal at first, but then you may proceed to your regular diet.  Drink plenty of fluids but you should avoid alcoholic beverages for 24 hours.  ACTIVITY:  You should plan to take it easy for the rest of today and you should NOT DRIVE or use heavy machinery until tomorrow (because of the sedation medicines used during the test).    FOLLOW UP: Our staff will call the number listed on your records the next business day following your procedure.  We will call around 7:15- 8:00 am to check on you and address any questions or concerns that you may have regarding the information given to you following your procedure. If we do not reach you, we will leave a message.     If any biopsies were taken you will be contacted by phone or by letter within the next 1-3 weeks.  Please call us  at [phone] if you have not heard about the biopsies in 3 weeks.    SIGNATURES/CONFIDENTIALITY: You and/or your care partner have signed paperwork which will be entered into your electronic medical record.  These signatures attest to the fact that that the information above on your After Visit Summary has been reviewed and is understood.  Full responsibility of the confidentiality of this discharge information lies with you and/or your care-partner.

## 2023-06-09 NOTE — Progress Notes (Signed)
 Called to room to assist during endoscopic procedure.  Patient ID and intended procedure confirmed with present staff. Received instructions for my participation in the procedure from the performing physician.

## 2023-06-09 NOTE — Op Note (Signed)
  [facility] Patient Name: Krista Sosa Procedure Date: 06/09/2023 1:18 PM MRN: [id number] Endoscopist: Lupita FORBES Commander , MD, [id number] Age: 73 Referring MD:  Date of Birth: Apr 16, 1951 Gender: Female Account #: [id number] Procedure:                Colonoscopy Indications:              Surveillance: Personal history of adenomatous                            polyps on last colonoscopy > 5 years ago, Last                            colonoscopy: 2017 Medicines:                Monitored Anesthesia Care Procedure:                Pre-Anesthesia Assessment:                           - Prior to the procedure, a History and Physical                            was performed, and patient medications and                            allergies were reviewed. The patient's tolerance of                            previous anesthesia was also reviewed. The risks                            and benefits of the procedure and the sedation                            options and risks were discussed with the patient.                            All questions were answered, and informed consent                            was obtained. Prior Anticoagulants: The patient has                            taken no anticoagulant or antiplatelet agents. ASA                            Grade Assessment: II - A patient with mild systemic                            disease. After reviewing the risks and benefits,                            the patient was deemed in satisfactory condition to  undergo the procedure.                           After obtaining informed consent, the colonoscope                            was passed under direct vision. Throughout the                            procedure, the patient's blood pressure, pulse, and                            oxygen saturations were monitored continuously. The                            Olympus Scope SN [id number] was introduced  through the                            anus and advanced to the the cecum, identified by                            appendiceal orifice and ileocecal valve. The                            colonoscopy was performed without difficulty. The                            patient tolerated the procedure well. The quality                            of the bowel preparation was adequate. The                            ileocecal valve, appendiceal orifice, and rectum                            were photographed. The bowel preparation used was                            Miralax via split dose instruction. Scope In: 1:29:44 PM Scope Out: 1:47:40 PM Scope Withdrawal Time: 0 hours 13 minutes 39 seconds  Total Procedure Duration: 0 hours 17 minutes 56 seconds  Findings:                 The perianal and digital rectal examinations were                            normal.                           Two sessile polyps were found in the distal                            transverse colon. The polyps were diminutive in  size. These polyps were removed with a cold snare.                            Resection and retrieval were complete. Verification                            of patient identification for the specimen was                            done. Estimated blood loss was minimal.                           Scattered small-mouthed diverticula were found in                            the sigmoid colon, descending colon and transverse                            colon.                           The exam was otherwise without abnormality on                            direct and retroflexion views. Complications:            No immediate complications. Estimated Blood Loss:     Estimated blood loss was minimal. Impression:               - Two diminutive polyps in the distal transverse                            colon, removed with a cold snare. Resected and                             retrieved.                           - Diverticulosis in the sigmoid colon, in the                            descending colon and in the transverse colon.                           - The examination was otherwise normal on direct                            and retroflexion views.                           - Personal history of colonic polyps. 5 mm adenoma                            2010 and 3+6 mm adenomas 2017 Recommendation:           - Patient has a contact number available for  emergencies. The signs and symptoms of potential                            delayed complications were discussed with the                            patient. Return to normal activities tomorrow.                            Written discharge instructions were provided to the                            patient.                           - Resume previous diet.                           - Continue present medications.                           - Await pathology results.                           - Repeat colonoscopy in 5 years. (lavage required                            to obtain adequate prep) Lupita FORBES Commander, MD 06/09/2023 1:56:37 PM This report has been signed electronically.

## 2023-06-10 ENCOUNTER — Telehealth: Payer: Self-pay

## 2023-06-10 NOTE — Telephone Encounter (Signed)
  Follow up Call-     06/09/2023   12:46 PM 08/06/2022    2:16 PM  Call back number  Post procedure Call Back phone  # [phone]  Permission to leave phone message Yes Yes     Patient questions:  Do you have a fever, pain , or abdominal swelling? No. Pain Score  0 *  Have you tolerated food without any problems? Yes.    Have you been able to return to your normal activities? Yes.    Do you have any questions about your discharge instructions: Diet   No. Medications  No. Follow up visit  No.  Do you have questions or concerns about your Care? No.  Actions: * If pain score is 4 or above: No action needed, pain <4.

## 2023-06-12 LAB — SURGICAL PATHOLOGY

## 2023-06-16 ENCOUNTER — Encounter: Payer: Self-pay | Admitting: Internal Medicine

## 2023-06-16 DIAGNOSIS — Z860101 Personal history of adenomatous and serrated colon polyps: Secondary | ICD-10-CM

## 2023-06-24 ENCOUNTER — Other Ambulatory Visit: Payer: Self-pay | Admitting: Student

## 2023-06-24 ENCOUNTER — Other Ambulatory Visit: Payer: Self-pay | Admitting: Physician Assistant

## 2023-06-24 DIAGNOSIS — M792 Neuralgia and neuritis, unspecified: Secondary | ICD-10-CM

## 2023-06-24 DIAGNOSIS — G8929 Other chronic pain: Secondary | ICD-10-CM

## 2023-07-04 ENCOUNTER — Other Ambulatory Visit: Payer: Self-pay | Admitting: Student

## 2023-07-04 DIAGNOSIS — E1129 Type 2 diabetes mellitus with other diabetic kidney complication: Secondary | ICD-10-CM

## 2023-07-06 ENCOUNTER — Encounter: Payer: 59 | Admitting: Student

## 2023-07-06 NOTE — Telephone Encounter (Signed)
 Medication sent to pharmacy

## 2023-07-06 NOTE — Progress Notes (Deleted)
 CC: follow up on T2DM, HTN.   HPI:  Krista Sosa is a 73 y.o. female living with a history stated below and presents today for follow up on chronic medical conditions. She was last seen on 07/2022, and says has been unable to follow up due to ***   Please see problem based assessment and plan for additional details.  Past Medical History:  Diagnosis Date   Allergy    Asthma, chronic 06/09/2006   Bilateral cataracts 05/07/2016   s/p left cataract extraction 10/02/2016 and right cataract extraction 10/16/2016   Bronchiectasis without complication (HCC) 05/13/2018   Mild left lower lobe bronchiectasis seen on CT scan of the thorax in December 2019   Cataract    bil eyes   Essential hypertension 02/12/2018   Gastroesophageal reflux disease 08/13/2011   Glaucoma    Per patient report   Hx of adenomatous colonic polyps 11/16/2008   Hypertension    pt said she is supposed to take med but she is not taking it.   Hypertensive retinopathy, grade 2, bilateral 05/07/2016   Inflamed epidermoid cyst of skin 07/02/2020   Latent tuberculosis    dx 03/17/08 - tx with INH X 9 months   Lichenoid dermatitis 02/13/2012   Previously treated with Triamcinolone  0.025% cream Q8-12H PRN    Migraine headache 08/06/2018   Osteoarthritis, generalized 05/21/2006   Diffuse: cervical spine, right shoulder, left wrist Ineffective medications: Tylenol , ASA, Naprosyn , Ibuprofen , Meloxicam      Overweight (BMI 25.0-29.9) 02/13/2012   Perennial allergic rhinitis with seasonal variation 06/09/2006   Persistent microalbuminuria associated with type II diabetes mellitus (HCC) 05/21/2006   Radiculopathy affecting upper extremity 01/02/2019   Sickle cell anemia (HCC)    pt has sickle cell trait per pt   Sickle cell trait (HCC) 02/13/2012   Smoker    1/2ppd   Split ear lobe 01/04/2019   Tobacco abuse disorder 06/09/2006   Tubular adenoma of rectum 11/16/2008   5 mm, excised endoscopically 11/16/2008, repeat colonoscopy  due July 2015    Ulnar neuropathy of right upper extremity 02/14/2016   Urinary tract disease    JICP   Urticaria     Current Outpatient Medications on File Prior to Visit  Medication Sig Dispense Refill   albuterol  (VENTOLIN  HFA) 108 (90 Base) MCG/ACT inhaler Inhale 1-2 puffs into the lungs every 4 (four) hours as needed for wheezing or shortness of breath (coughing fits). Use with spacer. 1 each 1   atorvastatin  (LIPITOR) 80 MG tablet TAKE 1 TABLET BY MOUTH DAILY 90 tablet 3   clotrimazole -betamethasone  (LOTRISONE ) cream Apply 1 Application topically 2 (two) times daily. 30 g 0   Continuous Glucose Sensor (FREESTYLE LIBRE 2 SENSOR) MISC Please use to monitor glucose 5 each 3   diclofenac  Sodium (VOLTAREN ) 1 % GEL Apply 4 g topically 4 (four) times daily. 150 g 2   DULoxetine  (CYMBALTA ) 30 MG capsule TAKE 2 CAPSULES BY MOUTH DAILY 90 capsule 7   insulin  degludec (TRESIBA  FLEXTOUCH) 100 UNIT/ML FlexTouch Pen Inject 20 Units into the skin at bedtime. 15 mL 3   insulin  lispro (HUMALOG  KWIKPEN) 100 UNIT/ML KwikPen INJECT SUBCUTANEOUSLY 12 UNITS  IN THE MORNING AND 15 UNITS IN  THE EVENING WITH LARGEST MEALS 30 mL 2   Insulin  Pen Needle (BD PEN NEEDLE NANO 2ND GEN) 32G X 4 MM MISC USE TO INJECT INSULIN  3 TIMES DAILY 300 each 3   methocarbamol (ROBAXIN) 500 MG tablet Take 500 mg by mouth every 8 (  eight) hours as needed.     olmesartan -hydrochlorothiazide  (BENICAR  HCT) 20-12.5 MG tablet Take 1 tablet by mouth daily. 90 tablet 2   omeprazole  (PRILOSEC) 40 MG capsule TAKE 1 CAPSULE BY MOUTH DAILY 100 capsule 2   Skin Protectants, Misc. (EUCERIN) cream Apply topically as needed for dry skin. 454 g 1   SUMAtriptan  (IMITREX ) 50 MG tablet Take 1 tablet (50 mg total) by mouth every 2 (two) hours as needed for migraine. May repeat in 2 hours if headache persists or recurs. 30 tablet 1   SYNJARDY  12.09-998 MG TABS TAKE 1 TABLET BY MOUTH TWICE  DAILY 200 tablet 2   TRELEGY ELLIPTA  200-62.5-25 MCG/ACT AEPB  INHALE 1 INHALATION BY MOUTH  DAILY 60 each 11   triamcinolone  (KENALOG ) 0.025 % ointment APPLY TOPICALLY TWICE DAILY AS DIRECTED 30 g 3   [DISCONTINUED] Triamcinolone  Acetonide (TRIAMCINOLONE  0.1 % CREAM : EUCERIN) CREA Apply 1 application topically 2 (two) times daily as needed. 454 each 3   No current facility-administered medications on file prior to visit.      Review of Systems: ROS negative except for what is noted on the assessment and plan.  There were no vitals filed for this visit.  Physical Exam: Constitutional: well-appearing ***  in no acute distress HENT: normocephalic atraumatic, mucous membranes moist Eyes: conjunctiva non-erythematous Neck: supple Cardiovascular: regular rate and rhythm, no m/r/g Pulmonary/Chest: normal work of breathing on room air, lungs clear to auscultation bilaterally Abdominal: soft, non-tender, non-distended MSK: normal bulk and tone Neurological: alert & oriented x 3, 5/5 strength in bilateral upper and lower extremities, normal gait Skin: warm and dry Psych: ***  Assessment & Plan:   No problem-specific Assessment & Plan notes found for this encounter.  Uncontrolled type 2 diabetes mellitus with hyperglycemia, with long-term current use of insulin  (HCC) Assessment:  -Confirm the regimen  Diabetes follow up today. Last A1c of 8.9%. Current regimen of tresiba  25 U in the morning, 15 U humalog  in the morning, and 12 U of novolog  with lunch. She also takes synjardy  12.09-998 mg BID. She endorses frequent episodse of hypoglycemia during the weak. She eats a smaller breakfast and her largest meal is in the evening with dinner. We will adjust her regimen as follows   10 U of humalog  with lung, 15 U with dinner and 20 U of tresiba  prior QHS   Plan: - Continue humalog  and tresiba  as per above, if persistent hypoglycemic episodes she is instructed to call the clinic for further adjustments -Continue Synjardy  12.09-998 mg twice  daily -Patient denied wanting to follow-up with Abe Abed Plyler diabetes coordinator in our clinic -Repeat A1c in 2 months -She was given number for prior eye doctor   Essential hypertension Assessment: Current regimen of olmesartan -HCTZ 20-12.5 mg daily. BP 138/58. Not at goal but with lower diastolic will wait to increase or add additional medications such as amlodipien.    Plan: -Continue olmesartan  HCTZ 20-12.5 daily - BMP     Migraine headache Assessment: Has frequent episodes of migraine headaches.  She uses Goody powders for this frequently.  We discussed discontinuing Goody powders as they can lead to worsening of her chronic gastritis.  Will trial on sumatriptan  as needed.  If persistent migraines can consider suppressive medication therapy.   Plan: -Trial sumatriptan  50 mg as needed -Follow-up migraine symptoms and if persistent consider suppressive therapy   Mild intermittent asthma Assessment: Inhalers refilled today.  Has followed with allergy in the past. Also recommended over-the-counter allergy medicine such  as Claritin which she has used in the past   Plan: -Continue albuterol  as needed and Trelegy -Claritin as needed for allergies   Contact dermatitis Assessment: Endorses upper extremity rash for the past few months that is itch. She states rash periodically develops on her upper extremities or chest wall. They are pruritic in nature and scab over after she itches them. She has no new lesions on exam but has scarred over lesions of her arms and chest wall. Denies lesions in-between her fingers. Denies the rash having any clear drainage or pustules. No one else in her household has any rashes.    Non-specific on exam but possibly contact dermatitis she notes changing detergents in last few months. She will call to schedule an appointment for an acute lesion we can biopsy   Plan: - follow up recurrence once she changes detergents - if acute lesion try to biopsy     Patient {GC/GE:[id number]::"discussed with","seen with"} Dr. {[id number]::"NFAOZHYQ","M. Hoffman","Mullen","Narendra","Vincent","Guilloud","Lau","Machen"}  Marni Sins, MD [facility] Internal Medicine, PGY-1 Pager: [phone] Date 07/06/2023 Time 12:32 PM

## 2023-07-21 ENCOUNTER — Other Ambulatory Visit: Payer: Self-pay | Admitting: Student

## 2023-07-21 DIAGNOSIS — I10 Essential (primary) hypertension: Secondary | ICD-10-CM

## 2023-07-22 NOTE — Telephone Encounter (Signed)
 Medication sent to pharmacy

## 2023-07-23 ENCOUNTER — Other Ambulatory Visit: Payer: Self-pay | Admitting: Student

## 2023-07-23 DIAGNOSIS — Z1231 Encounter for screening mammogram for malignant neoplasm of breast: Secondary | ICD-10-CM

## 2023-07-29 ENCOUNTER — Encounter: Payer: Self-pay | Admitting: Podiatry

## 2023-07-29 ENCOUNTER — Ambulatory Visit (INDEPENDENT_AMBULATORY_CARE_PROVIDER_SITE_OTHER): Payer: 59 | Admitting: Podiatry

## 2023-07-29 ENCOUNTER — Ambulatory Visit: Payer: 59

## 2023-07-29 VITALS — Ht 66.0 in | Wt 180.0 lb

## 2023-07-29 DIAGNOSIS — E1165 Type 2 diabetes mellitus with hyperglycemia: Secondary | ICD-10-CM

## 2023-07-29 DIAGNOSIS — Z0000 Encounter for general adult medical examination without abnormal findings: Secondary | ICD-10-CM

## 2023-07-29 DIAGNOSIS — M79675 Pain in left toe(s): Secondary | ICD-10-CM | POA: Diagnosis not present

## 2023-07-29 DIAGNOSIS — M79674 Pain in right toe(s): Secondary | ICD-10-CM

## 2023-07-29 DIAGNOSIS — B351 Tinea unguium: Secondary | ICD-10-CM

## 2023-07-29 DIAGNOSIS — Z794 Long term (current) use of insulin: Secondary | ICD-10-CM

## 2023-07-29 NOTE — Progress Notes (Signed)
 This patient returns to my office for at risk foot care.  This patient requires this care by a professional since this patient will be at risk due to having diabetes and amputation big toes  B/L.  This patient is unable to cut nails herself since the patient cannot reach her nails.These nails are painful walking and wearing shoes.  This patient presents for at risk foot care today.  General Appearance  Alert, conversant and in no acute stress.  Vascular  Dorsalis pedis and posterior tibial  pulses are palpable  bilaterally.  Capillary return is within normal limits  bilaterally. Temperature is within normal limits  bilaterally.  Neurologic  Senn-Weinstein monofilament wire test within normal limits  bilaterally. Muscle power within normal limits bilaterally.  Nails Thick disfigured discolored nails with subungual debris  from hallux to fifth toes bilaterally. No evidence of bacterial infection or drainage bilaterally.  Orthopedic  No limitations of motion  feet .  No crepitus or effusions noted.  No bony pathology or digital deformities noted. Amputation distal aspect hallux  B/L.  Skin  normotropic skin with no porokeratosis noted bilaterally.  No signs of infections or ulcers noted.     Onychomycosis  Pain in right toes  Pain in left toes  Consent was obtained for treatment procedures.   Mechanical debridement of nails 2-5  bilaterally performed with a nail nipper.  Filed with dremel without incident.    Return office visit    4 months                  Told patient to return for periodic foot care and evaluation due to potential at risk complications.   Helane Gunther DPM

## 2023-07-29 NOTE — Progress Notes (Signed)
 Subjective:   ALIAH ERIKSSON is a 73 y.o. who presents for a Medicare Wellness preventive visit.  Visit Complete: Virtual I connected with  Mariel Sleet on 07/29/23 by a audio enabled telemedicine application and verified that I am speaking with the correct person using two identifiers.  Patient Location: Home  Provider Location: Office/Clinic  I discussed the limitations of evaluation and management by telemedicine. The patient expressed understanding and agreed to proceed.  Vital Signs: Because this visit was a virtual/telehealth visit, some criteria may be missing or patient reported. Any vitals not documented were not able to be obtained and vitals that have been documented are patient reported.  VideoDeclined- This patient declined Librarian, academic. Therefore the visit was completed with audio only.  AWV Questionnaire: No: Patient Medicare AWV questionnaire was not completed prior to this visit.  Cardiac Risk Factors include: advanced age (>34men, >61 women);diabetes mellitus     Objective:    Today's Vitals   07/29/23 1306  Weight: 180 lb (81.6 kg)  Height: 5\' 6"  (1.676 m)  PainSc: 0-No pain   Body mass index is 29.05 kg/m.     07/29/2023    1:08 PM 09/18/2022    3:57 PM 07/14/2022    1:08 PM 07/14/2022    9:36 AM 03/04/2022    2:48 PM 11/11/2021    2:45 PM 08/06/2021    3:06 PM  Advanced Directives  Does Patient Have a Medical Advance Directive? No No No No No No No  Would patient like information on creating a medical advance directive? No - Patient declined No - Patient declined No - Patient declined No - Patient declined No - Patient declined No - Patient declined No - Patient declined    Current Medications (verified) Outpatient Encounter Medications as of 07/29/2023  Medication Sig   albuterol (VENTOLIN HFA) 108 (90 Base) MCG/ACT inhaler Inhale 1-2 puffs into the lungs every 4 (four) hours as needed for wheezing or shortness  of breath (coughing fits). Use with spacer.   atorvastatin (LIPITOR) 80 MG tablet TAKE 1 TABLET BY MOUTH DAILY   clotrimazole-betamethasone (LOTRISONE) cream Apply 1 Application topically 2 (two) times daily.   Continuous Glucose Sensor (FREESTYLE LIBRE 2 SENSOR) MISC Please use to monitor glucose   diclofenac Sodium (VOLTAREN) 1 % GEL Apply 4 g topically 4 (four) times daily.   DULoxetine (CYMBALTA) 30 MG capsule TAKE 2 CAPSULES BY MOUTH DAILY   insulin degludec (TRESIBA FLEXTOUCH) 100 UNIT/ML FlexTouch Pen Inject 20 Units into the skin at bedtime.   insulin lispro (HUMALOG KWIKPEN) 100 UNIT/ML KwikPen INJECT SUBCUTANEOUSLY 12 UNITS  IN THE MORNING AND 15 UNITS IN  THE EVENING WITH LARGEST MEALS   Insulin Pen Needle (BD PEN NEEDLE NANO 2ND GEN) 32G X 4 MM MISC USE TO INJECT INSULIN 3 TIMES DAILY   methocarbamol (ROBAXIN) 500 MG tablet Take 500 mg by mouth every 8 (eight) hours as needed.   olmesartan-hydrochlorothiazide (BENICAR HCT) 20-12.5 MG tablet TAKE 1 TABLET BY MOUTH DAILY   omeprazole (PRILOSEC) 40 MG capsule TAKE 1 CAPSULE BY MOUTH DAILY   Skin Protectants, Misc. (EUCERIN) cream Apply topically as needed for dry skin.   SUMAtriptan (IMITREX) 50 MG tablet Take 1 tablet (50 mg total) by mouth every 2 (two) hours as needed for migraine. May repeat in 2 hours if headache persists or recurs.   SYNJARDY 12.09-998 MG TABS TAKE 1 TABLET BY MOUTH TWICE  DAILY   TRELEGY ELLIPTA 200-62.5-25 MCG/ACT AEPB  INHALE 1 INHALATION BY MOUTH  DAILY   triamcinolone (KENALOG) 0.025 % ointment APPLY TOPICALLY TWICE DAILY AS DIRECTED   [DISCONTINUED] Triamcinolone Acetonide (TRIAMCINOLONE 0.1 % CREAM : EUCERIN) CREA Apply 1 application topically 2 (two) times daily as needed.   No facility-administered encounter medications on file as of 07/29/2023.    Allergies (verified) Pineapple   History: Past Medical History:  Diagnosis Date   Allergy    Asthma, chronic 06/09/2006   Bilateral cataracts 05/07/2016    s/p left cataract extraction 10/02/2016 and right cataract extraction 10/16/2016   Bronchiectasis without complication (HCC) 05/13/2018   Mild left lower lobe bronchiectasis seen on CT scan of the thorax in December 2019   Cataract    bil eyes   Essential hypertension 02/12/2018   Gastroesophageal reflux disease 08/13/2011   Glaucoma    Per patient report   Hx of adenomatous colonic polyps 11/16/2008   Hypertension    pt said she is supposed to take med but she is not taking it.   Hypertensive retinopathy, grade 2, bilateral 05/07/2016   Inflamed epidermoid cyst of skin 07/02/2020   Latent tuberculosis    dx 03/17/08 - tx with INH X 9 months   Lichenoid dermatitis 02/13/2012   Previously treated with Triamcinolone 0.025% cream Q8-12H PRN    Migraine headache 08/06/2018   Osteoarthritis, generalized 05/21/2006   Diffuse: cervical spine, right shoulder, left wrist Ineffective medications: Tylenol, ASA, Naprosyn, Ibuprofen, Meloxicam     Overweight (BMI 25.0-29.9) 02/13/2012   Perennial allergic rhinitis with seasonal variation 06/09/2006   Persistent microalbuminuria associated with type II diabetes mellitus (HCC) 05/21/2006   Radiculopathy affecting upper extremity 01/02/2019   Sickle cell anemia (HCC)    pt has sickle cell trait per pt   Sickle cell trait (HCC) 02/13/2012   Smoker    1/2ppd   Split ear lobe 01/04/2019   Tobacco abuse disorder 06/09/2006   Tubular adenoma of rectum 11/16/2008   5 mm, excised endoscopically 11/16/2008, repeat colonoscopy due July 2015    Ulnar neuropathy of right upper extremity 02/14/2016   Urinary tract disease    JICP   Urticaria    Past Surgical History:  Procedure Laterality Date   APPENDECTOMY     CATARACT EXTRACTION, BILATERAL Bilateral    OS 10/10/2016, OD 10/16/2016   COLONOSCOPY  11/16/08   EXCISION OF KELOID N/A 09/19/2020   Procedure: EXCISION OF CHRONICALLY INFECTED UPPER BACK CYST;  Surgeon: Abigail Miyamoto, MD;  Location: [facility];  Service: General;  Laterality: N/A;   FOOT SURGERY  07/31/2008   Exostectomy right hallus   TONSILLECTOMY     TUBAL LIGATION     Family History  Problem Relation Age of Onset   Diabetes Mother    Heart disease Mother 43   Colon cancer Mother    Cancer Mother        Colon   Cancer Father        Lung   Sickle cell anemia Sister    Diabetes Sister    Brain cancer Sister    Cancer Sister        brain   Healthy Sister    Healthy Sister    Healthy Sister    Sickle cell anemia Brother    Lung cancer Brother    Cancer Brother        Lung   Breast cancer Maternal Aunt    Wilson's disease Maternal Uncle    Healthy Daughter  Healthy Daughter    Healthy Daughter    Healthy Son    Healthy Son    Breast cancer Other    Esophageal cancer Neg Hx    Rectal cancer Neg Hx    Stomach cancer Neg Hx    Colon polyps Neg Hx    Social History   Socioeconomic History   Marital status: Single    Spouse name: Not on file   Number of children: Not on file   Years of education: Not on file   Highest education level: 11th grade  Occupational History   Occupation: Retired    Comment: Dispensing optician   Occupation: Retired    Comment: [employer]  Tobacco Use   Smoking status: Every Day    Current packs/day: 0.30    Average packs/day: 0.3 packs/day for 25.0 years (7.5 ttl pk-yrs)    Types: Cigarettes   Smokeless tobacco: Never   Tobacco comments:    .3 ppd  Vaping Use   Vaping status: Never Used  Substance and Sexual Activity   Alcohol use: Yes    Alcohol/week: 0.0 standard drinks of alcohol    Comment: occasionally   Drug use: No    Comment: positive marijuana metabolite in 2010   Sexual activity: Not Currently    Birth control/protection: Other-see comments, Post-menopausal    Comment: s/p tubal ligation  Other Topics Concern   Not on file  Social History Narrative   Current Social History 09/05/2020       Patient lives alone in a ground floor apartment. There is one  step down without handrails to the entrance the patient uses.       Patient's method of transportation is personal car.      The highest level of education was 11 th grade      The patient currently retired from [employer], and the [employer].      Identified important Relationships are "Lelon Mast and Lynford Humphrey"       Pets : Chihuahua "[employer] / Fun: "Walking at the park"       Current Stressors: "I have no stress; I'm good"       Religious / Personal Beliefs: "Holiness"       L. Ducatte, BSN, RN-BC    Social Drivers of Health   Financial Resource Strain: Low Risk  (07/29/2023)   Overall Financial Resource Strain (CARDIA)    Difficulty of Paying Living Expenses: Not hard at all  Food Insecurity: Patient Declined (07/29/2023)   Hunger Vital Sign    Worried About Running Out of Food in the Last Year: Patient declined    Ran Out of Food in the Last Year: Patient declined  Transportation Needs: No Transportation Needs (07/29/2023)   PRAPARE - Administrator, Civil Service (Medical): No    Lack of Transportation (Non-Medical): No  Physical Activity: Inactive (07/29/2023)   Exercise Vital Sign    Days of Exercise per Week: 0 days    Minutes of Exercise per Session: 0 min  Stress: No Stress Concern Present (07/29/2023)   [employer] of Occupational Health - Occupational Stress Questionnaire    Feeling of Stress : Only a little  Social Connections: Unknown (07/29/2023)   Social Connection and Isolation Panel [NHANES]    Frequency of Communication with Friends and Family: More than three times a week    Frequency of Social Gatherings with Friends and Family: Patient declined  Attends Religious Services: Patient declined    Active Member of Clubs or Organizations: No    Attends Banker Meetings: Never    Marital Status: Never married    Tobacco Counseling Ready to quit: Not Answered Counseling given: Not Answered Tobacco comments:  .3 ppd    Clinical Intake:  Pre-visit preparation completed: Yes  Pain : No/denies pain Pain Score: 0-No pain     BMI - recorded: 29.05 Nutritional Status: BMI 25 -29 Overweight Nutritional Risks: None Diabetes: Yes CBG done?: No Did pt. bring in CBG monitor from home?: No  How often do you need to have someone help you when you read instructions, pamphlets, or other written materials from your doctor or pharmacy?: 1 - Never What is the last grade level you completed in school?: 11TH GRADE  Interpreter Needed?: No  Information entered by :: James Senn N. Chanika Byland, LPN.   Activities of Daily Living     07/29/2023    1:09 PM 09/18/2022    3:57 PM  In your present state of health, do you have any difficulty performing the following activities:  Hearing? 0 0  Vision? 0 0  Difficulty concentrating or making decisions? 0 1  Comment BSE: READING, PUZZLES   Walking or climbing stairs? 0 0  Dressing or bathing? 0 0  Doing errands, shopping? 0 0  Preparing Food and eating ? N   Using the Toilet? N   In the past six months, have you accidently leaked urine? Y   Comment WEARS PROTECTION   Do you have problems with loss of bowel control? Y   Managing your Medications? N   Managing your Finances? N   Housekeeping or managing your Housekeeping? N     Patient Care Team: Olegario Messier, MD as PCP - General Holli Humbles, MD as Referring Physician (Ophthalmology) Iva Boop, MD as Consulting Physician (Gastroenterology) Abigail Miyamoto, MD as Consulting Physician (General Surgery) Nehemiah Settle, FNP as Nurse Practitioner (Family Medicine) Ria Clock (Inactive) (Orthotics) Plyler, Cecil Cranker, RD as Dietitian (Dietician) Ellamae Sia, DO as Consulting Physician (Allergy)  Indicate any recent Medical Services you may have received from other than [facility] providers in the past year (date may be approximate).     Assessment:   This is a routine wellness examination for  Andilynn.  Hearing/Vision screen Hearing Screening - Comments:: Denies hearing difficulties.   Vision Screening - Comments:: Wears rx glasses - Patient not up to date with routine eye exams with Holli Humbles, MD. Patient is requesting a referral to a new ophthalmologist.    Goals Addressed             This Visit's Progress    Weight < 150 lb (68 kg)   180 lb (81.6 kg)      Depression Screen     07/29/2023    1:10 PM 09/18/2022    3:57 PM 07/14/2022    1:08 PM 03/04/2022    2:48 PM 11/11/2021    2:46 PM 08/06/2021    3:05 PM 07/10/2021   11:25 AM  PHQ 2/9 Scores  PHQ - 2 Score 0 0 0 0 0 0 0  PHQ- 9 Score 0   7  0 2    Fall Risk     07/29/2023    1:08 PM 09/18/2022    3:57 PM 07/14/2022    1:08 PM 07/14/2022    9:35 AM 03/04/2022    2:48 PM  Fall Risk   Falls in  the past year? 0 0 0 0 0  Number falls in past yr: 0 0 0 0   Injury with Fall? 0 0 0 0   Risk for fall due to : No Fall Risks No Fall Risks No Fall Risks No Fall Risks No Fall Risks  Follow up Falls prevention discussed;Falls evaluation completed Falls evaluation completed;Falls prevention discussed Falls evaluation completed;Falls prevention discussed Falls evaluation completed;Falls prevention discussed Falls evaluation completed    MEDICARE RISK AT HOME:  Medicare Risk at Home Any stairs in or around the home?: No If so, are there any without handrails?: No Home free of loose throw rugs in walkways, pet beds, electrical cords, etc?: Yes Adequate lighting in your home to reduce risk of falls?: Yes Life alert?: No Use of a cane, walker or w/c?: No Grab bars in the bathroom?: No (VERY CAREFUL) Shower chair or bench in shower?: No Elevated toilet seat or a handicapped toilet?: No  TIMED UP AND GO:  Was the test performed?  No  Cognitive Function: 6CIT completed    07/29/2023    1:09 PM  MMSE - Mini Mental State Exam  Not completed: Unable to complete        07/29/2023    1:09 PM 07/14/2022    1:08 PM   6CIT Screen  What Year? 0 points 0 points  What month? 0 points 0 points  What time? 0 points 0 points  Count back from 20 0 points 0 points  Months in reverse 0 points 0 points  Repeat phrase 0 points 0 points  Total Score 0 points 0 points    Immunizations Immunization History  Administered Date(s) Administered   Influenza Split 04/07/2011, 02/13/2012   Influenza Whole 05/04/2007, 03/14/2008, 03/06/2009   Influenza,inj,Quad PF,6+ Mos 01/20/2014, 02/14/2016, 01/29/2017, 02/12/2018, 02/21/2019, 03/26/2020, 03/04/2022   Influenza-Unspecified 03/26/2013, 02/21/2021, 02/18/2023   PFIZER(Purple Top)SARS-COV-2 Vaccination 07/29/2019, 08/19/2019, 07/17/2020   PNEUMOCOCCAL CONJUGATE-20 11/11/2021   PPD Test 07/25/2014   Pneumococcal Conjugate-13 07/28/2014   Pneumococcal Polysaccharide-23 03/06/2009, 10/19/2015   Td 03/04/2022    Screening Tests Health Maintenance  Topic Date Due   Zoster Vaccines- Shingrix (1 of 2) Never done   OPHTHALMOLOGY EXAM  04/24/2022   HEMOGLOBIN A1C  10/12/2022   COVID-19 Vaccine (4 - 2024-25 season) 01/25/2023   Diabetic kidney evaluation - eGFR measurement  03/05/2023   FOOT EXAM  03/05/2023   LIPID PANEL  03/05/2023   Diabetic kidney evaluation - Urine ACR  03/06/2027 (Originally 09/29/2019)   MAMMOGRAM  05/22/2024   Medicare Annual Wellness (AWV)  07/28/2024   Colonoscopy  06/08/2028   DTaP/Tdap/Td (2 - Tdap) 03/04/2032   Pneumonia Vaccine 62+ Years old  Completed   INFLUENZA VACCINE  Completed   DEXA SCAN  Completed   Hepatitis C Screening  Completed   HPV VACCINES  Aged Out    Health Maintenance  Health Maintenance Due  Topic Date Due   Zoster Vaccines- Shingrix (1 of 2) Never done   OPHTHALMOLOGY EXAM  04/24/2022   HEMOGLOBIN A1C  10/12/2022   COVID-19 Vaccine (4 - 2024-25 season) 01/25/2023   Diabetic kidney evaluation - eGFR measurement  03/05/2023   FOOT EXAM  03/05/2023   LIPID PANEL  03/05/2023   Health Maintenance Items  Addressed: See Nurse Notes  Additional Screening:  Vision Screening: Recommended annual ophthalmology exams for early detection of glaucoma and other disorders of the eye.  Dental Screening: Recommended annual dental exams for proper oral hygiene  Community Resource Referral /  Chronic Care Management: CRR required this visit?  No   CCM required this visit?  No     Plan:     I have personally reviewed and noted the following in the patient's chart:   Medical and social history Use of alcohol, tobacco or illicit drugs  Current medications and supplements including opioid prescriptions. Patient is not currently taking opioid prescriptions. Functional ability and status Nutritional status Physical activity Advanced directives List of other physicians Hospitalizations, surgeries, and ER visits in previous 12 months Vitals Screenings to include cognitive, depression, and falls Referrals and appointments  In addition, I have reviewed and discussed with patient certain preventive protocols, quality metrics, and best practice recommendations. A written personalized care plan for preventive services as well as general preventive health recommendations were provided to patient.     Mickeal Needy, LPN   0/07/4740   After Visit Summary: (Declined) Due to this being a telephonic visit, with patients personalized plan was offered to patient but patient Declined AVS at this time   Notes: Please refer to Routing Comments.

## 2023-07-29 NOTE — Patient Instructions (Addendum)
 [name] , Thank you for taking time to come for your Medicare Wellness Visit. I appreciate your ongoing commitment to your health goals. Please review the following plan we discussed and let me know if I can assist you in the future.   Referrals/Orders/Follow-Ups/Clinician Recommendations: Yes; Keep maintaining your health by keeping your appointments with Dr. Thomasene Ripple and any specialists that you may see.  Call us if you need anything.  Have a great year!!!!  This is a list of the screening recommended for you and due dates:  Health Maintenance  Topic Date Due   Zoster (Shingles) Vaccine (1 of 2) Never done   Eye exam for diabetics  04/24/2022   Hemoglobin A1C  10/12/2022   COVID-19 Vaccine (4 - 2024-25 season) 01/25/2023   Yearly kidney function blood test for diabetes  03/05/2023   Complete foot exam   03/05/2023   Lipid (cholesterol) test  03/05/2023   Yearly kidney health urinalysis for diabetes  03/06/2027*   Mammogram  05/22/2024   Medicare Annual Wellness Visit  07/28/2024   Colon Cancer Screening  06/08/2028   DTaP/Tdap/Td vaccine (2 - Tdap) 03/04/2032   Pneumonia Vaccine  Completed   Flu Shot  Completed   DEXA scan (bone density measurement)  Completed   Hepatitis C Screening  Completed   HPV Vaccine  Aged Out  *Topic was postponed. The date shown is not the original due date.    Advanced directives: (Declined) Advance directive discussed with you today. Even though you declined this today, please call our office should you change your mind, and we can give you the proper paperwork for you to fill out.  Next Medicare Annual Wellness Visit scheduled for next year: Yes

## 2023-07-30 ENCOUNTER — Ambulatory Visit: Payer: 59

## 2023-07-31 ENCOUNTER — Ambulatory Visit
Admission: RE | Admit: 2023-07-31 | Discharge: 2023-07-31 | Disposition: A | Discharge status: Home / Self Care | Source: Ambulatory Visit | Attending: Family | Admitting: Family

## 2023-07-31 DIAGNOSIS — Z1231 Encounter for screening mammogram for malignant neoplasm of breast: Secondary | ICD-10-CM

## 2023-08-03 ENCOUNTER — Other Ambulatory Visit: Payer: Self-pay | Admitting: Family

## 2023-08-03 DIAGNOSIS — N644 Mastodynia: Secondary | ICD-10-CM

## 2023-08-06 ENCOUNTER — Other Ambulatory Visit: Payer: Self-pay | Admitting: Student

## 2023-08-09 ENCOUNTER — Other Ambulatory Visit: Payer: Self-pay | Admitting: Student

## 2023-08-09 DIAGNOSIS — E1129 Type 2 diabetes mellitus with other diabetic kidney complication: Secondary | ICD-10-CM

## 2023-08-12 ENCOUNTER — Ambulatory Visit: Payer: Self-pay

## 2023-08-12 NOTE — Telephone Encounter (Signed)
 Will forward to Dr. Thomasene Ripple and front office to schedule appointment.

## 2023-08-12 NOTE — Telephone Encounter (Signed)
 I talked to pt who stated she only wants to see her doctor. Informed pt his next available appt next week - she agreed to wait until [facility] 3/26 @ 0915Am. Instructed pt to go to UC if her pain/sob worsen.

## 2023-08-12 NOTE — Telephone Encounter (Signed)
  Chief Complaint: side pain Symptoms: side pain, shortness of breath intermittently Frequency: ongoing intermittently for 3 weeks Pertinent Negatives: Patient denies chest pain Disposition: [] ED /[] Urgent Care (no appt availability in office) / [x] Appointment(In office/virtual)/ []  [facility]/ [] Home Care/ [x] Refused Recommended Disposition /[] [facility]/ []  Follow-up with PCP Additional Notes:  Someone gave her a tight hug about 3 weeks ago, she developed right sided flank pain and shortness of breath that was improving but now feels the same as when it first happened. She is having trouble sleeping due to the pain. She has not been evaluated for this and was sent to triage after mentioning this pain while trying to schedule her mammogram. No available acute visit with PCP, she declines other providers and will only see Dr. Thomasene Ripple. Please follow up with Kathie Rhodes regarding need for acute visit.    Copied from CRM [id number]. Topic: Clinical - Red Word Triage >> Aug 12, 2023  3:19 PM Brittney F wrote: [facility] that prompted transfer to Nurse Triage: Patient stated her side hurts; right side is still tender; causing a good deal of discomfort and it effects the way she breathes at night when she is attempting to rest.  Cause: Someone hugged her too tight; Pt denied scheduling for her mammogram due to wanting her to see her PCP Reason for Disposition  MODERATE pain (e.g., interferes with normal activities or awakens from sleep)  Protocols used: Flank Pain-A-AH

## 2023-08-17 ENCOUNTER — Other Ambulatory Visit: Payer: Self-pay | Admitting: Student

## 2023-08-17 DIAGNOSIS — E1129 Type 2 diabetes mellitus with other diabetic kidney complication: Secondary | ICD-10-CM

## 2023-08-17 NOTE — Telephone Encounter (Unsigned)
 Copied from CRM [id number]. Topic: Clinical - Medication Refill >> Aug 17, 2023  2:45 PM Everette Rank wrote: Most Recent Primary Care Visit:  Provider: Mickeal Needy  Department: [facility]  Visit Type: MEDICARE AWV, SEQUENTIAL  Date: 07/29/2023  Medication:Insulin Pen Needle (BD PEN NEEDLE NANO 2ND GEN) 32G X 4 MM MISC    Has the patient contacted their pharmacy? Yes (Agent: If no, request that the patient contact the pharmacy for the refill. If patient does not wish to contact the pharmacy document the reason why and proceed with request.) (Agent: If yes, when and what did the pharmacy advise?)  Is this the correct pharmacy for this prescription? Yes If no, delete pharmacy and type the correct one.  This is the patient's preferred pharmacy:  [id number] [place], [place] - [address] [place] [address] Phone: [phone] Fax: [phone]  [facility] - [place], [place] - [address] [place] [address] Phone: [phone] Fax: [phone]   Has the prescription been filled recently? No  Is the patient out of the medication? Yes  Has the patient been seen for an appointment in the last year OR does the patient have an upcoming appointment? Yes  Can we respond through MyChart? No  Agent: Please be advised that Rx refills may take up to 3 business days. We ask that you follow-up with your pharmacy.

## 2023-08-18 ENCOUNTER — Other Ambulatory Visit: Payer: Self-pay | Admitting: Student

## 2023-08-18 MED ORDER — BD PEN NEEDLE NANO 2ND GEN 32G X 4 MM MISC: 3 refills | Status: DC

## 2023-08-18 NOTE — Telephone Encounter (Signed)
 Medication sent to pharmacy

## 2023-08-19 ENCOUNTER — Encounter: Admitting: Student

## 2023-08-20 ENCOUNTER — Ambulatory Visit (INDEPENDENT_AMBULATORY_CARE_PROVIDER_SITE_OTHER): Admitting: Student

## 2023-08-20 ENCOUNTER — Encounter: Admitting: Student

## 2023-08-20 VITALS — BP 119/58 | HR 97 | Temp 98.4°F | Ht 66.0 in | Wt 180.8 lb

## 2023-08-20 DIAGNOSIS — E1165 Type 2 diabetes mellitus with hyperglycemia: Secondary | ICD-10-CM

## 2023-08-20 DIAGNOSIS — I10 Essential (primary) hypertension: Secondary | ICD-10-CM

## 2023-08-20 DIAGNOSIS — M5412 Radiculopathy, cervical region: Secondary | ICD-10-CM | POA: Diagnosis not present

## 2023-08-20 DIAGNOSIS — Z794 Long term (current) use of insulin: Secondary | ICD-10-CM | POA: Diagnosis not present

## 2023-08-20 DIAGNOSIS — E1129 Type 2 diabetes mellitus with other diabetic kidney complication: Secondary | ICD-10-CM

## 2023-08-20 LAB — GLUCOSE, CAPILLARY: Glucose-Capillary: 305 mg/dL — ABNORMAL HIGH (ref 70–99)

## 2023-08-20 LAB — POCT GLYCOSYLATED HEMOGLOBIN (HGB A1C): Hemoglobin A1C: 9 % — AB (ref 4.0–5.6)

## 2023-08-20 MED ORDER — FREESTYLE LIBRE 2 SENSOR MISC: 3 refills | Status: DC

## 2023-08-20 MED ORDER — NAPROXEN 500 MG PO TABS: 500.0000 mg | ORAL_TABLET | Freq: Two times a day (BID) | ORAL | 0 refills | Status: AC

## 2023-08-20 NOTE — Assessment & Plan Note (Signed)
 Blood pressure  today, 119/58.  Diastolic seems to be on the lower side, but appears chronic.  Will continue olmesartan hydrochlorothiazide 20-12.5, and check BMP today as well.

## 2023-08-20 NOTE — Assessment & Plan Note (Signed)
 Patient states that for the last 2 days she has had left arm pain starting in her neck and shooting down her arm.  She denies any trauma or falls to the area.  She denies any numbness or tingling.  There is significant tenderness of the left cervical area, and Spurling's test is positive.  I think she would benefit from anti-inflammatory therapy, she is already on duloxetine for depression however she is unsure that she is taking it.  Advised her to bring her medications in with her at her next visit.  In the meanwhile, I put in a referral to physical therapy, and will trial a short course of naproxen 500 mg twice a day.  Can consider imaging if problem does not resolve with physical therapy.

## 2023-08-20 NOTE — Progress Notes (Signed)
 CC: Diabetes follow-up  HPI:  Krista Sosa is a 73 y.o. female living with a history stated below and presents today for diabetes follow-up. Please see problem based assessment and plan for additional details.  Past Medical History:  Diagnosis Date   Allergy    Asthma, chronic 06/09/2006   Bilateral cataracts 05/07/2016   s/p left cataract extraction 10/02/2016 and right cataract extraction 10/16/2016   Bronchiectasis without complication (HCC) 05/13/2018   Mild left lower lobe bronchiectasis seen on CT scan of the thorax in December 2019   Cataract    bil eyes   Essential hypertension 02/12/2018   Gastroesophageal reflux disease 08/13/2011   Glaucoma    Per patient report   Hx of adenomatous colonic polyps 11/16/2008   Hypertension    pt said she is supposed to take med but she is not taking it.   Hypertensive retinopathy, grade 2, bilateral 05/07/2016   Inflamed epidermoid cyst of skin 07/02/2020   Latent tuberculosis    dx 03/17/08 - tx with INH X 9 months   Lichenoid dermatitis 02/13/2012   Previously treated with Triamcinolone 0.025% cream Q8-12H PRN    Migraine headache 08/06/2018   Osteoarthritis, generalized 05/21/2006   Diffuse: cervical spine, right shoulder, left wrist Ineffective medications: Tylenol, ASA, Naprosyn, Ibuprofen, Meloxicam     Overweight (BMI 25.0-29.9) 02/13/2012   Perennial allergic rhinitis with seasonal variation 06/09/2006   Persistent microalbuminuria associated with type II diabetes mellitus (HCC) 05/21/2006   Radiculopathy affecting upper extremity 01/02/2019   Sickle cell anemia (HCC)    pt has sickle cell trait per pt   Sickle cell trait (HCC) 02/13/2012   Smoker    1/2ppd   Split ear lobe 01/04/2019   Tobacco abuse disorder 06/09/2006   Tubular adenoma of rectum 11/16/2008   5 mm, excised endoscopically 11/16/2008, repeat colonoscopy due July 2015    Ulnar neuropathy of right upper extremity 02/14/2016   Urinary tract disease    JICP    Urticaria     Current Outpatient Medications on File Prior to Visit  Medication Sig Dispense Refill   albuterol (VENTOLIN HFA) 108 (90 Base) MCG/ACT inhaler Inhale 1-2 puffs into the lungs every 4 (four) hours as needed for wheezing or shortness of breath (coughing fits). Use with spacer. 1 each 1   atorvastatin (LIPITOR) 80 MG tablet TAKE 1 TABLET BY MOUTH DAILY 90 tablet 3   clotrimazole-betamethasone (LOTRISONE) cream APPLY TOPICALLY TO AFFECTED  AREA(S) TWICE DAILY 45 g 2   diclofenac Sodium (VOLTAREN) 1 % GEL Apply 4 g topically 4 (four) times daily. 150 g 2   DULoxetine (CYMBALTA) 30 MG capsule TAKE 2 CAPSULES BY MOUTH DAILY 90 capsule 7   insulin degludec (TRESIBA FLEXTOUCH) 100 UNIT/ML FlexTouch Pen INJECT 25 UNITS INTO THE SKIN DAILY. 3 mL 3   insulin lispro (HUMALOG KWIKPEN) 100 UNIT/ML KwikPen INJECT SUBCUTANEOUSLY 12 UNITS  IN THE MORNING AND 15 UNITS IN  THE EVENING WITH LARGEST MEALS 30 mL 2   Insulin Pen Needle (BD PEN NEEDLE NANO 2ND GEN) 32G X 4 MM MISC USE TO INJECT INSULIN 3 TIMES DAILY 300 each 3   methocarbamol (ROBAXIN) 500 MG tablet Take 500 mg by mouth every 8 (eight) hours as needed.     olmesartan-hydrochlorothiazide (BENICAR HCT) 20-12.5 MG tablet TAKE 1 TABLET BY MOUTH DAILY 100 tablet 2   omeprazole (PRILOSEC) 40 MG capsule TAKE 1 CAPSULE BY MOUTH DAILY 100 capsule 2   Skin Protectants, Misc. (EUCERIN) cream  Apply topically as needed for dry skin. 454 g 1   SUMAtriptan (IMITREX) 50 MG tablet Take 1 tablet (50 mg total) by mouth every 2 (two) hours as needed for migraine. May repeat in 2 hours if headache persists or recurs. 30 tablet 1   SYNJARDY 12.09-998 MG TABS TAKE 1 TABLET BY MOUTH TWICE  DAILY 200 tablet 2   TRELEGY ELLIPTA 200-62.5-25 MCG/ACT AEPB USE 1 INHALATION BY MOUTH DAILY 180 each 3   triamcinolone (KENALOG) 0.025 % ointment APPLY TOPICALLY TWICE DAILY AS DIRECTED 30 g 3   [DISCONTINUED] Triamcinolone Acetonide (TRIAMCINOLONE 0.1 % CREAM : EUCERIN) CREA  Apply 1 application topically 2 (two) times daily as needed. 454 each 3   No current facility-administered medications on file prior to visit.    Family History  Problem Relation Age of Onset   Diabetes Mother    Heart disease Mother 46   Colon cancer Mother    Cancer Mother        Colon   Cancer Father        Lung   Sickle cell anemia Sister    Diabetes Sister    Brain cancer Sister    Cancer Sister        brain   Healthy Sister    Healthy Sister    Healthy Sister    Sickle cell anemia Brother    Lung cancer Brother    Cancer Brother        Lung   Breast cancer Maternal Aunt    Wilson's disease Maternal Uncle    Healthy Daughter    Healthy Daughter    Healthy Daughter    Healthy Son    Healthy Son    Breast cancer Other    Esophageal cancer Neg Hx    Rectal cancer Neg Hx    Stomach cancer Neg Hx    Colon polyps Neg Hx     Social History   Socioeconomic History   Marital status: Single    Spouse name: Not on file   Number of children: Not on file   Years of education: Not on file   Highest education level: 11th grade  Occupational History   Occupation: Retired    Comment: Dispensing optician   Occupation: Retired    Comment: [employer]  Tobacco Use   Smoking status: Every Day    Current packs/day: 0.30    Average packs/day: 0.3 packs/day for 25.0 years (7.5 ttl pk-yrs)    Types: Cigarettes   Smokeless tobacco: Never   Tobacco comments:    .3 ppd  Vaping Use   Vaping status: Never Used  Substance and Sexual Activity   Alcohol use: Yes    Alcohol/week: 0.0 standard drinks of alcohol    Comment: occasionally   Drug use: No    Comment: positive marijuana metabolite in 2010   Sexual activity: Not Currently    Birth control/protection: Other-see comments, Post-menopausal    Comment: s/p tubal ligation  Other Topics Concern   Not on file  Social History Narrative   Current Social History 09/05/2020       Patient lives alone in a ground floor apartment. There  is one step down without handrails to the entrance the patient uses.       Patient's method of transportation is personal car.      The highest level of education was 11 th grade      The patient currently retired from [employer], and the [employer]      Identified important Relationships are "Lelon Mast and Lynford Humphrey"       Pets : Chihuahua "[employer] / Fun: "Walking at the park"       Current Stressors: "I have no stress; I'm good"       Religious / Personal Beliefs: "Holiness"       L. Ducatte, BSN, RN-BC    Social Drivers of Health   Financial Resource Strain: Low Risk  (07/29/2023)   Overall Financial Resource Strain (CARDIA)    Difficulty of Paying Living Expenses: Not hard at all  Food Insecurity: Patient Declined (07/29/2023)   Hunger Vital Sign    Worried About Running Out of Food in the Last Year: Patient declined    Ran Out of Food in the Last Year: Patient declined  Transportation Needs: No Transportation Needs (07/29/2023)   PRAPARE - Administrator, Civil Service (Medical): No    Lack of Transportation (Non-Medical): No  Physical Activity: Inactive (07/29/2023)   Exercise Vital Sign    Days of Exercise per Week: 0 days    Minutes of Exercise per Session: 0 min  Stress: No Stress Concern Present (07/29/2023)   [employer] of Occupational Health - Occupational Stress Questionnaire    Feeling of Stress : Only a little  Social Connections: Unknown (07/29/2023)   Social Connection and Isolation Panel [NHANES]    Frequency of Communication with Friends and Family: More than three times a week    Frequency of Social Gatherings with Friends and Family: Patient declined    Attends Religious Services: Patient declined    Database administrator or Organizations: No    Attends Banker Meetings: Never    Marital Status: Never married  Intimate Partner Violence: Not At Risk (07/29/2023)   Humiliation, Afraid, Rape, and Kick  questionnaire    Fear of Current or Ex-Partner: No    Emotionally Abused: No    Physically Abused: No    Sexually Abused: No    Review of Systems: ROS negative except for what is noted on the assessment and plan.  Vitals:   08/20/23 1502  BP: (!) 119/58  Pulse: 97  Temp: 98.4 F (36.9 C)  TempSrc: Oral  SpO2: 99%  Weight: 180 lb 12.8 oz (82 kg)  Height: 5\' 6"  (1.676 m)    Physical Exam: Constitutional: well-appearing female in no acute distress Cardiovascular: regular rate and rhythm, no m/r/g Pulmonary/Chest: normal work of breathing on room air, lungs clear to auscultation bilaterally Abdominal: soft, non-tender, non-distended MSK: normal bulk and tone, Spurling's test positive on the left, tenderness in cervical area on the left Neurological: alert & oriented x 3, 5/5 strength in bilateral upper and lower extremities, normal gait Skin: warm and dry   Assessment & Plan:   Uncontrolled type 2 diabetes mellitus with hyperglycemia, with long-term current use of insulin (HCC) A1c in March 2024 was 4.9, recheck today is 9.0.  Her current regimen is [place] 20 units, and 15 units of Humalog with meals.  She is compliant with this regimen, however her meter broke and she has not been able to check her sugars at home.  She's also supposed to be taking Synjardy 12.09-998 mg twice a day, which she has not been consistent with.  Unfortunately, she did not bring her medications in with her today.  At her last visit in March 2024, seems like she was having some episodes of hypoglycemia,  so I do not want to increase her insulin especially without any numbers at this moment.  Plan: - Continue Humalog 15 units with meals - Continue Tresiba 20 units nightly - Continue Synjardy 12.09-998 mg twice daily - I have sent in a meter for her - Reinforced the concept of adhering to her medical regimen, and patient states that she will be bringing in all of her medications at her next  visit  Essential hypertension Blood pressure  today, 119/58.  Diastolic seems to be on the lower side, but appears chronic.  Will continue olmesartan hydrochlorothiazide 20-12.5, and check BMP today as well.  Cervical radiculopathy Patient states that for the last 2 days she has had left arm pain starting in her neck and shooting down her arm.  She denies any trauma or falls to the area.  She denies any numbness or tingling.  There is significant tenderness of the left cervical area, and Spurling's test is positive.  I think she would benefit from anti-inflammatory therapy, she is already on duloxetine for depression however she is unsure that she is taking it.  Advised her to bring her medications in with her at her next visit.  In the meanwhile, I put in a referral to physical therapy, and will trial a short course of naproxen 500 mg twice a day.  Can consider imaging if problem does not resolve with physical therapy.   Patient discussed with Dr. Dierdre Forth Marvelle Span, M.D. [facility], PGY-2 Pager: [phone] Date 08/20/2023 Time 4:49 PM

## 2023-08-20 NOTE — Assessment & Plan Note (Addendum)
 A1c in March 2024 was 4.9, recheck today is 9.0.  Her current regimen is [place] 20 units, and 15 units of Humalog with meals.  She is compliant with this regimen, however her meter broke and she has not been able to check her sugars at home.  She's also supposed to be taking Synjardy 12.09-998 mg twice a day, which she has not been consistent with.  Unfortunately, she did not bring her medications in with her today.  At her last visit in March 2024, seems like she was having some episodes of hypoglycemia, so I do not want to increase her insulin especially without any numbers at this moment.  Plan: - Continue Humalog 15 units with meals - Continue Tresiba 20 units nightly - Continue Synjardy 12.09-998 mg twice daily - I have sent in a meter for her - Reinforced the concept of adhering to her medical regimen, and patient states that she will be bringing in all of her medications at her next visit

## 2023-08-20 NOTE — Patient Instructions (Addendum)
 Thank you so much for coming to the clinic today!   Today, we are rechecking your labs. For your pain, I am sending in a pain medication as well as a topical gel to put on that arm as well. For your next appointment, I want you to bring in all of your medications so we can go through them all and go from there. Please continue taking your medications as prescribed.   If you have any questions please feel free to the call the clinic at anytime at [phone]. It was a pleasure seeing you!  Best, Dr. Thomasene Ripple

## 2023-08-21 LAB — BMP8+ANION GAP
Anion Gap: 16 mmol/L (ref 10.0–18.0)
BUN/Creatinine Ratio: 27 (ref 12–28)
BUN: 21 mg/dL (ref 8–27)
CO2: 21 mmol/L (ref 20–29)
Calcium: 9.9 mg/dL (ref 8.7–10.3)
Chloride: 102 mmol/L (ref 96–106)
Creatinine, Ser: 0.77 mg/dL (ref 0.57–1.00)
Glucose: 170 mg/dL — ABNORMAL HIGH (ref 70–99)
Potassium: 4.3 mmol/L (ref 3.5–5.2)
Sodium: 139 mmol/L (ref 134–144)
eGFR: 83 mL/min/{1.73_m2} (ref >59)

## 2023-08-21 NOTE — Progress Notes (Signed)
 Internal Medicine Clinic Attending  Case discussed with the resident at the time of the visit.  We reviewed the resident's history and exam and pertinent patient test results.  I agree with the assessment, diagnosis, and plan of care documented in the resident's note.     Since she has had hypoglycemia on higher doses of Tresiba in the past, I agree with not increasing insulin without any concrete blood glucose data today. We focused on adherence with medications.   Dr Nooruddin to ensure patient has close follow up in 1 month to review glucometer data & adjust insulin as needed

## 2023-08-27 ENCOUNTER — Ambulatory Visit
Admission: RE | Admit: 2023-08-27 | Discharge: 2023-08-27 | Disposition: A | Discharge status: Home / Self Care | Source: Ambulatory Visit | Attending: Family | Admitting: Family

## 2023-08-27 ENCOUNTER — Ambulatory Visit

## 2023-08-27 DIAGNOSIS — N644 Mastodynia: Secondary | ICD-10-CM

## 2023-08-27 NOTE — Progress Notes (Signed)
 Please call Krista Sosa and let her know that I received her mammogram and did not order it.Please ask who her primary care physician and/or obgyn is so we can get the results to the proper provider. Thank you

## 2023-08-28 ENCOUNTER — Telehealth: Payer: Self-pay | Admitting: *Deleted

## 2023-08-28 MED ORDER — FREESTYLE LIBRE 2 READER DEVI: 1.0000 | Freq: Once | 0 refills | Status: DC

## 2023-08-28 MED ORDER — FREESTYLE LIBRE 2 READER DEVI: 1.0000 | Freq: Once | 0 refills | Status: AC

## 2023-08-28 NOTE — Addendum Note (Signed)
 Addended by: Katheran James on: 08/28/2023 02:59 PM   Modules accepted: Orders

## 2023-08-28 NOTE — Telephone Encounter (Signed)
 Copied from CRM [id number]. Topic: Clinical - Prescription Issue >> Aug 28, 2023 10:33 AM Irine Seal wrote: Reason for CRM: Patient picked up her glucose sensors Continuous Glucose Sensor (FREESTYLE LIBRE 2 SENSOR) MISC but she is requesting the corresponding glucose meter to go with them sent to the   [id number] [place], [place] - [address], [place] [address]   I advised her of the standard 3-business-day turnaround for supply/Rx requests, but she asked if this could be completed today due to the upcoming weekend and not having another way to check her glucose levels. Please advise if an order can be sent. Thank you!

## 2023-08-31 ENCOUNTER — Ambulatory Visit: Attending: Internal Medicine | Admitting: Physical Therapy

## 2023-09-22 NOTE — Telephone Encounter (Signed)
 Pt has been called but, no answer.  The patient can bring her form to the office for her provider to complete.  Copied from CRM [id number]. Topic: General - Other >> Sep 22, 2023  1:32 PM Carrielelia G wrote: Reason for CRM: Please call Pt. Ziller, she would like to know if she can drop off a document for the provider to complete for her to obtain her handicap placard. Please advise

## 2023-10-08 ENCOUNTER — Other Ambulatory Visit: Payer: 59

## 2023-10-29 ENCOUNTER — Ambulatory Visit: Admitting: Podiatry

## 2023-11-02 ENCOUNTER — Other Ambulatory Visit: Payer: Self-pay

## 2023-11-03 MED ORDER — ATORVASTATIN CALCIUM 80 MG PO TABS: 80.0000 mg | ORAL_TABLET | Freq: Every day | ORAL | 3 refills | Status: DC

## 2023-11-04 ENCOUNTER — Telehealth: Payer: Self-pay

## 2023-11-04 NOTE — Telephone Encounter (Signed)
 Patient was identified as falling into the [place] Measure - Diabetes.   Patient was: Appointment scheduled with primary care provider in the next 30 days.

## 2023-11-24 ENCOUNTER — Ambulatory Visit: Admitting: Student

## 2023-11-24 ENCOUNTER — Ambulatory Visit (HOSPITAL_COMMUNITY)
Admission: RE | Admit: 2023-11-24 | Discharge: 2023-11-24 | Disposition: A | Discharge status: Home / Self Care | Source: Ambulatory Visit | Attending: Internal Medicine | Admitting: Internal Medicine

## 2023-11-24 VITALS — BP 164/85 | HR 90 | Temp 98.8°F | Ht 66.0 in | Wt 185.0 lb

## 2023-11-24 DIAGNOSIS — Z7984 Long term (current) use of oral hypoglycemic drugs: Secondary | ICD-10-CM

## 2023-11-24 DIAGNOSIS — E049 Nontoxic goiter, unspecified: Secondary | ICD-10-CM

## 2023-11-24 DIAGNOSIS — I10 Essential (primary) hypertension: Secondary | ICD-10-CM | POA: Diagnosis not present

## 2023-11-24 DIAGNOSIS — S6992XA Unspecified injury of left wrist, hand and finger(s), initial encounter: Secondary | ICD-10-CM | POA: Diagnosis not present

## 2023-11-24 DIAGNOSIS — Z794 Long term (current) use of insulin: Secondary | ICD-10-CM | POA: Diagnosis not present

## 2023-11-24 DIAGNOSIS — M79645 Pain in left finger(s): Secondary | ICD-10-CM | POA: Insufficient documentation

## 2023-11-24 DIAGNOSIS — E1165 Type 2 diabetes mellitus with hyperglycemia: Secondary | ICD-10-CM

## 2023-11-24 DIAGNOSIS — E1129 Type 2 diabetes mellitus with other diabetic kidney complication: Secondary | ICD-10-CM

## 2023-11-24 LAB — GLUCOSE, CAPILLARY: Glucose-Capillary: 105 mg/dL — ABNORMAL HIGH (ref 70–99)

## 2023-11-24 LAB — POCT GLYCOSYLATED HEMOGLOBIN (HGB A1C): Hemoglobin A1C: 8 % — AB (ref 4.0–5.6)

## 2023-11-24 MED ORDER — FREESTYLE LIBRE 3 READER DEVI: 1 refills | Status: AC

## 2023-11-24 MED ORDER — FREESTYLE LIBRE 3 PLUS SENSOR MISC: 3 refills | Status: DC

## 2023-11-24 MED ORDER — FREESTYLE LIBRE 2 SENSOR MISC: 3 refills | Status: DC

## 2023-11-24 NOTE — Patient Instructions (Signed)
 Thank you so much for coming to the clinic today!   We are getting an xray of your thumb We are also getting an ultrasound of your thyroid   Please take the synjardy  twice a day  We are keeping everything the same in terms of your insulin   If you have any questions please feel free to the call the clinic at anytime at [phone]. It was a pleasure seeing you!  Best, Dr. Marcile Fuquay

## 2023-11-25 ENCOUNTER — Ambulatory Visit: Payer: Self-pay | Admitting: Student

## 2023-11-25 DIAGNOSIS — M79645 Pain in left finger(s): Secondary | ICD-10-CM | POA: Insufficient documentation

## 2023-11-25 LAB — TSH: TSH: 1.05 u[IU]/mL (ref 0.450–4.500)

## 2023-11-25 NOTE — Progress Notes (Signed)
 CC: HTN/T2DM follow up  HPI:  Krista Sosa is a 73 y.o. female living with a history stated below and presents today for HTN/T2DM follow up. Please see problem based assessment and plan for additional details.  Past Medical History:  Diagnosis Date   Allergy    Asthma, chronic 06/09/2006   Bilateral cataracts 05/07/2016   s/p left cataract extraction 10/02/2016 and right cataract extraction 10/16/2016   Bronchiectasis without complication (HCC) 05/13/2018   Mild left lower lobe bronchiectasis seen on CT scan of the thorax in December 2019   Cataract    bil eyes   Essential hypertension 02/12/2018   Gastroesophageal reflux disease 08/13/2011   Glaucoma    Per patient report   Hx of adenomatous colonic polyps 11/16/2008   Hypertension    pt said she is supposed to take med but she is not taking it.   Hypertensive retinopathy, grade 2, bilateral 05/07/2016   Inflamed epidermoid cyst of skin 07/02/2020   Latent tuberculosis    dx 03/17/08 - tx with INH X 9 months   Lichenoid dermatitis 02/13/2012   Previously treated with Triamcinolone  0.025% cream Q8-12H PRN    Migraine headache 08/06/2018   Osteoarthritis, generalized 05/21/2006   Diffuse: cervical spine, right shoulder, left wrist Ineffective medications: Tylenol , ASA, Naprosyn , Ibuprofen , Meloxicam      Overweight (BMI 25.0-29.9) 02/13/2012   Perennial allergic rhinitis with seasonal variation 06/09/2006   Persistent microalbuminuria associated with type II diabetes mellitus (HCC) 05/21/2006   Radiculopathy affecting upper extremity 01/02/2019   Sickle cell anemia (HCC)    pt has sickle cell trait per pt   Sickle cell trait (HCC) 02/13/2012   Smoker    1/2ppd   Split ear lobe 01/04/2019   Tobacco abuse disorder 06/09/2006   Tubular adenoma of rectum 11/16/2008   5 mm, excised endoscopically 11/16/2008, repeat colonoscopy due July 2015    Ulnar neuropathy of right upper extremity 02/14/2016   Urinary tract disease    JICP    Urticaria     Current Outpatient Medications on File Prior to Visit  Medication Sig Dispense Refill   albuterol  (VENTOLIN  HFA) 108 (90 Base) MCG/ACT inhaler Inhale 1-2 puffs into the lungs every 4 (four) hours as needed for wheezing or shortness of breath (coughing fits). Use with spacer. 1 each 1   atorvastatin  (LIPITOR) 80 MG tablet Take 1 tablet (80 mg total) by mouth daily. 90 tablet 3   clotrimazole -betamethasone  (LOTRISONE ) cream APPLY TOPICALLY TO AFFECTED  AREA(S) TWICE DAILY 45 g 2   diclofenac  Sodium (VOLTAREN ) 1 % GEL Apply 4 g topically 4 (four) times daily. 150 g 2   DULoxetine  (CYMBALTA ) 30 MG capsule TAKE 2 CAPSULES BY MOUTH DAILY 90 capsule 7   insulin  degludec (TRESIBA  FLEXTOUCH) 100 UNIT/ML FlexTouch Pen INJECT 25 UNITS INTO THE SKIN DAILY. 3 mL 3   insulin  lispro (HUMALOG  KWIKPEN) 100 UNIT/ML KwikPen INJECT SUBCUTANEOUSLY 12 UNITS  IN THE MORNING AND 15 UNITS IN  THE EVENING WITH LARGEST MEALS 30 mL 2   Insulin  Pen Needle (BD PEN NEEDLE NANO 2ND GEN) 32G X 4 MM MISC USE TO INJECT INSULIN  3 TIMES DAILY 300 each 3   methocarbamol (ROBAXIN) 500 MG tablet Take 500 mg by mouth every 8 (eight) hours as needed.     olmesartan -hydrochlorothiazide  (BENICAR  HCT) 20-12.5 MG tablet TAKE 1 TABLET BY MOUTH DAILY 100 tablet 2   Skin Protectants, Misc. (EUCERIN) cream Apply topically as needed for dry skin. 454 g 1  SYNJARDY  12.09-998 MG TABS TAKE 1 TABLET BY MOUTH TWICE  DAILY 200 tablet 2   TRELEGY ELLIPTA  200-62.5-25 MCG/ACT AEPB USE 1 INHALATION BY MOUTH DAILY 180 each 3   triamcinolone  (KENALOG ) 0.025 % ointment APPLY TOPICALLY TWICE DAILY AS DIRECTED 30 g 3   [DISCONTINUED] Triamcinolone  Acetonide (TRIAMCINOLONE  0.1 % CREAM : EUCERIN) CREA Apply 1 application topically 2 (two) times daily as needed. 454 each 3   No current facility-administered medications on file prior to visit.    Family History  Problem Relation Age of Onset   Diabetes Mother    Heart disease Mother 32    Colon cancer Mother    Cancer Mother        Colon   Cancer Father        Lung   Sickle cell anemia Sister    Diabetes Sister    Brain cancer Sister    Cancer Sister        brain   Healthy Sister    Healthy Sister    Healthy Sister    Sickle cell anemia Brother    Lung cancer Brother    Cancer Brother        Lung   Breast cancer Maternal Aunt    Wilson's disease Maternal Uncle    Healthy Daughter    Healthy Daughter    Healthy Daughter    Healthy Son    Healthy Son    Breast cancer Other    Esophageal cancer Neg Hx    Rectal cancer Neg Hx    Stomach cancer Neg Hx    Colon polyps Neg Hx     Social History   Socioeconomic History   Marital status: Single    Spouse name: Not on file   Number of children: Not on file   Years of education: Not on file   Highest education level: 11th grade  Occupational History   Occupation: Retired    Comment: Dispensing optician   Occupation: Retired    Comment: [employer]  Tobacco Use   Smoking status: Every Day    Current packs/day: 0.30    Average packs/day: 0.3 packs/day for 25.0 years (7.5 ttl pk-yrs)    Types: Cigarettes   Smokeless tobacco: Never   Tobacco comments:    .3 ppd  Vaping Use   Vaping status: Never Used  Substance and Sexual Activity   Alcohol use: Yes    Alcohol/week: 0.0 standard drinks of alcohol    Comment: occasionally   Drug use: No    Comment: positive marijuana metabolite in 2010   Sexual activity: Not Currently    Birth control/protection: Other-see comments, Post-menopausal    Comment: s/p tubal ligation  Other Topics Concern   Not on file  Social History Narrative   Current Social History 09/05/2020       Patient lives alone in a ground floor apartment. There is one step down without handrails to the entrance the patient uses.       Patient's method of transportation is personal car.      The highest level of education was 11 th grade      The patient currently retired from [employer], and  the [employer].      Identified important Relationships are Lucie and Kathi       Pets : Eddy Deem       Interests / Fun: Walking at the park       Current Stressors: I have no stress;  I'm good       Religious / Personal Beliefs: Holiness       L. Ducatte, BSN, RN-BC    Social Drivers of Health   Financial Resource Strain: Low Risk  (07/29/2023)   Overall Financial Resource Strain (CARDIA)    Difficulty of Paying Living Expenses: Not hard at all  Food Insecurity: Patient Declined (07/29/2023)   Hunger Vital Sign    Worried About Running Out of Food in the Last Year: Patient declined    Ran Out of Food in the Last Year: Patient declined  Transportation Needs: No Transportation Needs (07/29/2023)   PRAPARE - Administrator, Civil Service (Medical): No    Lack of Transportation (Non-Medical): No  Physical Activity: Inactive (07/29/2023)   Exercise Vital Sign    Days of Exercise per Week: 0 days    Minutes of Exercise per Session: 0 min  Stress: No Stress Concern Present (07/29/2023)   [employer] of Occupational Health - Occupational Stress Questionnaire    Feeling of Stress : Only a little  Social Connections: Unknown (07/29/2023)   Social Connection and Isolation Panel    Frequency of Communication with Friends and Family: More than three times a week    Frequency of Social Gatherings with Friends and Family: Patient declined    Attends Religious Services: Patient declined    Database administrator or Organizations: No    Attends Banker Meetings: Never    Marital Status: Never married  Intimate Partner Violence: Not At Risk (07/29/2023)   Humiliation, Afraid, Rape, and Kick questionnaire    Fear of Current or Ex-Partner: No    Emotionally Abused: No    Physically Abused: No    Sexually Abused: No    Review of Systems: ROS negative except for what is noted on the assessment and plan.  Vitals:   11/24/23 1028 11/24/23 1104  11/24/23 1105  BP: (!) 170/72 (!) 162/73 (!) 164/85  Pulse: 85 86 90  Temp: 98.8 F (37.1 C)    TempSrc: Oral    SpO2: 95%    Weight: 185 lb (83.9 kg)    Height: 5' 6 (1.676 m)      Physical Exam: Constitutional: well-appearing female  in no acute distress HENT: normocephalic atraumatic, mucous membranes moist Eyes: conjunctiva non-erythematous Neck: supple Cardiovascular: regular rate and rhythm, no m/r/g Pulmonary/Chest: normal work of breathing on room air, lungs clear to auscultation bilaterally Abdominal: soft, non-tender, non-distended MSK: normal bulk and tone, swelling present over L thumb, significant tenderness on palpation, and decreased range of motion   Assessment & Plan:   Essential hypertension Blood pressure elevated today in the clinic, initially 177/76, repeat is 162/73. She does not check her blood pressure at home. Her regimen is currently Benicar  20-12.5, and she did not take it this morning. Encouraged her to take her medication before coming to the clinic. She also states she has been intaking a lot of sodium (Jimmy Dean breakfast sandwiches every morning), and encouraged her to seek out healthier options. Will follow up at next visit, as per chart review her blood pressure has been stable on this regimen for quite some time when she has taken it.   Plan:  - Continue benicar  20-12.5  Uncontrolled type 2 diabetes mellitus with hyperglycemia, with long-term current use of insulin  (HCC) A1c improving from 9.0 to 8.0 today. Current regimen is Humalog  15U with meals, and Tresiba  20U. She also takes synjardy  12.5-1000mg  once a day.  Her freestyle herlene stopped working back in January of this year unfortunately, will send in a new one. Her A1c is coming down nicely as well, so will continue her regimen. I did encourage her to take the Synjardy  twice a day, which she is agreeable to.   Plan:  - Freestyle Libre order sent  - Contineu Humalog  15U with meal, and Tresiba   20U  - Synjardy  12.5mg -1000mg  BID - Follow up in three months  Goiter Pt with history of multinodular goiter with suspicious nodules. It was recommended patient get follow-up thyroid  ultrasound yearly but this has not been done since 2023. Will reorder one, and check TSH today. She does have an enlarged thyroid  on exam.   Thumb pain, left Pt states she accidentally jammed her thumb in a door three weeks ago and pain has not improved. She has very limited mobility of the thumb, and is painful to manipulate. Thumb x-ray does not show any fracture thankfully, so will encourage supportive treatment with thumb splinting, and ice as well.   Patient discussed with Dr. Machen  Baltasar Twilley, M.D. [facility] Internal Medicine, PGY-3 Pager: [phone] Date 11/25/2023 Time 7:45 AM

## 2023-11-25 NOTE — Assessment & Plan Note (Signed)
 Pt with history of multinodular goiter with suspicious nodules. It was recommended patient get follow-up thyroid  ultrasound yearly but this has not been done since 2023. Will reorder one, and check TSH today. She does have an enlarged thyroid  on exam.

## 2023-11-25 NOTE — Assessment & Plan Note (Signed)
 A1c improving from 9.0 to 8.0 today. Current regimen is Humalog  15U with meals, and Tresiba  20U. She also takes synjardy  12.5-1000mg  once a day. Her freestyle herlene stopped working back in January of this year unfortunately, will send in a new one. Her A1c is coming down nicely as well, so will continue her regimen. I did encourage her to take the Synjardy  twice a day, which she is agreeable to.   Plan:  - Freestyle Libre order sent  - Contineu Humalog  15U with meal, and Tresiba  20U  - Synjardy  12.5mg -1000mg  BID - Follow up in three months

## 2023-11-25 NOTE — Assessment & Plan Note (Signed)
 Blood pressure elevated today in the clinic, initially 177/76, repeat is 162/73. She does not check her blood pressure at home. Her regimen is currently Benicar  20-12.5, and she did not take it this morning. Encouraged her to take her medication before coming to the clinic. She also states she has been intaking a lot of sodium (Jimmy Dean breakfast sandwiches every morning), and encouraged her to seek out healthier options. Will follow up at next visit, as per chart review her blood pressure has been stable on this regimen for quite some time when she has taken it.   Plan:  - Continue benicar  20-12.5

## 2023-11-25 NOTE — Assessment & Plan Note (Signed)
 Pt states she accidentally jammed her thumb in a door three weeks ago and pain has not improved. She has very limited mobility of the thumb, and is painful to manipulate. Thumb x-ray does not show any fracture thankfully, so will encourage supportive treatment with thumb splinting, and ice as well.

## 2023-11-25 NOTE — Progress Notes (Signed)
 Internal Medicine Clinic Attending  Case discussed with the resident at the time of the visit.  We reviewed the resident's history and exam and pertinent patient test results.  I agree with the assessment, diagnosis, and plan of care documented in the resident's note.

## 2023-12-07 NOTE — Telephone Encounter (Signed)
 Copied from CRM [id number]. Topic: General - Other >> Dec 04, 2023  3:25 PM Mercer PEDLAR wrote: Reason for CRM: Patient would like a callback to help get thyroid  US  scheduled.

## 2023-12-09 ENCOUNTER — Telehealth: Payer: Self-pay | Admitting: *Deleted

## 2023-12-09 NOTE — Telephone Encounter (Signed)
 Called patient LVM regarding her appointment 12/15/[facility] at [facility]. She is to arrive 2:00 pm for the appointment. Her voice is in [place].  Appointment  reminder letter mailed to patient.

## 2023-12-10 ENCOUNTER — Other Ambulatory Visit: Payer: Self-pay | Admitting: Student

## 2023-12-10 DIAGNOSIS — E1129 Type 2 diabetes mellitus with other diabetic kidney complication: Secondary | ICD-10-CM

## 2023-12-15 ENCOUNTER — Ambulatory Visit (HOSPITAL_COMMUNITY)
Admission: RE | Admit: 2023-12-15 | Discharge: 2023-12-15 | Disposition: A | Discharge status: Home / Self Care | Source: Ambulatory Visit | Attending: Internal Medicine | Admitting: Internal Medicine

## 2023-12-15 DIAGNOSIS — E049 Nontoxic goiter, unspecified: Secondary | ICD-10-CM | POA: Insufficient documentation

## 2023-12-15 DIAGNOSIS — E042 Nontoxic multinodular goiter: Secondary | ICD-10-CM | POA: Diagnosis not present

## 2023-12-16 ENCOUNTER — Other Ambulatory Visit: Payer: Self-pay | Admitting: Student

## 2023-12-16 DIAGNOSIS — E049 Nontoxic goiter, unspecified: Secondary | ICD-10-CM

## 2023-12-16 NOTE — Progress Notes (Signed)
 Reviewed ultrasound with patient, and per recommendations one nodule needs to be aspirated via fine needle. Pt is agreeable, and I have placed a referral to interventional radiology.

## 2024-01-11 ENCOUNTER — Telehealth: Payer: Self-pay | Admitting: *Deleted

## 2024-01-11 NOTE — Telephone Encounter (Signed)
 Copied from CRM #[id number]. Topic: Clinical - Lab/Test Results >> Jan 08, 2024  1:09 PM Cherylann RAMAN wrote: Reason for CRM: Patient is requesting results from her Ultrasound on her thyroid . Also, patient is requesting to be scheduled for Bone Density and Ultrasound on right breast. Please contact patient at [phone] for additional information.

## 2024-02-04 ENCOUNTER — Ambulatory Visit: Admitting: Student

## 2024-02-04 ENCOUNTER — Encounter: Payer: Self-pay | Admitting: Student

## 2024-02-04 VITALS — BP 162/78 | HR 92 | Ht 66.0 in | Wt 183.2 lb

## 2024-02-04 DIAGNOSIS — M6289 Other specified disorders of muscle: Secondary | ICD-10-CM | POA: Diagnosis not present

## 2024-02-04 DIAGNOSIS — E049 Nontoxic goiter, unspecified: Secondary | ICD-10-CM

## 2024-02-04 DIAGNOSIS — B353 Tinea pedis: Secondary | ICD-10-CM

## 2024-02-04 MED ORDER — METHOCARBAMOL 500 MG PO TABS: 500.0000 mg | ORAL_TABLET | Freq: Three times a day (TID) | ORAL | 2 refills | Status: DC | PRN

## 2024-02-04 MED ORDER — TERBINAFINE HCL 1 % EX CREA: 1.0000 | TOPICAL_CREAM | Freq: Two times a day (BID) | CUTANEOUS | 0 refills | Status: AC

## 2024-02-04 MED ORDER — DIPHENHYDRAMINE-ZINC ACETATE 2-0.1 % EX CREA: 1.0000 | TOPICAL_CREAM | Freq: Three times a day (TID) | CUTANEOUS | 0 refills | Status: AC | PRN

## 2024-02-04 NOTE — Assessment & Plan Note (Signed)
 Asked for evaluation of the left posterior neck.  Feels that she has a small area of swelling and pain that comes and goes.  On my evaluation there is what feels like a muscle knot at the edge of the trapezius muscle and at the level of the angle of the jaw.  It is relatively smooth and diffuse, does not resemble a discrete lymph node.  No other areas of swelling or tenderness appreciated. -Robaxin  500 3 times daily as needed

## 2024-02-04 NOTE — Assessment & Plan Note (Signed)
 Complaint is 1 month of an itchy rash on the left foot.  3 discrete areas of hyperpigmentation and some scaliness that itch.  See photos above. - Will treat with terbinafine  cream

## 2024-02-04 NOTE — Assessment & Plan Note (Signed)
 Previously evaluated this clinic.  Ultrasound in late July identified 1 nodule that needs biopsy.  She was referred to IR but has not connected with them.  I will place another IR referral and instruct her to monitor her phone for a call.  Do not feel that her posterior neck issue was related to this goiter.

## 2024-02-04 NOTE — Progress Notes (Signed)
 CC: Acute visit for foot rash and neck tenderness  HPI:  Krista Sosa is a 73 y.o. female with a PMH stated below who presents today for acute evaluation.  Please see problem based assessment and plan for additional details.  Past Medical History:  Diagnosis Date   Allergy    Asthma, chronic 06/09/2006   Bilateral cataracts 05/07/2016   s/p left cataract extraction 10/02/2016 and right cataract extraction 10/16/2016   Bronchiectasis without complication (HCC) 05/13/2018   Mild left lower lobe bronchiectasis seen on CT scan of the thorax in December 2019   Cataract    bil eyes   Essential hypertension 02/12/2018   Gastroesophageal reflux disease 08/13/2011   Glaucoma    Per patient report   Hx of adenomatous colonic polyps 11/16/2008   Hypertension    pt said she is supposed to take med but she is not taking it.   Hypertensive retinopathy, grade 2, bilateral 05/07/2016   Inflamed epidermoid cyst of skin 07/02/2020   Latent tuberculosis    dx 03/17/08 - tx with INH X 9 months   Lichenoid dermatitis 02/13/2012   Previously treated with Triamcinolone  0.025% cream Q8-12H PRN    Migraine headache 08/06/2018   Osteoarthritis, generalized 05/21/2006   Diffuse: cervical spine, right shoulder, left wrist Ineffective medications: Tylenol , ASA, Naprosyn , Ibuprofen , Meloxicam      Overweight (BMI 25.0-29.9) 02/13/2012   Perennial allergic rhinitis with seasonal variation 06/09/2006   Persistent microalbuminuria associated with type II diabetes mellitus (HCC) 05/21/2006   Radiculopathy affecting upper extremity 01/02/2019   Sickle cell anemia (HCC)    pt has sickle cell trait per pt   Sickle cell trait (HCC) 02/13/2012   Smoker    1/2ppd   Split ear lobe 01/04/2019   Tobacco abuse disorder 06/09/2006   Tubular adenoma of rectum 11/16/2008   5 mm, excised endoscopically 11/16/2008, repeat colonoscopy due July 2015    Ulnar neuropathy of right upper extremity 02/14/2016   Urinary tract disease     JICP   Urticaria     Review of Systems: ROS negative except for what is noted on the assessment and plan.  Vitals:   02/04/24 1322 02/04/24 1344  BP: (!) 178/83 (!) 162/78  Pulse: 94 92  SpO2: 97%   Weight: 183 lb 3.2 oz (83.1 kg)   Height: 5' 6 (1.676 m)     Physical Exam: Constitutional: well-appearing woman in no acute distress HENT: normocephalic atraumatic, mucous membranes moist.  Goiter is present.  Muscle knot to posterior neck no overlying skin changes no lymphadenopathy. Cardiovascular: regular rate and rhythm, no m/r/g Pulmonary/Chest: normal work of breathing on room air, lungs clear to auscultation bilaterally MSK: normal bulk and tone Skin: warm and dry.  3 areas of hyperpigmentation predominantly on the dorsal aspect of the left foot.  See image below.  There is Goldbond powder as well. Psych: normal mood and behavior   Assessment & Plan:   Patient discussed with Dr. Shawn  Tinea pedis Complaint is 1 month of an itchy rash on the left foot.  3 discrete areas of hyperpigmentation and some scaliness that itch.  See photos above. - Will treat with terbinafine  cream  Muscle tightness Asked for evaluation of the left posterior neck.  Feels that she has a small area of swelling and pain that comes and goes.  On my evaluation there is what feels like a muscle knot at the edge of the trapezius muscle and at the level of the  angle of the jaw.  It is relatively smooth and diffuse, does not resemble a discrete lymph node.  No other areas of swelling or tenderness appreciated. -Robaxin  500 3 times daily as needed  Goiter Previously evaluated this clinic.  Ultrasound in late July identified 1 nodule that needs biopsy.  She was referred to IR but has not connected with them.  I will place another IR referral and instruct her to monitor her phone for a call.  Do not feel that her posterior neck issue was related to this goiter.  RTC as needed for these issues.  I have  asked her to contact this clinic next week if she has not heard about her IR referral.  Krista Sosa, D.O. [facility] Internal Medicine, PGY-2 Phone: [phone] Date 02/04/2024 Time 2:55 PM

## 2024-02-04 NOTE — Patient Instructions (Signed)
 Terbinafine  is an ointment to use on your foot. It is to treat tinea pedis, a fungus on the foot.  Methocarbamol  is a muscle relaxer, please see if it helps your neck. Take every 8 hours as needed. You do not need to take it if you have no pain.  Please expect a call to set up a biopsy of your thyroid . If you do not have a call by next week, please contact our clinic.

## 2024-02-05 NOTE — Progress Notes (Signed)
 Internal Medicine Clinic Attending  Case discussed with the resident at the time of the visit.  We reviewed the resident's history and exam and pertinent patient test results.  I agree with the assessment, diagnosis, and plan of care documented in the resident's note.

## 2024-02-18 ENCOUNTER — Other Ambulatory Visit (HOSPITAL_COMMUNITY): Payer: Self-pay

## 2024-02-18 ENCOUNTER — Other Ambulatory Visit (INDEPENDENT_AMBULATORY_CARE_PROVIDER_SITE_OTHER)

## 2024-02-18 DIAGNOSIS — E1129 Type 2 diabetes mellitus with other diabetic kidney complication: Secondary | ICD-10-CM

## 2024-02-18 DIAGNOSIS — R809 Proteinuria, unspecified: Secondary | ICD-10-CM

## 2024-02-18 MED ORDER — BD PEN NEEDLE NANO ULTRAFINE 32G X 4 MM MISC: 3 refills | Status: AC

## 2024-02-18 MED ORDER — FREESTYLE LIBRE 3 PLUS SENSOR MISC: 3 refills | Status: AC

## 2024-02-18 NOTE — Progress Notes (Signed)
 02/18/2024 Name: Krista Sosa MRN: [id number] DOB: June 06, 1950  Chief Complaint  Patient presents with   Diabetes   Hypertension    Krista Sosa is a 73 y.o. year old female who presented for a telephone visit.   They were referred to the pharmacist by a quality report for assistance in managing diabetes. PMH includes HTN, ashtma, GERD with dysphagia, T2DM,   Patient was identified as falling into the [place] Measure - Diabetes.   Patient was: Referred to pharmacy for chronic disease management.    Subjective: Patient was last seen by PCP, Roetta Chars, MD, on 11/24/23. At last visit, BP was elevated in clinic to 162/73 mmHg on repeat, but she had not taken her BP medication that AM. A1C had improved from 9.0% to 8.0%. She was encouraged to take Synjardy  twice daily and restart using her Freestyle Libre CGM. She was seen in clinic again on 02/04/24 for an acute visit for tinea pedis. BP remained elevated at 162/78 mmHg.  Today, patient reports doing ok. She reports that she has a new CGM receiver [facility] 3) but does not have sensors to go with it. She reports she has not been feeling well due to her neck pain, and she never spoke with anyone about the results from her thyroid  US . Appears she needs to have a FNA, but this has not been scheduled yet. She says she can hardly move her neck. She reports that she has good supplies of her insulin  and other medications.   Care Team: Primary Care Provider: Nooruddin, Saad, MD ; Next Scheduled Visit: Needs to be scheduled   Medication Access/Adherence  Current Pharmacy:  [facility] [phone] [address], [place] - [address] [place] [address] Phone: [phone] Fax: [phone]  [facility] - [place], [place] - [address] [place] [address] Phone: [phone] Fax: [phone]   Patient reports affordability concerns with  their medications: No  Patient reports access/transportation concerns to their pharmacy: No - typically uses [facility] mail order pharmacy  Patient reports adherence concerns with their medications:  No  - although Tresiba  has not been filled since Feb 2025 (for 150 ds)  Denies missed doses of insulin . Reports that she needs a refill of needles.   Diabetes:  Current medications: Tresiba  25 units daily (last dispensed 07/20/23 for 150ds, reports taking 20 units daily), Humalog  12 units in the AM with breakfast and 15 units in the evening with dinner (reports taking 15 units TID - reports she takes TID regardless of meals), Synjardy  (empagliflozin -metformin  IR) 12.09-998 mg BID  Medications tried in the past: glipizide , metformin  IR, pioglitazone , sitagliptin  (Januvia ) (was on DPP4i instead of GLP-1 due to aversion to needles), Janumet  (sitagliptin -metformin )  Current glucose readings: not currently monitoring Freestyle Libre 3 plus - has receiver, no sensors Has android phone - likely compatible with sensors  Patient denies hypoglycemic s/sx including dizziness, shakiness Reports sweating. Patient denies hyperglycemic symptoms including polyphagia, nocturia, neuropathy, blurred vision. Reports occasional polydipisa, polyphagia.  Current meal patterns: 2-3 meals/day. Eats a lot of vegetables, baked meat. - Drinks: water, sweet tea (not every day, 1 bottle a couple times per week)  Current physical activity: did not discuss today  Current medication access support: [employer] Dual Complete  Hypertension:  Current medications: olmesartan -hydrochlorothiazide  20-12.5 mg daily Medications previously tried n/a   Patient does not have a validated, automated, upper arm home BP cuff -  reports she is able to use the arm cuff by herself. She would prefer a wrist cuff. Discussed that she can use her [facility] U card to buy one at the pharmacy.  Current blood pressure readings readings: n/a  Patient denies  hypotensive s/sx including dizziness, lightheadedness.  Patient denies hypertensive symptoms including  chest pain, shortness of breath - reports she is having HA related to her neck pain.   Hyperlipidemia/ASCVD Risk Reduction  Current lipid lowering medications: atorvastatin  80 mg daily  Clinical ASCVD: No  The 10-year ASCVD risk score (Arnett DK, et al., 2019) is: 65.8%   Values used to calculate the score:     Age: 73 years     Clincally relevant sex: Female     Is Non-Hispanic African American: Yes     Diabetic: Yes     Tobacco smoker: Yes     Systolic Blood Pressure: 162 mmHg     Is BP treated: Yes     HDL Cholesterol: 54 mg/dL     Total Cholesterol: 216 mg/dL   Tobacco Abuse:  Tobacco Use History: Number of cigarettes per day 1 pack last her 3 days   Quit Attempt History: Most recent quit attempt - a long time ago, was not successful  Objective:  BP Readings from Last 3 Encounters:  02/04/24 (!) 162/78  11/24/23 (!) 164/85  08/20/23 (!) 119/58    Lab Results  Component Value Date   HGBA1C 8.0 (A) 11/24/2023   HGBA1C 9.0 (A) 08/20/2023   HGBA1C 8.9 (A) 07/14/2022       Latest Ref Rng & Units 08/20/2023    3:47 PM 03/04/2022    3:58 PM 08/06/2021    4:11 PM  BMP  Glucose 70 - 99 mg/dL 829  68  853   BUN 8 - 27 mg/dL 21  20  14    Creatinine 0.57 - 1.00 mg/dL 9.22  9.34  9.22   BUN/Creat Ratio 12 - 28 27  31  18    Sodium 134 - 144 mmol/L 139  139  139   Potassium 3.5 - 5.2 mmol/L 4.3  4.3  4.5   Chloride 96 - 106 mmol/L 102  104  106   CO2 20 - 29 mmol/L 21  22  20    Calcium  8.7 - 10.3 mg/dL 9.9  9.6  9.4     Lab Results  Component Value Date   CHOL 216 (H) 03/04/2022   HDL 54 03/04/2022   LDLCALC 136 (H) 03/04/2022   TRIG 148 03/04/2022   CHOLHDL 4.0 03/04/2022    Medications Reviewed Today     Reviewed by Brinda Lorain SQUIBB, RPH (Pharmacist) on 02/18/24 at 1410  Med List Status: <None>   Medication Order Taking? Sig Documenting Provider Last Dose  Status Informant  albuterol  (VENTOLIN  HFA) 108 (90 Base) MCG/ACT inhaler [id number]  Inhale 1-2 puffs into the lungs every 4 (four) hours as needed for wheezing or shortness of breath (coughing fits). Use with spacer. Nooruddin, Saad, MD  Active   atorvastatin  (LIPITOR) 80 MG tablet [id number] Yes Take 1 tablet (80 mg total) by mouth daily. Nooruddin, Saad, MD  Active   BD PEN NEEDLE NANO ULTRAFINE 32G X 4 MM MISC [id number]  USE TO INJECT INSULIN   SUBCUTANEOUSLY 3 TIMES DAILY Nooruddin, Saad, MD  Active   clotrimazole -betamethasone  (LOTRISONE ) cream [id number]  APPLY TOPICALLY TO AFFECTED  AREA(S) TWICE DAILY Nooruddin, Saad, MD  Active   Continuous Glucose Receiver (FREESTYLE LIBRE 3 READER) DEVI [id number]  Use with Freestyle Libree 3 plus sensors to monitor your blood sugar at least 6 times a day Nooruddin, Saad, MD  Active   Continuous Glucose Sensor (FREESTYLE LIBRE 3 PLUS SENSOR) MISC [id number]  Change sensor every 15 days. Nooruddin, Saad, MD  Active   diclofenac  Sodium (VOLTAREN ) 1 % GEL [id number]  Apply 4 g topically 4 (four) times daily. Nooruddin, Saad, MD  Active   diphenhydrAMINE -zinc  acetate (BENADRYL  EXTRA STRENGTH) cream [id number]  Apply 1 Application topically 3 (three) times daily as needed for itching. Harrie Bruckner, DO  Active   DULoxetine  (CYMBALTA ) 30 MG capsule [id number] Yes TAKE 2 CAPSULES BY MOUTH DAILY Nooruddin, Saad, MD  Active   insulin  degludec (TRESIBA  FLEXTOUCH) 100 UNIT/ML FlexTouch Pen [id number]  INJECT 25 UNITS INTO THE SKIN DAILY. Nooruddin, Saad, MD  Active   insulin  lispro (HUMALOG  KWIKPEN) 100 UNIT/ML KwikPen [id number]  INJECT SUBCUTANEOUSLY 12 UNITS  IN THE MORNING AND 15 UNITS IN  THE EVENING WITH LARGEST MEALS Leopold Damien NOVAK, MD  Active   methocarbamol  (ROBAXIN ) 500 MG tablet [id number]  Take 1 tablet (500 mg total) by mouth every 8 (eight) hours as needed. Harrie Bruckner, DO  Active   olmesartan -hydrochlorothiazide  (BENICAR  HCT) 20-12.5 MG tablet  [id number] Yes TAKE 1 TABLET BY MOUTH DAILY Nooruddin, Saad, MD  Active   Skin Protectants, Misc. (EUCERIN) cream [id number]  Apply topically as needed for dry skin. Teofilo Satterfield, MD  Active   SYNJARDY  12.09-998 MG TABS [id number] Yes TAKE 1 TABLET BY MOUTH TWICE  DAILY Nooruddin, Saad, MD  Active   terbinafine  (LAMISIL ) 1 % cream [id number]  Apply 1 Application topically 2 (two) times daily. Harrie Bruckner, DO  Active   TRELEGY ELLIPTA  200-62.5-25 MCG/ACT AEPB [id number] Yes USE 1 INHALATION BY MOUTH DAILY Nooruddin, Saad, MD  Active   triamcinolone  (KENALOG ) 0.025 % ointment [id number]  APPLY TOPICALLY TWICE DAILY AS DIRECTED Nooruddin, Saad, MD  Active     Discontinued 04/18/13 1412 (Reorder)               Assessment/Plan:   Diabetes: - Currently uncontrolled with most recent A1C of 8.0% above goal <7%. Medication adherence appears appropriate, per patient report. Patient agreeable to meet in person to set up CGM. She may be a candidate for GLP-1 in the future (oral GLP-1RA like Rybelsus due to aversion to needles), however, she has an outstanding order for a FNA of a thyroid  nodule, so will await these results.  - Last UACR May 2020 - 185 mg/g - due for repeat UACR - Reviewed long term cardiovascular and renal outcomes of uncontrolled blood sugar - Reviewed goal A1c, goal fasting, and goal 2 hour post prandial glucose - Reviewed hypoglycemia management plan and the rule of 15 - Recommend to continue Tresiba  20 units once daily - Recommend to continue Humalog  15 units TID, but advised patient to only take WITH meals. Advised to inject 15-20 minutes before eating.   - Recommend to check glucose continuously using FL3+ CGM. Will reorder sensors to [facility]. Advised patient to bring all supplies to clinic visit for setup. She does not currently have backup glucometer supplies. - Next A1C due 02/24/24     Hypertension: - Currently uncontrolled with clinic BP consistently above goal  less than 130/80. Patient is not having s/sx of hypo- or hyper-tension. Medication adherence appears appropriate per fill hx. If next clinic BP is uncontrolled, will consider escalating dose of combination ARB-thiazide pill.  - Reviewed long term cardiovascular and renal outcomes  of uncontrolled blood pressure - Reviewed appropriate blood pressure monitoring technique and reviewed goal blood pressure. Recommended to check home blood pressure and heart rate  once daily and keep a log to bring to upcoming appointments - Advised her that she can purchase wrist cuff (for ease of use) with her U card at a local pharmacy - Recommend to continue olmesartan -hydrochlorothiazide  20-12.5 mg daily - Advised patient to take her BP medication as scheduled prior to her next appt    Hyperlipidemia/ASCVD Risk Reduction: - Currently uncontrolled with most recent LDL-C of 136 mg/dL above goal < 70 mg/dL given U7IF + comorbidities. High intensity statin indicated. Repeat lipid panel due.  - Reviewed long term complications of uncontrolled cholesterol - Recommend to continue atorvastatin  80 mg daily - Will obtain repeat lipid panel at f/u - Advised patient to consider initiating therapy for smoking cessation. Will discuss further at follow-up.    Patient verbalized understanding of treatment plan. Will coordinate with PCP to f/u thyroid  results and scheduling FNA.  Follow Up Plan:  Pharmacist in person 02/29/24 PCP clinic visit - needs to be scheduled ~Nov 2025   Lorain Baseman, PharmD [facility] [phone]

## 2024-02-25 ENCOUNTER — Ambulatory Visit: Payer: Self-pay

## 2024-02-25 NOTE — Telephone Encounter (Signed)
 FYI Only or Action Required?: FYI only for provider.  Patient was last seen in primary care on 02/04/2024 by Harrie Bruckner, DO.  Called Nurse Triage reporting Pain.  Symptoms began about a month ago.  Interventions attempted: Nothing.  Symptoms are: gradually worsening.  Triage Disposition: See Physician Within 24 Hours  Patient/caregiver understands and will follow disposition?: Yes   Copied from CRM #[id number]. Topic: Clinical - Red Word Triage >> Feb 25, 2024  9:37 AM Miquel SAILOR wrote: Red Word that prompted transfer to Nurse Triage: LT arm to neck arthritis since 1 month getting worse Reason for Disposition  Numbness (i.e., loss of sensation) in hand or fingers  (Exceptions: Just tingling; numbness present > 2 weeks.)  Answer Assessment - Initial Assessment Questions 1. ONSET: When did the pain start?     X month 2. LOCATION: Where is the pain located?     Left arm 3. PAIN: How bad is the pain? (Scale 0-10; or none, mild, moderate, severe)     severe 4. WORK OR EXERCISE: Has there been any recent work or exercise that involved this part of the body?     na 5. CAUSE: What do you think is causing the arm pain?     Possible arthritis 6. OTHER SYMPTOMS: Do you have any other symptoms? (e.g., neck pain, swelling, rash, fever, numbness, weakness)     Swelling, neck pain, numbness, tingling, weakness 7. PREGNANCY: Is there any chance you are pregnant? When was your last menstrual period?     na  Protocols used: Arm Pain-A-AH

## 2024-02-26 ENCOUNTER — Ambulatory Visit (INDEPENDENT_AMBULATORY_CARE_PROVIDER_SITE_OTHER)

## 2024-02-26 VITALS — BP 146/79 | HR 73 | Ht 66.0 in | Wt 184.2 lb

## 2024-02-26 DIAGNOSIS — M6289 Other specified disorders of muscle: Secondary | ICD-10-CM

## 2024-02-26 MED ORDER — CYCLOBENZAPRINE HCL 5 MG PO TABS: 5.0000 mg | ORAL_TABLET | Freq: Every day | ORAL | 0 refills | Status: AC

## 2024-02-26 MED ORDER — LIDOCAINE 5 % EX PTCH: 1.0000 | MEDICATED_PATCH | CUTANEOUS | 0 refills | Status: AC

## 2024-02-26 NOTE — Patient Instructions (Signed)
 Today we discussed the following medical conditions and plan:   for your shoulder pain I think this could be due to a lot of muscle tightness in your left shoulder and even some of your right shoulder.  I have given you some muscle relaxers that you should take at night because they can make you sleepy.  I also recommend using something like IcyHot and I have sent lidocaine  patches to your pharmacy that you can use to help with some of the pain.  I have also sent a referral for physical therapy, they should call and talk to you about setting up an appointment but if you do not hear from them let us  know.  We look forward to seeing you next time. Please call our clinic at [phone] if you have any questions or concerns. The best time to call is Monday-Friday from 9am-4pm, but there is someone available 24/7. If you need medication refills, please notify your pharmacy one week in advance and they will send us  a request.   Thank you for trusting me with your care. Wishing you the best!   Betzaira Mentel D'Mello, DO  [facility]

## 2024-02-26 NOTE — Progress Notes (Addendum)
 Acute Office Visit  Subjective:     Patient ID: Krista Sosa, female    DOB: 1950/09/04, 73 y.o.   MRN: [id number]  Chief Complaint  Patient presents with   Joint Pain    Pain in shoulder and neck arthritis it was bothering me the last time I was here     HPI Patient is in today for left arm pain, going up her cervical spine. Patient denies any falls or any other trauma to the area.  No previous history of surgeries.  Upon chart review patient has been treated with naproxen  500 mg before and physical therapy.  Last office visit on 02/04/2024 patient was prescribed Robaxin  503 times a day but states this did not help.  States that she has numbness and tingling in the left shoulder but endorses that it occasionally goes down her left arm.  He says the pain keeps her up at night and it is a constant pain but has suddenly gotten worse.  Patient is taking aleve  two pills twice a day. Goody powder one in the morning and one at night. Patient states that the naproxen  500 mg twice a day course worked to help her pain. Patient states that she does not want to go to physical pain with the pain being that bad. States that she has tried physical therapy in the past and that it has helped intermittently but she does not want to do physical therapy right now until she gets the pain under control.  Patient states that she is left-hand dominant.  ROS See HPI for details     Objective:    BP (!) 146/79 (BP Location: Right Arm, Patient Position: Sitting, Cuff Size: Normal)   Pulse 73   Ht 5' 6 (1.676 m)   Wt 184 lb 3.2 oz (83.6 kg)   LMP 08/27/2007   SpO2 95%   BMI 29.73 kg/m  BP Readings from Last 3 Encounters:  02/26/24 (!) 146/79  02/04/24 (!) 162/78  11/24/23 (!) 164/85   Wt Readings from Last 3 Encounters:  02/26/24 184 lb 3.2 oz (83.6 kg)  02/04/24 183 lb 3.2 oz (83.1 kg)  11/24/23 185 lb (83.9 kg)      Physical Exam Constitution: Alert, wincing in pain but otherwise in no  acute distress Heart: Regular rate and rhythm, no murmurs Lungs: Clear to auscultation, no respiratory distress MSK: Exam limited due to pain.   Upon inspection of the skin no erythema or lesions noted No cervical midline tenderness Tightness felt on the left paraspinal cervical muscles with some tender points on both left paraspinal and right paraspinal area.  No joint line tenderness on left shoulder but tenderness to palpation across the left trapezius. Limited in left cervical rotation due to pain, mildly limited in the right.  Intact abduction, but limited motion due to pain with internal rotation and shoulder elevation on the left.  Limited internal rotation in the right upper extremity as well. All other ROM on right is intact  Spurling's test positive on left.  Empty can test positive bilaterally.  Grip strength 5 out of 5 bilaterally.  Sensation intact in bilateral upper extremities No results found for any visits on 02/26/24.      Assessment & Plan:   Problem List Items Addressed This Visit     Muscle tightness - Primary   Upon chart review patient has had history of this before. Says that robaxin  did not  help with pain. Patient MRI C- spine  in 2020, showed disc degeneration and impingement that is advanced on the left at C4-5,but patient's symptoms of pain on left paraspinal muscles, left trapezius seem more MSK related. Tender points more noticeable, especially in the left trapezius.   Plan: Trial icy/hot  Lidocaine  patches sent to pharmacy to apply to area Flexeril  5 mg at bedtime for muscle spasms,with caution that medication can make patient lethargic  Referral sent for physical therapy       Relevant Orders   Ambulatory referral to Physical Therapy    Meds ordered this encounter  Medications   lidocaine  (LIDODERM ) 5 %    Sig: Place 1 patch onto the skin daily for 10 days. Remove & Discard patch within 12 hours or as directed by MD    Dispense:  10 patch     Refill:  0   cyclobenzaprine  (FLEXERIL ) 5 MG tablet    Sig: Take 1 tablet (5 mg total) by mouth at bedtime.    Dispense:  30 tablet    Refill:  0    Return if symptoms worsen or fail to improve.  Asante Ritacco D'Mello, DO  Patient seen with Dr.Williams

## 2024-02-26 NOTE — Assessment & Plan Note (Addendum)
 Upon chart review patient has had history of this before. Patient MRI C- spine in 2020, showed disc degeneration and impingement that is advanced on the left at C4-5.Although there could be some cervical radiculopathy in the picture, patient's chief complaint of pain in left paraspinal muscles cervical and thoracic area,  seems more muscle related related. Tender points are noted, especially in the left trapezius and muscle tightness in the cervical and thoracic paraspinal area.    Plan: Trial icy/hot  Lidocaine  patches sent to pharmacy to apply to area Flexeril  5 mg at bedtime for muscle spasms,with caution that medication can make patient lethargic  Referral sent for physical therapy

## 2024-02-26 NOTE — Progress Notes (Deleted)
 CC: Acute visit  HPI: [name] is a 73 y.o. female living with a history stated below and presents today for acute visit. Please see problem based assessment and plan for additional details.  Past Medical History:  Diagnosis Date   Allergy    Asthma, chronic 06/09/2006   Bilateral cataracts 05/07/2016   s/p left cataract extraction 10/02/2016 and right cataract extraction 10/16/2016   Bronchiectasis without complication (HCC) 05/13/2018   Mild left lower lobe bronchiectasis seen on CT scan of the thorax in December 2019   Cataract    bil eyes   Essential hypertension 02/12/2018   Gastroesophageal reflux disease 08/13/2011   Glaucoma    Per patient report   Hx of adenomatous colonic polyps 11/16/2008   Hypertension    pt said she is supposed to take med but she is not taking it.   Hypertensive retinopathy, grade 2, bilateral 05/07/2016   Inflamed epidermoid cyst of skin 07/02/2020   Latent tuberculosis    dx 03/17/08 - tx with INH X 9 months   Lichenoid dermatitis 02/13/2012   Previously treated with Triamcinolone  0.025% cream Q8-12H PRN    Migraine headache 08/06/2018   Osteoarthritis, generalized 05/21/2006   Diffuse: cervical spine, right shoulder, left wrist Ineffective medications: Tylenol , ASA, Naprosyn , Ibuprofen , Meloxicam      Overweight (BMI 25.0-29.9) 02/13/2012   Perennial allergic rhinitis with seasonal variation 06/09/2006   Persistent microalbuminuria associated with type II diabetes mellitus (HCC) 05/21/2006   Radiculopathy affecting upper extremity 01/02/2019   Sickle cell anemia (HCC)    pt has sickle cell trait per pt   Sickle cell trait 02/13/2012   Smoker    1/2ppd   Split ear lobe 01/04/2019   Tobacco abuse disorder 06/09/2006   Tubular adenoma of rectum 11/16/2008   5 mm, excised endoscopically 11/16/2008, repeat colonoscopy due July 2015    Ulnar neuropathy of right upper extremity 02/14/2016   Urinary tract disease    JICP   Urticaria     Current  Outpatient Medications on File Prior to Visit  Medication Sig Dispense Refill   albuterol  (VENTOLIN  HFA) 108 (90 Base) MCG/ACT inhaler Inhale 1-2 puffs into the lungs every 4 (four) hours as needed for wheezing or shortness of breath (coughing fits). Use with spacer. 1 each 1   atorvastatin  (LIPITOR) 80 MG tablet Take 1 tablet (80 mg total) by mouth daily. 90 tablet 3   clotrimazole -betamethasone  (LOTRISONE ) cream APPLY TOPICALLY TO AFFECTED  AREA(S) TWICE DAILY 45 g 2   Continuous Glucose Receiver (FREESTYLE LIBRE 3 READER) DEVI Use with Freestyle Libree 3 plus sensors to monitor your blood sugar at least 6 times a day 1 each 1   Continuous Glucose Sensor (FREESTYLE LIBRE 3 PLUS SENSOR) MISC Change sensor every 15 days. 6 each 3   diclofenac  Sodium (VOLTAREN ) 1 % GEL Apply 4 g topically 4 (four) times daily. 150 g 2   diphenhydrAMINE -zinc  acetate (BENADRYL  EXTRA STRENGTH) cream Apply 1 Application topically 3 (three) times daily as needed for itching. 28.4 g 0   DULoxetine  (CYMBALTA ) 30 MG capsule TAKE 2 CAPSULES BY MOUTH DAILY 90 capsule 7   insulin  degludec (TRESIBA  FLEXTOUCH) 100 UNIT/ML FlexTouch Pen INJECT 25 UNITS INTO THE SKIN DAILY. 3 mL 3   insulin  lispro (HUMALOG  KWIKPEN) 100 UNIT/ML KwikPen INJECT SUBCUTANEOUSLY 12 UNITS  IN THE MORNING AND 15 UNITS IN  THE EVENING WITH LARGEST MEALS 30 mL 2   Insulin  Pen Needle (BD PEN NEEDLE NANO ULTRAFINE) 32G X  4 MM MISC Use as directed to inject insulin  4 times per day 400 each 3   methocarbamol  (ROBAXIN ) 500 MG tablet Take 1 tablet (500 mg total) by mouth every 8 (eight) hours as needed. 30 tablet 2   olmesartan -hydrochlorothiazide  (BENICAR  HCT) 20-12.5 MG tablet TAKE 1 TABLET BY MOUTH DAILY 100 tablet 2   Skin Protectants, Misc. (EUCERIN) cream Apply topically as needed for dry skin. 454 g 1   SYNJARDY  12.09-998 MG TABS TAKE 1 TABLET BY MOUTH TWICE  DAILY 200 tablet 2   terbinafine  (LAMISIL ) 1 % cream Apply 1 Application topically 2 (two) times  daily. 30 g 0   TRELEGY ELLIPTA  200-62.5-25 MCG/ACT AEPB USE 1 INHALATION BY MOUTH DAILY 180 each 3   triamcinolone  (KENALOG ) 0.025 % ointment APPLY TOPICALLY TWICE DAILY AS DIRECTED 30 g 3   [DISCONTINUED] Triamcinolone  Acetonide (TRIAMCINOLONE  0.1 % CREAM : EUCERIN) CREA Apply 1 application topically 2 (two) times daily as needed. 454 each 3   No current facility-administered medications on file prior to visit.    Family History  Problem Relation Age of Onset   Diabetes Mother    Heart disease Mother 33   Colon cancer Mother    Cancer Mother        Colon   Cancer Father        Lung   Sickle cell anemia Sister    Diabetes Sister    Brain cancer Sister    Cancer Sister        brain   Healthy Sister    Healthy Sister    Healthy Sister    Sickle cell anemia Brother    Lung cancer Brother    Cancer Brother        Lung   Breast cancer Maternal Aunt    Wilson's disease Maternal Uncle    Healthy Daughter    Healthy Daughter    Healthy Daughter    Healthy Son    Healthy Son    Breast cancer Other    Esophageal cancer Neg Hx    Rectal cancer Neg Hx    Stomach cancer Neg Hx    Colon polyps Neg Hx     Social History   Socioeconomic History   Marital status: Single    Spouse name: Not on file   Number of children: Not on file   Years of education: Not on file   Highest education level: 11th grade  Occupational History   Occupation: Retired    Comment: Dispensing optician   Occupation: Retired    Comment: [employer]  Tobacco Use   Smoking status: Every Day    Current packs/day: 0.30    Average packs/day: 0.3 packs/day for 25.0 years (7.5 ttl pk-yrs)    Types: Cigarettes   Smokeless tobacco: Never   Tobacco comments:    .3 ppd  Vaping Use   Vaping status: Never Used  Substance and Sexual Activity   Alcohol use: Yes    Alcohol/week: 0.0 standard drinks of alcohol    Comment: occasionally   Drug use: No    Comment: positive marijuana metabolite in 2010   Sexual activity:  Not Currently    Birth control/protection: Other-see comments, Post-menopausal    Comment: s/p tubal ligation  Other Topics Concern   Not on file  Social History Narrative   Current Social History 09/05/2020       Patient lives alone in a ground floor apartment. There is one step down without handrails to the entrance the  patient uses.       Patient's method of transportation is personal car.      The highest level of education was 11 th grade      The patient currently retired from [employer], and the [employer].      Identified important Relationships are Lucie and Kathi       Pets : Eddy Deem       Interests / Fun: Walking at the park       Current Stressors: I have no stress; I'm good       Religious / Personal Beliefs: Holiness       L. Ducatte, BSN, RN-BC    Social Drivers of Health   Financial Resource Strain: Low Risk  (07/29/2023)   Overall Financial Resource Strain (CARDIA)    Difficulty of Paying Living Expenses: Not hard at all  Food Insecurity: Patient Declined (07/29/2023)   Hunger Vital Sign    Worried About Running Out of Food in the Last Year: Patient declined    Ran Out of Food in the Last Year: Patient declined  Transportation Needs: No Transportation Needs (07/29/2023)   PRAPARE - Administrator, Civil Service (Medical): No    Lack of Transportation (Non-Medical): No  Physical Activity: Inactive (07/29/2023)   Exercise Vital Sign    Days of Exercise per Week: 0 days    Minutes of Exercise per Session: 0 min  Stress: No Stress Concern Present (07/29/2023)   [employer] of Occupational Health - Occupational Stress Questionnaire    Feeling of Stress : Only a little  Social Connections: Unknown (07/29/2023)   Social Connection and Isolation Panel    Frequency of Communication with Friends and Family: More than three times a week    Frequency of Social Gatherings with Friends and Family: Patient declined    Attends  Religious Services: Patient declined    Database administrator or Organizations: No    Attends Banker Meetings: Never    Marital Status: Never married  Intimate Partner Violence: Not At Risk (07/29/2023)   Humiliation, Afraid, Rape, and Kick questionnaire    Fear of Current or Ex-Partner: No    Emotionally Abused: No    Physically Abused: No    Sexually Abused: No    Review of Systems: ROS negative except for what is noted on the assessment and plan.  There were no vitals filed for this visit.  Physical Exam  Physical Exam: Constitutional: well-appearing *** sitting in ***, in no acute distress HENT: normocephalic atraumatic, mucous membranes moist Eyes: conjunctiva non-erythematous Neck: supple Cardiovascular: regular rate and rhythm, no m/r/g Pulmonary/Chest: normal work of breathing on room air, lungs clear to auscultation bilaterally Abdominal: soft, non-tender, non-distended MSK: *** Neurological: alert & oriented x 3, 5/5 strength in bilateral upper and lower extremities, normal gait Skin: warm and dry Psych: ***  Assessment & Plan:   Assessment & Plan   No orders of the defined types were placed in this encounter.  PMH: HTN, asthma, bronchiectasis, GERD with esophageal dysphagia, T2DM, goiter, cervical radiculopathy, tobacco use, sickle cell trait, MDD, HLD  Acute visit: Left arm and neck pain Seen for cervical radiculopathy on 07/2023 of the left upper extremity  atient states that for the last 2 days she has had left arm pain starting in her neck and shooting down her arm.  She denies any trauma or falls to the area.  She denies any numbness or tingling.  There is significant tenderness of the left cervical area, and Spurling's test is positive.  I think she would benefit from anti-inflammatory therapy, she is already on duloxetine  for depression however she is unsure that she is taking it.  Advised her to bring her medications in with her at her next  visit.  In the meanwhile, I put in a referral to physical therapy, and will trial a short course of naproxen  500 mg twice a day.  Can consider imaging if problem does not resolve with physical therapy.   ***MR C-spine in 02/2027 with disc degeneration of C4-7 with impingement that is advanced on the left at C4-5 and right C5-6.  Disc osteophyte complex causes ventral cord deformity on the left C4-5  Order completed thyroid  ultrasound with plans for IR intervention for 1 nodule for FNA   Status: ***. Last A1c of 8.0 in 11/2023.  A1c today is***.  Currently taking Tresiba  20 units, Humalog  15 units 3 times daily, Synjardy  12.5 mg - 1000 mg twice daily***.  Patient *** hypoglycemia.  Per review of ***, average blood glucose is***.  Average fasting blood glucose is***. ***Foot exam.   Plan -Continue *** -A1c *** -Ophthalmology exam: ***New referral -Urine ACR ***, on ARB and SGLT2 -LDL 136 on 02/2022, taking atorvastatin  80 mg  CARE: Ophthalmology, lipid, flu, A1c    No follow-ups on file.   Patient {[id number]::discussed with,seen with} Dr. {[id number]::Tpoopjfd,Z. Hoffman,Winfrey,Narendra,Chun,Chambliss,Lau,Machen}  Ozell Nearing, D.O. [facility] Internal Medicine, PGY-3 Phone: [phone] Date 02/26/2024 Time 8:02 AM

## 2024-02-29 ENCOUNTER — Ambulatory Visit

## 2024-02-29 VITALS — BP 145/72 | HR 87

## 2024-02-29 DIAGNOSIS — Z23 Encounter for immunization: Secondary | ICD-10-CM | POA: Diagnosis not present

## 2024-02-29 DIAGNOSIS — E785 Hyperlipidemia, unspecified: Secondary | ICD-10-CM

## 2024-02-29 DIAGNOSIS — Z794 Long term (current) use of insulin: Secondary | ICD-10-CM

## 2024-02-29 DIAGNOSIS — Z91148 Patient's other noncompliance with medication regimen for other reason: Secondary | ICD-10-CM | POA: Diagnosis not present

## 2024-02-29 DIAGNOSIS — E1165 Type 2 diabetes mellitus with hyperglycemia: Secondary | ICD-10-CM

## 2024-02-29 DIAGNOSIS — Z7984 Long term (current) use of oral hypoglycemic drugs: Secondary | ICD-10-CM

## 2024-02-29 DIAGNOSIS — I10 Essential (primary) hypertension: Secondary | ICD-10-CM | POA: Diagnosis not present

## 2024-02-29 DIAGNOSIS — E1129 Type 2 diabetes mellitus with other diabetic kidney complication: Secondary | ICD-10-CM

## 2024-02-29 DIAGNOSIS — M792 Neuralgia and neuritis, unspecified: Secondary | ICD-10-CM

## 2024-02-29 DIAGNOSIS — G8929 Other chronic pain: Secondary | ICD-10-CM

## 2024-02-29 LAB — GLUCOSE, CAPILLARY: Glucose-Capillary: 182 mg/dL — ABNORMAL HIGH (ref 70–99)

## 2024-02-29 LAB — POCT GLYCOSYLATED HEMOGLOBIN (HGB A1C): HbA1c, POC (controlled diabetic range): 9.1 % — AB (ref 0.0–7.0)

## 2024-02-29 MED ORDER — TRESIBA FLEXTOUCH 100 UNIT/ML ~~LOC~~ SOPN
20.0000 [IU] | PEN_INJECTOR | Freq: Every day | SUBCUTANEOUS | 3 refills | Status: DC
Start: 2024-02-29 — End: 2024-03-31

## 2024-02-29 MED ORDER — DULOXETINE HCL 30 MG PO CPEP: 60.0000 mg | ORAL_CAPSULE | Freq: Every day | ORAL | 3 refills | Status: AC

## 2024-02-29 MED ORDER — ATORVASTATIN CALCIUM 80 MG PO TABS: 80.0000 mg | ORAL_TABLET | Freq: Every day | ORAL | 3 refills | Status: AC

## 2024-02-29 MED ORDER — OLMESARTAN MEDOXOMIL-HCTZ 20-12.5 MG PO TABS: 1.0000 | ORAL_TABLET | Freq: Every day | ORAL | 1 refills | Status: DC

## 2024-02-29 MED ORDER — SYNJARDY 12.5-1000 MG PO TABS: 1.0000 | ORAL_TABLET | Freq: Two times a day (BID) | ORAL | 2 refills | Status: AC

## 2024-02-29 NOTE — Progress Notes (Signed)
 02/29/2024 Name: Krista Sosa MRN: [id number] DOB: Mar 05, 1951  Chief Complaint  Patient presents with   Diabetes   Hypertension   Hyperlipidemia    Krista Sosa is a 73 y.o. year old female who presented for a face-to-face visit.   They were referred to the pharmacist by a quality report for assistance in managing diabetes. PMH includes HTN, ashtma, GERD with dysphagia, T2DM,   Patient was identified as falling into the [place] Measure - Diabetes.   Patient was: Referred to pharmacy for chronic disease management.    Subjective: Patient was last seen by PCP, Krista Chars, MD, on 11/24/23. At last visit, BP was elevated in clinic to 162/73 mmHg on repeat, but she had not taken her BP medication that AM. A1C had improved from 9.0% to 8.0%. She was encouraged to take Synjardy  twice daily and restart using her Freestyle Libre CGM. She was seen in clinic again on 02/04/24 for an acute visit for tinea pedis. BP remained elevated at 162/78 mmHg. She was engaged by pharmacy on 02/18/24. She reported that she did not have FL3+ sensors to go with her new FL3 reader. She was given an Rx for new sensors. She was instructed to continue her current DM regimen, but to take Humalog  with meals. Encouraged her to purchase a new BP cuff with her u-card. She was concerned about lack of follow-up on her thyroid  US  and I sent a message to her PCP for review. Patient was seen in clinic on 02/26/24 for pain. She was instructed to trial lidocaine  patches and cyclobenzaprine  5 mg at bedtime.  Today, patient reports doing well. She brings her medications with her along with her FL3+ sensors and reader, which she has not set up yet. She requests to get her flu shot today. Despite using cyclobenzaprine  and lidocaine  patches, she reports she continues to have pain, night sweats, and is not sleeping well. She continues to report poor adherence to her medications due to not knowing what they are for. Requests to  have this added to her medication labels.  Care Team: Primary Care Provider: Nooruddin, Saad, MD ; Next Scheduled Visit: Needs to be scheduled   Medication Access/Adherence  Current Pharmacy:  [facility] [phone] [address], [place] - [address] [place] [address] Phone: [phone] Fax: [phone]  [facility] - [place], [place] - [address] [place] [address] Phone: [phone] Fax: [phone]   Patient reports affordability concerns with their medications: No  Patient reports access/transportation concerns to their pharmacy: No - typically uses [facility] mail order [facility]  Patient reports adherence concerns with their medications:  No  - although Tresiba  has not been filled since Feb 2025 (for 150 ds)  Denies missed doses of insulin , but is not consistently taking her oral medications.  Diabetes:  Current medications: Tresiba  20 units daily, Humalog  12 units in the AM with breakfast and 15 units in the evening with dinner (reports taking 15 units TID with meals), Synjardy  (empagliflozin -metformin  IR) 12.09-998 mg BID (not taking regularly - unsure what it was for)  Medications tried in the past: glipizide , metformin  IR, pioglitazone , sitagliptin  (Januvia ) (was on DPP4i instead of GLP-1 due to aversion to needles), Janumet  (sitagliptin -metformin )  Current glucose readings: not currently monitoring. Brought FL3+ reader and sensors for application. She has a compatible smart phone [employer] app), but patient prefers to use reader device to monitor sugars.  Will re-discuss switching to smart phone at follow-up for easier monitoring.  Patient denies hypoglycemic s/sx including dizziness, shakiness Reports sweating. Patient denies hyperglycemic symptoms including polyphagia, nocturia, neuropathy, blurred vision. Reports occasional polydipisa, polyphagia.  Current meal patterns: 2-3  meals/day. Eats a lot of vegetables, baked meat. - Drinks: water, sweet tea (not every day, 1 bottle a couple times per week)  Current physical activity: did not discuss today  Current medication access support: [employer] Dual Complete  Hypertension:  Current medications: olmesartan -hydrochlorothiazide  20-12.5 mg daily (has not taken medications today, not taking every day due to not knowing what it was for) Medications previously tried n/a   Patient does not have a validated, automated, upper arm home BP cuff - reports she is able to use the arm cuff by herself. She would prefer a wrist cuff. She plans to use [facility] U card to buy one at the pharmacy.  Current blood pressure readings readings: n/a  Patient denies hypotensive s/sx including dizziness, lightheadedness.  Patient denies hypertensive symptoms including  chest pain, shortness of breath - reports she is having HA related to her neck pain.   Hyperlipidemia/ASCVD Risk Reduction  Current lipid lowering medications: atorvastatin  80 mg daily (not taking regularly due to not knowing what it was for)  Clinical ASCVD: No  The 10-year ASCVD risk score (Arnett DK, et al., 2019) is: 58.2%   Values used to calculate the score:     Age: 70 years     Clincally relevant sex: Female     Is Non-Hispanic African American: Yes     Diabetic: Yes     Tobacco smoker: Yes     Systolic Blood Pressure: 145 mmHg     Is BP treated: Yes     HDL Cholesterol: 54 mg/dL     Total Cholesterol: 216 mg/dL   Tobacco Abuse:  Tobacco Use History: Number of cigarettes per day 1 pack last her 3 days   Quit Attempt History: Most recent quit attempt - a long time ago, was not successful  Objective:  BP Readings from Last 3 Encounters:  02/29/24 (!) 145/72  02/26/24 (!) 146/79  02/04/24 (!) 162/78    Lab Results  Component Value Date   HGBA1C 9.1 (A) 02/29/2024   HGBA1C 8.0 (A) 11/24/2023   HGBA1C 9.0 (A) 08/20/2023       Latest Ref Rng & Units  08/20/2023    3:47 PM 03/04/2022    3:58 PM 08/06/2021    4:11 PM  BMP  Glucose 70 - 99 mg/dL 829  68  853   BUN 8 - 27 mg/dL 21  20  14    Creatinine 0.57 - 1.00 mg/dL 9.22  9.34  9.22   BUN/Creat Ratio 12 - 28 27  31  18    Sodium 134 - 144 mmol/L 139  139  139   Potassium 3.5 - 5.2 mmol/L 4.3  4.3  4.5   Chloride 96 - 106 mmol/L 102  104  106   CO2 20 - 29 mmol/L 21  22  20    Calcium  8.7 - 10.3 mg/dL 9.9  9.6  9.4     Lab Results  Component Value Date   CHOL 216 (H) 03/04/2022   HDL 54 03/04/2022   LDLCALC 136 (H) 03/04/2022   TRIG 148 03/04/2022   CHOLHDL 4.0 03/04/2022    Medications Reviewed Today     Reviewed by Brinda Lorain SQUIBB, RPH (Pharmacist) on 02/29/24 at 1523  Med List Status: <None>  Medication Order Taking? Sig Documenting Provider Last Dose Status Informant  albuterol  (VENTOLIN  HFA) 108 (90 Base) MCG/ACT inhaler [id number]  Inhale 1-2 puffs into the lungs every 4 (four) hours as needed for wheezing or shortness of breath (coughing fits). Use with spacer. Nooruddin, Saad, MD  Active   atorvastatin  (LIPITOR) 80 MG tablet [id number] Yes Take 1 tablet (80 mg total) by mouth daily. Nooruddin, Saad, MD  Active   clotrimazole -betamethasone  (LOTRISONE ) cream [id number]  APPLY TOPICALLY TO AFFECTED  AREA(S) TWICE DAILY Nooruddin, Saad, MD  Active   Continuous Glucose Receiver (FREESTYLE LIBRE 3 READER) DEVI [id number]  Use with Freestyle Libree 3 plus sensors to monitor your blood sugar at least 6 times a day Nooruddin, Roetta, MD  Active   Continuous Glucose Sensor (FREESTYLE LIBRE 3 PLUS SENSOR) MISC [id number]  Change sensor every 15 days. Nooruddin, Saad, MD  Active   cyclobenzaprine  (FLEXERIL ) 5 MG tablet [id number] Yes Take 1 tablet (5 mg total) by mouth at bedtime. D'Mello, Rosalyn, DO  Active   diclofenac  Sodium (VOLTAREN ) 1 % GEL [id number]  Apply 4 g topically 4 (four) times daily. Nooruddin, Saad, MD  Active   diphenhydrAMINE -zinc  acetate (BENADRYL  EXTRA STRENGTH) cream  [id number]  Apply 1 Application topically 3 (three) times daily as needed for itching. Harrie Bruckner, DO  Active   DULoxetine  (CYMBALTA ) 30 MG capsule [id number]  TAKE 2 CAPSULES BY MOUTH DAILY  Patient not taking: Reported on 02/29/2024   Nooruddin, Saad, MD  Active   insulin  degludec (TRESIBA  FLEXTOUCH) 100 UNIT/ML FlexTouch Pen [id number] Yes INJECT 25 UNITS INTO THE SKIN DAILY. Nooruddin, Saad, MD  Active   insulin  lispro (HUMALOG  KWIKPEN) 100 UNIT/ML KwikPen [id number] Yes INJECT SUBCUTANEOUSLY 12 UNITS  IN THE MORNING AND 15 UNITS IN  THE EVENING WITH LARGEST MEALS Leopold Damien NOVAK, MD  Active   Insulin  Pen Needle (BD PEN NEEDLE NANO ULTRAFINE) 32G X 4 MM MISC [id number]  Use as directed to inject insulin  4 times per day Nooruddin, Saad, MD  Active   lidocaine  (LIDODERM ) 5 % [id number]  Place 1 patch onto the skin daily for 10 days. Remove & Discard patch within 12 hours or as directed by MD D'Mello, Rosalyn, DO  Active   olmesartan -hydrochlorothiazide  (BENICAR  HCT) 20-12.5 MG tablet [id number]  TAKE 1 TABLET BY MOUTH DAILY  Patient not taking: Reported on 02/29/2024   Nooruddin, Saad, MD  Active   Skin Protectants, Misc. (EUCERIN) cream [id number]  Apply topically as needed for dry skin. Teofilo Satterfield, MD  Active   SYNJARDY  12.09-998 MG TABS [id number]  TAKE 1 TABLET BY MOUTH TWICE  DAILY  Patient not taking: Reported on 02/29/2024   Nooruddin, Saad, MD  Active   terbinafine  (LAMISIL ) 1 % cream [id number]  Apply 1 Application topically 2 (two) times daily. Harrie Bruckner, DO  Active   TRELEGY ELLIPTA  200-62.5-25 MCG/ACT AEPB [id number] Yes USE 1 INHALATION BY MOUTH DAILY Nooruddin, Saad, MD  Active   triamcinolone  (KENALOG ) 0.025 % ointment [id number]  APPLY TOPICALLY TWICE DAILY AS DIRECTED Nooruddin, Saad, MD  Active     Discontinued 04/18/13 1412 (Reorder)               Assessment/Plan:   Diabetes: - Currently uncontrolled with most recent A1C of 9.1% above goal <7%, and  worsened from 8.0% in the setting of not taking Synjardy  due to concerns about medication indications. Was able to set up Freestyle Libre 3+ CGM (and reader) today, which should also help improve glycemic control. Once more  BG data is available, can work on adjusting insulin  doses. Patient is not currently reporting and s/sx consistent with hypoglycemia. She may be a candidate for GLP-1 in the future, however, she has an outstanding order for a FNA of a thyroid  nodule, so will await these results (requested follow-up on referral today). - Last UACR May 2020 - 185 mg/g - obtained UACR today - Reviewed long term cardiovascular and renal outcomes of uncontrolled blood sugar - Reviewed goal A1c, goal fasting, and goal 2 hour post prandial glucose - Reviewed hypoglycemia management plan and the rule of 15 - Recommend to continue Tresiba  20 units once daily - Recommend to continue Humalog  15 units TID WITH meals. Advised to inject 15-20 minutes before eating.   - Recommend to restart Synjardy  (empagliflozin -metformin ) 12.09-998 mg BID - Recommend to check glucose continuously using FL3+ CGM. Patient currently prefers to use reader. Downloaded [employer] on her smart phone today, but did not create profile or connect to LibreView. Will revisit option of using remote monitoring at follow-up.  - Next A1C due 06/01/23    Hypertension: - Currently uncontrolled with clinic BP consistently above goal less than 130/80 in the setting of medication nonadherence. Patient is not having s/sx of hypo- or hyper-tension. If next clinic BP remains uncontrolled with daily adherence, should escalate dose of combination ARB-thiazide pill.  - Reviewed long term cardiovascular and renal outcomes of uncontrolled blood pressure - Reviewed appropriate blood pressure monitoring technique and reviewed goal blood pressure. Recommended to check home blood pressure and heart rate  once daily and keep a log to bring to upcoming  appointments - advised her again that she can purchase wrist cuff (for ease of use) with her U card at a local pharmacy - Recommend to continue olmesartan -hydrochlorothiazide  20-12.5 mg daily - Advised patient to take her BP medication as scheduled prior to her next appt    Hyperlipidemia/ASCVD Risk Reduction: - Currently uncontrolled with most recent LDL-C of 136 mg/dL above goal < 70 mg/dL given U7IF + comorbidities. High intensity statin indicated. Obtained repeat lipid panel today, though expect LDL-C to be elevated as patient reports inconsistent adherence to medications. - Reviewed long term complications of uncontrolled cholesterol - Recommend to continue atorvastatin  80 mg daily - Obtained repeat lipid panel today - Previously advised patient to consider initiating therapy for smoking cessation. Did not discuss again today due to current concerns with adherence, but will discuss at follow-up.   As requested by patient, wrote out indications for medications on her medication bottles. Will collaborate with PCP to send refills of maintenance medications to pharmacy with indications written out on the labels. Called [facility] to ensure that they fill medications with indication in the sig when refills are due.   Patient verbalized understanding of treatment plan. Was provided written instructions at conclusion of visit.  Follow Up Plan:  Pharmacist in person 05/02/24 PCP clinic visit - scheduled today for 03/28/24   Lorain Baseman, PharmD [facility] [phone]

## 2024-02-29 NOTE — Patient Instructions (Addendum)
 It was nice to see you today!  Your goal blood sugar is 80-130 before eating and less than 180 after eating. Your goal blood pressure is less than 130/80 mmHg.  Medication Changes:  Take your medications as prescribed every day. You have two bottles of Synjardy  - you only need to take medication from one bottle at a time. The dose is one tablet twice daily.  Please also take your cholesterol and blood pressure medication every day.   Let me know if you have any issues with your sensor  Monitor blood sugars at home and keep a log (glucometer or piece of paper) to bring with you to your next visit.  Keep up the good work with diet and exercise. Aim for a diet full of vegetables, fruit and lean meats (chicken, [place], fish). Try to limit salt intake by eating fresh or frozen vegetables (instead of canned), rinse canned vegetables prior to cooking and do not add any additional salt to meals.   Lorain Baseman, PharmD [facility] [phone]

## 2024-03-01 ENCOUNTER — Ambulatory Visit: Payer: Self-pay

## 2024-03-02 LAB — LIPID PANEL
Chol/HDL Ratio: 3.8 ratio (ref 0.0–4.4)
Cholesterol, Total: 191 mg/dL (ref 100–199)
HDL: 50 mg/dL (ref >39)
LDL Chol Calc (NIH): 118 mg/dL — ABNORMAL HIGH (ref 0–99)
Triglycerides: 131 mg/dL (ref 0–149)
VLDL Cholesterol Cal: 23 mg/dL (ref 5–40)

## 2024-03-02 LAB — MICROALBUMIN / CREATININE URINE RATIO
Creatinine, Urine: 158.9 mg/dL
Microalb/Creat Ratio: 1493 mg/g{creat} — AB (ref 0–29)
Microalbumin, Urine: 2372.7 ug/mL

## 2024-03-02 LAB — LDL CHOLESTEROL, DIRECT: LDL Direct: 119 mg/dL — ABNORMAL HIGH (ref 0–99)

## 2024-03-06 NOTE — Progress Notes (Signed)
 Internal Medicine Clinic Attending  I was physically present during the key portions of the resident provided service and participated in the medical decision making of patient's management care. I reviewed pertinent patient test results.  The assessment, diagnosis, and plan were formulated together and I agree with the documentation in the resident's note. Definite trigger points in  trapezius;. If PT insufficient, she could be referred to PMR for trigger point injections.   Trudy Mliss Dragon, MD

## 2024-03-11 ENCOUNTER — Other Ambulatory Visit: Payer: Self-pay | Admitting: Student

## 2024-03-11 DIAGNOSIS — E1129 Type 2 diabetes mellitus with other diabetic kidney complication: Secondary | ICD-10-CM

## 2024-03-14 ENCOUNTER — Telehealth: Payer: Self-pay | Admitting: *Deleted

## 2024-03-14 NOTE — Telephone Encounter (Unsigned)
 Copied from CRM [id number]. Topic: Referral - Question >> Mar 11, 2024 12:17 PM Graeme ORN wrote: Reason for CRM: Krista Sosa called from [facility] with questions about referral received. Confirm it is for Nodule number 4 make sure it should be for thyroid  biopsy recommended by previous ultrasound results. Direct Phone: [phone]

## 2024-03-15 ENCOUNTER — Other Ambulatory Visit: Payer: Self-pay

## 2024-03-15 DIAGNOSIS — E041 Nontoxic single thyroid nodule: Secondary | ICD-10-CM

## 2024-03-19 ENCOUNTER — Other Ambulatory Visit: Payer: Self-pay | Admitting: Internal Medicine

## 2024-03-24 ENCOUNTER — Ambulatory Visit: Attending: Internal Medicine

## 2024-03-28 ENCOUNTER — Ambulatory Visit: Admitting: Student

## 2024-03-28 NOTE — Progress Notes (Signed)
 Krista Sosa                                          MRN: [id number]   03/28/2024   The [facility] Quality Team Specialist reviewed this patient medical record for the purposes of chart review for care gap closure. The following were reviewed: chart review for care gap closure-glycemic status assessment.    [facility] Quality Team

## 2024-03-31 ENCOUNTER — Encounter: Payer: Self-pay | Admitting: Student

## 2024-03-31 ENCOUNTER — Ambulatory Visit (INDEPENDENT_AMBULATORY_CARE_PROVIDER_SITE_OTHER): Admitting: Student

## 2024-03-31 VITALS — BP 179/78 | HR 99 | Temp 98.3°F | Ht 66.0 in | Wt 185.4 lb

## 2024-03-31 DIAGNOSIS — I10 Essential (primary) hypertension: Secondary | ICD-10-CM

## 2024-03-31 DIAGNOSIS — J4520 Mild intermittent asthma, uncomplicated: Secondary | ICD-10-CM | POA: Diagnosis not present

## 2024-03-31 DIAGNOSIS — Z79899 Other long term (current) drug therapy: Secondary | ICD-10-CM

## 2024-03-31 DIAGNOSIS — E049 Nontoxic goiter, unspecified: Secondary | ICD-10-CM

## 2024-03-31 DIAGNOSIS — E1165 Type 2 diabetes mellitus with hyperglycemia: Secondary | ICD-10-CM

## 2024-03-31 DIAGNOSIS — Z720 Tobacco use: Secondary | ICD-10-CM

## 2024-03-31 DIAGNOSIS — Z794 Long term (current) use of insulin: Secondary | ICD-10-CM

## 2024-03-31 DIAGNOSIS — Z7951 Long term (current) use of inhaled steroids: Secondary | ICD-10-CM

## 2024-03-31 DIAGNOSIS — F17210 Nicotine dependence, cigarettes, uncomplicated: Secondary | ICD-10-CM | POA: Diagnosis not present

## 2024-03-31 DIAGNOSIS — H35033 Hypertensive retinopathy, bilateral: Secondary | ICD-10-CM

## 2024-03-31 DIAGNOSIS — E1129 Type 2 diabetes mellitus with other diabetic kidney complication: Secondary | ICD-10-CM

## 2024-03-31 DIAGNOSIS — F330 Major depressive disorder, recurrent, mild: Secondary | ICD-10-CM

## 2024-03-31 LAB — GLUCOSE, CAPILLARY: Glucose-Capillary: 156 mg/dL — ABNORMAL HIGH (ref 70–99)

## 2024-03-31 MED ORDER — INSULIN LISPRO (1 UNIT DIAL) 100 UNIT/ML (KWIKPEN): PEN_INJECTOR | SUBCUTANEOUS | 2 refills | Status: AC

## 2024-03-31 MED ORDER — OLMESARTAN MEDOXOMIL-HCTZ 40-12.5 MG PO TABS
1.0000 | ORAL_TABLET | Freq: Every day | ORAL | 3 refills | Status: AC
Start: 2024-03-31 — End: ?

## 2024-03-31 MED ORDER — TRESIBA FLEXTOUCH 100 UNIT/ML ~~LOC~~ SOPN: 24.0000 [IU] | PEN_INJECTOR | Freq: Every day | SUBCUTANEOUS | 3 refills | Status: AC

## 2024-03-31 NOTE — Assessment & Plan Note (Signed)
 Has an appointment set for IR in November 21 for biopsy

## 2024-03-31 NOTE — Assessment & Plan Note (Signed)
 Still has not quit smoking, uses about 10 cigarettes a day.  Chantix she has tried before however was having hallucinations and side effects.  I discussed with her that the next time she does come to the clinic she should be down to 7 or 8 cigarettes daily, for which she is agreeable.  I spent 5 minutes discussing this with her.

## 2024-03-31 NOTE — Assessment & Plan Note (Signed)
 Blood pressure remains elevated despite current Benicar  dosage. - Increased Benicar  dosage to 40/25 - Follow up in one month for BMP - May need additional agent like amlodipine

## 2024-03-31 NOTE — Assessment & Plan Note (Signed)
 Recommend using over-the-counter Claritin as needed

## 2024-03-31 NOTE — Patient Instructions (Signed)
 Thank you so much for coming to the clinic today!   We are increasing the Tresiba  to 24 units instead of 20  I have refilled the novolog   I have also increased your blood pressure medication.   If you have any questions please feel free to the call the clinic at anytime at [phone]. It was a pleasure seeing you!  Best, Dr. Malashia Kamaka

## 2024-03-31 NOTE — Progress Notes (Signed)
 CC: Chronic condition follow-up  HPI:  [name] is a 73 y.o. female living with a history stated below and presents today for chronic condition follow-up. Please see problem based assessment and plan for additional details.  Discussed the use of AI scribe software for clinical note transcription with the patient, who gave verbal consent to proceed.  History of Present Illness Krista Sosa is a 73 year old female with diabetes who presents with fluctuating blood sugar levels.  She has been experiencing fluctuating blood sugar levels, with readings ranging from 68 to 255 mg/dL. Her blood sugar is often around 160 mg/dL upon waking and can drop to 68 mg/dL before bed. She experienced a low of 68 mg/dL just before the visit and managed it by consuming candy. She is currently taking Tresiba  20 units in the morning, Humalog  12 units in the morning and 15 units with dinner, and Synjardy  twice a day. She recently saw a pharmacist who set up a meter for her, but she forgot to bring it to the appointment.  She mentions needing to see a kidney specialist and has an upcoming biopsy scheduled in November. She received a letter regarding this but did not bring it to the appointment.  Her blood pressure has been high, and she does not currently have a device to monitor it at home. She is taking Benicar , which she has not taken today, and believes she needs a refill. She is also on cetorvastatin.  She smokes about ten cigarettes a day and finds it difficult to quit. She has tried Chantix in the past but experienced hallucinations. She is also taking duloxetine , which she refers to as 'that blue pill'.  She sometimes misses doses of Trelegy and left her phone and meter at home, which she usually carries with her.    Past Medical History:  Diagnosis Date   Allergy    Asthma, chronic 06/09/2006   Bilateral cataracts 05/07/2016   s/p left cataract extraction 10/02/2016 and right cataract  extraction 10/16/2016   Bronchiectasis without complication (HCC) 05/13/2018   Mild left lower lobe bronchiectasis seen on CT scan of the thorax in December 2019   Cataract    bil eyes   Esophageal dysphagia 03/05/2022   Essential hypertension 02/12/2018   Gastroesophageal reflux disease 08/13/2011   Glaucoma    Per patient report   Hx of adenomatous colonic polyps 11/16/2008   Hypertension    pt said she is supposed to take med but she is not taking it.   Hypertensive retinopathy, grade 2, bilateral 05/07/2016   Inflamed epidermoid cyst of skin 07/02/2020   Latent tuberculosis    dx 03/17/08 - tx with INH X 9 months   Lichenoid dermatitis 02/13/2012   Previously treated with Triamcinolone  0.025% cream Q8-12H PRN    Migraine headache 08/06/2018   Osteoarthritis, generalized 05/21/2006   Diffuse: cervical spine, right shoulder, left wrist Ineffective medications: Tylenol , ASA, Naprosyn , Ibuprofen , Meloxicam      Overweight (BMI 25.0-29.9) 02/13/2012   Perennial allergic rhinitis with seasonal variation 06/09/2006   Persistent microalbuminuria associated with type II diabetes mellitus (HCC) 05/21/2006   Radiculopathy affecting upper extremity 01/02/2019   Sickle cell anemia (HCC)    pt has sickle cell trait per pt   Sickle cell trait 02/13/2012   Smoker    1/2ppd   Split ear lobe 01/04/2019   Tobacco abuse disorder 06/09/2006   Tubular adenoma of rectum 11/16/2008   5 mm, excised endoscopically 11/16/2008, repeat colonoscopy  due July 2015    Ulnar neuropathy of right upper extremity 02/14/2016   Urinary tract disease    JICP   Urticaria     Current Outpatient Medications on File Prior to Visit  Medication Sig Dispense Refill   albuterol  (VENTOLIN  HFA) 108 (90 Base) MCG/ACT inhaler Inhale 1-2 puffs into the lungs every 4 (four) hours as needed for wheezing or shortness of breath (coughing fits). Use with spacer. 1 each 1   atorvastatin  (LIPITOR) 80 MG tablet Take 1 tablet (80 mg  total) by mouth daily. For cholesterol. 90 tablet 3   clotrimazole -betamethasone  (LOTRISONE ) cream APPLY TOPICALLY TO AFFECTED  AREA(S) TWICE DAILY 45 g 2   Continuous Glucose Receiver (FREESTYLE LIBRE 3 READER) DEVI Use with Freestyle Libree 3 plus sensors to monitor your blood sugar at least 6 times a day 1 each 1   Continuous Glucose Sensor (FREESTYLE LIBRE 3 PLUS SENSOR) MISC Change sensor every 15 days. 6 each 3   cyclobenzaprine  (FLEXERIL ) 5 MG tablet Take 1 tablet (5 mg total) by mouth at bedtime. 30 tablet 0   diclofenac  Sodium (VOLTAREN ) 1 % GEL Apply 4 g topically 4 (four) times daily. 150 g 2   diphenhydrAMINE -zinc  acetate (BENADRYL  EXTRA STRENGTH) cream Apply 1 Application topically 3 (three) times daily as needed for itching. 28.4 g 0   DULoxetine  (CYMBALTA ) 30 MG capsule Take 2 capsules (60 mg total) by mouth daily. For pain. 90 capsule 3   Empagliflozin -metFORMIN  HCl (SYNJARDY ) 12.09-998 MG TABS Take 1 tablet by mouth 2 (two) times daily. For diabetes. 200 tablet 2   Insulin  Pen Needle (BD PEN NEEDLE NANO ULTRAFINE) 32G X 4 MM MISC Use as directed to inject insulin  4 times per day 400 each 3   Skin Protectants, Misc. (EUCERIN) cream Apply topically as needed for dry skin. 454 g 1   terbinafine  (LAMISIL ) 1 % cream Apply 1 Application topically 2 (two) times daily. 30 g 0   TRELEGY ELLIPTA  200-62.5-25 MCG/ACT AEPB USE 1 INHALATION BY MOUTH DAILY 180 each 3   triamcinolone  (KENALOG ) 0.025 % ointment APPLY TOPICALLY TWICE DAILY AS DIRECTED 30 g 3   [DISCONTINUED] Triamcinolone  Acetonide (TRIAMCINOLONE  0.1 % CREAM : EUCERIN) CREA Apply 1 application topically 2 (two) times daily as needed. 454 each 3   No current facility-administered medications on file prior to visit.    Family History  Problem Relation Age of Onset   Diabetes Mother    Heart disease Mother 21   Colon cancer Mother    Cancer Mother        Colon   Cancer Father        Lung   Sickle cell anemia Sister     Diabetes Sister    Brain cancer Sister    Cancer Sister        brain   Healthy Sister    Healthy Sister    Healthy Sister    Sickle cell anemia Brother    Lung cancer Brother    Cancer Brother        Lung   Breast cancer Maternal Aunt    Wilson's disease Maternal Uncle    Healthy Daughter    Healthy Daughter    Healthy Daughter    Healthy Son    Healthy Son    Breast cancer Other    Esophageal cancer Neg Hx    Rectal cancer Neg Hx    Stomach cancer Neg Hx    Colon polyps Neg Hx  Social History   Socioeconomic History   Marital status: Single    Spouse name: Not on file   Number of children: Not on file   Years of education: Not on file   Highest education level: 11th grade  Occupational History   Occupation: Retired    Comment: Dispensing optician   Occupation: Retired    Comment: [employer]  Tobacco Use   Smoking status: Every Day    Current packs/day: 0.30    Average packs/day: 0.3 packs/day for 25.0 years (7.5 ttl pk-yrs)    Types: Cigarettes   Smokeless tobacco: Never   Tobacco comments:    .3 ppd  Vaping Use   Vaping status: Never Used  Substance and Sexual Activity   Alcohol use: Yes    Alcohol/week: 0.0 standard drinks of alcohol    Comment: occasionally   Drug use: No    Comment: positive marijuana metabolite in 2010   Sexual activity: Not Currently    Birth control/protection: Other-see comments, Post-menopausal    Comment: s/p tubal ligation  Other Topics Concern   Not on file  Social History Narrative   Current Social History 09/05/2020       Patient lives alone in a ground floor apartment. There is one step down without handrails to the entrance the patient uses.       Patient's method of transportation is personal car.      The highest level of education was 11 th grade      The patient currently retired from [employer], and the [employer].      Identified important Relationships are Lucie and Kathi       Pets : Eddy Deem       Interests / Fun: Walking at the park       Current Stressors: I have no stress; I'm good       Religious / Personal Beliefs: Holiness       L. Ducatte, BSN, RN-BC    Social Drivers of Health   Financial Resource Strain: Low Risk  (07/29/2023)   Overall Financial Resource Strain (CARDIA)    Difficulty of Paying Living Expenses: Not hard at all  Food Insecurity: Patient Declined (07/29/2023)   Hunger Vital Sign    Worried About Running Out of Food in the Last Year: Patient declined    Ran Out of Food in the Last Year: Patient declined  Transportation Needs: No Transportation Needs (07/29/2023)   PRAPARE - Administrator, Civil Service (Medical): No    Lack of Transportation (Non-Medical): No  Physical Activity: Inactive (07/29/2023)   Exercise Vital Sign    Days of Exercise per Week: 0 days    Minutes of Exercise per Session: 0 min  Stress: No Stress Concern Present (07/29/2023)   [employer] of Occupational Health - Occupational Stress Questionnaire    Feeling of Stress : Only a little  Social Connections: Unknown (07/29/2023)   Social Connection and Isolation Panel    Frequency of Communication with Friends and Family: More than three times a week    Frequency of Social Gatherings with Friends and Family: Patient declined    Attends Religious Services: Patient declined    Database Administrator or Organizations: No    Attends Banker Meetings: Never    Marital Status: Never married  Intimate Partner Violence: Not At Risk (07/29/2023)   Humiliation, Afraid, Rape, and Kick questionnaire    Fear of Current or Ex-Partner:  No    Emotionally Abused: No    Physically Abused: No    Sexually Abused: No    Review of Systems: ROS negative except for what is noted on the assessment and plan.  Vitals:   03/31/24 1358  BP: (!) 179/78  Pulse: 99  Temp: 98.3 F (36.8 C)  TempSrc: Oral  SpO2: 99%  Weight: 185 lb 6.4 oz (84.1 kg)  Height:  5' 6 (1.676 m)    Physical Exam: Constitutional: well-appearing female in no acute distress HENT: normocephalic atraumatic, mucous membranes moist Eyes: conjunctiva non-erythematous Neck: supple Cardiovascular: regular rate and rhythm, no m/r/g Pulmonary/Chest: normal work of breathing on room air, lungs clear to auscultation bilaterally   Assessment & Plan:   Uncontrolled type 2 diabetes mellitus with hyperglycemia, with long-term current use of insulin  (HCC) Fluctuating glucose levels with episodes of hypoglycemia and hyperglycemia. Imbalance between long-acting and short-acting insulin  noted.  A1c worsened from 8.0 to 9.1 in October.  At her visit with Dr. Brinda, she was not interested in using her phone as the receiver, she is interested now however she forgot her phone with her.  It is compatible. - Increased Tresiba  to 24 units daily. - Continue Humalog  and Synjardy . - Ensure availability of glucose meter and phone for monitoring.  Goiter Has an appointment set for IR in November 21 for biopsy  Hypertensive retinopathy, grade 2, bilateral Referral made to ophthalmology  Major depressive disorder, recurrent episode, mild with anxious distress Stable with her Cymbalta .  Mild intermittent asthma Recommend using over-the-counter Claritin as needed  Tobacco abuse disorder Still has not quit smoking, uses about 10 cigarettes a day.  Chantix she has tried before however was having hallucinations and side effects.  I discussed with her that the next time she does come to the clinic she should be down to 7 or 8 cigarettes daily, for which she is agreeable.  I spent 5 minutes discussing this with her.  Essential hypertension Blood pressure remains elevated despite current Benicar  dosage. - Increased Benicar  dosage to 40/25 - Follow up in one month for BMP - May need additional agent like amlodipine    Patient discussed with Dr. E. Hoffman    Darshay Deupree, M.D. [facility]  Health Internal Medicine, PGY-3 Pager: [phone] Date 03/31/2024 Time 2:41 PM

## 2024-03-31 NOTE — Assessment & Plan Note (Signed)
 Stable with her Cymbalta .

## 2024-03-31 NOTE — Assessment & Plan Note (Signed)
Referral made to ophthalmology

## 2024-03-31 NOTE — Assessment & Plan Note (Signed)
 Fluctuating glucose levels with episodes of hypoglycemia and hyperglycemia. Imbalance between long-acting and short-acting insulin  noted.  A1c worsened from 8.0 to 9.1 in October.  At her visit with Dr. Brinda, she was not interested in using her phone as the receiver, she is interested now however she forgot her phone with her.  It is compatible. - Increased Tresiba  to 24 units daily. - Continue Humalog  and Synjardy . - Ensure availability of glucose meter and phone for monitoring.

## 2024-04-11 NOTE — Progress Notes (Signed)
 Internal Medicine Clinic Attending  Case discussed with the resident at the time of the visit.  We reviewed the resident's history and exam and pertinent patient test results.  I agree with the assessment, diagnosis, and plan of care documented in the resident's note.

## 2024-04-15 ENCOUNTER — Other Ambulatory Visit (HOSPITAL_COMMUNITY)
Admission: RE | Admit: 2024-04-15 | Discharge: 2024-04-15 | Disposition: A | Discharge status: Home / Self Care | Source: Ambulatory Visit

## 2024-04-15 ENCOUNTER — Ambulatory Visit
Admission: RE | Admit: 2024-04-15 | Discharge: 2024-04-15 | Disposition: A | Discharge status: Home / Self Care | Source: Ambulatory Visit

## 2024-04-15 DIAGNOSIS — E041 Nontoxic single thyroid nodule: Secondary | ICD-10-CM

## 2024-04-15 NOTE — Progress Notes (Signed)
 Patient presented today to [facility] for an image-guided FNA of a left mid thyroid  nodule and believed the procedure would be performed with moderate sedation. After a lengthy discussion with the patient in which I shared this procedure is done with local anesthesia the patient was agreeable to try but was unsure if she would be able to tolerate the procedure.   The patient required 2-3 times the amount of lidocaine  used in a typical procedure. After the patient was sufficiently anesthetized and verbally acknowledged she felt nothing sharp/painful I was able to obtain two of the five samples. After the second sample the patient requested that I stop the procedure.   The two samples we obtained will be sent for pathology. Patient aware if pathology returns non-diagnostic the ordering provider may request a repeat biopsy. Patient encouraged to request a prescription for a one time dose of oral Valium or similar drug to take pre-procedure. Patient verbalized understanding.   Warren Dais, AGACNP-BC 04/15/2024, 2:59 PM

## 2024-04-19 LAB — CYTOLOGY - NON PAP

## 2024-05-02 ENCOUNTER — Ambulatory Visit

## 2024-05-05 ENCOUNTER — Ambulatory Visit

## 2024-05-05 ENCOUNTER — Telehealth: Payer: Self-pay | Admitting: Student

## 2024-05-05 NOTE — Telephone Encounter (Signed)
 Appt has been cancelled. The patient will call back and resch at a later date  Copied from CRM #[id number]. Topic: Appointments - Appointment Cancel/Reschedule >> May 05, 2024 12:04 PM Marda MATSU wrote: Patient Krista Sosa would like a call back to reschedule her pharmacy appt.   Please advise

## 2024-05-06 ENCOUNTER — Telehealth: Payer: Self-pay | Admitting: *Deleted

## 2024-05-06 NOTE — Telephone Encounter (Signed)
 Received call from pt's insurance [employer]) that tresiba  will no longer be available for the 2026 plan year.  States the alternatives will be Toujeo  or Lantus .  States pt is aware and still has Tresiba  at this time.  CMA  contact pt with an appt to discuss medication changes Appt given for 12/17 @ 1415

## 2024-05-10 ENCOUNTER — Telehealth: Payer: Self-pay

## 2024-05-10 NOTE — Progress Notes (Signed)
 Patient appearing on quality report for failing TNM Diabetes with last A1C > 9.1%. Patient presenting to PCP this week. She does use a Libre sensor and reader, so we can upload updated information at glycemic control at upcoming appt.   Called patient to confirm [facility] PCP appt on 05/11/24 to discuss transition from Tresiba  to insurance preferred alternative Lantus  or Toujeo  U300 in 2026. Patient states she needs to reschedule this appt. Rescheduled for 05/12/24.   Encouraged patient to bring [facility] reader with her to appointment so we can download data and adjust regimen as needed. Patient agreeable to plan. I will be in clinic this day and can see her following PCP visit.  Notes she only has 2 or 3 Humalog  pens remaining. Per dispense report, Humalog  was shipped for a 112 day supply by [facility] on 03/31/24. Patient states she did not receive this medication. Called [facility] who confirmed medication was delivered 04/02/24 at 9:43 AM. Has picture of it delivered next to a flower pot. Will follow-up with patient in clinic on 05/12/24.  She confirms she did receive recent shipment of Tresiba  (dispensed 150ds on 02/29/24). She will not need to transition to insurance preferred alternative Lantus  until ~Feb 2026.   Lorain Baseman, PharmD [facility] [phone]

## 2024-05-11 ENCOUNTER — Ambulatory Visit: Payer: Self-pay

## 2024-05-12 ENCOUNTER — Ambulatory Visit

## 2024-05-31 NOTE — Progress Notes (Signed)
 Krista Sosa                                          MRN: [id number]   05/31/2024   The VBCI Quality Team Specialist reviewed this patient medical record for the purposes of chart review for care gap closure. The following were reviewed: chart review for care gap closure-glycemic status assessment.    VBCI Quality Team

## 2024-06-08 NOTE — Progress Notes (Signed)
 Krista Sosa                                          MRN: [id number]   06/08/2024   The VBCI Quality Team Specialist reviewed this patient medical record for the purposes of chart review for care gap closure. The following were reviewed: abstraction for care gap closure-glycemic status assessment.    VBCI Quality Team

## 2024-06-09 ENCOUNTER — Telehealth: Payer: Self-pay | Admitting: *Deleted

## 2024-06-09 NOTE — Progress Notes (Signed)
 Complex Care Management Care Guide Note  06/09/2024 Name: Krista Sosa MRN: [id number] DOB: 1951-02-14  Krista Sosa is a 74 y.o. year old female who is a primary care patient of Nooruddin, Saad, MD and is actively engaged with the care management team. I reached out to Dickey JINNY Croft by phone today to assist with re-scheduling  with the Pharmacist.  Follow up plan: Unsuccessful telephone outreach attempt made. A HIPAA compliant phone message was left for the patient providing contact information and requesting a return call.  Harlene Satterfield  [facility]  [facility], [facility] Guide  Direct Dial : [phone]  Fax [phone]

## 2024-06-13 NOTE — Progress Notes (Signed)
 Complex Care Management Care Guide Note  06/13/2024 Name: LOYS HOSELTON MRN: [id number] DOB: 1951/01/22  AMARISS DETAMORE is a 74 y.o. year old female who is a primary care patient of Nooruddin, Saad, MD and is actively engaged with the care management team. I reached out to Dickey JINNY Croft by phone today to assist with re-scheduling  with the Pharmacist.  Follow up plan: Face to Face appointment with complex care management team member scheduled for:  2/12 at 2:00 PM   Harlene Satterfield  [facility], [facility] Guide  Direct Dial : [phone]  Fax [phone]

## 2024-06-14 ENCOUNTER — Telehealth: Payer: Self-pay | Admitting: Pharmacy Technician

## 2024-06-14 NOTE — Progress Notes (Signed)
" ° °  06/14/2024 Name: Krista Sosa MRN: [id number] DOB: Sep 11, 1950  Patient is appearing for a follow-up visit with the population health pharmacy technician. Last engaged with the clinical pharmacist to discuss diabetes on 05/10/2024. Contacted pharmacy today to discuss medication access.   Plan from last clinical pharmacist appointment:  -Patient appearing on quality report for failing TNM Diabetes with last A1C > 9.1%. Patient presenting to PCP this week. She does use a Libre sensor and reader, so we can upload updated information at glycemic control at upcoming appt.  -Called patient to confirm [facility] PCP appt on 05/11/24 to discuss transition from Tresiba  to insurance preferred alternative Lantus  or Toujeo  U300 in 2026. Patient states she needs to reschedule this appt. Rescheduled for 05/12/24.  -Encouraged patient to bring [facility] reader with her to appointment so we can download data and adjust regimen as needed. Patient agreeable to plan. I will be in clinic this day and can see her following PCP visit. -Notes she only has 2 or 3 Humalog  pens remaining. Per dispense report, Humalog  was shipped for a 112 day supply by [facility] on 03/31/24. Patient states she did not receive this medication. Called [facility] who confirmed medication was delivered 04/02/24 at 9:43 AM. Has picture of it delivered next to a flower pot. Will follow-up with patient in clinic on 05/12/24. -She confirms she did receive recent shipment of Tresiba  (dispensed 150ds on 02/29/24). She will not need to transition to insurance preferred alternative Lantus  until ~Feb 2026.(copy/paste from last note)   Medication Adherence Barriers Identified:   Access issues with any new medication or testing device: Yes FreeStyle 3+ Sensors   Medication Adherence Barriers Addressed/Actions Taken:  Reviewed medication changes per plan from last clinical pharmacist note Medication Access for FreeStyle 3+ sensors Will discuss medication access  concerns with pharmacist Contacted pharmacy regarding refill Spoke to [employer], Pharmacist at [place] [facility] delivery Per Ardelia, the Sensor prescription has refills on it and he will prepare it for delivery Per Prash, patient should receive the sensors in 3-5 business days Will outreach patient in a few weeks to inquire if they were received and to check on blood sugar number per pharmacist request.  Next clinical pharmacist appointment is scheduled for: 07/07/2024  Kate Caddy, CPhT [facility] Office: [phone] Email: [email or web address]   "

## 2024-06-15 ENCOUNTER — Other Ambulatory Visit: Payer: Self-pay | Admitting: Student

## 2024-06-16 NOTE — Telephone Encounter (Signed)
 Medication sent to pharmacy

## 2024-06-27 ENCOUNTER — Telehealth: Payer: Self-pay | Admitting: Pharmacy Technician

## 2024-07-07 ENCOUNTER — Ambulatory Visit

## 2024-08-03 ENCOUNTER — Ambulatory Visit
